# Patient Record
Sex: Female | Born: 1940
Health system: Southern US, Community
[De-identification: ages and names within clinical notes are randomized; demographics above are authoritative.]

## PROBLEM LIST (undated history)

## (undated) ENCOUNTER — Emergency Department (HOSPITAL_COMMUNITY): Admission: EM | Payer: Medicare Other | Source: Home / Self Care

## (undated) DIAGNOSIS — Z86010 Personal history of colonic polyps: Secondary | ICD-10-CM

## (undated) DIAGNOSIS — G4730 Sleep apnea, unspecified: Secondary | ICD-10-CM

## (undated) DIAGNOSIS — I499 Cardiac arrhythmia, unspecified: Secondary | ICD-10-CM

## (undated) DIAGNOSIS — R32 Unspecified urinary incontinence: Secondary | ICD-10-CM

## (undated) DIAGNOSIS — R51 Headache: Secondary | ICD-10-CM

## (undated) DIAGNOSIS — D649 Anemia, unspecified: Secondary | ICD-10-CM

## (undated) DIAGNOSIS — I509 Heart failure, unspecified: Secondary | ICD-10-CM

## (undated) DIAGNOSIS — E739 Lactose intolerance, unspecified: Secondary | ICD-10-CM

## (undated) DIAGNOSIS — G629 Polyneuropathy, unspecified: Secondary | ICD-10-CM

## (undated) DIAGNOSIS — E785 Hyperlipidemia, unspecified: Secondary | ICD-10-CM

## (undated) DIAGNOSIS — E119 Type 2 diabetes mellitus without complications: Secondary | ICD-10-CM

## (undated) DIAGNOSIS — E669 Obesity, unspecified: Secondary | ICD-10-CM

## (undated) DIAGNOSIS — M8580 Other specified disorders of bone density and structure, unspecified site: Secondary | ICD-10-CM

## (undated) DIAGNOSIS — M1990 Unspecified osteoarthritis, unspecified site: Secondary | ICD-10-CM

## (undated) DIAGNOSIS — M109 Gout, unspecified: Secondary | ICD-10-CM

## (undated) DIAGNOSIS — I7121 Aneurysm of the ascending aorta, without rupture: Secondary | ICD-10-CM

## (undated) DIAGNOSIS — Z9289 Personal history of other medical treatment: Secondary | ICD-10-CM

## (undated) DIAGNOSIS — G43909 Migraine, unspecified, not intractable, without status migrainosus: Secondary | ICD-10-CM

## (undated) DIAGNOSIS — H353190 Nonexudative age-related macular degeneration, unspecified eye, stage unspecified: Secondary | ICD-10-CM

## (undated) DIAGNOSIS — Z8701 Personal history of pneumonia (recurrent): Secondary | ICD-10-CM

## (undated) DIAGNOSIS — I2699 Other pulmonary embolism without acute cor pulmonale: Secondary | ICD-10-CM

## (undated) DIAGNOSIS — I10 Essential (primary) hypertension: Secondary | ICD-10-CM

## (undated) DIAGNOSIS — J189 Pneumonia, unspecified organism: Secondary | ICD-10-CM

## (undated) DIAGNOSIS — Z9889 Other specified postprocedural states: Secondary | ICD-10-CM

## (undated) DIAGNOSIS — K5100 Ulcerative (chronic) pancolitis without complications: Secondary | ICD-10-CM

## (undated) DIAGNOSIS — I480 Paroxysmal atrial fibrillation: Secondary | ICD-10-CM

## (undated) DIAGNOSIS — R0600 Dyspnea, unspecified: Secondary | ICD-10-CM

## (undated) DIAGNOSIS — R0602 Shortness of breath: Secondary | ICD-10-CM

## (undated) DIAGNOSIS — R112 Nausea with vomiting, unspecified: Secondary | ICD-10-CM

## (undated) DIAGNOSIS — I82409 Acute embolism and thrombosis of unspecified deep veins of unspecified lower extremity: Secondary | ICD-10-CM

## (undated) DIAGNOSIS — I4891 Unspecified atrial fibrillation: Secondary | ICD-10-CM

## (undated) DIAGNOSIS — R519 Headache, unspecified: Secondary | ICD-10-CM

## (undated) DIAGNOSIS — K219 Gastro-esophageal reflux disease without esophagitis: Secondary | ICD-10-CM

## (undated) HISTORY — DX: Ulcerative (chronic) pancolitis without complications: K51.00

## (undated) HISTORY — PX: COLONOSCOPY W/ BIOPSIES AND POLYPECTOMY: SHX1376

## (undated) HISTORY — DX: Personal history of colonic polyps: Z86.010

## (undated) HISTORY — DX: Other specified disorders of bone density and structure, unspecified site: M85.80

## (undated) HISTORY — PX: CARDIAC CATHETERIZATION: SHX172

## (undated) HISTORY — DX: Essential (primary) hypertension: I10

## (undated) HISTORY — PX: JOINT REPLACEMENT: SHX530

## (undated) HISTORY — DX: Acute embolism and thrombosis of unspecified deep veins of unspecified lower extremity: I82.409

## (undated) HISTORY — DX: Other pulmonary embolism without acute cor pulmonale: I26.99

## (undated) HISTORY — DX: Lactose intolerance, unspecified: E73.9

## (undated) HISTORY — DX: Polyneuropathy, unspecified: G62.9

## (undated) HISTORY — DX: Obesity, unspecified: E66.9

## (undated) HISTORY — DX: Gout, unspecified: M10.9

## (undated) HISTORY — PX: KNEE ARTHROSCOPY: SHX127

## (undated) HISTORY — DX: Hyperlipidemia, unspecified: E78.5

## (undated) HISTORY — PX: TOTAL KNEE ARTHROPLASTY: SHX125

---
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
## 1984-10-14 HISTORY — PX: ABDOMINAL HYSTERECTOMY: SHX81

## 2000-06-10 ENCOUNTER — Encounter (INDEPENDENT_AMBULATORY_CARE_PROVIDER_SITE_OTHER): Payer: Self-pay | Admitting: Specialist

## 2000-06-10 ENCOUNTER — Ambulatory Visit (HOSPITAL_COMMUNITY): Admission: RE | Admit: 2000-06-10 | Discharge: 2000-06-10 | Payer: Self-pay | Admitting: Gastroenterology

## 2000-07-11 ENCOUNTER — Encounter: Payer: Self-pay | Admitting: Internal Medicine

## 2000-07-11 ENCOUNTER — Encounter: Admission: RE | Admit: 2000-07-11 | Discharge: 2000-07-11 | Payer: Self-pay | Admitting: Internal Medicine

## 2001-07-10 ENCOUNTER — Encounter (INDEPENDENT_AMBULATORY_CARE_PROVIDER_SITE_OTHER): Payer: Self-pay | Admitting: *Deleted

## 2001-07-10 ENCOUNTER — Ambulatory Visit (HOSPITAL_COMMUNITY): Admission: RE | Admit: 2001-07-10 | Discharge: 2001-07-10 | Payer: Self-pay | Admitting: Gastroenterology

## 2001-07-30 ENCOUNTER — Encounter: Payer: Self-pay | Admitting: Internal Medicine

## 2001-07-30 ENCOUNTER — Encounter: Admission: RE | Admit: 2001-07-30 | Discharge: 2001-07-30 | Payer: Self-pay | Admitting: Internal Medicine

## 2002-11-22 ENCOUNTER — Encounter: Payer: Self-pay | Admitting: Internal Medicine

## 2002-11-22 ENCOUNTER — Encounter: Admission: RE | Admit: 2002-11-22 | Discharge: 2002-11-22 | Payer: Self-pay | Admitting: Internal Medicine

## 2003-11-23 ENCOUNTER — Encounter: Admission: RE | Admit: 2003-11-23 | Discharge: 2003-11-23 | Payer: Self-pay | Admitting: Internal Medicine

## 2003-11-25 ENCOUNTER — Encounter: Admission: RE | Admit: 2003-11-25 | Discharge: 2003-11-25 | Payer: Self-pay | Admitting: Internal Medicine

## 2003-12-20 ENCOUNTER — Inpatient Hospital Stay (HOSPITAL_COMMUNITY): Admission: RE | Admit: 2003-12-20 | Discharge: 2003-12-26 | Payer: Self-pay | Admitting: Orthopedic Surgery

## 2004-01-01 ENCOUNTER — Emergency Department (HOSPITAL_COMMUNITY): Admission: EM | Admit: 2004-01-01 | Discharge: 2004-01-01 | Payer: Self-pay | Admitting: Emergency Medicine

## 2004-01-16 ENCOUNTER — Encounter: Admission: RE | Admit: 2004-01-16 | Discharge: 2004-02-09 | Payer: Self-pay | Admitting: Orthopedic Surgery

## 2004-11-30 ENCOUNTER — Encounter: Admission: RE | Admit: 2004-11-30 | Discharge: 2004-11-30 | Payer: Self-pay | Admitting: Internal Medicine

## 2006-05-22 ENCOUNTER — Ambulatory Visit (HOSPITAL_COMMUNITY): Admission: RE | Admit: 2006-05-22 | Discharge: 2006-05-22 | Payer: Self-pay | Admitting: Internal Medicine

## 2007-02-03 ENCOUNTER — Encounter: Admission: RE | Admit: 2007-02-03 | Discharge: 2007-02-27 | Payer: Self-pay | Admitting: Orthopedic Surgery

## 2007-05-25 ENCOUNTER — Ambulatory Visit (HOSPITAL_COMMUNITY): Admission: RE | Admit: 2007-05-25 | Discharge: 2007-05-25 | Payer: Self-pay | Admitting: Internal Medicine

## 2008-05-26 ENCOUNTER — Ambulatory Visit (HOSPITAL_COMMUNITY): Admission: RE | Admit: 2008-05-26 | Discharge: 2008-05-26 | Payer: Self-pay | Admitting: Internal Medicine

## 2009-01-12 DIAGNOSIS — I82409 Acute embolism and thrombosis of unspecified deep veins of unspecified lower extremity: Secondary | ICD-10-CM

## 2009-01-12 DIAGNOSIS — I2699 Other pulmonary embolism without acute cor pulmonale: Secondary | ICD-10-CM

## 2009-01-12 HISTORY — DX: Acute embolism and thrombosis of unspecified deep veins of unspecified lower extremity: I82.409

## 2009-01-12 HISTORY — DX: Other pulmonary embolism without acute cor pulmonale: I26.99

## 2009-01-25 ENCOUNTER — Ambulatory Visit (HOSPITAL_COMMUNITY): Admission: RE | Admit: 2009-01-25 | Discharge: 2009-01-25 | Payer: Self-pay | Admitting: Interventional Cardiology

## 2009-02-06 ENCOUNTER — Encounter: Admission: RE | Admit: 2009-02-06 | Discharge: 2009-02-06 | Payer: Self-pay | Admitting: Internal Medicine

## 2009-02-06 ENCOUNTER — Inpatient Hospital Stay (HOSPITAL_COMMUNITY): Admission: EM | Admit: 2009-02-06 | Discharge: 2009-02-10 | Payer: Self-pay | Admitting: Emergency Medicine

## 2009-02-07 ENCOUNTER — Encounter (INDEPENDENT_AMBULATORY_CARE_PROVIDER_SITE_OTHER): Payer: Self-pay | Admitting: Internal Medicine

## 2009-05-29 ENCOUNTER — Ambulatory Visit (HOSPITAL_COMMUNITY): Admission: RE | Admit: 2009-05-29 | Discharge: 2009-05-29 | Payer: Self-pay | Admitting: Internal Medicine

## 2009-10-14 HISTORY — PX: SHOULDER OPEN ROTATOR CUFF REPAIR: SHX2407

## 2010-05-15 ENCOUNTER — Encounter: Admission: RE | Admit: 2010-05-15 | Discharge: 2010-07-12 | Payer: Self-pay | Admitting: Orthopedic Surgery

## 2010-09-03 ENCOUNTER — Ambulatory Visit (HOSPITAL_COMMUNITY): Admission: RE | Admit: 2010-09-03 | Discharge: 2010-09-03 | Payer: Self-pay | Admitting: Internal Medicine

## 2010-09-13 ENCOUNTER — Encounter: Admission: RE | Admit: 2010-09-13 | Discharge: 2010-09-13 | Payer: Self-pay | Admitting: Internal Medicine

## 2010-09-13 DIAGNOSIS — Z860100 Personal history of colon polyps, unspecified: Secondary | ICD-10-CM

## 2010-09-13 HISTORY — DX: Personal history of colon polyps, unspecified: Z86.0100

## 2010-09-13 HISTORY — DX: Personal history of colonic polyps: Z86.010

## 2010-09-27 ENCOUNTER — Ambulatory Visit (HOSPITAL_COMMUNITY)
Admission: RE | Admit: 2010-09-27 | Discharge: 2010-09-27 | Payer: Self-pay | Source: Home / Self Care | Attending: Gastroenterology | Admitting: Gastroenterology

## 2010-11-03 ENCOUNTER — Encounter: Payer: Self-pay | Admitting: Internal Medicine

## 2011-01-04 ENCOUNTER — Other Ambulatory Visit: Payer: Self-pay | Admitting: Orthopedic Surgery

## 2011-01-04 ENCOUNTER — Other Ambulatory Visit (HOSPITAL_COMMUNITY): Payer: Self-pay | Admitting: Orthopedic Surgery

## 2011-01-04 ENCOUNTER — Encounter (HOSPITAL_COMMUNITY): Payer: Medicare Other

## 2011-01-04 ENCOUNTER — Ambulatory Visit (HOSPITAL_COMMUNITY)
Admission: RE | Admit: 2011-01-04 | Discharge: 2011-01-04 | Disposition: A | Discharge status: Home / Self Care | Payer: Medicare Other | Source: Ambulatory Visit | Attending: Orthopedic Surgery | Admitting: Orthopedic Surgery

## 2011-01-04 DIAGNOSIS — I10 Essential (primary) hypertension: Secondary | ICD-10-CM

## 2011-01-04 DIAGNOSIS — I77819 Aortic ectasia, unspecified site: Secondary | ICD-10-CM | POA: Insufficient documentation

## 2011-01-04 DIAGNOSIS — Z86718 Personal history of other venous thrombosis and embolism: Secondary | ICD-10-CM | POA: Insufficient documentation

## 2011-01-04 DIAGNOSIS — Z01818 Encounter for other preprocedural examination: Secondary | ICD-10-CM | POA: Insufficient documentation

## 2011-01-04 LAB — APTT: aPTT: 32 seconds (ref 24–37)

## 2011-01-04 LAB — URINALYSIS, ROUTINE W REFLEX MICROSCOPIC
Ketones, ur: NEGATIVE mg/dL
Nitrite: NEGATIVE
Specific Gravity, Urine: 1.016 (ref 1.005–1.030)
Urobilinogen, UA: 0.2 mg/dL (ref 0.0–1.0)
pH: 5.5 (ref 5.0–8.0)

## 2011-01-04 LAB — PROTIME-INR: INR: 1.05 (ref 0.00–1.49)

## 2011-01-14 NOTE — H&P (Signed)
NAME:  Natasha Glass, Natasha Glass                ACCOUNT NO.:  [id number]  MEDICAL RECORD NO.:  [id number]          PATIENT TYPE:  INP  LOCATION:                               FACILITY:  [facility]  PHYSICIAN:  Pietro Cassis. Alvan Dame, M.D.  DATE OF BIRTH:  12-Dec-1940  DATE OF ADMISSION: DATE OF DISCHARGE:                             HISTORY & PHYSICAL   CHIEF COMPLAINT:  Osteoarthritis, right knee.  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  The patient now scheduled for total knee arthroplasty on the right knee.  Note that the patient has had a history of DVT with PE from the right leg in the past.  She is not a candidate for tranexamic acid.  PAST MEDICAL HISTORY:  Drug allergy to MORPHINE with extreme sedation, DIOVAN with cough, MACRODANTIN with rash and TETANUS with arm swelling.  CURRENT MEDICATIONS: 1. Metoprolol 50 mg daily. 2. Micardis 80 mg daily. 3. Allopurinol 300 mg daily. 4. Sulfasalazine 500 mg four tablets daily. 5. PreserVision two daily. 6. Lasix 40 mg one daily. 7. Vitamin D 2000 units daily. 8. Magnesium 250 mg daily. 9. Fish oil 1000 mg two daily. 10.Caltrate 600 plus D two daily.  PREVIOUS SURGERIES:  Left total knee arthroplasty in 2005, rotator cuff repair in 2011, hysterectomy, exploratory laparotomy for ulcerative colitis.  SERIOUS MEDICAL ILLNESSES:  Hypertension, colitis, gout, diet-controlled diabetes, and history of DVT with PE.  FAMILY HISTORY:  Positive for heart attack and cancer.  SOCIAL HISTORY:  The patient is widowed.  She lives a lone.  She does not smoke and does not drink.  REVIEW OF SYSTEMS:  CENTRAL NERVOUS SYSTEM:  Positive for migraines and occasional vertigo.  PULMONARY:  Positive for exertional shortness of breath, negative for PND and orthopnea,  negative for sleep apnea. CARDIOVASCULAR:  Positive for hypertension and history of DVT on the right leg with PE, negative for chest pain or palpitation.  GI: Positive for ulcerative colitis.  GU:  Negative for urinary tract difficulties.  MUSCULOSKELETAL:  Positive as in HPI with also degenerative disk disease of the lumbar spine and gout.  PHYSICAL EXAMINATION:  GENERAL:  This is a well-developed obese lady in no acute distress. VITAL SIGNS:  Blood pressure 128/84, respirations 18, pulse 68 and regular. HEENT:  Head normocephalic.  Nose patent.  Nares patent.  Pupils are equal, round, and reactive to light.  Throat without injection. NECK:  Supple without adenopathy.  Carotids are 2+ without bruit. CHEST:  Clear to auscultation.  No rales or rhonchi.  Respirations 18. HEART:  Regular rate and rhythm at 68 beats per minute without murmur. ABDOMEN:  Soft with active bowel sounds.  No masses or organomegaly. NEUROLOGIC:  The patient is alert and oriented to time, place, person. Cranial nerves II through XII grossly intact. EXTREMITIES:  Shows the left knee status post total knee arthroplasty with 0-118 degrees range of motion.  Right knee has a valgus deformity, crepitation throughout the  range of motion.  Neurovascular status intact.  There are no cords and a negative Homans sign today.  IMPRESSION:  Right knee osteoarthritis.  PLAN:  Right knee, total knee arthroplasty.  At this time, the patient states that she has arrange for friends to stay with her home postop, but she understands that she may need to go a nursing facility based on her recuperation.  Again, she is not a candidate for tranexamic acid.     Judith Part. Chabon, P.A.   ______________________________ Pietro Cassis Alvan Dame, M.D.    SJC/MEDQ  D:  12/25/2010  T:  12/26/2010  Job:  [id number]  Electronically Signed by Gerrit Halls P.A. on 01/14/2011 09:54:55 AM Electronically Signed by Paralee Cancel M.D. on 01/14/2011  12:15:04 PM

## 2011-01-15 ENCOUNTER — Inpatient Hospital Stay (HOSPITAL_COMMUNITY)
Admission: RE | Admit: 2011-01-15 | Discharge: 2011-01-19 | DRG: 470 | Disposition: A | Discharge status: Home Health | Payer: Medicare Other | Source: Ambulatory Visit | Attending: Orthopedic Surgery | Admitting: Orthopedic Surgery

## 2011-01-15 DIAGNOSIS — M109 Gout, unspecified: Secondary | ICD-10-CM | POA: Diagnosis present

## 2011-01-15 DIAGNOSIS — L03119 Cellulitis of unspecified part of limb: Secondary | ICD-10-CM | POA: Diagnosis not present

## 2011-01-15 DIAGNOSIS — L02419 Cutaneous abscess of limb, unspecified: Secondary | ICD-10-CM | POA: Diagnosis not present

## 2011-01-15 DIAGNOSIS — Z96659 Presence of unspecified artificial knee joint: Secondary | ICD-10-CM

## 2011-01-15 DIAGNOSIS — M1710 Unilateral primary osteoarthritis, unspecified knee: Principal | ICD-10-CM | POA: Diagnosis present

## 2011-01-15 DIAGNOSIS — Z86718 Personal history of other venous thrombosis and embolism: Secondary | ICD-10-CM

## 2011-01-15 DIAGNOSIS — I10 Essential (primary) hypertension: Secondary | ICD-10-CM | POA: Diagnosis present

## 2011-01-15 DIAGNOSIS — Z01812 Encounter for preprocedural laboratory examination: Secondary | ICD-10-CM

## 2011-01-15 DIAGNOSIS — E119 Type 2 diabetes mellitus without complications: Secondary | ICD-10-CM | POA: Diagnosis present

## 2011-01-15 DIAGNOSIS — M21069 Valgus deformity, not elsewhere classified, unspecified knee: Secondary | ICD-10-CM | POA: Diagnosis present

## 2011-01-15 DIAGNOSIS — Z79899 Other long term (current) drug therapy: Secondary | ICD-10-CM

## 2011-01-15 LAB — ABO/RH: ABO/RH(D): A POS

## 2011-01-15 LAB — TYPE AND SCREEN: ABO/RH(D): A POS

## 2011-01-16 LAB — GLUCOSE, CAPILLARY
Glucose-Capillary: 134 mg/dL — ABNORMAL HIGH (ref 70–99)
Glucose-Capillary: 125 mg/dL — ABNORMAL HIGH (ref 70–99)

## 2011-01-16 LAB — BASIC METABOLIC PANEL
BUN: 19 mg/dL (ref 6–23)
Calcium: 8 mg/dL — ABNORMAL LOW (ref 8.4–10.5)
Chloride: 105 mEq/L (ref 96–112)
Creatinine, Ser: 0.63 mg/dL (ref 0.4–1.2)
GFR calc Af Amer: >60 mL/min (ref >60)
GFR calc non Af Amer: >60 mL/min (ref >60)

## 2011-01-16 LAB — CBC
HCT: 32.6 % — ABNORMAL LOW (ref 36.0–46.0)
Platelets: 148 10*3/uL — ABNORMAL LOW (ref 150–400)
RDW: 15.7 % — ABNORMAL HIGH (ref 11.5–15.5)
WBC: 8.7 10*3/uL (ref 4.0–10.5)

## 2011-01-16 LAB — PROTIME-INR
INR: 1.1 (ref 0.00–1.49)
Prothrombin Time: 14.4 seconds (ref 11.6–15.2)

## 2011-01-17 LAB — CREATININE, SERUM
Creatinine, Ser: 0.6 mg/dL (ref 0.4–1.2)
GFR calc Af Amer: >60 mL/min (ref >60)
GFR calc non Af Amer: >60 mL/min (ref >60)

## 2011-01-17 LAB — GLUCOSE, CAPILLARY
Glucose-Capillary: 138 mg/dL — ABNORMAL HIGH (ref 70–99)
Glucose-Capillary: 60 mg/dL — ABNORMAL LOW (ref 70–99)
Glucose-Capillary: 151 mg/dL — ABNORMAL HIGH (ref 70–99)

## 2011-01-17 LAB — BASIC METABOLIC PANEL
CO2: 30 mEq/L (ref 19–32)
Chloride: 101 mEq/L (ref 96–112)
GFR calc Af Amer: >60 mL/min (ref >60)
Potassium: 4 mEq/L (ref 3.5–5.1)
Sodium: 136 mEq/L (ref 135–145)

## 2011-01-17 LAB — CBC
HCT: 35.2 % — ABNORMAL LOW (ref 36.0–46.0)
MCH: 26.8 pg (ref 26.0–34.0)
MCHC: 29.3 g/dL — ABNORMAL LOW (ref 30.0–36.0)
MCV: 91.4 fL (ref 78.0–100.0)
Platelets: 143 10*3/uL — ABNORMAL LOW (ref 150–400)
RDW: 15.8 % — ABNORMAL HIGH (ref 11.5–15.5)

## 2011-01-17 LAB — VANCOMYCIN, TROUGH
Vancomycin Tr: 32.5 ug/mL (ref 10.0–20.0)
Vancomycin Tr: 15.4 ug/mL (ref 10.0–20.0)

## 2011-01-17 LAB — PROTIME-INR
INR: 1.32 (ref 0.00–1.49)
Prothrombin Time: 16.6 seconds — ABNORMAL HIGH (ref 11.6–15.2)

## 2011-01-18 LAB — PROTIME-INR: Prothrombin Time: 19.7 seconds — ABNORMAL HIGH (ref 11.6–15.2)

## 2011-01-18 LAB — GLUCOSE, CAPILLARY: Glucose-Capillary: 122 mg/dL — ABNORMAL HIGH (ref 70–99)

## 2011-01-19 LAB — PROTIME-INR
INR: 1.97 — ABNORMAL HIGH (ref 0.00–1.49)
Prothrombin Time: 22.6 seconds — ABNORMAL HIGH (ref 11.6–15.2)

## 2011-01-19 LAB — GLUCOSE, CAPILLARY: Glucose-Capillary: 123 mg/dL — ABNORMAL HIGH (ref 70–99)

## 2011-01-19 NOTE — Op Note (Signed)
NAME:  Natasha Glass, Natasha Glass                ACCOUNT NO.:  [id number]  MEDICAL RECORD NO.:  [id number]           PATIENT TYPE:  I  LOCATION:  6767                         FACILITY:  [facility]  PHYSICIAN:  Pietro Cassis. Alvan Dame, M.D.  DATE OF BIRTH:  02-04-1941  DATE OF PROCEDURE:  01/15/2011 DATE OF DISCHARGE:                              OPERATIVE REPORT   PREOPERATIVE DIAGNOSIS:  Right knee osteoarthritis.  POSTOPERATIVE DIAGNOSES:  Right knee osteoarthritis, morbid obesity.  PROCEDURE:  Right total knee replacement.  COMPONENTS USED:  DePuy rotating platform posterior stabilized knee system with size 3 femur, 3 tibia, 15-mm insert, and 35 patellar button utilizing gentamicin-impregnated cement.  SURGEON:  Pietro Cassis. Alvan Dame, MD  ASSISTANT:  Gerrit Halls, PA-C  ANESTHESIA:  General.  BLOOD LOSS:  50 cc.  DRAINS:  One Hemovac.  SPECIMEN:  None.  COMPLICATIONS:  None.  TOURNIQUET TIME:  44 minutes at 250 mmHg.  INDICATION:  Natasha Glass is a 70 year old female patient of mine with history of left total knee replacement.  She had failed conservative measures on the right knee and wished to proceed with knee arthroplasty.  Risks and benefits reviewed, particularly related to her obesity and borderline diabetes, infection, DVT, component failure, need for revision surgery as well as a postop course expectations.  PROCEDURE IN DETAIL:  The patient was brought to operative theater. Once adequate anesthesia, preoperative antibiotics, vancomycin administered, she was positioned supine with a thigh tourniquet.  The right lower extremity was then prepped and draped in sterile fashion using DeMayo leg holder. Time-out was performed identifying the patient, planned procedure, and extremity.  Midline incision was made, sharp dissection was carried down to the extensor mechanism, and soft tissue plane was created.  A median arthrotomy was made following initial debridement and exposure.  Attention  was first directed to patella, precut measurement was only 21-22 mm.  I resected down to about 13 mm and used a 35 patellar button to restore height.  Lug holes were drilled and a metal shim was placed to protect the cut surface from retractors and saw blades.  Attention was now directed to the femur, the femoral canal was opened with a drill, irrigated to try to prevent fat emboli.  An intramedullary rod was passed at 3 degrees of valgus, I resected 10 mm of bone off the distal femur.  Following this resection, the tibia was subluxated anteriorly and further meniscus and cruciate stumps debrided. Using extramedullary guide, I measured resection of 2 mm off the lateral side, most affected side was carried out.  At this point, we confirmed the cut was perpendicular in the coronal plane using the alignment rod.  I sized the femur to be a size 3.  A size 3 rotation block was pinned into position anterior referenced off the anterior femur and pinned into position with the C clamp on the proximal tibia for rotation.  The 4:1 cutting block was then pinned into position, anterior and posterior chamfer cuts were then made without difficulty nor notching.  The final box cut was then made off the lateral aspect of distal femur. Tibia was then  subluxated anterior, the cut surface was best fit with a size 3 tibia, was pinned in position, drilled, and keel punched.  A trial reduction was carried out with a 3 femur, 3 tibia, and initially 12 insert.  I felt that it could probably could get a 15-mm insert as I did on her contralateral knee.  At this point, the trial components removed.  The synovial capsule junction of knee was injected with 0.25% Marcaine with epinephrine and 1 cc of Toradol.  The knee was irrigated with normal saline solution. Final components were opened and cement was mixed.  Final components were cemented onto clean and dried cut surfaces of bone.  The knee was brought to  extension with a 15-mm insert and held in extension until the cement cured.  Once the cement cured, excessive cement was removed throughout the knee and the final 15-mm insert was chosen and placed into the knee.  The knee was re-irrigated, the tourniquet was let down at 44 minutes without significant hemostasis.  Medium Hemovac drain was placed deep.  The extensor mechanism was then reapproximated using #1 Vicryl.  The remaining of the wound was closed with 2-0 Vicryl and staples on the skin.  Skin was cleaned, dried, and dressed sterilely with an Aquacel dressing and a bulky sterile wrap.  She was then brought to recovery room in stable condition.  Due to her significant obesity issue and preoperative lower extremity cellulitic change, I am going to keep her on IV vancomycin throughout her hospital stay and probably keep her on p.o. antibiotics for probably up to 4 weeks.     Pietro Cassis Alvan Dame, M.D.     MDO/MEDQ  D:  01/15/2011  T:  01/16/2011  Job:  [id number]  Electronically Signed by Paralee Cancel M.D. on 01/19/2011 10:01:50 AM

## 2011-01-21 NOTE — Discharge Summary (Signed)
NAME:  Natasha Glass, Natasha Glass                ACCOUNT NO.:  [id number]  MEDICAL RECORD NO.:  [id number]           PATIENT TYPE:  I  LOCATION:  0623                         FACILITY:  [facility]  PHYSICIAN:  Pietro Cassis. Alvan Dame, M.D.  DATE OF BIRTH:  1941-06-19  DATE OF ADMISSION:  01/15/2011 DATE OF DISCHARGE:  01/19/2011                              DISCHARGE SUMMARY   ADMITTING DIAGNOSIS:  Right knee osteoarthritis.  DISCHARGE DIAGNOSES: 1. Right knee osteoarthritis. 2. History of left total knee replacement for left knee arthritis. 3. Morbid obesity. 4. Hypertension. 5. Gout. 6. History of colitis. 7. History of cellulitis and venous lymphatic congestion. 8. History of deep vein thrombosis with pulmonary embolism.  BRIEF HISTORY:  Natasha Glass is a 70 year old patient of mine with history of advanced bilateral knee osteoarthritis.  She had a left total knee replacement done about 7 years ago that she has done very well with. She unfortunately has developed progressive problems with her right knee and been unable to exercise and continues to gain weight.  She is scheduled for right total knee replacement.  After reviewing risks and benefits particularly focussing on her size, increased risk of infection, difficulties with procedures and perioperative comorbidities, she is eager to proceed with this because of its success in her left knee and desire to improve her overall activity level and to lose weight.  HOSPITAL COURSE:  The patient was admitted for same-day surgery on January 15, 2011.  She underwent right total knee replacement.  Please see dictated operative note for full details of the procedure.  After routine stay in the recovery room, she was transferred to orthopedic floor where she remained for entire hospital stay.  Initially, she was placed on vancomycin as prophylactic antibiotics pre and perioperatively and kept on her IV vancomycin throughout the hospital stay for the diagnosis of  cellulitis no longer for prophylaxis from her knee surgery.  She was screened positive for MRSA.  On postoperative day #1, her Foley catheter was removed as was her Hemovac drain.  She was seen and evaluated by therapy and her overall initial therapy progress was pretty slow.  Preoperatively, her plan was to try to be discharged to home as she had done 7 years ago and wished to do this.  By postop day #2, her labs were stable with a hematocrit of 35.2 and stable electrolytes.  She still insisted to go home despite the fact that she was very limited in her mobility.  She was seen and evaluated on her postop day #3 on January 18, 2011 and was encouraged to be mobile in effort to try to get home.  By January 19, 2011, she was seen and evaluated.  She had increased her walking and ambulation enough to be able to go home with help from her family.  Her INR was 1.9.  She was placed on Coumadin postoperatively due to the increase risks of DVT and PE based on her medical history as well as the procedure performed.  She is also going to be kept on p.o. antibiotics for up to a month after her surgery due to  her cellulitis in the setting of acute knee replacement.  Her Lovenox discharged at time of discharge care.  DISCHARGE CONDITION:  She is stable at the time of discharge without complication.  DISCHARGE INSTRUCTIONS:  The patient will be discharged to home with home health physical therapy as well as nursing for management of her INR to keep an hr INR level between 2 and 2.5.  Her current Coumadin dose at discharge is 7.5 mg daily.  She will return to see Dr. Paralee Cancel at [facility] at [phone] in 2 weeks' time.  If there are any orthopedic questions, she should contact us.  She was discharged with Ace wraps to be wrapped around her feet and legs as slight compression.  She states she has compression stockings at home and she can transition to these as her leg swelling  goes down.  DISCHARGE MEDICATIONS:  Her discharge medications at this point will include: 1. Bactrim DS 1 tablet b.i.d. for 30 days. 2. Doxycycline 100 mg p.o. b.i.d. for 30 days. 3. Colace 100 mg p.o. b.i.d. while on pain medicine for constipation. 4. Iron 325 mg p.o. 2 to 3 times a day as needed for a week or two. 5. Norco 7.5/325 one to two tablets every 4 to 6 hours as needed for     pain. 6. Robaxin 500 mg p.o. q.6 h as needed for pain. 7. MiraLax 17 g p.o. once or twice a day as needed for constipation. 8. She is also instructed on the use of either a mag citrate or     suppositories at home for constipation. 9. Senokot-S 1 tablet q.h.s. as needed for constipation. 10.Coumadin 7.5 mg p.o. daily.  At this point, the dose may be     adjusted by home health pharmacy or our office to maintain an INR     between 2 and 2.5. 11.Allopurinol 300 mg p.o. q.h.s. 12.Aspirin 81 mg daily. 13.Calcium 600 mg p.o. daily. 14.Fish oil 1000 mg daily. 15.Lasix 40 mg q.a.m. 16.Hydrocodone as noted previously. 17.Magnesium oxide 400 mg daily. 18.Metoprolol 50 mg q.h.s. 19.Micardis 80 mg q.a.m. 20.PreserVision eye vitamin 2 capsules q.h.s. 21.Sulfasalazine 500 mg q.6 h as needed. 22.Vitamin D3 200 units daily. 23.Zyrtec 10 mg daily as needed.     Pietro Cassis Alvan Dame, M.D.     MDO/MEDQ  D:  01/19/2011  T:  01/19/2011  Job:  [id number]  Electronically Signed by Paralee Cancel M.D. on 01/21/2011 01:53:10 PM

## 2011-01-23 LAB — COMPREHENSIVE METABOLIC PANEL
AST: 17 U/L (ref 0–37)
BUN: 15 mg/dL (ref 6–23)
CO2: 25 mEq/L (ref 19–32)
Calcium: 8.8 mg/dL (ref 8.4–10.5)
Creatinine, Ser: 0.71 mg/dL (ref 0.4–1.2)
GFR calc Af Amer: >60 mL/min (ref >60)
GFR calc non Af Amer: >60 mL/min (ref >60)

## 2011-01-23 LAB — CBC
HCT: 34.3 % — ABNORMAL LOW (ref 36.0–46.0)
HCT: 35.3 % — ABNORMAL LOW (ref 36.0–46.0)
Hemoglobin: 11.3 g/dL — ABNORMAL LOW (ref 12.0–15.0)
MCHC: 32.8 g/dL (ref 30.0–36.0)
MCHC: 32.8 g/dL (ref 30.0–36.0)
MCV: 85.4 fL (ref 78.0–100.0)
MCV: 85.6 fL (ref 78.0–100.0)
MCV: 85.8 fL (ref 78.0–100.0)
MCV: 86.1 fL (ref 78.0–100.0)
Platelets: 200 10*3/uL (ref 150–400)
Platelets: 204 10*3/uL (ref 150–400)
Platelets: 216 10*3/uL (ref 150–400)
RBC: 4.01 MIL/uL (ref 3.87–5.11)
RBC: 4.04 MIL/uL (ref 3.87–5.11)
RBC: 4.1 MIL/uL (ref 3.87–5.11)
RBC: 4.16 MIL/uL (ref 3.87–5.11)
WBC: 10.1 10*3/uL (ref 4.0–10.5)
WBC: 10.9 10*3/uL — ABNORMAL HIGH (ref 4.0–10.5)
WBC: 8.8 10*3/uL (ref 4.0–10.5)

## 2011-01-23 LAB — GLUCOSE, CAPILLARY
Glucose-Capillary: 111 mg/dL — ABNORMAL HIGH (ref 70–99)
Glucose-Capillary: 112 mg/dL — ABNORMAL HIGH (ref 70–99)
Glucose-Capillary: 116 mg/dL — ABNORMAL HIGH (ref 70–99)
Glucose-Capillary: 116 mg/dL — ABNORMAL HIGH (ref 70–99)
Glucose-Capillary: 117 mg/dL — ABNORMAL HIGH (ref 70–99)
Glucose-Capillary: 117 mg/dL — ABNORMAL HIGH (ref 70–99)
Glucose-Capillary: 117 mg/dL — ABNORMAL HIGH (ref 70–99)
Glucose-Capillary: 118 mg/dL — ABNORMAL HIGH (ref 70–99)
Glucose-Capillary: 119 mg/dL — ABNORMAL HIGH (ref 70–99)
Glucose-Capillary: 94 mg/dL (ref 70–99)
Glucose-Capillary: 121 mg/dL — ABNORMAL HIGH (ref 70–99)

## 2011-01-23 LAB — PROTEIN C, TOTAL: Protein C, Total: 84 % (ref 70–140)

## 2011-01-23 LAB — PROTIME-INR
INR: 1 (ref 0.00–1.49)
INR: 1.1 (ref 0.00–1.49)
INR: 1.2 (ref 0.00–1.49)
Prothrombin Time: 13.4 seconds (ref 11.6–15.2)
Prothrombin Time: 14.5 seconds (ref 11.6–15.2)
Prothrombin Time: 15.3 seconds — ABNORMAL HIGH (ref 11.6–15.2)
Prothrombin Time: 19.4 seconds — ABNORMAL HIGH (ref 11.6–15.2)
Prothrombin Time: 22.4 seconds — ABNORMAL HIGH (ref 11.6–15.2)

## 2011-01-23 LAB — CARDIAC PANEL(CRET KIN+CKTOT+MB+TROPI)
CK, MB: 1.8 ng/mL (ref 0.3–4.0)
Relative Index: INVALID (ref 0.0–2.5)
Total CK: 34 U/L (ref 7–177)
Troponin I: <0.01 ng/mL (ref 0.00–0.06)

## 2011-01-23 LAB — LACTATE DEHYDROGENASE: LDH: 157 U/L (ref 94–250)

## 2011-01-23 LAB — LUPUS ANTICOAGULANT PANEL
DRVVT: 50.6 secs — ABNORMAL HIGH (ref 36.1–47.0)
Lupus Anticoagulant: NOT DETECTED

## 2011-01-23 LAB — PROTHROMBIN GENE MUTATION

## 2011-01-23 LAB — ANTITHROMBIN III: AntiThromb III Func: 98 % (ref 76–126)

## 2011-01-23 LAB — HEPARIN LEVEL (UNFRACTIONATED): Heparin Unfractionated: 0.83 IU/mL — ABNORMAL HIGH (ref 0.30–0.70)

## 2011-01-23 LAB — CARDIOLIPIN ANTIBODIES, IGG, IGM, IGA: Anticardiolipin IgA: 8 [APL'U] — ABNORMAL LOW (ref <13)

## 2011-01-23 LAB — BETA-2-GLYCOPROTEIN I ABS, IGG/M/A
Beta-2 Glyco I IgG: <4 U/mL (ref <20)
Beta-2-Glycoprotein I IgM: <4 U/mL (ref <10)

## 2011-01-23 LAB — HEMOGLOBIN A1C
Hgb A1c MFr Bld: 6.9 % — ABNORMAL HIGH (ref 4.6–6.1)
Mean Plasma Glucose: 151 mg/dL

## 2011-01-23 LAB — LIPID PANEL
LDL Cholesterol: 74 mg/dL (ref 0–99)
VLDL: 17 mg/dL (ref 0–40)

## 2011-01-23 LAB — BRAIN NATRIURETIC PEPTIDE: Pro B Natriuretic peptide (BNP): <30 pg/mL (ref 0.0–100.0)

## 2011-01-23 LAB — SEDIMENTATION RATE: Sed Rate: 14 mm/hr (ref 0–22)

## 2011-02-17 ENCOUNTER — Emergency Department (HOSPITAL_COMMUNITY)
Admission: EM | Admit: 2011-02-17 | Discharge: 2011-02-17 | Disposition: A | Discharge status: Home / Self Care | Payer: Medicare Other | Attending: Emergency Medicine | Admitting: Emergency Medicine

## 2011-02-17 ENCOUNTER — Emergency Department (HOSPITAL_COMMUNITY): Payer: Medicare Other

## 2011-02-17 DIAGNOSIS — R195 Other fecal abnormalities: Secondary | ICD-10-CM | POA: Insufficient documentation

## 2011-02-17 DIAGNOSIS — K5909 Other constipation: Secondary | ICD-10-CM | POA: Insufficient documentation

## 2011-02-17 DIAGNOSIS — E669 Obesity, unspecified: Secondary | ICD-10-CM | POA: Insufficient documentation

## 2011-02-17 DIAGNOSIS — K5190 Ulcerative colitis, unspecified, without complications: Secondary | ICD-10-CM | POA: Insufficient documentation

## 2011-02-17 DIAGNOSIS — E119 Type 2 diabetes mellitus without complications: Secondary | ICD-10-CM | POA: Insufficient documentation

## 2011-02-17 DIAGNOSIS — I10 Essential (primary) hypertension: Secondary | ICD-10-CM | POA: Insufficient documentation

## 2011-02-17 DIAGNOSIS — E785 Hyperlipidemia, unspecified: Secondary | ICD-10-CM | POA: Insufficient documentation

## 2011-02-26 NOTE — H&P (Signed)
NAME:  Natasha Glass, Natasha Glass                ACCOUNT NO.:  [id number]   MEDICAL RECORD NO.:  [id number]          PATIENT TYPE:  INP   LOCATION:  1188                         FACILITY:  [facility]   PHYSICIAN:  Farris Has, MDDATE OF BIRTH:  January 19, 1941   DATE OF ADMISSION:  02/06/2009  DATE OF DISCHARGE:                              HISTORY & PHYSICAL   PRIMARY CARE [place]:  Dwaine Deter, M.D.   CHIEF COMPLAINT:  Shortness of breath and right leg pain.   Patient is a 70 year old female with a history of diabetes, morbid  obesity, hypertension, who 3 weeks ago developed sudden onset of  shortness of breath but no chest pain.  Since then, she has undergone  coronary artery catheterization, which per patient, did not show any  lesion that was worth stenting.  I unfortunately do not have a report  from that.  It was done by Dr. Irish Lack.  She continued to have some  shortness of breath ever since Friday.  She started to have right foot  pain and perhaps some swelling.  This has persisted.  Over the weekend,  she thought at first that it was broken, and although she did not fall  or hit it in any way, but the pain was severe enough.  She presented to  Dr. Mertha Finders' office, and was seen by his 48 assistant,  who first obtained plain films which were unremarkable and obtained  Dopplers of her right leg, which showed the DVT.  Given her history of  shortness of breath, a CT scan was performed that showed bilateral PE's.  Unfortunately, I do not have a report here or in this system.   Patient denies any chest pain.  She did have a little episode of chills  but no fevers.  She does continue to feel some shortness of breath and  dyspnea on exertion but not orthopnea.   She denies any weight loss.  Her last colonoscopy has been 2 to 3 years  ago, and she has a history of ulcerative colitis and gets them  regularly.  Patient has been fairly mobile.  No recent travel.  No  significant risk factors for clotting disorder.  Otherwise, review of  systems unremarkable.   PAST MEDICAL HISTORY:  1. Obesity.  2. Hypertension.  3. Borderline diet-controlled diabetes, for which she is not taking      anything.  4. Hyperlipidemia.  5. Osteopenia.  6. Peripheral neuropathy.  7. Ulcerative colitis.   SOCIAL HISTORY:  Patient used to smoke remotely for about a year-long.  Does not abuse alcohol.  Does not drink or abuse drugs.  Lives at home  by herself.   FAMILY HISTORY:  Significant for coronary artery disease and diabetes in  her brother and father.  Mother died of colon cancer.   ALLERGIES:  Patient is allergic to MACRODANTIN, TETANUS, and MORPHINE.   MEDICATIONS:  1. Micardis 80/25 mg daily.  2. Azulfidine  100 mg p.o. every 6 hours.  1. Fish oil 500 mg twice daily.  2. Calcitrate.  3. Magnesium 250 daily.  4. Aspirin  81 mg 2 tabs daily.  5. Metoprolol.  It was actually stopped a while ago.  She is no longer      taking it.  6. Vitamin D.  7. Lasix 40 mg daily.  8. Zyrtec 10 mg daily.   PHYSICAL EXAMINATION:  VITALS:  Temperature 97.7, blood pressure 142/86,  pulse 99, respirations 22, satting 95% on room air.  Patient appears to be in no acute distress.  Somewhat obese.  Head is normocephalic.  Moist mucous membranes.  LUNGS:  Distant breath sounds but clear to auscultation bilaterally.  No  crackles or wheezes noted.  HEART:  Somewhat rapid but regular.  No murmurs appreciated.  ABDOMEN:  Obese but nontender.  Soft.  LOWER EXTREMITIES:  Bilateral edema, about 1+.  Maybe right slightly  more swollen than the left.  NEUROLOGIC:  Patient appears to be intact.  SKIN:  Clean, dry, and intact.   LABS:  White blood cell count 10.9, hemoglobin 12.6.  BNP not obtained.  Blood glucose 119.   CT scan, per report from the patient and from the note showing bilateral  pulmonary emboli.  Exact location or clot burden unclear.   Right leg Doppler  showing DVT of the left leg was not imaged.   EKG showing a normal sinus rhythm with S waves in lead 2 and 3.   ASSESSMENT/PLAN:  This is a 70 year old female with no known history of  clotting disorder, presents with new diagnosis of right leg deep venous  thrombosis and pulmonary embolus.  1. Deep venous thrombosis/pulmonary embolus, unclear etiology:  Given      age, would likely benefit from cancer workup.  She had been having      regular colonoscopy, but per her, may be due for one.  She has to      get them more frequently for her ulcerative colitis.  Will order      hypercoagulable panel but this probably not can be completely      accurate, as patient already was started on heparin.  I am not sure      if we can add it onto her labs.  Will check sed rate.  No dig.      Continue heparin drip.  Write for Coumadin, per pharmacy.  Once      imaging is reviewed, we can make a decision why she could be safely      transferred from Lovenox to heparin.  For prognostication purposes,      we will check troponin and 2D echo to evaluate for right-sided      strain, as this can worsen a prognosis.  2. History of hypertension:  Continue ARB with holding parameters.      Patient is not on Toprol anymore.  3. Diabetes, mostly diet controlled:  Will check hemoglobin A1C, put      her on sliding scale for now.  4. Obesity:  Stable.  5. History of ulcerative colitis:  Continue home meds.  6. Prophylaxis:  Protonix.  Patient is going to be on Coumadin and      currently on heparin.  For pain, we will give Percocet and a bowel      regimen as needed.   CODE STATUS:  Patient wished to be full code.      Farris Has, MD  Electronically Signed     AVD/MEDQ  D:  02/06/2009  T:  02/07/2009  Job:  [id number]   cc:   Nicholaus Bloom  Inda Merlin, M.D.

## 2011-02-26 NOTE — Cardiovascular Report (Signed)
Natasha Glass, Natasha Glass                ACCOUNT NO.:  [id number]   MEDICAL RECORD NO.:  [id number]          PATIENT TYPE:  OIB   LOCATION:  2899                         FACILITY:  [facility]   PHYSICIAN:  Jettie Booze, MDDATE OF BIRTH:  Jul 07, 1941   DATE OF PROCEDURE:  01/25/2009  DATE OF DISCHARGE:  01/25/2009                            CARDIAC CATHETERIZATION   REFERRING PHYSICIAN:  Dwaine Deter, MD   PROCEDURES PERFORMED:  Left heart catheterization, left ventriculogram,  coronary angiogram, abdominal aortogram.   OPERATOR:  Jettie Booze, MD   INDICATION:  Chest pain and shortness of breath along with multiple risk  factors for heart disease.   PROCEDURE NARRATIVE:  The risks and benefits of cardiac catheterization  were explained to the patient and informed consent was obtained.  She  was brought to the cath lab.  She was prepped and draped in the usual  sterile fashion.  Her right groin was infiltrated with 1% lidocaine.  A  6-French long sheath was placed into the right femoral artery using a  modified Seldinger technique.  Left coronary artery angiography was  performed using a JL-4 pigtail catheter.  The catheter was advanced to  the vessel, ostium under fluoroscopic guidance.  Digital angiography was  performed in multiple projections using hand injection of contrast.  Right coronary artery angiography was performed in a similar fashion  with a JR-4 catheter.  A pigtail catheter was advanced to the ascending  aorta and across the aortic valve under fluoroscopic guidance.  A power  injection of contrast was performed in the RAO projection to image the  left ventricle.  Catheter was pulled back under continuous hemodynamic  pressure monitoring.  Catheter was then withdrawn to the abdominal aorta  and a power injection contrast was performed in the AP projection to  image the abdominal aorta.  The sheath was removed using manual  compression.   FINDINGS:   Left main coronary artery was widely patent.   The left circumflex was a large vessel and appeared angiographically  normal.  The OM-1 and OM-2 were medium-sized vessels and widely patent.   The left anterior descending is a large vessel with mild-to-moderate  atherosclerosis proximally.  The vessel then decreases in size and wraps  around the apex.  The first diagonal is widely patent.   The right coronary artery is a large dominant vessel with mild  irregularities.  There is a small PDA that is widely patent.   Left ventriculogram showed normal left ventricular function with an  estimated ejection fraction of 55-60%.   HEMODYNAMICS:  Left ventricular pressure 100/6, LVEDP of 14 mmHg.  Aortic pressure 99/59 with a mean aortic pressure of 78 mmHg.  The  abdominal aortogram showed no abdominal aortic aneurysm.   IMPRESSION:  1. No significant coronary artery disease.  2. Normal left ventricular function.  3. No abdominal aortic aneurysm.  4. Essentially normal hemodynamics.   RECOMMENDATIONS:  Continue aggressive risk factor modification including  weight loss.  She should continue to try an exercise on a regular basis  and watch her diet.  Jettie Booze, MD  Electronically Signed     JSV/MEDQ  D:  01/25/2009  T:  01/26/2009  Job:  [id number]

## 2011-03-01 NOTE — Op Note (Signed)
NAME:  Natasha Glass, Natasha Glass                          ACCOUNT NO.:  [id number]   MEDICAL RECORD NO.:  [id number]                   PATIENT TYPE:  INP   LOCATION:  X003                                 FACILITY:  [facility]   PHYSICIAN:  Pietro Cassis. Alvan Dame, M.D.               DATE OF BIRTH:  1941-04-27   DATE OF PROCEDURE:  12/20/2003  DATE OF DISCHARGE:                                 OPERATIVE REPORT   PREOPERATIVE DIAGNOSIS:  End-stage left knee osteoarthritis.   POSTOPERATIVE DIAGNOSIS:  End-stage left knee osteoarthritis.   OPERATION/PROCEDURE:  Left total knee replacement.   COMPONENTS USED:  [place] [employer] rotating platform system with size  3 femur, size 3 tibia with a 15 mm polyethylene liner, 38 mm patella.   SURGEON:  Pietro Cassis. Alvan Dame, M.D.   ASSISTANT:  Pedro Earls, P.A.-C.   ANESTHESIA:  General.   ESTIMATED BLOOD LOSS:  200 mL.   FLUIDS REPLACED:  Two liters.   TOURNIQUET TIME:  70 minutes at 300 mmHg.   COMPLICATIONS:  None apparent.   DRAINS:  Two.   INDICATIONS FOR PROCEDURE:  Mrs. Crites is a pleasant 70 year old female who  has been followed for some time in clinic with painful bilateral lower  extremities, left side worse than right.  She has failed conservative  measures and despite reviewing risks and benefits of the procedure including  early clinical failure given her size, she wished to proceed with left total  knee replacement for intensive deep pain relief and maintain an active  lifestyle.  She consents to left total knee.   DESCRIPTION OF PROCEDURE:  The patient was brought to the operative theater.  Once adequate anesthesia and preoperative antibiotics were administered, 1 g  of Ancef, the patient was positioned supine.  A thigh tourniquet was placed.  The left lower extremity was then prepped and draped in the sterile fashion.   A midline incision was made and extensor mechanism was identified and medial  parapatellar arthrotomy was created.   Once the knee was exposed,  particularly the femur, the meniscus and cruciate ligaments were debrided.  Starting drill was placed and 10 mm of bone was resected at five degrees of  valgus to the left knee.  Sizing determined that the #3 tibia worked best.  From this, the anterior, posterior and chamfer cuts were made.  Box cut was  made as well.  Anterior and posterior cuts were revisited.   At this point attention was directed to the tibia.  Following subluxation of  the tibia anteriorly, intramedullary guide was placed.  Given the fact that  she had a neutral to valgus alignment, 8 mm of bone, which was taken off the  valgus side.  Radiographically, both the medial and lateral sides appeared  equal and 8 mm was resected off the high thigh on the lateral side.  The  bone was resected and sized and  a permanent size 3 tibia worked best.  Trial  reduction was carried out initially with pins followed by 12.5, then a 15 mm  poly.  With this the knee was stable on extension and with varus and valgus  stress ___________.  ____________ location was marked and the knee in full  extension.   At this point attention was directed to the patella.  Patella measurement  was 23 mm, approximately 9 mm bone which was taken from a 15 mm depth.  A  size 38 mm patella was clamped in place, drilled, and final reduction  carried out.  The patient was noted to be stable.  However, there was  significant tilt of the patella.  For this reason, external  lateral release  was carried out with maintenance of the synovial layer.  With this, the  patella tracked with only minimal pressure.   At this point trial components were removed and final components were  brought to the field.  The knee was copiously irrigated with pulse lavage  solution.  Once the cement was prepared, final components aired and the knee  dried, the tibial components cemented in position.  Excessive cement was  removed.  Note that the tibia  was prepared for a rotating platform tray at  the position previously noted with the knee in extension.  This allowed for  minimal rotation to the system.  Following cementing the tibia, the femoral  components cemented in position and a trial 15 mm cruciate retaining spacer  was placed.  The knee brought to extension with pressure across the joint.  The patella was cemented into position.  Once the cement had cured,  excessive cement was removed.  A trial component was removed and the knee  copiously irrigated with normal saline solution.  Excessive cement was  removed around the edges with an osteotome, suction and tonsil.  Final 15 mm  rotating platform tray was inserted into the patella for the tibia.  Knee  was stable with good tracking of the patella at this point.  The knee was  again irrigated, tourniquet let down after 70 minutes.  Hemostasis was  achieved other than bone ooze.  Following this, the extensor mechanism was  reapproximated using #1 PDS.  Deep subcutaneous fat layer was reapproximated  using #1 Vicryl, 2-0 Vicryl was used for the skin and 3-0 nylon was used at  the skin edges secondary to the nature and quality of the skin and secondary  to her size.  She received a second dose of Ancef when the tourniquet was  let down.  She otherwise tolerated the procedure without complications, was  transferred to the recovery room in stable condition.                                               Pietro Cassis Alvan Dame, M.D.    MDO/MEDQ  D:  12/20/2003  T:  12/20/2003  Job:  [id number]

## 2011-03-01 NOTE — Discharge Summary (Signed)
NAME:  Natasha Glass, Natasha Glass                          ACCOUNT NO.:  [id number]   MEDICAL RECORD NO.:  [id number]                   PATIENT TYPE:  INP   LOCATION:  8341                                 FACILITY:  [facility]   PHYSICIAN:  Pietro Cassis. Alvan Dame, M.D.               DATE OF BIRTH:  Feb 09, 1941   DATE OF ADMISSION:  12/20/2003  DATE OF DISCHARGE:  12/26/2003                                 DISCHARGE SUMMARY   ADMISSION DIAGNOSES:  1. Osteoarthritis of the left knee.  2. Ulcerative colitis.  3. Hypertension.   DISCHARGE DIAGNOSES:  1. Osteoarthritis of the left knee, status post left total knee replacement.  2. Ulcerative colitis.  3. Hypertension.  4. Postoperative hemorrhagic anemia, stable at the time of discharge.   PROCEDURE:  Patient was taken to the operating room on December 20, 2003 and  underwent a left total knee arthroplasty.  Surgeon was Dr. Paralee Cancel.  Assistant was [employer], [employer].  Surgery was done under general  anesthesia.  Hemovac drains x2 were placed at the time of surgery.   CONSULTS:  1. Physical therapy.  2. Occupational therapy.  3. Social Work/Case Management.  4. Rehabilitation with Dr. Theda Sers.   BRIEF HISTORY:  Patient is a 70 year old female followed closely by Dr.  Alvan Dame.  She is a previous patient of Dr. Laurence Slate with known diagnosis  of bilateral knee osteoarthritis.  Patient has previously undergone  conservative management with the use of Celebrex and injections to try to  prolong her knee surgery.  She has now gotten to the point where the  injections are not lasting her a long period of time when her knees are  bothering her, left worse than right.  She continues to have a significant  amount of discomfort, particularly with walking around.  She states that she  is ready to have surgery at this point due to her quality of life and  decrease in her daily activities.  X-rays in the office reveal end-stage  osteoarthritis of the left knee.   Dr. Alvan Dame felt it was best to proceed with  left total knee arthroplasty.  The patient agrees.  Risks and benefits of  the surgery were discussed with the patient, and the patient wished to  proceed.   LABORATORY DATA:  CBC on admission showed a hemoglobin of 12.9 and a  hematocrit of 39.2.  White blood cell count 9.4.  Red blood cell count 4.82.  H&H's were followed throughout the hospital stay.  Hemoglobin and hematocrit  did decline at 10.4 and 31.8 on December 23, 2003 but was stable at the time of  discharge.  Differential on admission was also within normal limits.  Coagulation studies on admission were also within normal limits.  PT/INR at  the time of discharge was 19.8 and 2.1, respectively on Coumadin therapy.  Repeat chemistry on admission showed a glucose high at 124 and  BUN slightly  high at 26.  Chemistries were followed throughout the hospital stay.  Glucose ranged from a low of 124 at the time of admission to a high of 173  on December 22, 2003.  Sodium did decline to 131 on December 22, 2003; however, is  back in the normal range at 136 on December 23, 2003.  The BUN did return to  normal on December 21, 2003 and remained normal throughout the hospital stay.  Urinalysis on admission showed specific gravity high at greater than 1.040  with a small amount of bilirubin.  Patient's blood type is A+ with antibody  screening negative.   EKG on admission showed normal sinus rhythm with nonspecific ST/T wave  changes.   Preop chest x-ray revealed cardiomegaly with upper-normal vascularity with  no active lung disease.  Postoperative x-rays of the left knee revealed  anatomic alignment, status post left total knee arthroplasty without acute  complicating features.   HOSPITAL COURSE:  The patient was admitted to [facility] and taken  to the operating room.  She underwent the above-stated procedure without  complications.  Patient tolerated the procedure well and left the recovery   room for the orthopedic floor to continue postoperative care.   On postoperative day #1, the patient was resting comfortably.  T max 102.  Hemoglobin and hematocrit were 11.0 and 33.6.  She is neurovascularly intact  to the left lower extremity.  Dressings are clean, dry, and intact.  Patient  worked with physical therapy and occupational therapy.  Hemovac was DC'd on  this day.   On postoperative day #2, December 22, 2003, patient is resting comfortably.  Hemoglobin and hematocrit of 10.5 and 32.0.  T max was 100.5.  She was  neurovascularly intact to the left lower extremity.  Incision was clean,  dry, and intact.  Dressing was changed today.  She is to continue working  with physical therapy and occupational therapy with the hope for rehab stay  in the next day or two.  PCA was DC'd on this day and Hep-Lock was also IV.   On December 23, 2003, postop day #3, the patient was resting comfortably with a  T max of 99.2.  Hemoglobin and hematocrit of 10.4 and 31.8.  Incision was  clean, dry, and intact.  Patient was to continue physical therapy and  occupational therapy.  IV was DC'd on this day.  She was hopeful for rehab  stay, starting over the next day or two, pending insurance clearance.   On December 24, 2003, postop day #4, patient had a temperature of 98.4.  Hemoglobin and hematocrit of 10.4 and 31.8.  She was neurovascularly intact  to the left lower extremity.   On December 25, 2003, postop day #5, the patient was resting comfortably.  She  was afebrile.  She was neurovascularly intact to the left lower extremity.  The incision was clean, dry, and intact.  Due to patient's progress and  insurance delay, the patient will be discharged on the following day after  PT session due to her marked progress with PT.   On December 26, 2003, the patient was doing well.  Was ready for discharge. The incision was clean, dry, and intact.  She was neurovascularly intact to  the left lower extremity.  The  patient was discharged home on this date.   DISPOSITION:  The patient was discharged home on December 26, 2003.   DISCHARGE MEDICATIONS:  1. Coumadin 5 mg 1  p.o. q.d.  2. Robaxin 500 mg 1 p.o. q.6-8h. p.r.n. spasms.  3. Darvocet-N 100 1-2 p.o. q.4-6h. p.r.n. pain.  4. Trinsicon caplets 1 p.o. t.i.d.   DIET:  As tolerated.   ACTIVITY:  Total knee precautions.  She will be weightbearing as tolerated.  [facility] for home care.   WOUND CARE:  Patient is to have daily dressing changes performed until no  drainage.  She may shower if no drainage.   FOLLOW UP:  Patient is to follow up with Dr. Alvan Dame in two weeks from the date  of surgery.  She is to call our office for an appointment at [phone].   CONDITION ON DISCHARGE:  Stable and improved.     Pedro Earls, P.A.-C.                   Pietro Cassis Alvan Dame, M.D.    SW/MEDQ  D:  01/13/2004  T:  01/14/2004  Job:  [id number]

## 2011-03-01 NOTE — Discharge Summary (Signed)
NAMERUDI, KNIPPENBERG                ACCOUNT NO.:  [id number]   MEDICAL RECORD NO.:  [id number]          PATIENT TYPE:  INP   LOCATION:  8003                         FACILITY:  [facility]   PHYSICIAN:  Dwaine Deter, M.D.DATE OF BIRTH:  Aug 04, 1941   DATE OF ADMISSION:  02/06/2009  DATE OF DISCHARGE:  02/10/2009                               DISCHARGE SUMMARY   DISCHARGE DIAGNOSES:  1. Right lower extremity deep venous thrombosis.  2. Bilateral pulmonary emboli secondary to right lower extremity deep      venous thrombosis.  3. Marked obesity.  4. Hypertension.  5. Borderline diet-controlled diabetes.  6. Hyperlipidemia.  7. History of osteopenia.  8. History of peripheral neuropathy.  9. History of ulcerative colitis.   DISCHARGE MEDICATIONS:  1. Coumadin 7.5 mg daily.  2. Micardis 80/25, one-half tablet daily.  3. Sulfasalazine 500 mg q.i.d.  4. Lasix 40 mg orally daily p.r.n. leg edema.  5. Darvocet-N 100 one p.o. q.6 h p.r.n. pain.  6. Aspirin 81 mg daily.  7. Caltrate 600 mg with vitamin D 1 p.o. daily.  8. Vitamin D 1000 international units daily.  9. Fish oil 1 g, 2 caplets daily.  10.Magnesium 400 mg daily.  11.Zyrtec 10 mg daily.  12.Lovenox 150 mg subcutaneously every 12 hours.  13.MiraLax 17 g in 8 ounces of water daily.  14.Percocet 5/325 mg 1 orally every 6 hours as needed for pain.   HOSPITAL COURSE:  Mrs. Natasha Glass is a very pleasant 70 year old  female with a history of morbid obesity, diabetes, and hypertension, who  3 weeks prior to admission developed sudden onset of shortness of breath  but no chest pain.  She underwent cardiac catheterization, which did not  show any significant coronary artery disease.  Performed by Dr. Casandra Doffing.  She continued to have shortness of breath and then started to  have right foot pain and some swelling, and the foot became so painful  that she thought it was broken though she had not fallen or traumatize  the foot  in anyway.  She presented to [facility].  She was  seen in my office, and she was found to have a right lower extremity DVT  on Doppler studies.  CT scan revealed bilateral pulmonary emboli, and  she was admitted for intravenous therapy and monitoring.   HOSPITAL COURSE:  She was given intravenous heparin and Coumadin was  started and hypertension and diabetes were monitored as well.  She was  started on Coumadin and monitored per pharmacy, and she was changed to  subcu Lovenox on February 08, 2009.  Blood pressure was monitored and  normal during the hospitalization.   Early ambulation was encouraged and by February 10, 2009, her INR was 1.9,  and she was discharged home on Lovenox 150 mg q.12 h and Coumadin 7.5 mg  daily and recheck INR was planned on Feb 13, 2009.  She was not short of  breath, having no chest pain, and ambulating well with normal vital  signs on February 10, 2009.   LABORATORIES AT DISCHARGE:  White  count 8800, hemoglobin 11.5, platelet  count 204,000.   LABORATORIES ON ADMISSION:  PT of 13.4 seconds, INR 1.0, PTT 27 seconds.  She did have a hypercoagulable profile drawn on admission revealing a  negative lupus anticoagulant.  Protein C and protein S assays were  normal as well.  Homocystine, which is borderline elevated at 15.6.   For diabetes, hemoglobin A1c was 6.9%, this was relatively normal.  Serial CK and serial cardiac enzymes were drawn from April 26 through  February 07, 2009, and were all normal.  Lipid profile on February 07, 2009,  was normal with a cholesterol of 136, triglycerides 84, HDL 45, and LDL  74 with a ratio of 3.0.  Anticardiolipin antibodies drawn on February 06, 2009, revealed a low IgG and IgM.  Beta 2-glycoprotein antibodies were  also negative.   The patient was discharged home in improved condition with stable vital  signs on February 10, 2009.  To continue Lovenox at home and have followup  PT/INR testing as above.      Dwaine Deter, M.D.  Electronically Signed     Dwaine Deter, M.D.  Electronically Signed    RNG/MEDQ  D:  03/08/2009  T:  03/09/2009  Job:  [id number]

## 2011-03-01 NOTE — H&P (Signed)
NAME:  Natasha Glass, MIS                          ACCOUNT NO.:  [id number]   MEDICAL RECORD NO.:  [id number]                   PATIENT TYPE:  INP   LOCATION:  NA                                   FACILITY:  [facility]   PHYSICIAN:  Pietro Cassis. Alvan Dame, M.D.               DATE OF BIRTH:  Jan 12, 1941   DATE OF ADMISSION:  DATE OF DISCHARGE:                                HISTORY & PHYSICAL   ANTICIPATED DATE OF ADMISSION:  December 20, 2003.   CHIEF COMPLAINT:  Left knee pain.   HISTORY OF PRESENT ILLNESS:  Ms. Natasha Glass is a 70 year old female who has been  followed closely by Dr. Alvan Dame.  She has previously seen Dr. Laurence Slate  with a diagnosis of bilateral knee osteoarthritis.  The patient has  previously undergone conservative management with the use of Celebrex and  injections to try to prolong her knee surgery.  She has now gotten to the  point where the injections are not lasting for a long period of time and her  knees are bothering her, left worse than right.  She continues to have a  significant amount of discomfort particularly with walking around.  She  states she is ready to have surgery at this point due to her quality of life  and her decrease in daily activities.  X-rays reveal an end-stage  osteoarthritis of the left knee.  Upon reviewing x-rays, Dr. Alvan Dame feels it  is best to proceed with surgery.  The patient agrees.  Risks and benefits of  the surgery have been discussed with the patient and the patient agrees.   PAST MEDICAL HISTORY:  Ulcerative colitis and hypertension.   PAST SURGICAL HISTORY:  Hysterectomy and exploratory laparotomy.   MEDICATIONS:  1. Folic acid 1 mg one p.o. daily.  2. Lasix 40 mg one p.o. daily p.r.n. lower extremity edema.  3. Diovan HCT 160/25 mg one p.o. daily.  4. Celebrex 200 mg one p.o. b.i.d. which she has been instructed to stop     taking.   ALLERGIES:  MACRODANTIN causes itching and edema, TETANUS.   SOCIAL HISTORY:  The patient denies any  tobacco or alcohol use.  She is a  widow.  She lives in a [place] house.   FAMILY HISTORY:  Father:  Coronary artery disease.  Mother:  Colon and lung  cancer.   REVIEW OF SYSTEMS:  GENERAL:  Denies fever, chills, night sweats, bleeding  tendencies.  CENTRAL NERVOUS SYSTEM:  Denies blurry or double vision,  seizures, headaches, paralysis.  RESPIRATORY:  Denies shortness of breath,  productive cough, hemoptysis.  CARDIOVASCULAR:  Denies chest pain, angina,  orthopnea.  GI:  Positive diarrhea.  Denies nausea, vomiting, constipation,  melena, bloody stools.  GU:  Denies dysuria, hematuria, or discharge.  MUSCULOSKELETAL:  Positive left lower extremity numbness and tingling.   PHYSICAL EXAMINATION:  GENERAL:  A well-developed, well-nourished 72-year-  old female.  HEENT:  Normocephalic, atraumatic.  Pupils equal, round, reactive to light.  NECK:  Supple.  No carotid bruit noted.  CHEST:  Clear to auscultation bilaterally.  No wheezes or crackles.  HEART:  Regular rate and rhythm.  No murmurs, rubs, or gallops.  ABDOMEN:  Soft, nontender, nondistended.  Positive bowel sounds x4.  EXTREMITIES:  Has painful range of motion of the left knee with a valgus  deformity.  She is neurovascularly intact distally to the left lower  extremity.  SKIN:  No rashes or lesion.   X-ray reveals end-stage osteoarthritis of the left knee.   IMPRESSION:  1. Osteoarthritis of the left knee.  2. Ulcerative colitis.  3. Hypertension.   PLAN:  The patient will be admitted to [facility] on December 20, 2003  to undergo a left total knee arthroplasty by Dr. Paralee Cancel.     Pedro Earls, P.A.-C.                   Pietro Cassis Alvan Dame, M.D.    SW/MEDQ  D:  12/15/2003  T:  12/15/2003  Job:  [id number]   cc:   Dwaine Deter, M.D.  [address]  [place] [address]  Fax: [phone]

## 2011-04-23 ENCOUNTER — Ambulatory Visit: Payer: Medicare Other | Attending: Physical Medicine and Rehabilitation | Admitting: Physical Therapy

## 2011-04-23 DIAGNOSIS — M542 Cervicalgia: Secondary | ICD-10-CM | POA: Insufficient documentation

## 2011-04-23 DIAGNOSIS — IMO0001 Reserved for inherently not codable concepts without codable children: Secondary | ICD-10-CM | POA: Insufficient documentation

## 2011-04-23 DIAGNOSIS — M6281 Muscle weakness (generalized): Secondary | ICD-10-CM | POA: Insufficient documentation

## 2011-04-23 DIAGNOSIS — R5381 Other malaise: Secondary | ICD-10-CM | POA: Insufficient documentation

## 2011-04-25 ENCOUNTER — Ambulatory Visit: Payer: Medicare Other | Admitting: *Deleted

## 2011-04-30 ENCOUNTER — Ambulatory Visit: Payer: Medicare Other | Admitting: Physical Therapy

## 2011-05-02 ENCOUNTER — Ambulatory Visit: Payer: Medicare Other | Admitting: Physical Therapy

## 2011-05-07 ENCOUNTER — Ambulatory Visit: Payer: Medicare Other | Admitting: Physical Therapy

## 2011-05-09 ENCOUNTER — Ambulatory Visit: Payer: Medicare Other | Admitting: Physical Therapy

## 2011-05-29 IMAGING — CR DG ABDOMEN 1V
3 series · 3 of 3 positions shown · non-contrast
Comparison: None.

CLINICAL DATA: Black stools and constipation.  Pain.

ABDOMEN - 1 VIEW

[t abdomen supine * (1 of 3)]
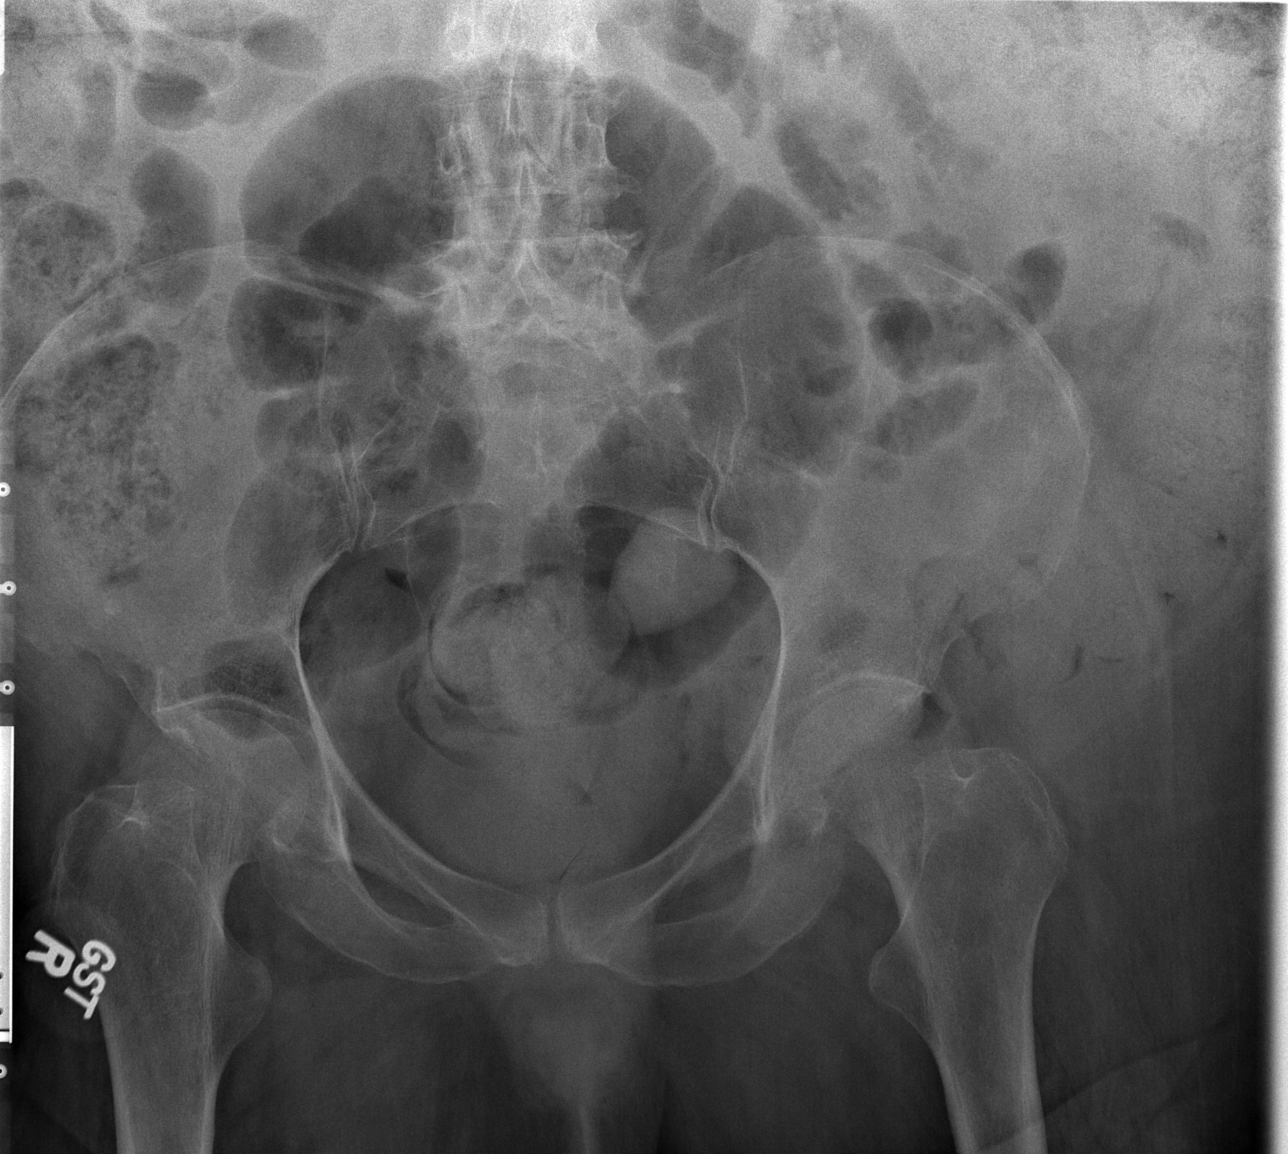

[t abdomen supine * (2 of 3)]
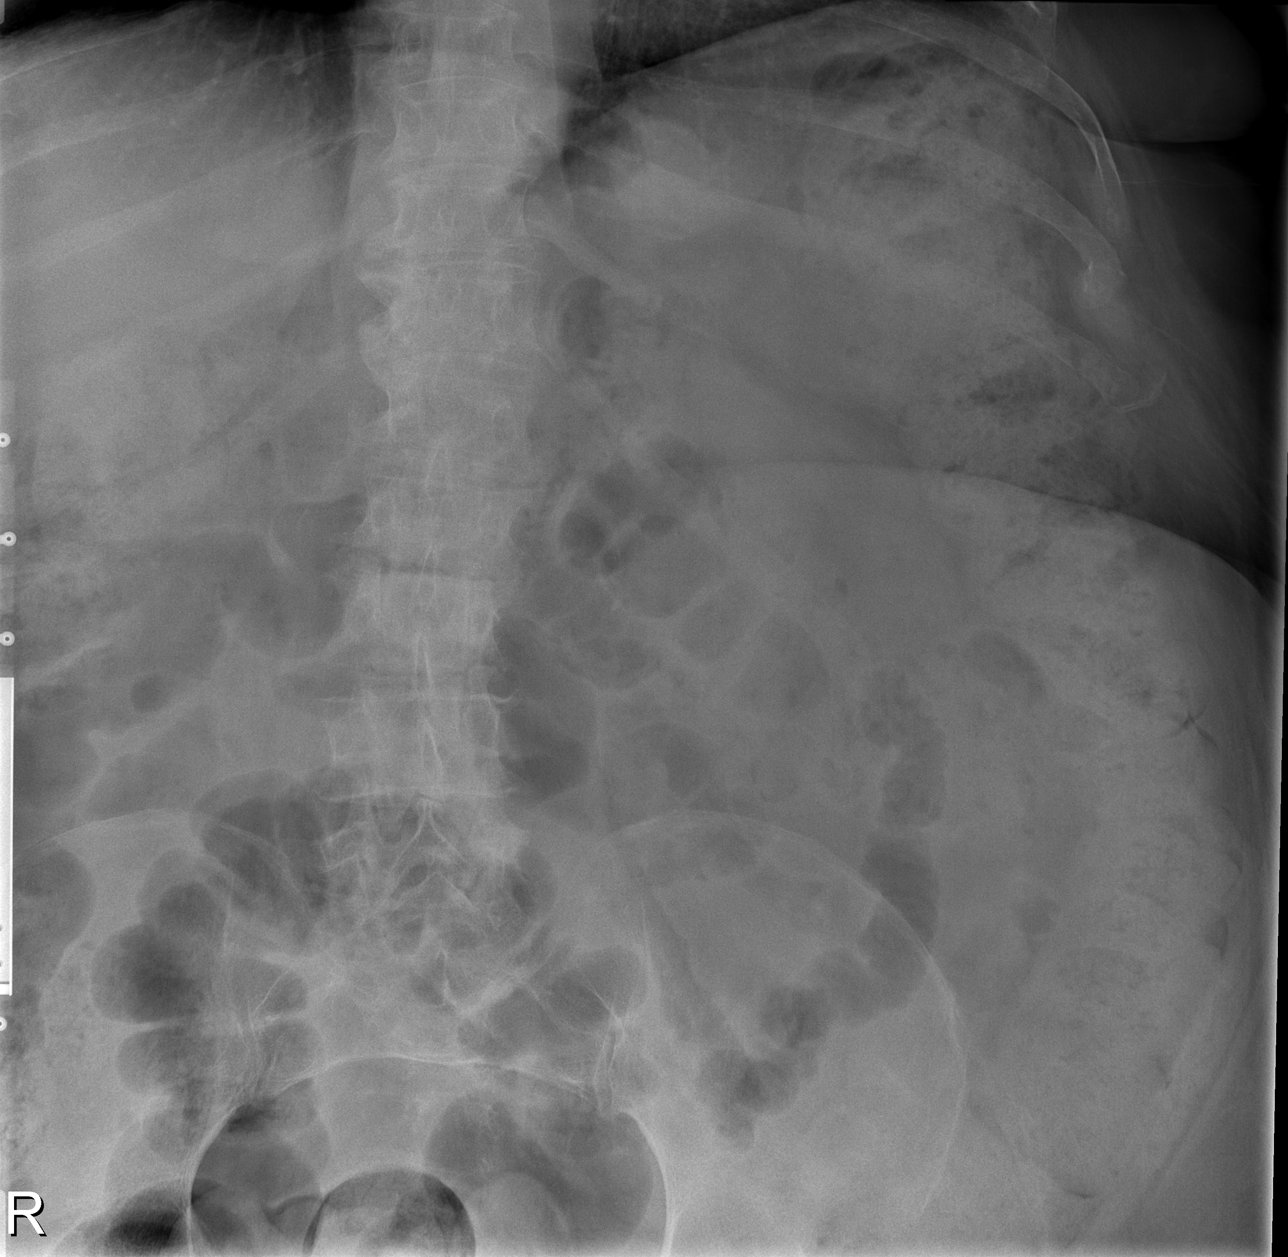

[t abdomen supine * (3 of 3)]
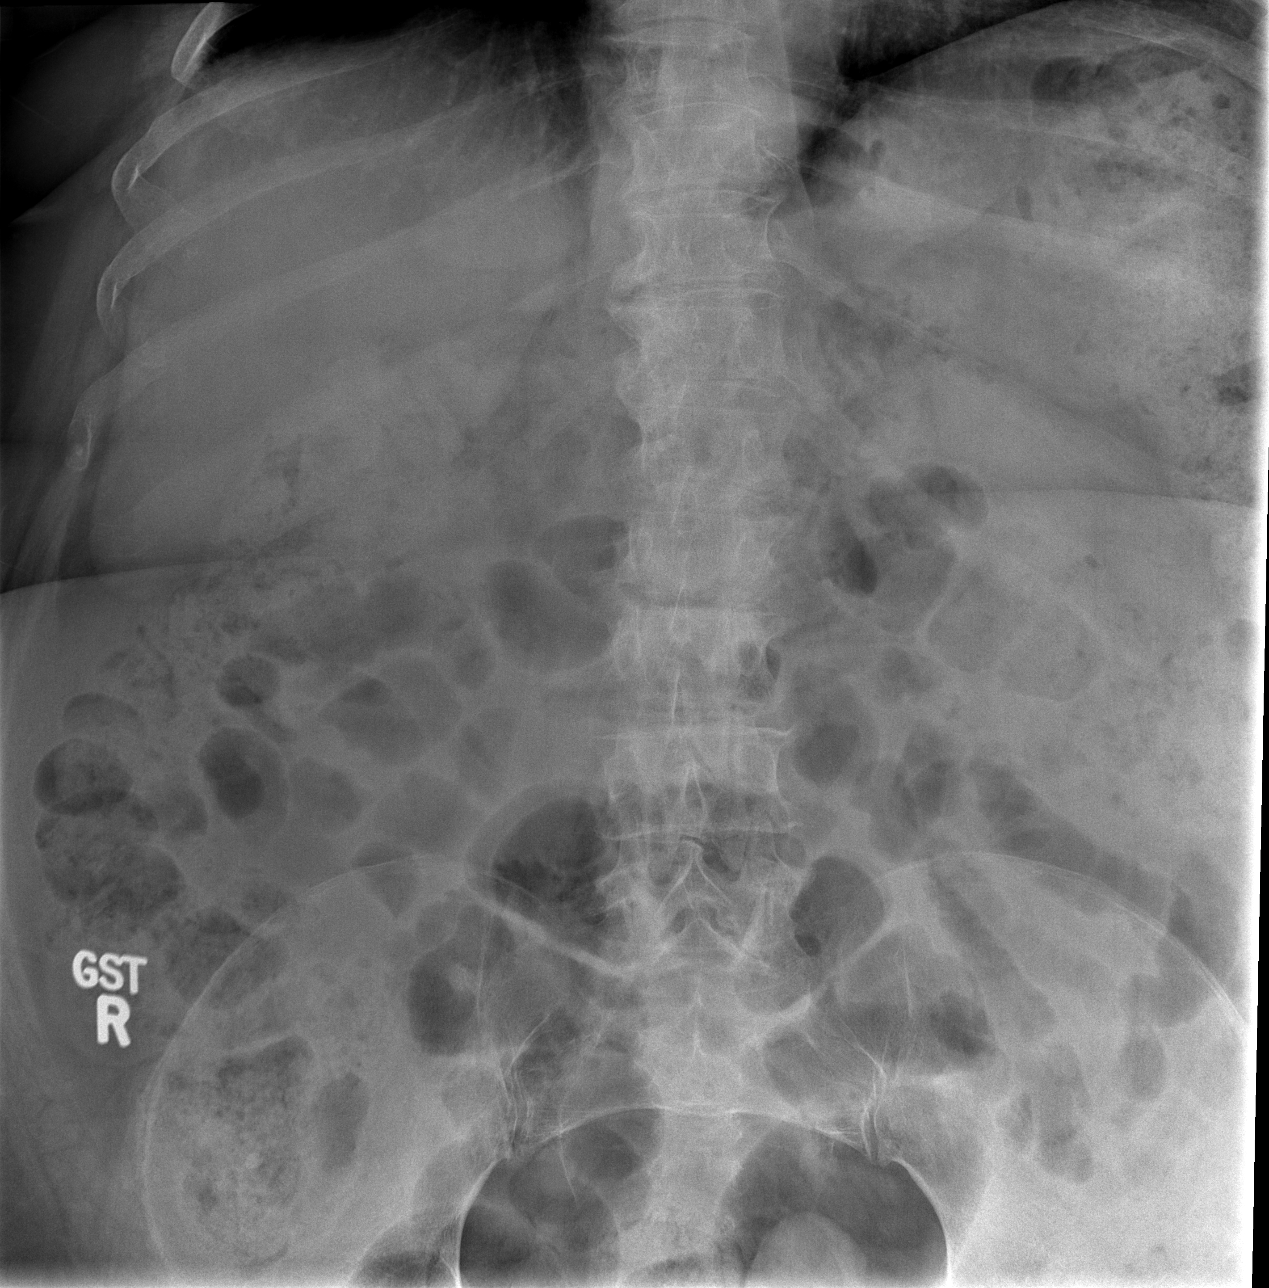

[3 of 3 positions shown; findings below may reference images not displayed]

FINDINGS: Stool throughout the colon.  Gas filled nondistended
small bowel.  Changes may be consistent with clinical constipation.
No radiopaque stones.  Degenerative changes in the spine.
IMPRESSION: Stool filled colon.  Nonobstructive bowel gas pattern.

## 2011-07-08 ENCOUNTER — Encounter (INDEPENDENT_AMBULATORY_CARE_PROVIDER_SITE_OTHER): Payer: Medicare Other | Admitting: Ophthalmology

## 2011-07-08 DIAGNOSIS — H43819 Vitreous degeneration, unspecified eye: Secondary | ICD-10-CM

## 2011-07-08 DIAGNOSIS — H35039 Hypertensive retinopathy, unspecified eye: Secondary | ICD-10-CM

## 2011-07-08 DIAGNOSIS — E11319 Type 2 diabetes mellitus with unspecified diabetic retinopathy without macular edema: Secondary | ICD-10-CM

## 2011-07-08 DIAGNOSIS — H3530 Unspecified macular degeneration: Secondary | ICD-10-CM

## 2011-08-06 ENCOUNTER — Other Ambulatory Visit (HOSPITAL_COMMUNITY): Payer: Self-pay | Admitting: Internal Medicine

## 2011-08-06 DIAGNOSIS — Z1231 Encounter for screening mammogram for malignant neoplasm of breast: Secondary | ICD-10-CM

## 2011-09-06 ENCOUNTER — Ambulatory Visit (HOSPITAL_COMMUNITY)
Admission: RE | Admit: 2011-09-06 | Discharge: 2011-09-06 | Disposition: A | Discharge status: Home / Self Care | Payer: Medicare Other | Source: Ambulatory Visit | Attending: Internal Medicine | Admitting: Internal Medicine

## 2011-09-06 DIAGNOSIS — Z1231 Encounter for screening mammogram for malignant neoplasm of breast: Secondary | ICD-10-CM | POA: Insufficient documentation

## 2011-10-01 ENCOUNTER — Other Ambulatory Visit: Payer: Self-pay | Admitting: Gastroenterology

## 2012-02-18 ENCOUNTER — Other Ambulatory Visit: Payer: Self-pay | Admitting: Gastroenterology

## 2012-05-06 ENCOUNTER — Encounter (INDEPENDENT_AMBULATORY_CARE_PROVIDER_SITE_OTHER): Payer: Self-pay | Admitting: General Surgery

## 2012-05-07 ENCOUNTER — Ambulatory Visit (INDEPENDENT_AMBULATORY_CARE_PROVIDER_SITE_OTHER): Payer: Medicare Other | Admitting: General Surgery

## 2012-05-07 ENCOUNTER — Encounter (INDEPENDENT_AMBULATORY_CARE_PROVIDER_SITE_OTHER): Payer: Self-pay | Admitting: General Surgery

## 2012-05-07 ENCOUNTER — Telehealth (INDEPENDENT_AMBULATORY_CARE_PROVIDER_SITE_OTHER): Payer: Self-pay | Admitting: General Surgery

## 2012-05-07 VITALS — BP 130/80 | HR 76 | Temp 97.5°F | Resp 16 | Ht 63.0 in | Wt 316.0 lb

## 2012-05-07 DIAGNOSIS — K5190 Ulcerative colitis, unspecified, without complications: Secondary | ICD-10-CM | POA: Insufficient documentation

## 2012-05-07 DIAGNOSIS — E669 Obesity, unspecified: Secondary | ICD-10-CM

## 2012-05-07 DIAGNOSIS — I10 Essential (primary) hypertension: Secondary | ICD-10-CM

## 2012-05-07 DIAGNOSIS — Z86711 Personal history of pulmonary embolism: Secondary | ICD-10-CM | POA: Insufficient documentation

## 2012-05-07 NOTE — Patient Instructions (Signed)
We have discussed your ulcerative colitis and the recent colonoscopy which shows an area of adenomatous change or possible low-grade dysplasia on the left side.. We have discussed surgical options. We have discussed the risk and benefits of surgery. We have discussed the risks and benefits of surveillance, or watchful waiting.  At this time we have decided that the risk of surgery is significantly greater than the risk of observation.  Dr. Dalbert Batman recommends that you have a followup colonoscopy in one year with Dr. Wynetta Emery.  Dr. Dalbert Batman will continue to follow you, return to see Dr. Dalbert Batman in one year after her colonoscopy.

## 2012-05-07 NOTE — Progress Notes (Signed)
Patient ID: Natasha Glass, female   DOB: August 24, 1941, 71 y.o.   MRN: [id number]  Chief Complaint  Patient presents with  . Ulcerative Colitis    HPI Natasha Glass is a 71 y.o. female.      She is referred by Dr. Earle Gell for a surgical opinion regarding her ulcerative colitis.  This very pleasant woman states that she was diagnosed with ulcer colitis in 1961. She was in her 25s. She was having bloody diarrhea to the degree that she had to have blood transfusions. She also states that she has a quarter laparotomy because of bloody diarrhea at age 41 and she thinks that they diagnosed ulcerative colitis that time. The only other abdominal operation she's had is an abdominal hysterectomy and bilateral salpingo-oophorectomy for benign disease.  Currently her symptoms are much better. She is stable on Azulfidine. She doesn't have any abdominal pain. She only rarely sees blood but occasionally she will see a little. She has one or 2 soft somewhat mushy bowel movements a day. Never constipated. Never diarrhea. She is morbidly obese  and her weight has been stable the last 2 or 3 years.  She apparently gets colonoscopies periodically for surveillance. Recent colonoscopy revealed apparently normal colorectal mucosa except for one 5 mm sessile polyp in the proximal sigmoid colon. 5 separate areas were biopsied for surveillance. They were all completely normal with benign-looking colorectal mucosa except for one area in the descending colon which showed some fragments of adenomatous change. This was felt to be consistent either with a tubular adenoma or low-grade dysplasia.  Dr. Wynetta Emery wanted her to to be evaluated surgically to see if she should have a prophylactic colectomy to lower her risk of colorectal cancer. It is notable that the patient's mother had colon cancer. Diagnosis at 92 with rectal bleeding. She did have a colon cancer and lung cancer and survived only 8 months after that. The patient  does not think the mother had colitis.  This patient has significant comorbidities including morbid obesity BMI of 55.9, hypertension, history of pulmonary embolism 3 years ago treated with Coumadin for 12 months, and significant osteoarthritis. Her performance status is not very good but she can ambulate with a cane and she can drive a car. She has difficulty getting up on the exam table. She has had bilateral total knee replacements. She doesn't have any pulmonary disease. She has never had a stroke or MI. She is followed by Dr. Mertha Finders for primary care.                                       HPI  Past Medical History  Diagnosis Date  . Hyperlipidemia   . Hypertension   . Diabetes mellitus     boarderline  . Obesity   . Osteopenia   . Peripheral neuropathy   . Ulcerative colitis   . Pulmonary embolism     righ lower extremity  . DVT (deep venous thrombosis) 01/2009  . Shortness of breath   . Gout   . History of colonic polyps 09/2010  . Lactose intolerance   . Universal ulcerative (chronic) colitis     Past Surgical History  Procedure Date  . Abdominal hysterectomy 1986  . Knee surgery 2005    left knee  . Colonoscopy     with polyp resection  . Knee surgery 2012    right  knee  . Rotator cuff repair 2011    left   . Abdominal exploration surgery 1960    Family History  Problem Relation Age of Onset  . Colon cancer Mother   . Lung cancer Mother   . Heart disease Father     Social History History  Substance Use Topics  . Smoking status: Never Smoker   . Smokeless tobacco: Never Used  . Alcohol Use: No    Allergies  Allergen Reactions  . Amlodipine Swelling    Of legs  . Tramadol Swelling    Of legs  . Diphtheria Toxoid   . Macrodantin (Nitrofurantoin Macrocrystal)   . Morphine And Related   . Tetanus Toxoids   . Diovan (Valsartan) Other (See Comments)    Cough    Current Outpatient Prescriptions  Medication Sig Dispense Refill  . aspirin 81 MG  tablet Take 81 mg by mouth daily.      . calcium carbonate (OS-CAL) 600 MG TABS Take 600 mg by mouth daily.      . Cholecalciferol (VITAMIN D) 2000 UNITS tablet Take 2,000 Units by mouth daily.      . furosemide (LASIX) 40 MG tablet Take 40 mg by mouth every other day.      . losartan (COZAAR) 100 MG tablet Take 100 mg by mouth daily.      . LUTEIN PO Take by mouth as needed. Preser Vision - brand name      . Magnesium 250 MG TABS Take by mouth daily.      . metoprolol tartrate (LOPRESSOR) 25 MG tablet Take 25 mg by mouth 2 (two) times daily.      . Omega-3 Fatty Acids (FISH OIL) 1000 MG CAPS Take by mouth 2 (two) times daily.      . potassium chloride SA (K-DUR,KLOR-CON) 20 MEQ tablet Take 20 mEq by mouth daily.      Marland Kitchen sulfaSALAzine (AZULFIDINE) 500 MG tablet Take 500 mg by mouth 4 (four) times daily.      . cetirizine (ZYRTEC) 10 MG tablet Take 10 mg by mouth as needed.      . nitroGLYCERIN (NITRODUR - DOSED IN MG/24 HR) 0.4 mg/hr Place 1 patch onto the skin as needed.        Review of Systems Review of Systems  Constitutional: Positive for fatigue. Negative for fever, chills and unexpected weight change.  HENT: Negative for hearing loss, congestion, sore throat, trouble swallowing and voice change.   Eyes: Negative for visual disturbance.  Respiratory: Positive for chest tightness. Negative for cough and wheezing.   Cardiovascular: Positive for leg swelling. Negative for chest pain and palpitations.  Gastrointestinal: Positive for blood in stool. Negative for nausea, vomiting, abdominal pain, diarrhea, constipation, abdominal distention and anal bleeding.  Genitourinary: Negative for hematuria, vaginal bleeding and difficulty urinating.  Musculoskeletal: Positive for myalgias, joint swelling, arthralgias and gait problem.  Skin: Negative for rash and wound.  Neurological: Positive for light-headedness. Negative for seizures, syncope and headaches.  Hematological: Negative for  adenopathy. Does not bruise/bleed easily.  Psychiatric/Behavioral: Negative for confusion.    Blood pressure 130/80, pulse 76, temperature 97.5 F (36.4 C), temperature source Temporal, resp. rate 16, height 5' 3"  (1.6 m), weight 316 lb (143.337 kg).  Physical Exam Physical Exam  Constitutional: She is oriented to person, place, and time. She appears well-developed and well-nourished. No distress.       BMI 55.98  HENT:  Head: Normocephalic and atraumatic.  Nose: Nose normal.  Mouth/Throat:  No oropharyngeal exudate.  Eyes: Conjunctivae and EOM are normal. Pupils are equal, round, and reactive to light. Left eye exhibits no discharge. No scleral icterus.  Neck: Neck supple. No JVD present. No tracheal deviation present. No thyromegaly present.  Cardiovascular: Normal rate, regular rhythm, normal heart sounds and intact distal pulses.   No murmur heard.      Was transiently dizzy upon assuming correct posture after supply and physical exam.  Pulmonary/Chest: Effort normal and breath sounds normal. No respiratory distress. She has no wheezes. She has no rales. She exhibits no tenderness.  Abdominal: Soft. Bowel sounds are normal. She exhibits no distension and no mass. There is no tenderness. There is no rebound and no guarding.       Morbidly obese. Soft and nontender. Not distended. Lower midline scar from laparotomy and and is still scar from hysterectomy. No mass. No hernia.  Genitourinary:       Rectal not performed.  Musculoskeletal: She exhibits edema. She exhibits no tenderness.       Significant lower extremity edema bilaterally. No ulcerations. Chronic venous stasis changes. Significant gait problem. Required a fair amount of assistance to step up on the exam table. Uses a cane.  Lymphadenopathy:    She has no cervical adenopathy.  Neurological: She is alert and oriented to person, place, and time. She exhibits normal muscle tone. Coordination normal.  Skin: Skin is warm. No rash  noted. She is not diaphoretic. No erythema. No pallor.  Psychiatric: She has a normal mood and affect. Her behavior is normal. Judgment and thought content normal.    Data Reviewed I have reviewed her recent colonoscopy report, Dr. Durenda Age office notes, and I have reviewed the pathology report in detail. I have called Dr. Durenda Age office requesting a return call to discuss the case.  Assessment    Chronic ulcerative colitis, minimally symptomatic and stable for over 4 decades on Azulfidine.  Recent colonoscopy revealing question of low-grade dysplasia versus adenomatous change in the rectosigmoid area.  Family history of colon cancer in the mother  Morbid obesity  History of pulmonary embolism 2010  Hypertension  Status post total abdominal hysterectomy and bilateral salpingo-oophorectomy    Plan    We had a very long talk regarding the philosophy of surgical management of ulcerative colitis. The focus of the conversation was on options for prophylactic surgery to lower the risk of colon cancer. I told her that her cumulative risk of developing colorectal cancer was at least 10%, possibly higher considering her family history. I discussed several surgical options including:     A) total proctocolectomy with Brooke ileostomy,      b)  total colectomy with ileorectal anastomosis,     c)total proctocolectomy with continent ileostomy, and    ileal pouch anal anastomosis. I told her that because of her significant comorbidities she was probably not a candidate for continent ileostomy or ileal pouch-anal anastomosis.  Told her that the options at this point are total proctocolectomy with ileostomy, total colectomy with ileorectal anastomosis, or surveillance. We talked about the benefits of all these approaches as well as the risks.  At the end of the conversation it was my opinion that her comorbidities and the risk of surgery outweighed the risk of surveillance at this point in time. Her  goals are the same, meaning that she simply wants to focus of quality of life, lose weight and avoid surgery unless this is absolutely necessary. She is willing to accept the low but  definite risk that she may develop colon cancer and need an operation later.  I am going to discuss her care with Dr. Earle Gell to be sure that we agree in this decision-making.  I recommend that she have a repeat colonoscopy in one year  I will see her back in one year after she gets her colonoscopy.       Edsel Petrin. Dalbert Batman, M.D., [facility] General and Minimally invasive Surgery Breast and Colorectal Surgery Office:   [phone] Pager:   [phone]  05/07/2012, 10:22 AM

## 2012-05-07 NOTE — Telephone Encounter (Signed)
Called [facility] spoke with Mardene Celeste in medical records. Requested a copy of colonoscopy results from test performed on 02/18/12.

## 2012-05-08 ENCOUNTER — Encounter (INDEPENDENT_AMBULATORY_CARE_PROVIDER_SITE_OTHER): Payer: Self-pay

## 2012-07-07 ENCOUNTER — Encounter (INDEPENDENT_AMBULATORY_CARE_PROVIDER_SITE_OTHER): Payer: Medicare Other | Admitting: Ophthalmology

## 2012-07-07 DIAGNOSIS — H35039 Hypertensive retinopathy, unspecified eye: Secondary | ICD-10-CM

## 2012-07-07 DIAGNOSIS — H43819 Vitreous degeneration, unspecified eye: Secondary | ICD-10-CM

## 2012-07-07 DIAGNOSIS — H3530 Unspecified macular degeneration: Secondary | ICD-10-CM

## 2012-07-07 DIAGNOSIS — E11319 Type 2 diabetes mellitus with unspecified diabetic retinopathy without macular edema: Secondary | ICD-10-CM

## 2012-07-07 DIAGNOSIS — I10 Essential (primary) hypertension: Secondary | ICD-10-CM

## 2012-07-07 DIAGNOSIS — H2510 Age-related nuclear cataract, unspecified eye: Secondary | ICD-10-CM

## 2012-07-07 DIAGNOSIS — E1139 Type 2 diabetes mellitus with other diabetic ophthalmic complication: Secondary | ICD-10-CM

## 2012-08-05 ENCOUNTER — Other Ambulatory Visit (HOSPITAL_COMMUNITY): Payer: Self-pay | Admitting: Internal Medicine

## 2012-08-05 DIAGNOSIS — Z1231 Encounter for screening mammogram for malignant neoplasm of breast: Secondary | ICD-10-CM

## 2012-09-07 ENCOUNTER — Ambulatory Visit (HOSPITAL_COMMUNITY)
Admission: RE | Admit: 2012-09-07 | Discharge: 2012-09-07 | Disposition: A | Discharge status: Home / Self Care | Payer: Medicare Other | Source: Ambulatory Visit | Attending: Internal Medicine | Admitting: Internal Medicine

## 2012-09-07 DIAGNOSIS — Z1231 Encounter for screening mammogram for malignant neoplasm of breast: Secondary | ICD-10-CM | POA: Insufficient documentation

## 2013-05-06 ENCOUNTER — Ambulatory Visit (INDEPENDENT_AMBULATORY_CARE_PROVIDER_SITE_OTHER): Payer: Medicare Other | Admitting: General Surgery

## 2013-05-27 ENCOUNTER — Other Ambulatory Visit: Payer: Self-pay | Admitting: Gastroenterology

## 2013-06-21 ENCOUNTER — Encounter (INDEPENDENT_AMBULATORY_CARE_PROVIDER_SITE_OTHER): Payer: Self-pay

## 2013-06-22 ENCOUNTER — Ambulatory Visit (INDEPENDENT_AMBULATORY_CARE_PROVIDER_SITE_OTHER): Payer: Medicare Other | Admitting: General Surgery

## 2013-07-07 ENCOUNTER — Ambulatory Visit (INDEPENDENT_AMBULATORY_CARE_PROVIDER_SITE_OTHER): Payer: Medicare Other | Admitting: Ophthalmology

## 2013-07-07 DIAGNOSIS — I10 Essential (primary) hypertension: Secondary | ICD-10-CM

## 2013-07-07 DIAGNOSIS — E1139 Type 2 diabetes mellitus with other diabetic ophthalmic complication: Secondary | ICD-10-CM

## 2013-07-07 DIAGNOSIS — H43819 Vitreous degeneration, unspecified eye: Secondary | ICD-10-CM

## 2013-07-07 DIAGNOSIS — E11319 Type 2 diabetes mellitus with unspecified diabetic retinopathy without macular edema: Secondary | ICD-10-CM

## 2013-07-07 DIAGNOSIS — H35039 Hypertensive retinopathy, unspecified eye: Secondary | ICD-10-CM

## 2013-07-07 DIAGNOSIS — H3530 Unspecified macular degeneration: Secondary | ICD-10-CM

## 2013-07-07 DIAGNOSIS — H2510 Age-related nuclear cataract, unspecified eye: Secondary | ICD-10-CM

## 2013-08-09 ENCOUNTER — Other Ambulatory Visit (HOSPITAL_COMMUNITY): Payer: Self-pay | Admitting: Internal Medicine

## 2013-08-09 DIAGNOSIS — Z1231 Encounter for screening mammogram for malignant neoplasm of breast: Secondary | ICD-10-CM

## 2013-08-12 ENCOUNTER — Encounter (INDEPENDENT_AMBULATORY_CARE_PROVIDER_SITE_OTHER): Payer: Medicare Other | Admitting: Ophthalmology

## 2013-08-12 DIAGNOSIS — H3530 Unspecified macular degeneration: Secondary | ICD-10-CM

## 2013-08-12 DIAGNOSIS — H35039 Hypertensive retinopathy, unspecified eye: Secondary | ICD-10-CM

## 2013-08-12 DIAGNOSIS — I10 Essential (primary) hypertension: Secondary | ICD-10-CM

## 2013-08-12 DIAGNOSIS — E11319 Type 2 diabetes mellitus with unspecified diabetic retinopathy without macular edema: Secondary | ICD-10-CM

## 2013-08-12 DIAGNOSIS — H43819 Vitreous degeneration, unspecified eye: Secondary | ICD-10-CM

## 2013-08-12 DIAGNOSIS — H2510 Age-related nuclear cataract, unspecified eye: Secondary | ICD-10-CM

## 2013-08-12 DIAGNOSIS — E1139 Type 2 diabetes mellitus with other diabetic ophthalmic complication: Secondary | ICD-10-CM

## 2013-09-13 ENCOUNTER — Ambulatory Visit (HOSPITAL_COMMUNITY)
Admission: RE | Admit: 2013-09-13 | Discharge: 2013-09-13 | Disposition: A | Discharge status: Home / Self Care | Payer: Medicare Other | Source: Ambulatory Visit | Attending: Internal Medicine | Admitting: Internal Medicine

## 2013-09-13 DIAGNOSIS — Z1231 Encounter for screening mammogram for malignant neoplasm of breast: Secondary | ICD-10-CM

## 2014-04-21 ENCOUNTER — Other Ambulatory Visit: Payer: Self-pay | Admitting: Internal Medicine

## 2014-04-21 DIAGNOSIS — R55 Syncope and collapse: Secondary | ICD-10-CM

## 2014-04-25 ENCOUNTER — Encounter: Payer: Self-pay | Admitting: Interventional Cardiology

## 2014-04-25 ENCOUNTER — Ambulatory Visit (INDEPENDENT_AMBULATORY_CARE_PROVIDER_SITE_OTHER): Payer: Medicare Other | Admitting: Interventional Cardiology

## 2014-04-25 VITALS — BP 130/81 | HR 85 | Ht 63.0 in | Wt 307.8 lb

## 2014-04-25 DIAGNOSIS — Z86711 Personal history of pulmonary embolism: Secondary | ICD-10-CM

## 2014-04-25 DIAGNOSIS — R55 Syncope and collapse: Secondary | ICD-10-CM

## 2014-04-25 DIAGNOSIS — R002 Palpitations: Secondary | ICD-10-CM

## 2014-04-25 DIAGNOSIS — E669 Obesity, unspecified: Secondary | ICD-10-CM

## 2014-04-25 NOTE — Progress Notes (Signed)
Patient ID: CARLON DAVIDSON, female   DOB: [birth year], 73 y.o.   MRN: [id number]     Patient ID: KAJA JACKOWSKI MRN: [id number] DOB/AGE: 07-Jun-1941 73 y.o.   Referring Physician Dr. Inda Merlin   Reason for Consultation : presyncope  HPI: 39 y/o who had a clean cath in 2010.  At th end of June, she had an epoisode when she was putting away groceries.  She was standing and the next thing she knew, she was on the floor.  She remembers going to the floor.    She did not hit her head.  She fell on her knee, and it was sore.  Occasional chest pain.  Not related to exertion.  It occurs behind her right breast and is worse with a deep breath.  She feels the room spinning sometimes.  She has had vertigo in the past.  She does not exercise much.  Most strenuous activity is taking out the trash.  She gardens a little as well.  She uses a walker due to weakness in the knee.  She has lost balance before.  Family history of CAD. Occasional fluttering in the chest.   Episodes can last a few minutes.  Occurs after activity and resolves within a few minutes of rest.   Current Outpatient Prescriptions  Medication Sig Dispense Refill  . allopurinol (ZYLOPRIM) 300 MG tablet Take 300 mg by mouth as directed.       Marland Kitchen aspirin 81 MG tablet Take 81 mg by mouth daily.      . calcium carbonate (OS-CAL) 600 MG TABS Take 600 mg by mouth daily.      . cetirizine (ZYRTEC) 10 MG tablet Take 10 mg by mouth as needed.      . Cholecalciferol (VITAMIN D) 2000 UNITS tablet Take 2,000 Units by mouth daily.      . furosemide (LASIX) 40 MG tablet Take 40 mg by mouth every other day.      Marland Kitchen HYDROcodone-acetaminophen (NORCO/VICODIN) 5-325 MG per tablet Take by mouth as directed.       Marland Kitchen losartan (COZAAR) 100 MG tablet Take 100 mg by mouth daily.      . LUTEIN PO Take by mouth as needed. Preser Vision - brand name      . Magnesium 250 MG TABS Take by mouth daily.      . metFORMIN (GLUCOPHAGE) 500 MG tablet Take 500 mg by mouth 2  (two) times daily with a meal.       . metoprolol tartrate (LOPRESSOR) 25 MG tablet Take 25 mg by mouth 2 (two) times daily.      . nitroGLYCERIN (NITROSTAT) 0.4 MG SL tablet Place 0.4 mg under the tongue every 5 (five) minutes as needed for chest pain.      . Omega-3 Fatty Acids (FISH OIL) 1000 MG CAPS Take by mouth 2 (two) times daily.      . potassium chloride SA (K-DUR,KLOR-CON) 20 MEQ tablet Take 20 mEq by mouth daily.      Marland Kitchen sulfaSALAzine (AZULFIDINE) 500 MG tablet Take 500 mg by mouth 4 (four) times daily.       No current facility-administered medications for this visit.   Past Medical History  Diagnosis Date  . Hyperlipidemia   . Hypertension   . Diabetes mellitus     boarderline  . Obesity   . Osteopenia   . Peripheral neuropathy   . Ulcerative colitis   . Pulmonary embolism     righ lower extremity  .  DVT (deep venous thrombosis) 01/2009  . Shortness of breath   . Gout   . History of colonic polyps 09/2010  . Lactose intolerance   . 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)     Family History  Problem Relation Age of Onset  . Colon cancer Mother   . Lung cancer Mother   . Heart disease Father     History   Social History  . Marital Status: Widowed    Spouse Name: N/A    Number of Children: N/A  . Years of Education: N/A   Occupational History  . Not on file.   Social History Main Topics  . Smoking status: Never Smoker   . Smokeless tobacco: Never Used  . Alcohol Use: No  . Drug Use: No  . Sexual Activity:    Other Topics Concern  . Not on file   Social History Narrative  . No narrative on file    Past Surgical History  Procedure Laterality Date  . Abdominal hysterectomy  1986  . Knee surgery  2005    left knee  . Colonoscopy      with polyp resection  . Knee surgery  2012    right knee  . Rotator cuff repair  2011    left   . Abdominal exploration surgery  1960      (Not in a hospital admission)  Review of systems complete and  found to be negative unless listed above .  No nausea, vomiting.  No fever chills, No focal weakness,  No palpitations.  Physical Exam: Filed Vitals:   04/25/14 1005  BP: 130/81  Pulse: 85    Weight: 307 lb 12.8 oz (139.617 kg)  Physical exam:  [place]/AT EOMI No JVD, No carotid bruit RRR S1S2  No wheezing Soft. NT, nondistended, obese No edema. No focal motor or sensory deficits Normal affect  Labs:   Lab Results  Component Value Date   WBC 9.3 01/17/2011   HGB 10.3* 01/17/2011   HCT 35.2* 01/17/2011   MCV 91.4 01/17/2011   PLT 143* 01/17/2011   No results found for this basename: NA, K, CL, CO2, BUN, CREATININE, CALCIUM, LABALBU, PROT, BILITOT, ALKPHOS, ALT, AST, GLUCOSE,  in the last 168 hours Lab Results  Component Value Date   CKTOTAL 57 02/07/2009   CKMB 1.6 02/07/2009   TROPONINI  Value: <0.01        NO INDICATION OF MYOCARDIAL INJURY. 02/07/2009    Lab Results  Component Value Date   CHOL  Value: 136        ATP III CLASSIFICATION:  <200     mg/dL   Desirable  200-239  mg/dL   Borderline High  >=240    mg/dL   High        02/07/2009   Lab Results  Component Value Date   HDL 45 02/07/2009   Lab Results  Component Value Date   LDLCALC  Value: 74        Total Cholesterol/HDL:CHD Risk Coronary Heart Disease Risk Table                     Men   Women  1/2 Average Risk   3.4   3.3  Average Risk       5.0   4.4  2 X Average Risk   9.6   7.1  3 X Average Risk  23.4   11.0        Use the calculated Patient  Ratio above and the CHD Risk Table to determine the patient's CHD Risk.        ATP III CLASSIFICATION (LDL):  <100     mg/dL   Optimal  100-129  mg/dL   Near or Above                    Optimal  130-159  mg/dL   Borderline  160-189  mg/dL   High  >190     mg/dL   Very High 02/07/2009   Lab Results  Component Value Date   TRIG 84 02/07/2009   Lab Results  Component Value Date   CHOLHDL 3.0 02/07/2009   No results found for this basename: LDLDIRECT       EKG:NSR, IRBBB, inferior  TWI  ASSESSMENT AND PLAN:   1) Presyncope: Cardiac cath in 2010 was negative for ischemia.  I asked her to stay well hydrated. We will check echocardiogram to evaluate for any structural heart disease.  She has had some sx of vertigo as well.  Unclear why she fell to the ground in her kitchen.  SHe is confident that she remembers everything and did not lose consciousness.  2) palpitations: She does feel some fluttering in her chest after exerting herself. It resolves quickly with rest. I think this may just be sinus tachycardia after activity due to deconditioning. She is not very active.  If her echocardiogram is unrevealing, we'll plan for 30 day event monitor.  3) Obesity: We spoke today about the benefits to her heart and knees if she lost weight.   Signed:  4) h/o PE: Took Coumadin for 1 year.   Mina Marble, MD, [facility] 04/25/2014, 10:44 AM

## 2014-04-25 NOTE — Patient Instructions (Signed)
Your physician has requested that you have an echocardiogram. Echocardiography is a painless test that uses sound waves to create images of your heart. It provides your doctor with information about the size and shape of your heart and how well your heart's chambers and valves are working. This procedure takes approximately one hour. There are no restrictions for this procedure.

## 2014-04-27 ENCOUNTER — Ambulatory Visit
Admission: RE | Admit: 2014-04-27 | Discharge: 2014-04-27 | Disposition: A | Discharge status: Home / Self Care | Payer: Medicare Other | Source: Ambulatory Visit | Attending: Internal Medicine | Admitting: Internal Medicine

## 2014-04-27 DIAGNOSIS — R55 Syncope and collapse: Secondary | ICD-10-CM

## 2014-05-05 ENCOUNTER — Ambulatory Visit (HOSPITAL_COMMUNITY): Payer: Medicare Other | Attending: Interventional Cardiology | Admitting: Radiology

## 2014-05-05 DIAGNOSIS — E785 Hyperlipidemia, unspecified: Secondary | ICD-10-CM | POA: Diagnosis not present

## 2014-05-05 DIAGNOSIS — E119 Type 2 diabetes mellitus without complications: Secondary | ICD-10-CM | POA: Insufficient documentation

## 2014-05-05 DIAGNOSIS — R55 Syncope and collapse: Secondary | ICD-10-CM | POA: Insufficient documentation

## 2014-05-05 DIAGNOSIS — I10 Essential (primary) hypertension: Secondary | ICD-10-CM | POA: Insufficient documentation

## 2014-05-05 DIAGNOSIS — Z86718 Personal history of other venous thrombosis and embolism: Secondary | ICD-10-CM | POA: Diagnosis not present

## 2014-05-05 DIAGNOSIS — Z86711 Personal history of pulmonary embolism: Secondary | ICD-10-CM | POA: Diagnosis not present

## 2014-05-05 NOTE — Progress Notes (Signed)
Echocardiogram performed.  

## 2014-05-06 ENCOUNTER — Telehealth: Payer: Self-pay | Admitting: Cardiology

## 2014-05-06 DIAGNOSIS — I7121 Aneurysm of the ascending aorta, without rupture: Secondary | ICD-10-CM

## 2014-05-06 DIAGNOSIS — I712 Thoracic aortic aneurysm, without rupture: Secondary | ICD-10-CM

## 2014-05-06 NOTE — Telephone Encounter (Signed)
Message copied by Alcario Drought on Fri May 06, 2014  1:24 PM ------      Message from: Jettie Booze      Created: Thu May 05, 2014  4:18 PM       [place] or [facility]. ------

## 2014-05-06 NOTE — Telephone Encounter (Signed)
CT Angio chest aorta order placed.

## 2014-05-10 ENCOUNTER — Ambulatory Visit (INDEPENDENT_AMBULATORY_CARE_PROVIDER_SITE_OTHER)
Admission: RE | Admit: 2014-05-10 | Discharge: 2014-05-10 | Disposition: A | Discharge status: Home / Self Care | Payer: Medicare Other | Source: Ambulatory Visit | Attending: Interventional Cardiology | Admitting: Interventional Cardiology

## 2014-05-10 DIAGNOSIS — I712 Thoracic aortic aneurysm, without rupture, unspecified: Secondary | ICD-10-CM

## 2014-05-10 DIAGNOSIS — I7121 Aneurysm of the ascending aorta, without rupture: Secondary | ICD-10-CM

## 2014-05-10 MED ORDER — IOHEXOL 350 MG/ML SOLN
100.0000 mL | Freq: Once | INTRAVENOUS | Status: AC | PRN
  Administered 2014-05-10: 100 mL via INTRAVENOUS

## 2014-05-11 ENCOUNTER — Telehealth: Payer: Self-pay | Admitting: Cardiology

## 2014-05-11 ENCOUNTER — Telehealth: Payer: Self-pay | Admitting: Interventional Cardiology

## 2014-05-11 DIAGNOSIS — I712 Thoracic aortic aneurysm, without rupture, unspecified: Secondary | ICD-10-CM

## 2014-05-11 NOTE — Telephone Encounter (Signed)
Referral placed. Pt notified.

## 2014-05-11 NOTE — Telephone Encounter (Signed)
Message copied by Alcario Drought on Wed May 11, 2014  4:54 PM ------      Message from: Jettie Booze      Created: Wed May 11, 2014  2:05 PM       Moderately sized thoracic aortic aneurysm. Please refer to [facility] for consult to get her in their system. ------

## 2014-05-11 NOTE — Telephone Encounter (Signed)
Returned pt's call.

## 2014-05-11 NOTE — Telephone Encounter (Signed)
New message     Want CT results

## 2014-05-18 ENCOUNTER — Encounter: Payer: Self-pay | Admitting: Surgery

## 2014-05-18 ENCOUNTER — Institutional Professional Consult (permissible substitution) (INDEPENDENT_AMBULATORY_CARE_PROVIDER_SITE_OTHER): Payer: Medicare Other | Admitting: Surgery

## 2014-05-18 VITALS — BP 138/87 | HR 69 | Ht 63.0 in | Wt 307.0 lb

## 2014-05-18 DIAGNOSIS — I7121 Aneurysm of the ascending aorta, without rupture: Secondary | ICD-10-CM

## 2014-05-18 DIAGNOSIS — I712 Thoracic aortic aneurysm, without rupture, unspecified: Secondary | ICD-10-CM

## 2014-05-18 NOTE — Progress Notes (Signed)
Cardiothoracic Surgery Consultation   PCP is Henrine Screws, MD Referring Provider is Jettie Booze, MD  Chief Complaint  Patient presents with  . 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 Irish Lack with symptoms of presyncope. She had a clean cath in 2010. She has had problems with balance and dizziness in the past. She reported some occasional episodes of chest discomfort and had an echo done which showed the ascending aorta to measure 4.9 cm. A prior echo in 01/2009 had shown an aortic root diameter of 3.7 cm. CTA of the chest then showed that the ascending aorta was 4.4 cm. 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  Past Medical History  Diagnosis Date  . Hyperlipidemia   . Hypertension   . Diabetes mellitus     boarderline  . Obesity   . Osteopenia   . Peripheral neuropathy   . Ulcerative colitis   . Pulmonary embolism     righ lower extremity  . DVT (deep venous thrombosis) 01/2009  . Shortness of breath   . Gout   . History of colonic polyps 09/2010  . Lactose intolerance   . 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 Abdominal hysterectomy  1986  . Knee surgery  2005    left knee  . Colonoscopy      with polyp resection  . Knee surgery  2012    right knee  . Rotator cuff repair  2011    left   . Abdominal exploration surgery  1960    Family History  Problem Relation Age of Onset  . Colon cancer Mother   . Lung cancer Mother   . Heart disease Father     Social History History  Substance Use Topics  . Smoking status: Never Smoker   . Smokeless tobacco: Never Used  . Alcohol  Use: No    Current Outpatient Prescriptions  Medication Sig Dispense Refill  . allopurinol (ZYLOPRIM) 300 MG tablet Take 300 mg by mouth as directed.       Marland Kitchen aspirin 81 MG tablet Take 81 mg by mouth daily.      . calcium carbonate (OS-CAL) 600 MG TABS Take 600 mg by mouth daily.      . Cholecalciferol (VITAMIN D) 2000 UNITS tablet Take 2,000 Units by mouth daily.      . furosemide (LASIX) 40 MG tablet Take 40 mg by mouth every other day.      . losartan (COZAAR) 100 MG tablet Take 100 mg by mouth daily.      . LUTEIN PO Take by mouth as needed. Preser Vision - brand name      . Magnesium 250 MG TABS Take by mouth daily.      . metFORMIN (GLUCOPHAGE) 500 MG tablet Take 500 mg by mouth 2 (two) times daily with a meal.       . metoprolol tartrate (LOPRESSOR) 25 MG tablet Take 25 mg by mouth 2 (two) times daily.      . Omega-3 Fatty Acids (FISH OIL) 1000 MG CAPS Take by mouth 2 (two) times daily.      Marland Kitchen  potassium chloride SA (K-DUR,KLOR-CON) 20 MEQ tablet Take 20 mEq by mouth daily.      Marland Kitchen sulfaSALAzine (AZULFIDINE) 500 MG tablet Take 500 mg by mouth 4 (four) times daily.      . cetirizine (ZYRTEC) 10 MG tablet Take 10 mg by mouth as needed.       No current facility-administered medications for this visit.    Allergies  Allergen Reactions  . Amlodipine Swelling    Of legs  . Tramadol Swelling    Of legs  . Diphtheria Toxoid   . Macrodantin [Nitrofurantoin Macrocrystal]   . Morphine And Related   . Tetanus Toxoids   . Diovan [Valsartan] Other (See Comments)    Cough    Review of Systems  Constitutional: Positive for activity change and fatigue.  HENT: Positive for hearing loss.   Eyes:       Floaters  Respiratory: Positive for cough.   Cardiovascular: Positive for chest pain, palpitations and leg swelling.  Gastrointestinal: Positive for abdominal pain and diarrhea.  Endocrine: Negative.   Genitourinary: Negative.   Musculoskeletal: Positive for arthralgias, gait problem,  joint swelling and myalgias.       Knees  Skin: Negative.   Allergic/Immunologic: Negative.   Neurological: Positive for dizziness.       Presyncope  Hematological: Negative.   Psychiatric/Behavioral: Negative.     BP 138/87  Pulse 69  Ht 5' 3"  (1.6 m)  Wt 307 lb (139.254 kg)  BMI 54.40 kg/m2 Physical Exam  Constitutional: She is oriented to person, place, and time.  Obese white female in no distress, difficult ambulation with a cane.  HENT:  Head: Normocephalic and atraumatic.  Mouth/Throat: Oropharynx is clear and moist.  Eyes: EOM are normal. Pupils are equal, round, and reactive to light.  Neck: Normal range of motion. Neck supple. No JVD present. No thyromegaly present.  Cardiovascular: Normal rate, regular rhythm, normal heart sounds and intact distal pulses.   No murmur heard. Pulmonary/Chest: Effort normal and breath sounds normal. No respiratory distress. She has no wheezes. She has no rales. She exhibits no tenderness.  Abdominal: Soft. Bowel sounds are normal. There is no tenderness.  Musculoskeletal: She exhibits edema.  Lymphadenopathy:    She has no cervical adenopathy.  Neurological: She is alert and oriented to person, place, and time. She has normal strength. No cranial nerve deficit or sensory deficit.  Skin: Skin is warm and dry.  Psychiatric: She has a normal mood and affect.     Diagnostic Tests:   [facility] [address], [place] [address] [phone]  ------------------------------------------------------------------- Echocardiography  Patient: Natasha Glass, Natasha Glass MR #: [id number] Study Date: 05/05/2014 Gender: F Age: 74 Height: 160 cm Weight: 139.3 kg BSA: 2.58 m^2 Pt. Status: Room:  SONOGRAPHER Victorio Palm, [place], MD ORDERING [place], [place], Outpatient  cc:  ------------------------------------------------------------------- LV EF: 55%  - 60%  ------------------------------------------------------------------- Indications: Syncope 780.2.  ------------------------------------------------------------------- History: PMH: Acquired from the patient and from the patient&'s chart. Syncope. Known deep vein thrombosis. Pulmonary embolic disease. Risk factors: Hypertension. Diabetes mellitus. Morbidly obese. Dyslipidemia.  ------------------------------------------------------------------- Study Conclusions  - Left ventricle: The cavity size was normal. Systolic function was normal. The estimated ejection fraction was in the range of 55% to 60%. Wall motion was normal; there were no regional wall motion abnormalities. There was an increased relative contribution of atrial contraction to ventricular filling. Doppler parameters are consistent with abnormal left ventricular relaxation (  grade 1 diastolic dysfunction). - Aorta: Ascending aortic diameter: 49 mm (S). - Ascending aorta: The ascending aorta was moderately dilated. - Mitral valve: Calcified annulus.  ------------------------------------------------------------------- Labs, prior tests, procedures, and surgery: Echocardiography (April 2010). EF was 60%.  Echocardiography. M-mode, complete 2D, spectral Doppler, and color Doppler. Birthdate: Patient birthdate: 07/26/1941. Age: Patient is 73 yr old. Sex: Gender: female. Height: Height: 160 cm. Height: 63 in. Weight: Weight: 139.3 kg. Weight: 306.4 lb. Body mass index: BMI: 54.4 kg/m^2. Body surface area: BSA: 2.58 m^2. Blood pressure: 130/75 Patient status: Outpatient. Study date: Study date: 05/05/2014. Study time: 10:06 AM. Location: [facility]  -------------------------------------------------------------------  ------------------------------------------------------------------- Left ventricle: The cavity size was normal. Systolic function was normal. The estimated ejection fraction was in the range of  55% to 60%. Wall motion was normal; there were no regional wall motion abnormalities. There was an increased relative contribution of atrial contraction to ventricular filling. Doppler parameters are consistent with abnormal left ventricular relaxation (grade 1 diastolic dysfunction).  ------------------------------------------------------------------- Aortic valve: Trileaflet; normal thickness leaflets. Mobility was not restricted. Doppler: Transvalvular velocity was within the normal range. There was no stenosis. There was no regurgitation.  ------------------------------------------------------------------- Aorta: Aortic root: The aortic root was normal in size. Ascending aorta: The ascending aorta was moderately dilated.  ------------------------------------------------------------------- Mitral valve: Calcified annulus. Mobility was not restricted. Doppler: Transvalvular velocity was within the normal range. There was no evidence for stenosis. There was no regurgitation. Peak gradient (D): 3 mm Hg.  ------------------------------------------------------------------- Left atrium: The atrium was normal in size.  ------------------------------------------------------------------- Right ventricle: The cavity size was normal. Wall thickness was normal. Systolic function was normal.  ------------------------------------------------------------------- Pulmonic valve: Structurally normal valve. Cusp separation was normal. Doppler: Transvalvular velocity was within the normal range. There was no evidence for stenosis. There was no regurgitation.  ------------------------------------------------------------------- Tricuspid valve: Structurally normal valve. Doppler: Transvalvular velocity was within the normal range. There was no regurgitation.  ------------------------------------------------------------------- Pulmonary artery: The main pulmonary artery was  normal-sized. Systolic pressure was within the normal range.  ------------------------------------------------------------------- Right atrium: The atrium was normal in size.  ------------------------------------------------------------------- Pericardium: There was no pericardial effusion.  ------------------------------------------------------------------- Systemic veins: Inferior vena cava: The vessel was normal in size.  ------------------------------------------------------------------- Prepared and Electronically Authenticated by  Fransico Him, MD 2015-07-23T11:14:19  ------------------------------------------------------------------- Measurements  Left ventricle Value 02/07/2009 Reference LV ID, ED, PLAX chordal (N) 44.6 mm 42 43 - 52 LV ID, ES, PLAX chordal (N) 28.7 mm 25 23 - 38 LV fx shortening, PLAX (N) 36 % 40 >=29 chordal LV PW thickness, ED 11 mm 13 --------- IVS/LV PW ratio, ED (N) 0.87 0.85 <=1.3 Stroke volume, 2D 65 ml ---------- --------- Stroke volume/bsa, 2D 25 ml/m^2 ---------- --------- LV e&', lateral 7.24 cm/s 7.41 --------- LV E/e&', lateral 12.75 12.52 --------- LV e&', medial 6.58 cm/s 7.9 --------- LV E/e&', medial 14.03 11.75 --------- LV e&', average 6.91 cm/s ---------- --------- LV E/e&', average 13.36 ---------- ---------  Ventricular septum Value 02/07/2009 Reference IVS thickness, ED 9.58 mm 11 ---------  LVOT Value 02/07/2009 Reference LVOT ID, S 18 mm ---------- --------- LVOT area 2.54 cm^2 ---------- --------- LVOT ID 18 mm ---------- --------- LVOT VTI, S 25.6 cm ---------- --------- Stroke volume (SV), LVOT 65.1 ml ---------- --------- DP Stroke index (SV/bsa), 25.2 ml/m^2 ---------- --------- LVOT DP  Aorta Value 02/07/2009 Reference Aortic root ID, ED 32 mm 37 --------- Ascending aorta ID, A-P, 49 mm ---------- --------- S  Left atrium Value 02/07/2009 Reference LA ID, A-P, ES 35 mm 39 ---------  LA ID/bsa, A-P (N)  1.36 cm/m^2 1.66 <=2.2  Mitral valve Value 02/07/2009 Reference Mitral E-wave peak 92.3 cm/s 92.8 --------- velocity Mitral A-wave peak 97.7 cm/s 119 --------- velocity Mitral deceleration time (H) 303 ms 236 150 - 230 Mitral peak gradient, D 3 mm Hg 3 --------- Mitral E/A ratio, peak 0.9 0.8 ---------  Right ventricle Value 02/07/2009 Reference RV s&', lateral, S 13.7 cm/s ---------- ---------  Legend: (L) and (H) mark values outside specified reference range.  (N) marks values inside specified reference range.     CLINICAL DATA: Ascending aortic aneurysm by echo  EXAM:  CT ANGIOGRAPHY CHEST WITH CONTRAST  TECHNIQUE:  Multidetector CT imaging of the chest was performed using the  standard protocol during bolus administration of intravenous  contrast. Multiplanar CT image reconstructions and MIPs were  obtained to evaluate the vascular anatomy.  CONTRAST: 126m OMNIPAQUE IOHEXOL 350 MG/ML SOLN  COMPARISON: None available  FINDINGS:  Ascending thoracic aorta demonstrates fusiform aneurysmal  dilatation, maximal AP diameter 5 cm and transverse diameter 4.4 cm,  image 30 series 80. Major branch vessels remain patent. No  dissection. No mediastinal hemorrhage or hematoma. Visualized  central pulmonary arteries appear patent without significant  pulmonary embolus. No adenopathy. Normal heart size. No pericardial  or pleural effusion. No significant hiatal hernia.  Lung windows demonstrate an incidental calcified granuloma in the  posterior left upper lobe, image 18 series 9. Minor dependent  bibasilar atelectasis. Lingula scarring noted. No acute airspace  process, collapse or consolidation. Negative for pneumonia or  interstitial disease. No pneumothorax.  Review of the MIP images confirms the above findings.  IMPRESSION:  5 cm ascending thoracic aortic aneurysm.  No other acute intra thoracic process  Calcified left upper lobe granuloma  Bibasilar atelectasis.   Electronically Signed  By: TDaryll BrodM.D.  On: 05/10/2014 14:10    Impression:  She has a 5 cm fusiform ascending aortic aneurysm that has changed minimally since 2010 when it was 4.4 cm by CTA. The usual surgical recommendation is to follow the aneurysm until it reaches 5.5 cm or grows 5 mm over a one year period. Given her multiple coexisting medical problems and poor mobility I would certainly be conservative. I discussed the importance of good blood pressure control, watching her diet and weight loss, diabetes control in minimizing her risk of further problems with this aneurysm and her general health and reducing her risk of surgery if eventually needed. She is on a beta blocker and ARB for BP control which is ideal.   Plan:  I will plan to see her back in one year with a CTA of the chest to follow up on this aneurysm.

## 2014-06-13 ENCOUNTER — Emergency Department (HOSPITAL_COMMUNITY)
Admission: EM | Admit: 2014-06-13 | Discharge: 2014-06-13 | Disposition: A | Discharge status: Home / Self Care | Payer: Medicare Other | Attending: Emergency Medicine | Admitting: Emergency Medicine

## 2014-06-13 ENCOUNTER — Encounter (HOSPITAL_COMMUNITY): Payer: Self-pay | Admitting: Emergency Medicine

## 2014-06-13 ENCOUNTER — Emergency Department (HOSPITAL_COMMUNITY): Payer: Medicare Other

## 2014-06-13 DIAGNOSIS — E119 Type 2 diabetes mellitus without complications: Secondary | ICD-10-CM | POA: Diagnosis not present

## 2014-06-13 DIAGNOSIS — Z86718 Personal history of other venous thrombosis and embolism: Secondary | ICD-10-CM | POA: Insufficient documentation

## 2014-06-13 DIAGNOSIS — Z7982 Long term (current) use of aspirin: Secondary | ICD-10-CM | POA: Diagnosis not present

## 2014-06-13 DIAGNOSIS — M109 Gout, unspecified: Secondary | ICD-10-CM | POA: Diagnosis not present

## 2014-06-13 DIAGNOSIS — R079 Chest pain, unspecified: Secondary | ICD-10-CM | POA: Insufficient documentation

## 2014-06-13 DIAGNOSIS — R0789 Other chest pain: Secondary | ICD-10-CM

## 2014-06-13 DIAGNOSIS — Z86711 Personal history of pulmonary embolism: Secondary | ICD-10-CM | POA: Insufficient documentation

## 2014-06-13 DIAGNOSIS — I10 Essential (primary) hypertension: Secondary | ICD-10-CM | POA: Diagnosis not present

## 2014-06-13 DIAGNOSIS — Z8669 Personal history of other diseases of the nervous system and sense organs: Secondary | ICD-10-CM | POA: Diagnosis not present

## 2014-06-13 DIAGNOSIS — M899 Disorder of bone, unspecified: Secondary | ICD-10-CM | POA: Insufficient documentation

## 2014-06-13 DIAGNOSIS — R011 Cardiac murmur, unspecified: Secondary | ICD-10-CM | POA: Insufficient documentation

## 2014-06-13 DIAGNOSIS — E669 Obesity, unspecified: Secondary | ICD-10-CM | POA: Insufficient documentation

## 2014-06-13 DIAGNOSIS — K5190 Ulcerative colitis, unspecified, without complications: Secondary | ICD-10-CM | POA: Insufficient documentation

## 2014-06-13 DIAGNOSIS — Z860100 Personal history of colon polyps, unspecified: Secondary | ICD-10-CM | POA: Insufficient documentation

## 2014-06-13 DIAGNOSIS — IMO0002 Reserved for concepts with insufficient information to code with codable children: Secondary | ICD-10-CM | POA: Insufficient documentation

## 2014-06-13 DIAGNOSIS — M949 Disorder of cartilage, unspecified: Secondary | ICD-10-CM

## 2014-06-13 DIAGNOSIS — Z79899 Other long term (current) drug therapy: Secondary | ICD-10-CM | POA: Diagnosis not present

## 2014-06-13 DIAGNOSIS — E785 Hyperlipidemia, unspecified: Secondary | ICD-10-CM | POA: Diagnosis not present

## 2014-06-13 LAB — BASIC METABOLIC PANEL
Anion gap: 14 (ref 5–15)
BUN: 14 mg/dL (ref 6–23)
CHLORIDE: 99 meq/L (ref 96–112)
CO2: 28 mEq/L (ref 19–32)
CREATININE: 0.54 mg/dL (ref 0.50–1.10)
Calcium: 10.4 mg/dL (ref 8.4–10.5)
Glucose, Bld: 126 mg/dL — ABNORMAL HIGH (ref 70–99)
Potassium: 3.9 mEq/L (ref 3.7–5.3)
SODIUM: 141 meq/L (ref 137–147)

## 2014-06-13 LAB — CBC
HCT: 41.6 % (ref 36.0–46.0)
Hemoglobin: 13.4 g/dL (ref 12.0–15.0)
MCH: 28.3 pg (ref 26.0–34.0)
MCHC: 32.2 g/dL (ref 30.0–36.0)
MCV: 87.9 fL (ref 78.0–100.0)
PLATELETS: 146 10*3/uL — AB (ref 150–400)
RBC: 4.73 MIL/uL (ref 3.87–5.11)
RDW: 16 % — ABNORMAL HIGH (ref 11.5–15.5)
WBC: 7 10*3/uL (ref 4.0–10.5)

## 2014-06-13 LAB — PRO B NATRIURETIC PEPTIDE: Pro B Natriuretic peptide (BNP): 175.1 pg/mL — ABNORMAL HIGH (ref 0–125)

## 2014-06-13 LAB — TROPONIN I: Troponin I: <0.3 ng/mL (ref <0.30)

## 2014-06-13 LAB — I-STAT TROPONIN, ED: TROPONIN I, POC: 0 ng/mL (ref 0.00–0.08)

## 2014-06-13 LAB — D-DIMER, QUANTITATIVE: D-Dimer, Quant: 0.43 ug/mL-FEU (ref 0.00–0.48)

## 2014-06-13 MED ORDER — OMEPRAZOLE 20 MG PO CPDR: 20.0000 mg | DELAYED_RELEASE_CAPSULE | Freq: Every day | ORAL | Status: DC

## 2014-06-13 MED ORDER — SUCRALFATE 1 G PO TABS: 1.0000 g | ORAL_TABLET | Freq: Three times a day (TID) | ORAL | Status: DC

## 2014-06-13 NOTE — Discharge Instructions (Signed)
As discussed, your evaluation today has been largely reassuring.  But, it is important that you monitor your condition carefully, and do not hesitate to return to the ED if you develop new, or concerning changes in your condition.  Otherwise, please follow-up with your physician for appropriate ongoing care.  Your pain is likely due to irritation of your stomach and/or esophagus.  Your will be started on a new medication for this.

## 2014-06-13 NOTE — ED Notes (Signed)
Follow up reviewed with pt. Pt verbalized understanding.

## 2014-06-13 NOTE — ED Notes (Addendum)
Pt reports having a fall in June, went to several doctors and cardiology, was diagnosed with ascending aortic aneurysm that has not been repaired. Pt had onset today of mid chest pains that radiates up into her chest. Having sob but states that is normal for her. Having nonproductive cough today. Reports that pain fluctuates and when severe will affect her eyes. ekg done at triage and no acute distress noted at this time.

## 2014-06-13 NOTE — ED Provider Notes (Signed)
CSN: [id number]     Arrival date & time 06/13/14  1815 History   First MD Initiated Contact with Patient 06/13/14 1846     Chief Complaint  Patient presents with  . Chest Pain     (Consider location/radiation/quality/duration/timing/severity/associated sxs/prior Treatment) HPI  Patient presents with chest pain.  She notes over the past 2 days she has been having increasingly frequent episodes of substernal chest discomfort, with radiation to the back. The pain is sharp, occurring at random, not clearly improved with anything, though not exertional. There is no associated dyspnea, there is fatigue with exertion. No new medication changes. Patient has a notable history of thoracic aortic aneurysm, with evaluation within the past month, and the aneurysm is thought to be approximately 5 cm in diameter currently, she is scheduled for followup in one year.   Past Medical History  Diagnosis Date  . Hyperlipidemia   . Hypertension   . Diabetes mellitus     boarderline  . Obesity   . Osteopenia   . Peripheral neuropathy   . Ulcerative colitis   . Pulmonary embolism     righ lower extremity  . DVT (deep venous thrombosis) 01/2009  . Shortness of breath   . Gout   . History of colonic polyps 09/2010  . Lactose intolerance   . 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 Abdominal hysterectomy  1986  . Knee surgery  2005    left knee  . Colonoscopy      with polyp resection  . Knee surgery  2012    right knee  . Rotator cuff repair  2011    left   . Abdominal exploration surgery  1960   Family History  Problem Relation Age of Onset  . Colon cancer Mother   . Lung cancer Mother   . Heart disease Father    History  Substance Use Topics  . Smoking status: Never Smoker   . Smokeless tobacco: Never Used  . Alcohol Use: No   OB History   Grav Para Term Preterm Abortions TAB SAB Ect Mult Living                  Review of Systems  Constitutional:       Per HPI, otherwise negative  HENT:       Per HPI, otherwise negative  Respiratory:       Per HPI, otherwise negative  Cardiovascular:       Per HPI, otherwise negative  Gastrointestinal: Negative for vomiting.  Endocrine:       Negative aside from HPI  Genitourinary:       Neg aside from HPI   Musculoskeletal:       Per HPI, otherwise negative  Skin: Negative.   Neurological: Negative for syncope.      Allergies  Morphine and related; Oysters; Amlodipine; Percocet; Tdap; Tetanus toxoids; Tramadol; Diovan; Diphtheria toxoid; and Macrodantin  Home Medications   Prior to Admission medications   Medication Sig Start Date End Date Taking? Authorizing Provider  furosemide (LASIX) 40 MG tablet Take 40 mg by mouth every other day.   Yes Historical Provider, MD  allopurinol (ZYLOPRIM) 300 MG tablet Take 300 mg by mouth as directed.  04/19/14   Historical Provider, MD  aspirin 81 MG tablet Take 81 mg by mouth daily.    Historical Provider, MD  calcium carbonate (OS-CAL) 600 MG TABS Take 600 mg by mouth daily.  Historical Provider, MD  cetirizine (ZYRTEC) 10 MG tablet Take 10 mg by mouth as needed.    Historical Provider, MD  Cholecalciferol (VITAMIN D) 2000 UNITS tablet Take 2,000 Units by mouth daily.    Historical Provider, MD  losartan (COZAAR) 100 MG tablet Take 100 mg by mouth daily.    Historical Provider, MD  LUTEIN PO Take by mouth as needed. Preser Vision - brand name    Historical Provider, MD  Magnesium 250 MG TABS Take by mouth daily.    Historical Provider, MD  metFORMIN (GLUCOPHAGE) 500 MG tablet Take 500 mg by mouth 2 (two) times daily with a meal.  04/19/14   Historical Provider, MD  metoprolol tartrate (LOPRESSOR) 25 MG tablet Take 25 mg by mouth 2 (two) times daily.    Historical Provider, MD  Omega-3 Fatty Acids (FISH OIL) 1000 MG CAPS Take by mouth 2 (two) times daily.    Historical Provider, MD  potassium chloride SA  (K-DUR,KLOR-CON) 20 MEQ tablet Take 20 mEq by mouth daily.    Historical Provider, MD  sulfaSALAzine (AZULFIDINE) 500 MG tablet Take 500 mg by mouth 4 (four) times daily.    Historical Provider, MD   BP 124/69  Pulse 80  Temp(Src) 98.4 F (36.9 C) (Oral)  Resp 20  Ht 5' 3"  (1.6 m)  Wt 307 lb (139.254 kg)  BMI 54.40 kg/m2  SpO2 100% Physical Exam  Nursing note and vitals reviewed. Constitutional: She is oriented to person, place, and time. She appears well-developed and well-nourished. No distress.  Obese elderly appearing female  HENT:  Head: Normocephalic and atraumatic.  Eyes: Conjunctivae and EOM are normal.  Cardiovascular: Normal rate and regular rhythm.   Murmur heard. Pulmonary/Chest: Effort normal and breath sounds normal. No stridor. No respiratory distress.  Abdominal: She exhibits no distension. There is no tenderness. There is no rebound.  Musculoskeletal: She exhibits no edema.  Neurological: She is alert and oriented to person, place, and time. No cranial nerve deficit.  Skin: Skin is warm and dry.  Psychiatric: She has a normal mood and affect.    ED Course  Procedures (including critical care time) Labs Review Labs Reviewed  BASIC METABOLIC PANEL - Abnormal; Notable for the following:    Glucose, Bld 126 (*)    All other components within normal limits  PRO B NATRIURETIC PEPTIDE - Abnormal; Notable for the following:    Pro B Natriuretic peptide (BNP) 175.1 (*)    All other components within normal limits  CBC  D-DIMER, QUANTITATIVE  I-STAT TROPOININ, ED    Imaging Review No results found.   EKG Interpretation   Date/Time:  Monday June 13 2014 18:22:57 EDT Ventricular Rate:  77 PR Interval:  218 QRS Duration: 94 QT Interval:  408 QTC Calculation: 461 R Axis:   5 Text Interpretation:  Sinus rhythm with 1st degree A-V block Low voltage  QRS Incomplete right bundle branch block Cannot rule out Anterior infarct  , age undetermined Abnormal ECG  Sinus rhythm with 1st degree A-V block  Artifact T wave abnormality Abnormal ekg Confirmed by Carmin Muskrat  MD  ([id number]) on 06/13/2014 6:47:50 PM     O2- 99%ra, nml   On repeat exam the patient is in no distress the MDM   Patient presents with episodic chest pain.  Patient's description of pain is burning, going from the epigastrium superiorly suggests gastroesophageal etiology. Patient is here negative troponins, and is currently taking her aspirin, as directed. Evaluation is reassuring in  terms of low suspicion for ongoing coronary ischemia. Patient does have a recently imaged aortic aneurysm.  Given the absence of a symmetric pulses, and the negative d-dimer, there is low suspicion for progression of his disease.  The risk of renal injury outweighs the benefit of additional CT scan tonight. Patient was discharged in stable condition to follow up with primary care and cardiology.   Carmin Muskrat, MD 06/13/14 2209

## 2014-07-08 ENCOUNTER — Ambulatory Visit (INDEPENDENT_AMBULATORY_CARE_PROVIDER_SITE_OTHER): Payer: Medicare Other | Admitting: Ophthalmology

## 2014-07-08 DIAGNOSIS — E1165 Type 2 diabetes mellitus with hyperglycemia: Secondary | ICD-10-CM

## 2014-07-08 DIAGNOSIS — H35039 Hypertensive retinopathy, unspecified eye: Secondary | ICD-10-CM

## 2014-07-08 DIAGNOSIS — I10 Essential (primary) hypertension: Secondary | ICD-10-CM

## 2014-07-08 DIAGNOSIS — H43819 Vitreous degeneration, unspecified eye: Secondary | ICD-10-CM

## 2014-07-08 DIAGNOSIS — H3530 Unspecified macular degeneration: Secondary | ICD-10-CM

## 2014-07-08 DIAGNOSIS — H2510 Age-related nuclear cataract, unspecified eye: Secondary | ICD-10-CM

## 2014-07-08 DIAGNOSIS — E1139 Type 2 diabetes mellitus with other diabetic ophthalmic complication: Secondary | ICD-10-CM

## 2014-07-08 DIAGNOSIS — E11319 Type 2 diabetes mellitus with unspecified diabetic retinopathy without macular edema: Secondary | ICD-10-CM

## 2014-08-17 ENCOUNTER — Other Ambulatory Visit: Payer: Self-pay | Admitting: Internal Medicine

## 2014-08-17 DIAGNOSIS — N6310 Unspecified lump in the right breast, unspecified quadrant: Secondary | ICD-10-CM

## 2014-09-14 ENCOUNTER — Ambulatory Visit
Admission: RE | Admit: 2014-09-14 | Discharge: 2014-09-14 | Disposition: A | Discharge status: Home / Self Care | Payer: Medicare Other | Source: Ambulatory Visit | Attending: Internal Medicine | Admitting: Internal Medicine

## 2014-09-14 DIAGNOSIS — N6310 Unspecified lump in the right breast, unspecified quadrant: Secondary | ICD-10-CM

## 2015-04-27 ENCOUNTER — Other Ambulatory Visit: Payer: Self-pay | Admitting: *Deleted

## 2015-04-27 DIAGNOSIS — I7121 Aneurysm of the ascending aorta, without rupture: Secondary | ICD-10-CM

## 2015-04-27 DIAGNOSIS — I712 Thoracic aortic aneurysm, without rupture: Secondary | ICD-10-CM

## 2015-06-06 LAB — CREATININE, SERUM: Creat: 0.53 mg/dL — ABNORMAL LOW (ref 0.60–0.93)

## 2015-06-06 LAB — BUN: BUN: 19 mg/dL (ref 7–25)

## 2015-06-07 ENCOUNTER — Encounter: Payer: Self-pay | Admitting: Surgery

## 2015-06-07 ENCOUNTER — Ambulatory Visit (INDEPENDENT_AMBULATORY_CARE_PROVIDER_SITE_OTHER): Payer: Medicare Other | Admitting: Surgery

## 2015-06-07 ENCOUNTER — Ambulatory Visit
Admission: RE | Admit: 2015-06-07 | Discharge: 2015-06-07 | Disposition: A | Discharge status: Home / Self Care | Payer: Medicare Other | Source: Ambulatory Visit | Attending: Surgery | Admitting: Surgery

## 2015-06-07 VITALS — BP 156/90 | HR 86 | Resp 20 | Ht 63.0 in | Wt 307.0 lb

## 2015-06-07 DIAGNOSIS — I7121 Aneurysm of the ascending aorta, without rupture: Secondary | ICD-10-CM

## 2015-06-07 DIAGNOSIS — I712 Thoracic aortic aneurysm, without rupture: Secondary | ICD-10-CM

## 2015-06-07 MED ORDER — IOPAMIDOL (ISOVUE-370) INJECTION 76%
100.0000 mL | Freq: Once | INTRAVENOUS | Status: AC | PRN
  Administered 2015-06-07: 100 mL via INTRAVENOUS

## 2015-06-07 NOTE — Progress Notes (Signed)
HPI:  She returns for follow up of her ascending aortic aneurysm. An echo in 01/2009 had shown an aortic root diameter of 3.7 cm. CTA of the chest then showed that the ascending aorta was 4.4 cm. CTA of the chest last year showed that it measured 5 cm but there was some artifact due to motion. She has been been stable of the past year. 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  Current Outpatient Prescriptions  Medication Sig Dispense Refill  . allopurinol (ZYLOPRIM) 300 MG tablet Take 300 mg by mouth daily.     Marland Kitchen aspirin 81 MG tablet Take 81 mg by mouth at bedtime.     . calcium carbonate (OS-CAL) 600 MG TABS Take 600 mg by mouth daily.    . cetaphil (CETAPHIL) cream Apply 1 application topically as needed (for dry skin (elbows and feet)).    Marland Kitchen Cholecalciferol (VITAMIN D) 2000 UNITS tablet Take 2,000 Units by mouth every morning.     . furosemide (LASIX) 40 MG tablet Take 40 mg by mouth daily.     Marland Kitchen ibuprofen (ADVIL,MOTRIN) 200 MG tablet Take 200 mg by mouth every 6 (six) hours as needed for moderate pain.    Marland Kitchen losartan (COZAAR) 100 MG tablet Take 100 mg by mouth daily.    . Magnesium 250 MG TABS Take 250 mg by mouth daily.     . metFORMIN (GLUCOPHAGE) 500 MG tablet Take 500 mg by mouth 2 (two) times daily with a meal.     . metoprolol tartrate (LOPRESSOR) 25 MG tablet Take 25 mg by mouth 2 (two) times daily.    . Multiple Vitamins-Minerals (PRESERVISION AREDS 2) CAPS Take 2 capsules by mouth daily.    . Omega-3 Fatty Acids (FISH OIL) 1000 MG CAPS Take 2,000 mg by mouth daily.     Marland Kitchen omeprazole (PRILOSEC) 20 MG capsule Take 1 capsule (20 mg total) by mouth daily. 20 capsule 0  . OVER THE COUNTER MEDICATION Take 1 application by mouth daily as needed (for ear).    . potassium chloride SA (K-DUR,KLOR-CON) 20 MEQ tablet Take 20 mEq by mouth daily.    . sucralfate (CARAFATE) 1 G tablet Take 1 tablet (1 g total) by mouth 4  (four) times daily -  with meals and at bedtime. 21 tablet 0  . sulfaSALAzine (AZULFIDINE) 500 MG tablet Take 500 mg by mouth 4 (four) times daily.    Marland Kitchen tetrahydrozoline 0.05 % ophthalmic solution Place 1 drop into both eyes daily as needed (for dry eyes).     No current facility-administered medications for this visit.     Physical Exam: BP 156/90 mmHg  Pulse 86  Resp 20  Ht 5' 3"  (1.6 m)  Wt 307 lb (139.254 kg)  BMI 54.40 kg/m2  SpO2 91% She is a morbidly obese woman in no distress Cardiac exam shows a regular rate and rhythm with normal heart sounds. There is no murmur. Lung exam is clear  Diagnostic Tests:  CLINICAL DATA: Ascending aortic aneurysm  EXAM: CT ANGIOGRAPHY CHEST WITH CONTRAST  TECHNIQUE: Multidetector CT imaging of the chest was performed using the standard protocol during bolus administration of intravenous contrast. Multiplanar CT image reconstructions and MIPs were obtained to evaluate the vascular anatomy.  CONTRAST: 100 cc Isovue 370  COMPARISON: 05/10/2014  FINDINGS: Maximal diameters of the ascending aorta at the sinus of Valsalva, sino-tubular junction, and ascending aorta are 3.8  cm, 3.2 cm, and 4.5 cm respectively. Previously, largest diameter was 5.0 cm. The prior measurement was probably performed in a diagonal fashion, artificially increasing the size. There was also a motion artifact on the prior study as the prior examination was not gated.  Innominate artery, right subclavian artery, right common carotid artery, right vertebral artery, left common carotid artery, left subclavian artery are patent. Left vertebral artery is poorly visualized but is grossly patent.  There is no evidence of aortic dissection.  There is no obvious evidence of acute pulmonary thromboembolism.  Minimal LAD territory coronary artery calcifications.  No abnormal mediastinal adenopathy.  No pneumothorax or pleural effusion.  Minimal  dependent atelectasis.  No acute bony deformity.  Hyperdense material in the dependent portion of the gallbladder represents sludge or possibly small gallstones.  Review of the MIP images confirms the above findings.  IMPRESSION: Maximal diameter of the ascending aorta is 4.5 cm. Allowing for differences in measuring technique, there has been no significant change.   Electronically Signed  By: Marybelle Killings M.D.  On: 06/07/2015 14:09  Impression:  She has a stable 4.5 cm ascending aortic aneurysm. The 5 cm measurement from last year was probably erroneous due to motion and a slightly tangential measurement.   Plan:  I will see her back in one year with a CTA of the chest to follow up on her aneurysm.   Gaye Pollack, MD [facility] [phone]

## 2015-07-20 ENCOUNTER — Ambulatory Visit (INDEPENDENT_AMBULATORY_CARE_PROVIDER_SITE_OTHER): Payer: Medicare Other | Admitting: Ophthalmology

## 2015-07-20 DIAGNOSIS — H353132 Nonexudative age-related macular degeneration, bilateral, intermediate dry stage: Secondary | ICD-10-CM | POA: Diagnosis not present

## 2015-07-20 DIAGNOSIS — I10 Essential (primary) hypertension: Secondary | ICD-10-CM

## 2015-07-20 DIAGNOSIS — H43813 Vitreous degeneration, bilateral: Secondary | ICD-10-CM

## 2015-07-20 DIAGNOSIS — H35033 Hypertensive retinopathy, bilateral: Secondary | ICD-10-CM | POA: Diagnosis not present

## 2016-02-23 ENCOUNTER — Other Ambulatory Visit: Payer: Self-pay | Admitting: Gastroenterology

## 2016-04-04 ENCOUNTER — Encounter (HOSPITAL_COMMUNITY): Payer: Self-pay | Admitting: *Deleted

## 2016-04-10 ENCOUNTER — Emergency Department (HOSPITAL_COMMUNITY): Payer: Medicare Other

## 2016-04-10 ENCOUNTER — Encounter (HOSPITAL_COMMUNITY): Payer: Self-pay | Admitting: Emergency Medicine

## 2016-04-10 ENCOUNTER — Observation Stay (HOSPITAL_COMMUNITY)
Admission: EM | Admit: 2016-04-10 | Discharge: 2016-04-12 | Disposition: A | Discharge status: Home / Self Care | Payer: Medicare Other | Attending: Internal Medicine | Admitting: Internal Medicine

## 2016-04-10 DIAGNOSIS — Z86711 Personal history of pulmonary embolism: Secondary | ICD-10-CM | POA: Diagnosis present

## 2016-04-10 DIAGNOSIS — E118 Type 2 diabetes mellitus with unspecified complications: Secondary | ICD-10-CM | POA: Diagnosis not present

## 2016-04-10 DIAGNOSIS — Z7982 Long term (current) use of aspirin: Secondary | ICD-10-CM | POA: Insufficient documentation

## 2016-04-10 DIAGNOSIS — R5383 Other fatigue: Secondary | ICD-10-CM | POA: Diagnosis not present

## 2016-04-10 DIAGNOSIS — R110 Nausea: Secondary | ICD-10-CM | POA: Diagnosis present

## 2016-04-10 DIAGNOSIS — Z7984 Long term (current) use of oral hypoglycemic drugs: Secondary | ICD-10-CM | POA: Insufficient documentation

## 2016-04-10 DIAGNOSIS — E785 Hyperlipidemia, unspecified: Secondary | ICD-10-CM | POA: Diagnosis not present

## 2016-04-10 DIAGNOSIS — J9601 Acute respiratory failure with hypoxia: Secondary | ICD-10-CM | POA: Diagnosis present

## 2016-04-10 DIAGNOSIS — I7121 Aneurysm of the ascending aorta, without rupture: Secondary | ICD-10-CM | POA: Diagnosis present

## 2016-04-10 DIAGNOSIS — R0781 Pleurodynia: Secondary | ICD-10-CM

## 2016-04-10 DIAGNOSIS — D696 Thrombocytopenia, unspecified: Secondary | ICD-10-CM | POA: Diagnosis not present

## 2016-04-10 DIAGNOSIS — I714 Abdominal aortic aneurysm, without rupture, unspecified: Secondary | ICD-10-CM

## 2016-04-10 DIAGNOSIS — R531 Weakness: Secondary | ICD-10-CM

## 2016-04-10 DIAGNOSIS — R1032 Left lower quadrant pain: Secondary | ICD-10-CM | POA: Diagnosis present

## 2016-04-10 DIAGNOSIS — I712 Thoracic aortic aneurysm, without rupture: Secondary | ICD-10-CM | POA: Diagnosis present

## 2016-04-10 DIAGNOSIS — I10 Essential (primary) hypertension: Secondary | ICD-10-CM | POA: Insufficient documentation

## 2016-04-10 DIAGNOSIS — R197 Diarrhea, unspecified: Secondary | ICD-10-CM

## 2016-04-10 DIAGNOSIS — E669 Obesity, unspecified: Secondary | ICD-10-CM | POA: Diagnosis present

## 2016-04-10 DIAGNOSIS — N390 Urinary tract infection, site not specified: Secondary | ICD-10-CM | POA: Diagnosis not present

## 2016-04-10 DIAGNOSIS — Z96653 Presence of artificial knee joint, bilateral: Secondary | ICD-10-CM | POA: Insufficient documentation

## 2016-04-10 DIAGNOSIS — E119 Type 2 diabetes mellitus without complications: Secondary | ICD-10-CM

## 2016-04-10 HISTORY — DX: Unspecified osteoarthritis, unspecified site: M19.90

## 2016-04-10 HISTORY — DX: Shortness of breath: R06.02

## 2016-04-10 HISTORY — DX: Type 2 diabetes mellitus without complications: E11.9

## 2016-04-10 HISTORY — DX: Personal history of other medical treatment: Z92.89

## 2016-04-10 HISTORY — DX: Pneumonia, unspecified organism: J18.9

## 2016-04-10 HISTORY — DX: Migraine, unspecified, not intractable, without status migrainosus: G43.909

## 2016-04-10 HISTORY — DX: Nonexudative age-related macular degeneration, unspecified eye, stage unspecified: H35.3190

## 2016-04-10 HISTORY — DX: Headache: R51

## 2016-04-10 HISTORY — DX: Headache, unspecified: R51.9

## 2016-04-10 LAB — URINALYSIS, ROUTINE W REFLEX MICROSCOPIC
Bilirubin Urine: NEGATIVE
Glucose, UA: NEGATIVE mg/dL
KETONES UR: 15 mg/dL — AB
NITRITE: POSITIVE — AB
PROTEIN: NEGATIVE mg/dL
Specific Gravity, Urine: 1.018 (ref 1.005–1.030)
pH: 6 (ref 5.0–8.0)

## 2016-04-10 LAB — COMPREHENSIVE METABOLIC PANEL
ALK PHOS: 64 U/L (ref 38–126)
ALT: 46 U/L (ref 14–54)
ANION GAP: 9 (ref 5–15)
AST: 40 U/L (ref 15–41)
Albumin: 3.4 g/dL — ABNORMAL LOW (ref 3.5–5.0)
BILIRUBIN TOTAL: 0.9 mg/dL (ref 0.3–1.2)
BUN: 13 mg/dL (ref 6–20)
CALCIUM: 9.1 mg/dL (ref 8.9–10.3)
CO2: 26 mmol/L (ref 22–32)
Chloride: 103 mmol/L (ref 101–111)
Creatinine, Ser: 0.62 mg/dL (ref 0.44–1.00)
GFR calc non Af Amer: >60 mL/min (ref >60)
Glucose, Bld: 156 mg/dL — ABNORMAL HIGH (ref 65–99)
Potassium: 3.8 mmol/L (ref 3.5–5.1)
Sodium: 138 mmol/L (ref 135–145)
TOTAL PROTEIN: 6.9 g/dL (ref 6.5–8.1)

## 2016-04-10 LAB — CBC WITH DIFFERENTIAL/PLATELET
Basophils Absolute: 0 10*3/uL (ref 0.0–0.1)
Basophils Relative: 0 %
Eosinophils Absolute: 0 10*3/uL (ref 0.0–0.7)
Eosinophils Relative: 0 %
HCT: 40.1 % (ref 36.0–46.0)
Hemoglobin: 12.6 g/dL (ref 12.0–15.0)
Lymphocytes Relative: 12 %
Lymphs Abs: 1.1 10*3/uL (ref 0.7–4.0)
MCH: 28.6 pg (ref 26.0–34.0)
MCHC: 31.4 g/dL (ref 30.0–36.0)
MCV: 90.9 fL (ref 78.0–100.0)
Monocytes Absolute: 0.5 10*3/uL (ref 0.1–1.0)
Monocytes Relative: 6 %
Neutro Abs: 7.1 10*3/uL (ref 1.7–7.7)
Neutrophils Relative %: 82 %
Platelets: 135 10*3/uL — ABNORMAL LOW (ref 150–400)
RBC: 4.41 MIL/uL (ref 3.87–5.11)
RDW: 16.1 % — ABNORMAL HIGH (ref 11.5–15.5)
WBC: 8.7 10*3/uL (ref 4.0–10.5)

## 2016-04-10 LAB — GLUCOSE, CAPILLARY: GLUCOSE-CAPILLARY: 117 mg/dL — AB (ref 65–99)

## 2016-04-10 LAB — LIPASE, BLOOD: Lipase: 18 U/L (ref 11–51)

## 2016-04-10 LAB — URINE MICROSCOPIC-ADD ON: RBC / HPF: NONE SEEN RBC/hpf (ref 0–5)

## 2016-04-10 LAB — I-STAT CG4 LACTIC ACID, ED
LACTIC ACID, VENOUS: 1.8 mmol/L (ref 0.5–1.9)
Lactic Acid, Venous: 1.61 mmol/L (ref 0.5–1.9)

## 2016-04-10 MED ORDER — ONDANSETRON HCL 4 MG/2ML IJ SOLN: 4.0000 mg | Freq: Four times a day (QID) | INTRAMUSCULAR | Status: DC | PRN

## 2016-04-10 MED ORDER — SULFASALAZINE 500 MG PO TABS
500.0000 mg | ORAL_TABLET | Freq: Four times a day (QID) | ORAL | Status: DC
  Administered 2016-04-10 – 2016-04-12 (×6): 500 mg via ORAL
  Filled 2016-04-10 (×8): qty 1

## 2016-04-10 MED ORDER — ENOXAPARIN SODIUM 40 MG/0.4ML ~~LOC~~ SOLN
40.0000 mg | SUBCUTANEOUS | Status: DC
  Administered 2016-04-10 – 2016-04-11 (×2): 40 mg via SUBCUTANEOUS
  Filled 2016-04-10 (×2): qty 0.4

## 2016-04-10 MED ORDER — ALLOPURINOL 300 MG PO TABS
300.0000 mg | ORAL_TABLET | Freq: Every day | ORAL | Status: DC
  Administered 2016-04-10 – 2016-04-12 (×3): 300 mg via ORAL
  Filled 2016-04-10: qty 3
  Filled 2016-04-10 (×2): qty 1

## 2016-04-10 MED ORDER — NAPHAZOLINE-GLYCERIN 0.012-0.2 % OP SOLN: 1.0000 [drp] | Freq: Four times a day (QID) | OPHTHALMIC | Status: DC | PRN

## 2016-04-10 MED ORDER — ONDANSETRON HCL 4 MG/2ML IJ SOLN
4.0000 mg | Freq: Three times a day (TID) | INTRAMUSCULAR | Status: DC | PRN
Start: 2016-04-10 — End: 2016-04-10

## 2016-04-10 MED ORDER — DEXTROSE 5 % IV SOLN
1.0000 g | INTRAVENOUS | Status: DC
  Administered 2016-04-10 – 2016-04-11 (×2): 1 g via INTRAVENOUS
  Filled 2016-04-10 (×3): qty 10

## 2016-04-10 MED ORDER — ONDANSETRON HCL 4 MG PO TABS: 4.0000 mg | ORAL_TABLET | Freq: Four times a day (QID) | ORAL | Status: DC | PRN

## 2016-04-10 MED ORDER — SODIUM CHLORIDE 0.9 % IV BOLUS (SEPSIS)
1000.0000 mL | Freq: Once | INTRAVENOUS | Status: AC
  Administered 2016-04-10: 1000 mL via INTRAVENOUS

## 2016-04-10 MED ORDER — METOPROLOL TARTRATE 25 MG PO TABS
25.0000 mg | ORAL_TABLET | Freq: Two times a day (BID) | ORAL | Status: DC
  Administered 2016-04-10 – 2016-04-12 (×5): 25 mg via ORAL
  Filled 2016-04-10: qty 1
  Filled 2016-04-10: qty 2
  Filled 2016-04-10: qty 1
  Filled 2016-04-10: qty 2
  Filled 2016-04-10: qty 1

## 2016-04-10 MED ORDER — INSULIN ASPART 100 UNIT/ML ~~LOC~~ SOLN: 0.0000 [IU] | Freq: Every day | SUBCUTANEOUS | Status: DC

## 2016-04-10 MED ORDER — INSULIN ASPART 100 UNIT/ML ~~LOC~~ SOLN
0.0000 [IU] | Freq: Three times a day (TID) | SUBCUTANEOUS | Status: DC
  Administered 2016-04-11 (×2): 2 [IU] via SUBCUTANEOUS
  Administered 2016-04-12: 5 [IU] via SUBCUTANEOUS

## 2016-04-10 MED ORDER — SODIUM CHLORIDE 0.9 % IV SOLN
INTRAVENOUS | Status: DC
  Administered 2016-04-10: 75 mL/h via INTRAVENOUS

## 2016-04-10 MED ORDER — ONDANSETRON HCL 4 MG/2ML IJ SOLN
4.0000 mg | Freq: Once | INTRAMUSCULAR | Status: AC
Start: 2016-04-10 — End: 2016-04-10
  Administered 2016-04-10: 4 mg via INTRAVENOUS
  Filled 2016-04-10: qty 2

## 2016-04-10 MED ORDER — LOSARTAN POTASSIUM 50 MG PO TABS
100.0000 mg | ORAL_TABLET | Freq: Every day | ORAL | Status: DC
  Administered 2016-04-11 – 2016-04-12 (×2): 100 mg via ORAL
  Filled 2016-04-10 (×2): qty 2

## 2016-04-10 MED ORDER — ASPIRIN EC 81 MG PO TBEC
81.0000 mg | DELAYED_RELEASE_TABLET | Freq: Every day | ORAL | Status: DC
  Administered 2016-04-10 – 2016-04-11 (×2): 81 mg via ORAL
  Filled 2016-04-10 (×2): qty 1

## 2016-04-10 MED ORDER — ACETAMINOPHEN 325 MG PO TABS
650.0000 mg | ORAL_TABLET | ORAL | Status: DC | PRN
  Administered 2016-04-10 – 2016-04-11 (×3): 650 mg via ORAL
  Filled 2016-04-10 (×2): qty 2

## 2016-04-10 NOTE — ED Provider Notes (Signed)
Patient presented to emergency room for evaluation of trouble with chills, abdominal pain and diarrhea. She went to her doctor's office today and was sent into the emergency room to have further evaluation. Patient denies any recent cough. No dysuria. She has felt short of breath but this is more of a chronic issue for her. She has had some abdominal cramping associated with her symptoms Physical Exam  BP 140/80 mmHg  Pulse 98  Temp(Src) 98.9 F (37.2 C) (Oral)  Resp 16  Ht 5' 6"  (1.676 m)  Wt 141.069 kg  BMI 50.22 kg/m2  SpO2 96%  Physical Exam  Constitutional: No distress.  Morbidly obese  HENT:  Head: Normocephalic and atraumatic.  Right Ear: External ear normal.  Left Ear: External ear normal.  Eyes: Conjunctivae are normal. Right eye exhibits no discharge. Left eye exhibits no discharge. No scleral icterus.  Neck: Neck supple. No tracheal deviation present.  Cardiovascular: Normal rate.   Pulmonary/Chest: Effort normal. No stridor. No respiratory distress.  Abdominal: Soft. There is tenderness. There is no rebound and no guarding.  Musculoskeletal: She exhibits no edema.  Neurological: She is alert. Cranial nerve deficit: no gross deficits.  Skin: Skin is warm and dry. No rash noted.  Venous stasis change in the lower extremities  Psychiatric: She has a normal mood and affect.  Nursing note and vitals reviewed.   ED Course  Procedures  MDM We'll plan on labs and x-rays including a chest x-ray. Will consider doing an abdominal pelvic CT if we don't identify any other source of possible infection      Dorie Rank, MD 04/10/16 1343

## 2016-04-10 NOTE — Progress Notes (Signed)
Patient admitted to room [id number] from ED with diagnosis of fever, nausea, and UTI.  Patient arrived on stretcher and was assisted to BR with 2 assists and cane.  She was quite SOB and wheezing throughout the process.  She stated this was outside her norm at home where she is able to be independent aside from the use of a cane for ambulation.  Son is present and states that as of late he had been noticing that his mother has required more assist, which she has been resistive to.  He verbalizes concern re:  her ability to resume normal ADL activities.  Patient required 2 assists and eventual use of a walker when returning to bed.  Her oxygen sat was 85% on R/A and she continued to be symptomatic.  It was necessary to stop several times on way to the bed.  Oxygen was required at 2 liters until sat increased to 94%.  She required total assist for perineal care, which she could not perform independently.  Urine was very foul smelling.  No skin breakdown was noted.

## 2016-04-10 NOTE — H&P (Signed)
History and Physical    TALAYLA DOYEL TAV:[id number] DOB: [birth year] DOA: 04/10/2016  PCP: Henrine Screws, MD Patient coming from: PCP / Home  Chief Complaint: chills/subjective fever/abdominal pain/nausea  HPI: Natasha Glass is a very pleasant 75 y.o. female with medical history significant for obesity, diabetes, hypertension, aortic aneurysm, ulcerative colitis presents to the emergency department with chief complaint of persistent chills, subjective fever, nausea without vomiting. Initial evaluation in the emergency department reveals a urinary tract infection.  Information is obtained from the patient and her son who is at the bedside. Patient reports 2 days ago she developed chills. Shortly thereafter she became nauseated and had 2 episodes of diarrhea. She denies melena or bright red blood per rectum. Associated symptoms include intermittent left lower quadrant pain. She describes the pain as cramp-like worse with bowel movements. She denies any emesis. She does report a decreased oral intake for the last 2 days. She denies chest pain palpitation headache dizziness syncope or near-syncope. She denies dysuria hematuria frequency or urgency. She denies any worsening lower extremity edema or worsening shortness of breath. She has taken ibuprofen for her fever. Today she was no better she felt weak so she went to her primary care provider. [facility] provider recommended she come to the emergency department for further evaluation    ED Course: She received IV fluids and Rocephin as well as Zofran  Review of Systems: As per HPI otherwise 10 point review of systems negative.   Ambulatory Status: She ambulates with a cane and is not so steady on her feet. She denies any recent falls  Past Medical History  Diagnosis Date  . Hyperlipidemia   . Hypertension   . Diabetes mellitus     boarderline  . Obesity   . Osteopenia   . Peripheral neuropathy ([place])   . Ulcerative colitis ([place])     . Pulmonary embolism ([place])     righ lower extremity  . DVT (deep venous thrombosis) ([place]) 01/2009  . Gout   . History of colonic polyps 09/2010  . Lactose intolerance   . Universal ulcerative (chronic) colitis   . Shortness of breath     with exertion  . Urinary incontinence     wears peripad  . Transfusion history     greater than 20 yrs ago(colitis).  . GERD (gastroesophageal reflux disease)     occ.  . Macular degeneration     bilateral    Past Surgical History  Procedure Laterality Date  . Knee surgery  2005    left knee  . Colonoscopy      with polyp resection  . Knee surgery  2012    right knee  . Rotator cuff repair  2011    left   . Abdominal exploration surgery  1960  . Joint replacement Bilateral     BTKA  . Abdominal hysterectomy  1986    Social History   Social History  . Marital Status: Widowed    Spouse Name: N/A  . Number of Children: N/A  . Years of Education: N/A   Occupational History  . Not on file.   Social History Main Topics  . Smoking status: Never Smoker   . Smokeless tobacco: Never Used  . Alcohol Use: No  . Drug Use: No  . Sexual Activity: Not on file   Other Topics Concern  . Not on file   Social History Narrative    Allergies  Allergen Reactions  . Morphine And Related Other (  See Comments)    Unresponsive-Very bad reaction.  Can take hydrocodone.   [name] Allergy] Shortness Of Breath, Itching, Swelling and Rash  . Amlodipine Swelling    Of legs  . Percocet [Oxycodone-Acetaminophen] Other (See Comments)    hallucinations  . Tdap [Diphth-Acell Pertussis-Tetanus] Itching, Swelling and Rash  . Tetanus Toxoids Itching, Swelling and Rash  . Tramadol Swelling    Of legs  . Diovan [Valsartan] Other (See Comments)    Cough  . Diphtheria Toxoid Itching, Swelling and Rash  . Macrodantin [Nitrofurantoin Macrocrystal] Other (See Comments)    unspecified    Family History  Problem Relation Age of Onset  .  Colon cancer Mother   . Lung cancer Mother   . Heart disease Father     Prior to Admission medications   Medication Sig Start Date End Date Taking? Authorizing Provider  allopurinol (ZYLOPRIM) 300 MG tablet Take 300 mg by mouth daily.  04/19/14  Yes Historical Provider, MD  aspirin 81 MG tablet Take 81 mg by mouth at bedtime.    Yes Historical Provider, MD  Cholecalciferol (VITAMIN D) 2000 UNITS tablet Take 2,000 Units by mouth every morning.    Yes Historical Provider, MD  furosemide (LASIX) 40 MG tablet Take 40 mg by mouth daily as needed for fluid.    Yes Historical Provider, MD  ibuprofen (ADVIL,MOTRIN) 200 MG tablet Take 200 mg by mouth every 6 (six) hours as needed for moderate pain (sleep).    Yes Historical Provider, MD  losartan (COZAAR) 100 MG tablet Take 100 mg by mouth daily.   Yes Historical Provider, MD  Magnesium 250 MG TABS Take 250 mg by mouth daily.    Yes Historical Provider, MD  metFORMIN (GLUCOPHAGE) 500 MG tablet Take 500 mg by mouth 2 (two) times daily with a meal.  04/19/14  Yes Historical Provider, MD  metoprolol tartrate (LOPRESSOR) 25 MG tablet Take 25 mg by mouth 2 (two) times daily.   Yes Historical Provider, MD  Multiple Vitamins-Minerals (PRESERVISION AREDS 2) CAPS Take 2 capsules by mouth daily.   Yes Historical Provider, MD  Omega-3 Fatty Acids (FISH OIL) 1000 MG CAPS Take 2,000 mg by mouth daily.    Yes Historical Provider, MD  omeprazole (PRILOSEC) 20 MG capsule Take 1 capsule (20 mg total) by mouth daily. Patient taking differently: Take 20 mg by mouth daily as needed.  06/13/14  Yes Carmin Muskrat, MD  sulfaSALAzine (AZULFIDINE) 500 MG tablet Take 500 mg by mouth 4 (four) times daily.   Yes Historical Provider, MD  tetrahydrozoline 0.05 % ophthalmic solution Place 1 drop into both eyes daily as needed (for dry eyes).   Yes Historical Provider, MD    Physical Exam: Filed Vitals:   04/10/16 1245 04/10/16 1400 04/10/16 1500 04/10/16 1545  BP: 114/71 122/58  115/84 144/85  Pulse: 93 85 90 91  Temp:      TempSrc:      Resp:    16  Height:      Weight:      SpO2: 98% 99% 100% 100%     General:  Appears calm and comfortable, obese Eyes:  PERRL, EOMI, normal lids, iris ENT:  grossly normal hearing, lips & tongue, mucous membranes of her mouth are pink but dry Neck:  no LAD, masses or thyromegaly Cardiovascular:  RRR, no m/r/g. Trace LE edema bilaterally with bilateral chronic venous stasis changes. Respiratory:  CTA bilaterally but somewhat distant, no w/r/r. Normal respiratory effort. Abdomen:  Obese  nondistended soft positive bowel sounds throughout mild diffuse tenderness to palpation no guarding or rebounding Skin:  no rash or induration seen on limited exam Musculoskeletal:  Poor tone BUE/BLE, good ROM, no bony abnormality Psychiatric:  grossly normal mood and affect, speech fluent and appropriate, AOx3 Neurologic:  CN 2-12 grossly intact, moves all extremities in coordinated fashion, sensation intact  Labs on Admission: I have personally reviewed following labs and imaging studies  CBC:  Recent Labs Lab 04/10/16 1210  WBC 8.7  NEUTROABS 7.1  HGB 12.6  HCT 40.1  MCV 90.9  PLT 256*   Basic Metabolic Panel:  Recent Labs Lab 04/10/16 1210  NA 138  K 3.8  CL 103  CO2 26  GLUCOSE 156*  BUN 13  CREATININE 0.62  CALCIUM 9.1   GFR: Estimated Creatinine Clearance: 89.6 mL/min (by C-G formula based on Cr of 0.62). Liver Function Tests:  Recent Labs Lab 04/10/16 1210  AST 40  ALT 46  ALKPHOS 64  BILITOT 0.9  PROT 6.9  ALBUMIN 3.4*    Recent Labs Lab 04/10/16 1210  LIPASE 18   No results for input(s): AMMONIA in the last 168 hours. Coagulation Profile: No results for input(s): INR, PROTIME in the last 168 hours. Cardiac Enzymes: No results for input(s): CKTOTAL, CKMB, CKMBINDEX, TROPONINI in the last 168 hours. BNP (last 3 results) No results for input(s): PROBNP in the last 8760 hours. HbA1C: No  results for input(s): HGBA1C in the last 72 hours. CBG: No results for input(s): GLUCAP in the last 168 hours. Lipid Profile: No results for input(s): CHOL, HDL, LDLCALC, TRIG, CHOLHDL, LDLDIRECT in the last 72 hours. Thyroid Function Tests: No results for input(s): TSH, T4TOTAL, FREET4, T3FREE, THYROIDAB in the last 72 hours. Anemia Panel: No results for input(s): VITAMINB12, FOLATE, FERRITIN, TIBC, IRON, RETICCTPCT in the last 72 hours. Urine analysis:    Component Value Date/Time   COLORURINE YELLOW 04/10/2016 1408   APPEARANCEUR TURBID* 04/10/2016 1408   LABSPEC 1.018 04/10/2016 1408   PHURINE 6.0 04/10/2016 1408   GLUCOSEU NEGATIVE 04/10/2016 1408   HGBUR TRACE* 04/10/2016 1408   BILIRUBINUR NEGATIVE 04/10/2016 1408   KETONESUR 15* 04/10/2016 1408   PROTEINUR NEGATIVE 04/10/2016 1408   UROBILINOGEN 0.2 01/04/2011 0915   NITRITE POSITIVE* 04/10/2016 1408   LEUKOCYTESUR LARGE* 04/10/2016 1408    Creatinine Clearance: Estimated Creatinine Clearance: 89.6 mL/min (by C-G formula based on Cr of 0.62).  Sepsis Labs: @LABRCNTIP (procalcitonin:4,lacticidven:4) )No results found for this or any previous visit (from the past 240 hour(s)).   Radiological Exams on Admission: Dg Chest 2 View  04/10/2016  CLINICAL DATA:  Shortness of breath for 2 days.  Fever. EXAM: CHEST  2 VIEW COMPARISON:  06/13/2014 FINDINGS: Heart is borderline in size. No confluent airspace opacities or effusions. No edema. No acute bony abnormality. IMPRESSION: Borderline cardiomegaly.  No active disease. Electronically Signed   By: Rolm Baptise M.D.   On: 04/10/2016 13:30    EKG: none  Assessment/Plan Principal Problem:   UTI (lower urinary tract infection) Active Problems:   Obesity   Hypertension   History of pulmonary embolism   Ascending aortic aneurysm (HCC)   Generalized weakness   Nausea   Diabetes ([place])   #1. Urinary tract infection. Urinalysis with many bacteria and too numerous to count  WBCs, large leukocytes positive nitrites. Culture pending. She is afebrile hemodynamically stable and nontoxic appearing. -Admit to medical floor -Gentle IV fluids -Monitor urine output -Continue Rocephin initiated in the emergency department -Follow  urine culture  #2. Nausea. No emesis. Likely related to #1. See #1. chest x-ray with no active disease, no metabolic derangement, lactic acid within limits of normal, lipase within limits of normal. -Zofran as needed -Diet as tolerated -Monitor  #3. Generalized weakness/deconditioning. Likely related to above in the setting of morbid obesity -Physical therapy consult -May benefit from home health PT  #4. Hypertension. Fair control in the emergency department. Home medications include Lasix, losartan, metoprolol.  -We will hold Lasix for now -Continue metoprolol and losartan -Monitor  #5. Diabetes. On oral agents at home. -Hold metformin for now -Obtain hemoglobin A1c -Use sliding scale insulin for optimal control  #6. Morbid obesity. BMI 50.3 -Nutritional consult  #7. History of aortic aneurysm. stable Chart review indicates last visit with cardiovascular thoracic surgery was August 2016. Office noted that time states stable 4.5 cm ascending aortic aneurysm. Plan for yearly visits -Remind patient of 1 year mark August for follow-up    DVT prophylaxis: lovenox Code Status: full  Family Communication: son at bedside  Disposition Plan: home when ready hopefully tomorrow  Consults called: none  Admission status: obs    Radene Gunning MD [facility]  If 7PM-7AM, please contact night-coverage [email or web address] Password [facility]  04/10/2016, 4:08 PM

## 2016-04-10 NOTE — ED Notes (Signed)
Attempted report 

## 2016-04-10 NOTE — ED Provider Notes (Signed)
CSN: [id number]     Arrival date & time 04/10/16  1145 History   First MD Initiated Contact with Patient 04/10/16 1235     Chief Complaint  Patient presents with  . Chills  . Abdominal Pain     (Consider location/radiation/quality/duration/timing/severity/associated sxs/prior Treatment) HPI Comments: Patient is a 75 year old female with streaky of ulcerative colitis, hypertension, diabetes, history of DVT and PE who presents with fever, chills, abdominal pain, diarrhea. Patient began yesterday with 2 episodes of diarrhea and left lower quadrant abdominal cramping that is intermittent. Patient had a fever of 102 during the night last night. Patient has had some nausea, but no vomiting. Patient has had associated fatigue and chills. These seem to be the patient's most troubling symptoms, as she is having difficulty walking due to generalized weakness and fatigue. On review of systems, patient also reports left-sided chest pain intermittently once a day for the past week. Patient describes her pain as stabbing. Patient has baseline shortness of breath which she thinks is due to her weight, however her shortness of breath has been worse since onset of her illness. Patient has taken ibuprofen for her fever, but no other new medicines. Patient was sent by her primary care provider for further evaluation and admission.  Patient is a 75 y.o. female presenting with abdominal pain. The history is provided by the patient.  Abdominal Pain Associated symptoms: diarrhea and nausea   Associated symptoms: no chest pain, no chills, no dysuria, no fever, no shortness of breath, no sore throat and no vomiting     Past Medical History  Diagnosis Date  . Hyperlipidemia   . Hypertension   . Diabetes mellitus     boarderline  . Obesity   . Osteopenia   . Peripheral neuropathy ([place])   . Ulcerative colitis ([place])   . Pulmonary embolism ([place])     righ lower extremity  . DVT (deep venous thrombosis) ([place]) 01/2009   . Gout   . History of colonic polyps 09/2010  . Lactose intolerance   . Universal ulcerative (chronic) colitis   . Shortness of breath     with exertion  . Urinary incontinence     wears peripad  . Transfusion history     greater than 20 yrs ago(colitis).  . GERD (gastroesophageal reflux disease)     occ.  . Macular degeneration     bilateral   Past Surgical History  Procedure Laterality Date  . Knee surgery  2005    left knee  . Colonoscopy      with polyp resection  . Knee surgery  2012    right knee  . Rotator cuff repair  2011    left   . Abdominal exploration surgery  1960  . Joint replacement Bilateral     BTKA  . Abdominal hysterectomy  1986   Family History  Problem Relation Age of Onset  . Colon cancer Mother   . Lung cancer Mother   . Heart disease Father    Social History  Substance Use Topics  . Smoking status: Never Smoker   . Smokeless tobacco: Never Used  . Alcohol Use: No   OB History    No data available     Review of Systems  Constitutional: Negative for fever and chills.  HENT: Negative for facial swelling and sore throat.   Respiratory: Negative for shortness of breath.   Cardiovascular: Negative for chest pain.  Gastrointestinal: Positive for nausea, abdominal pain and diarrhea. Negative for  vomiting and blood in stool.  Genitourinary: Negative for dysuria.  Musculoskeletal: Negative for back pain.  Skin: Negative for rash and wound.  Neurological: Negative for headaches.  Psychiatric/Behavioral: The patient is not nervous/anxious.       Allergies  Morphine and related; Oysters; Amlodipine; Percocet; Tdap; Tetanus toxoids; Tramadol; Diovan; Diphtheria toxoid; and Macrodantin  Home Medications   Prior to Admission medications   Medication Sig Start Date End Date Taking? Authorizing Provider  allopurinol (ZYLOPRIM) 300 MG tablet Take 300 mg by mouth daily.  04/19/14  Yes Historical Provider, MD  aspirin 81 MG tablet Take 81 mg by  mouth at bedtime.    Yes Historical Provider, MD  Cholecalciferol (VITAMIN D) 2000 UNITS tablet Take 2,000 Units by mouth every morning.    Yes Historical Provider, MD  furosemide (LASIX) 40 MG tablet Take 40 mg by mouth daily as needed for fluid.    Yes Historical Provider, MD  ibuprofen (ADVIL,MOTRIN) 200 MG tablet Take 200 mg by mouth every 6 (six) hours as needed for moderate pain (sleep).    Yes Historical Provider, MD  losartan (COZAAR) 100 MG tablet Take 100 mg by mouth daily.   Yes Historical Provider, MD  Magnesium 250 MG TABS Take 250 mg by mouth daily.    Yes Historical Provider, MD  metFORMIN (GLUCOPHAGE) 500 MG tablet Take 500 mg by mouth 2 (two) times daily with a meal.  04/19/14  Yes Historical Provider, MD  metoprolol tartrate (LOPRESSOR) 25 MG tablet Take 25 mg by mouth 2 (two) times daily.   Yes Historical Provider, MD  Multiple Vitamins-Minerals (PRESERVISION AREDS 2) CAPS Take 2 capsules by mouth daily.   Yes Historical Provider, MD  Omega-3 Fatty Acids (FISH OIL) 1000 MG CAPS Take 2,000 mg by mouth daily.    Yes Historical Provider, MD  omeprazole (PRILOSEC) 20 MG capsule Take 1 capsule (20 mg total) by mouth daily. Patient taking differently: Take 20 mg by mouth daily as needed.  06/13/14  Yes Carmin Muskrat, MD  sulfaSALAzine (AZULFIDINE) 500 MG tablet Take 500 mg by mouth 4 (four) times daily.   Yes Historical Provider, MD  tetrahydrozoline 0.05 % ophthalmic solution Place 1 drop into both eyes daily as needed (for dry eyes).   Yes Historical Provider, MD   BP 114/71 mmHg  Pulse 93  Temp(Src) 98.9 F (37.2 C) (Oral)  Resp 16  Ht 5' 6"  (1.676 m)  Wt 141.069 kg  BMI 50.22 kg/m2  SpO2 98% Physical Exam  Constitutional: She appears well-developed and well-nourished. No distress.  HENT:  Head: Normocephalic and atraumatic.  Mouth/Throat: Mucous membranes are dry. No oropharyngeal exudate.  Eyes: Conjunctivae are normal. Pupils are equal, round, and reactive to light.  Right eye exhibits no discharge. Left eye exhibits no discharge. No scleral icterus.  Neck: Normal range of motion. Neck supple. No thyromegaly present.  Cardiovascular: Normal rate, regular rhythm, normal heart sounds and intact distal pulses.  Exam reveals no gallop and no friction rub.   No murmur heard. Pulmonary/Chest: Effort normal and breath sounds normal. No stridor. No respiratory distress. She has no wheezes. She has no rales.  Abdominal: Soft. Bowel sounds are normal. She exhibits no distension. There is tenderness in the left lower quadrant. There is no rebound and no guarding.  Mild "tinge" on palpation to left lower quadrant  Musculoskeletal: She exhibits no edema.  Lymphadenopathy:    She has no cervical adenopathy.  Neurological: She is alert. Coordination normal.  Skin: Skin is  warm and dry. No rash noted. She is not diaphoretic. No pallor.  Psychiatric: She has a normal mood and affect.  Nursing note and vitals reviewed.   ED Course  Procedures (including critical care time) Labs Review Labs Reviewed  COMPREHENSIVE METABOLIC PANEL - Abnormal; Notable for the following:    Glucose, Bld 156 (*)    Albumin 3.4 (*)    All other components within normal limits  URINALYSIS, ROUTINE W REFLEX MICROSCOPIC (NOT AT [facility]) - Abnormal; Notable for the following:    APPearance TURBID (*)    Hgb urine dipstick TRACE (*)    Ketones, ur 15 (*)    Nitrite POSITIVE (*)    Leukocytes, UA LARGE (*)    All other components within normal limits  CBC WITH DIFFERENTIAL/PLATELET - Abnormal; Notable for the following:    RDW 16.1 (*)    Platelets 135 (*)    All other components within normal limits  URINE MICROSCOPIC-ADD ON - Abnormal; Notable for the following:    Squamous Epithelial / LPF 0-5 (*)    Bacteria, UA MANY (*)    All other components within normal limits  URINE CULTURE  URINE CULTURE  LIPASE, BLOOD  HEMOGLOBIN A1C  I-STAT CG4 LACTIC ACID, ED  I-STAT CG4 LACTIC ACID, ED     Imaging Review Dg Chest 2 View  04/10/2016  CLINICAL DATA:  Shortness of breath for 2 days.  Fever. EXAM: CHEST  2 VIEW COMPARISON:  06/13/2014 FINDINGS: Heart is borderline in size. No confluent airspace opacities or effusions. No edema. No acute bony abnormality. IMPRESSION: Borderline cardiomegaly.  No active disease. Electronically Signed   By: Rolm Baptise M.D.   On: 04/10/2016 13:30   I have personally reviewed and evaluated these images and lab results as part of my medical decision-making.   EKG Interpretation None      MDM   CBC unremarkable, except chronic thrombocytopenia. CMP shows glucose 156, albumin 3.4. Lipase 18. Lactate 1.8. UA shows positive nitrites, large leukocytes, many bacteria, trace hematuria. Urine culture sent. CXR shows borderline cardiomegaly, otherwise no active disease. Ceftriaxone initiated in ED with 1 L fluid bolus. I consulted the hospitalist who will admit the patient for observation and further evaluation and treatment. Nausea controlled with Zofran in ED.   Final diagnoses:  UTI (lower urinary tract infection)  Other fatigue  Nausea  Diarrhea, unspecified type        Frederica Kuster, PA-C 04/10/16 1549  Dorie Rank, MD 04/10/16 [phone]

## 2016-04-10 NOTE — ED Notes (Addendum)
Pts states she started having left lower abd pain yesterday with diarrhea x2 yesterday. Pt states last night she had a fever of 101 and took motrin. Pt sent here by [facility] physicians for further work up. Pt also reports being weak all over since this am, difficulty walking around with cane.

## 2016-04-11 ENCOUNTER — Encounter (HOSPITAL_COMMUNITY): Payer: Self-pay | Admitting: Radiology

## 2016-04-11 ENCOUNTER — Observation Stay (HOSPITAL_COMMUNITY): Payer: Medicare Other

## 2016-04-11 DIAGNOSIS — I712 Thoracic aortic aneurysm, without rupture: Secondary | ICD-10-CM | POA: Diagnosis not present

## 2016-04-11 DIAGNOSIS — J9601 Acute respiratory failure with hypoxia: Secondary | ICD-10-CM | POA: Diagnosis not present

## 2016-04-11 DIAGNOSIS — I10 Essential (primary) hypertension: Secondary | ICD-10-CM

## 2016-04-11 DIAGNOSIS — R531 Weakness: Secondary | ICD-10-CM | POA: Diagnosis not present

## 2016-04-11 DIAGNOSIS — N390 Urinary tract infection, site not specified: Secondary | ICD-10-CM

## 2016-04-11 DIAGNOSIS — Z86711 Personal history of pulmonary embolism: Secondary | ICD-10-CM

## 2016-04-11 LAB — HEMOGLOBIN A1C
HEMOGLOBIN A1C: 6.4 % — AB (ref 4.8–5.6)
MEAN PLASMA GLUCOSE: 137 mg/dL

## 2016-04-11 LAB — CBC
HCT: 38.6 % (ref 36.0–46.0)
HEMOGLOBIN: 11.4 g/dL — AB (ref 12.0–15.0)
MCH: 27.5 pg (ref 26.0–34.0)
MCHC: 29.5 g/dL — AB (ref 30.0–36.0)
MCV: 93 fL (ref 78.0–100.0)
PLATELETS: 103 10*3/uL — AB (ref 150–400)
RBC: 4.15 MIL/uL (ref 3.87–5.11)
RDW: 16.2 % — AB (ref 11.5–15.5)
WBC: 8.4 10*3/uL (ref 4.0–10.5)

## 2016-04-11 LAB — BASIC METABOLIC PANEL
ANION GAP: 7 (ref 5–15)
BUN: 12 mg/dL (ref 6–20)
CALCIUM: 8.3 mg/dL — AB (ref 8.9–10.3)
CO2: 26 mmol/L (ref 22–32)
CREATININE: 0.67 mg/dL (ref 0.44–1.00)
Chloride: 103 mmol/L (ref 101–111)
GFR calc Af Amer: >60 mL/min (ref >60)
GLUCOSE: 126 mg/dL — AB (ref 65–99)
Potassium: 4 mmol/L (ref 3.5–5.1)
Sodium: 136 mmol/L (ref 135–145)

## 2016-04-11 LAB — GLUCOSE, CAPILLARY
GLUCOSE-CAPILLARY: 118 mg/dL — AB (ref 65–99)
GLUCOSE-CAPILLARY: 121 mg/dL — AB (ref 65–99)
Glucose-Capillary: 116 mg/dL — ABNORMAL HIGH (ref 65–99)
Glucose-Capillary: 128 mg/dL — ABNORMAL HIGH (ref 65–99)
Glucose-Capillary: 149 mg/dL — ABNORMAL HIGH (ref 65–99)

## 2016-04-11 LAB — MRSA PCR SCREENING: MRSA BY PCR: NEGATIVE

## 2016-04-11 MED ORDER — GLUCERNA SHAKE PO LIQD
237.0000 mL | Freq: Every day | ORAL | Status: DC
  Administered 2016-04-11: 237 mL via ORAL

## 2016-04-11 MED ORDER — IOPAMIDOL (ISOVUE-370) INJECTION 76%
INTRAVENOUS | Status: AC
  Administered 2016-04-11: 80 mL
  Filled 2016-04-11: qty 100

## 2016-04-11 NOTE — Evaluation (Signed)
Physical Therapy Evaluation Patient Details Name: Natasha Glass MRN: [id number] DOB: [birth year] Today's Date: 04/11/2016   History of Present Illness  Admitted with UTI and weakness. PMH significant for: DM, B TKA, obesity, HTN  Clinical Impression  Pt admitted with above diagnosis. Pt currently with functional limitations due to the deficits listed below (see PT Problem List).  Pt will benefit from skilled PT to increase their independence and safety with mobility to allow discharge to home again. Pt feels a little weaker than normal but close to baseline for mobility. Oxygen sats ranged from 91%-95% on RA when ambulating and then sitting EOB. Pt agreeable to HHPT and feels she can manage at home with her daughter helping occasionally. Pt very pleasant and motivated.         Follow Up Recommendations Home health PT;Supervision - Intermittent    Equipment Recommendations  None recommended by PT    Recommendations for Other Services       Precautions / Restrictions Precautions Precautions: Fall Restrictions Weight Bearing Restrictions: No      Mobility  Bed Mobility Overal bed mobility: Needs Assistance Bed Mobility: Supine to Sit;Sit to Supine     Supine to sit: Supervision Sit to supine: Min assist (Needed assist with LE's)   General bed mobility comments: used rail. Pt reports she has no difficulty with bed mobility at home   Transfers Overall transfer level: Needs assistance Equipment used: None Transfers: Sit to/from Stand Sit to Stand: Supervision         General transfer comment: Pt did not require physical assist to stand  Ambulation/Gait Ambulation/Gait assistance: Min guard Ambulation Distance (Feet): 120 Feet Assistive device: Straight cane Gait Pattern/deviations: Step-to pattern;Decreased stride length;Wide base of support;Shuffle   Gait velocity interpretation: Below normal speed for age/gender General Gait Details: Pt required 2 standing rest  breaks while walking  Stairs            Wheelchair Mobility    Modified Rankin (Stroke Patients Only)       Balance Overall balance assessment: History of Falls (Pt reports two falls due to tripping over the last 2 years)                                           Pertinent Vitals/Pain Pain Assessment: No/denies pain    Home Living Family/patient expects to be discharged to:: Private residence Living Arrangements: Alone Available Help at Discharge: Family;Available PRN/intermittently Type of Home: House Home Access: Stairs to enter   [employer] of Steps: 1 (primarily uses garage entry with 1 small step) Home Layout: One level Home Equipment: Walker - 2 wheels;Cane - single point Additional Comments: uses cane for ambulation typically    Prior Function Level of Independence: Independent with assistive device(s)         Comments: Family cares for the yard. Pt does own [employer] care including cooking and basic housework     Hand Dominance        Extremity/Trunk Assessment   Upper Extremity Assessment: Overall WFL for tasks assessed           Lower Extremity Assessment: Generalized weakness      Cervical / Trunk Assessment: Normal  Communication   Communication: No difficulties  Cognition Arousal/Alertness: Awake/alert Behavior During Therapy: WFL for tasks assessed/performed Overall Cognitive Status: Within Functional Limits for tasks assessed  General Comments General comments (skin integrity, edema, etc.): No family present for eval.  Pt feels weaker than normal and feels like she could manage at home with her daughter helping out some. Pt reports she was gardening some PTA    Exercises        Assessment/Plan    PT Assessment Patient needs continued PT services  PT Diagnosis Difficulty walking   PT Problem List Decreased mobility;Decreased balance;Cardiopulmonary status limiting  activity;Obesity;Decreased strength  PT Treatment Interventions Gait training;Therapeutic activities;Therapeutic exercise;Balance training;Patient/family education   PT Goals (Current goals can be found in the Care Plan section) Acute Rehab PT Goals Patient Stated Goal: to return home soon PT Goal Formulation: With patient Time For Goal Achievement: 04/18/16 Potential to Achieve Goals: Good    Frequency Min 3X/week   Barriers to discharge        Co-evaluation               End of Session Equipment Utilized During Treatment: Gait belt Activity Tolerance: Patient tolerated treatment well Patient left: in bed;with call bell/phone within reach;with family/visitor present Nurse Communication: Mobility status    Functional Assessment Tool Used: Clinical judgement Functional Limitation: Mobility: Walking and moving around Mobility: Walking and Moving Around Current Status [phone]): At least 1 percent but less than 20 percent impaired, limited or restricted Mobility: Walking and Moving Around Goal Status [phone]): 0 percent impaired, limited or restricted    Time: 1455-1520 PT Time Calculation (min) (ACUTE ONLY): 25 min   Charges:   PT Evaluation $PT Eval Moderate Complexity: 1 Procedure     PT G Codes:   PT G-Codes **NOT FOR INPATIENT CLASS** Functional Assessment Tool Used: Clinical judgement Functional Limitation: Mobility: Walking and moving around Mobility: Walking and Moving Around Current Status ([id number]): At least 1 percent but less than 20 percent impaired, limited or restricted Mobility: Walking and Moving Around Goal Status [phone]): 0 percent impaired, limited or restricted    Melvern Banker 04/11/2016, 3:48 PM Lavonia Dana, [place] 04/11/2016

## 2016-04-11 NOTE — Plan of Care (Signed)
Problem: Food- and Nutrition-Related Knowledge Deficit (NB-1.1) Goal: Nutrition education Formal process to instruct or train a patient/client in a skill or to impart knowledge to help patients/clients voluntarily manage or modify food choices and eating behavior to maintain or improve health. Outcome: Completed/Met Date Met:  04/11/16  RD consulted for nutrition education regarding weight loss.  RD provided "Weight Loss Tips" handout from the Academy of [employer]etics. Emphasized the importance of serving sizes and provided examples of correct portions of common foods. Discussed importance of controlled and consistent intake throughout the day. Provided examples of ways to balance meals/snacks and encouraged intake of high-fiber, whole grain complex carbohydrates. Emphasized the importance of hydration with calorie-free beverages and limiting sugar-sweetened beverages.Teach back method used.  Expect good compliance.  Corrin Parker, MS, RD, LDN Pager # [phone] After hours/ weekend pager # [phone]

## 2016-04-11 NOTE — Care Management Obs Status (Signed)
[place] NOTIFICATION   Patient Details  Name: Natasha Glass MRN: [id number] Date of Birth: Oct 16, 1940   Medicare Observation Status Notification Given:  Yes    Renette Hsu, Rory Percy, RN 04/11/2016, 2:39 PM

## 2016-04-11 NOTE — Progress Notes (Signed)
[facility] Progress Note  Patient: Natasha Glass HDQ:[id number]   PCP: Henrine Screws, MD DOB: [birth year]   DOA: 04/10/2016   DOS: 04/11/2016   Date of Service: the patient was seen and examined on 04/11/2016  Subjective: Patient continuous to complain of shortness of breath talking in short sentences. Denies being on any oxygen as being on prior pneumonia or CHF. Complaints of left-sided pleuritic chest pain. Also completed a fever with chills but no cough. Also because of bilateral leg swelling Nutrition: Tolerating oral diet  Brief hospital course: Pt. with PMH of PE, AAA, DM, HTN, obesity; admitted on 04/10/2016, with complaint of shortness of breath and fever chills, was found to have UTI as well as pneumonia. Currently further plan is continue IV antibiotics..  Assessment and Plan: 1. Acute respiratory failure with hypoxia (HCC) Patient required 2 L of oxygen. Improvement with initiation of the incentive spirometry. CT PE protocol is negative for pulmonary embolism. Shows bilateral basal atelectasis. No evidence of pneumonia as well. We'll continue to closely monitor. With patient's profound dyspnea I will prefer to get echocardiogram as an inpatient.  2. Suspected UTI. Patient is on ceftriaxone. Next and monitor culture. No evidence of sepsis.  3. Essential hypertension. Next on continuing home medication other than Lasix.  4. Ulcerative colitis. Continue 5-ASA  5. AAA. No change in the size of the AAA 4.5 cm. No evidence of rupture on CT scan. We will continue to monitor  Pain management: When necessary Tylenol Activity: Home health as per physical therapy Bowel regimen: last BM 04/11/2016 Diet: Cardiac diet DVT Prophylaxis: subcutaneous Heparin  Advance goals of care discussion: Full code  Family Communication: no family was present at bedside, at the time of interview.   Disposition:  Discharge to home with home health. Expected discharge date:  04/12/2016, pending echocardiogram  Consultants: None Procedures: None  Antibiotics: Anti-infectives    Start     Dose/Rate Route Frequency Ordered Stop   04/10/16 1500  cefTRIAXone (ROCEPHIN) 1 g in dextrose 5 % 50 mL IVPB     1 g 100 mL/hr over 30 Minutes Intravenous Every 24 hours 04/10/16 1500          Intake/Output Summary (Last 24 hours) at 04/11/16 1833 Last data filed at 04/11/16 1810  Gross per 24 hour  Intake  797.5 ml  Output      0 ml  Net  797.5 ml   Filed Weights   04/10/16 1202 04/10/16 1742 04/10/16 2100  Weight: 141.069 kg (311 lb) 141.4 kg (311 lb 11.7 oz) 142.5 kg (314 lb 2.5 oz)    Objective: Physical Exam: Filed Vitals:   04/11/16 0157 04/11/16 0546 04/11/16 0744 04/11/16 1704  BP:  134/61 131/61 118/87  Pulse:  77 76 73  Temp: 98.9 F (37.2 C) 100 F (37.8 C) 99.9 F (37.7 C) 98.4 F (36.9 C)  TempSrc: Oral Oral Oral Oral  Resp:  21 22 18   Height:      Weight:      SpO2:  97% 98% 94%    General: Alert, Awake and Oriented to Time, Place and Person. Appear in mild distress Eyes: PERRL, Conjunctiva normal ENT: Oral Mucosa clear moist. Neck: difficult to assess  JVD, no Abnormal Mass Or lumps Cardiovascular: S1 and S2 Present, no Murmur, Respiratory: Bilateral Air entry equal and Decreased, bilateral basal crackles, nowheezes Abdomen: Bowel Sound present, Soft and no tenderness Skin: no redness, no Rash  Extremities: bilateral Pedal edema, no calf tenderness Neurologic:  Grossly no focal neuro deficit. Bilaterally Equal motor strength  Data Reviewed: CBC:  Recent Labs Lab 04/10/16 1210 04/11/16 0437  WBC 8.7 8.4  NEUTROABS 7.1  --   HGB 12.6 11.4*  HCT 40.1 38.6  MCV 90.9 93.0  PLT 135* 496*   Basic Metabolic Panel:  Recent Labs Lab 04/10/16 1210 04/11/16 0437  NA 138 136  K 3.8 4.0  CL 103 103  CO2 26 26  GLUCOSE 156* 126*  BUN 13 12  CREATININE 0.62 0.67  CALCIUM 9.1 8.3*    Liver Function Tests:  Recent  Labs Lab 04/10/16 1210  AST 40  ALT 46  ALKPHOS 64  BILITOT 0.9  PROT 6.9  ALBUMIN 3.4*    Recent Labs Lab 04/10/16 1210  LIPASE 18   No results for input(s): AMMONIA in the last 168 hours. Coagulation Profile: No results for input(s): INR, PROTIME in the last 168 hours. Cardiac Enzymes: No results for input(s): CKTOTAL, CKMB, CKMBINDEX, TROPONINI in the last 168 hours. BNP (last 3 results) No results for input(s): PROBNP in the last 8760 hours.  CBG:  Recent Labs Lab 04/10/16 1749 04/11/16 0158 04/11/16 0740 04/11/16 1231 04/11/16 1703  GLUCAP 117* 118* 116* 121* 128*    Studies: Ct Angio Chest Pe W Or Wo Contrast  04/11/2016  CLINICAL DATA:  Shortness of breath, wheezing, chest pain EXAM: CT ANGIOGRAPHY CHEST WITH CONTRAST TECHNIQUE: Multidetector CT imaging of the chest was performed using the standard protocol during bolus administration of intravenous contrast. Multiplanar CT image reconstructions and MIPs were obtained to evaluate the vascular anatomy. CONTRAST:  80 cc Isovue COMPARISON:  CT chest 06/07/2015 FINDINGS: Cardiovascular: Again noted aneurysm of ascending aorta measures 4.5 cm in diameter. With there is no change from prior exam. No pulmonary embolus is noted. Heart size is stable. No pericardial effusion. Mild calcifications are noted mitral valve. Mediastinum/Nodes: No mediastinal hematoma or adenopathy. No hilar adenopathy. Lungs/Pleura: No pleural effusion. Mild dependent atelectasis noted bilateral lower lobe posteriorly. There is no infiltrate or pulmonary edema. No focal consolidation. No pneumothorax. 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 Visualized pancreas is atrophic. Visualized liver and spleen is unremarkable. Small hiatal hernia. Musculoskeletal: No destructive bony lesions are noted. Sagittal images of the spine shows degenerative changes mid and lower thoracic spine.  Sagittal view of the sternum is unremarkable. Review of the MIP images confirms the above findings. IMPRESSION: 1. No pulmonary embolus is noted. 2. 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 3. No mediastinal hematoma or adenopathy. 4. Degenerative changes mid and lower thoracic spine. 5. No acute infiltrate or pulmonary edema. Mild dependent atelectasis noted bilateral lower lobe posteriorly. Electronically Signed   By: Lahoma Crocker M.D.   On: 04/11/2016 11:22     Scheduled Meds: . allopurinol  300 mg Oral Daily  . aspirin EC  81 mg Oral QHS  . cefTRIAXone (ROCEPHIN)  IV  1 g Intravenous Q24H  . enoxaparin (LOVENOX) injection  40 mg Subcutaneous Q24H  . feeding supplement (GLUCERNA SHAKE)  237 mL Oral Q1500  . insulin aspart  0-15 Units Subcutaneous TID WC  . insulin aspart  0-5 Units Subcutaneous QHS  . losartan  100 mg Oral Daily  . metoprolol tartrate  25 mg Oral BID  . sulfaSALAzine  500 mg Oral QID   Continuous Infusions:  PRN Meds: acetaminophen, naphazoline-glycerin, ondansetron **OR** ondansetron (ZOFRAN) IV  Time spent: 30 minutes  Author: Berle Mull, MD [facility] Pager: [phone] 04/11/2016 6:33 PM  If 7PM-7AM, please contact night-coverage at [email or web address], password [facility]

## 2016-04-11 NOTE — Progress Notes (Signed)
Initial Nutrition Assessment  DOCUMENTATION CODES:   Morbid obesity  INTERVENTION:  Provide Glucerna Shake po once daily, each supplement provides 220 kcal and 10 grams of protein   Encourage adequate PO intake.   RD to continue to monitor for needs.   Diet education given.   NUTRITION DIAGNOSIS:   Inadequate oral intake related to poor appetite as evidenced by per patient/family report.  GOAL:   Patient will meet greater than or equal to 90% of their needs  MONITOR:   PO intake, Weight trends, Labs, I & O's  REASON FOR ASSESSMENT:   Consult Assessment of nutrition requirement/status  ASSESSMENT:   75 y.o. female with medical history significant for obesity, diabetes, hypertension, aortic aneurysm, ulcerative colitis presents to the emergency department with chief complaint of persistent chills, subjective fever, nausea without vomiting. Initial evaluation in the emergency department reveals a urinary tract infection.  Meal completion has been 75%. Pt reports having a decreased appetite which has been ongoing since Tuesday 6/27. Pt reports she has only been eating bites at meals. Prior to her decreased appetite, pt reports eating fine with at least 3 meals a day with snacks in between. Weight has been stable per report. RD to order nutritional supplementation to aid in protein needs. RD additionally gave diet education.  Pt with no observed significant fat or muscle mass loss.   Labs and medications reviewed.   Diet Order:  Diet Heart Room service appropriate?: Yes; Fluid consistency:: Thin  Skin:  Reviewed, no issues  Last BM:  6/27  Height:   Ht Readings from Last 1 Encounters:  04/10/16 5' 6"  (1.676 m)    Weight:   Wt Readings from Last 1 Encounters:  04/10/16 314 lb 2.5 oz (142.5 kg)    Ideal Body Weight:  59 kg  BMI:  Body mass index is 50.73 kg/(m^2).  Estimated Nutritional Needs:   Kcal:  1800-2000  Protein:  95-115 grams  Fluid:  1.8 - 2  L/day  EDUCATION NEEDS:   Education needs addressed  Corrin Parker, MS, RD, LDN Pager # [phone] After hours/ weekend pager # [phone]

## 2016-04-12 ENCOUNTER — Observation Stay (HOSPITAL_BASED_OUTPATIENT_CLINIC_OR_DEPARTMENT_OTHER): Payer: Medicare Other

## 2016-04-12 DIAGNOSIS — J9601 Acute respiratory failure with hypoxia: Secondary | ICD-10-CM | POA: Diagnosis not present

## 2016-04-12 DIAGNOSIS — R0600 Dyspnea, unspecified: Secondary | ICD-10-CM | POA: Diagnosis not present

## 2016-04-12 DIAGNOSIS — R531 Weakness: Secondary | ICD-10-CM | POA: Diagnosis not present

## 2016-04-12 DIAGNOSIS — N390 Urinary tract infection, site not specified: Secondary | ICD-10-CM | POA: Diagnosis not present

## 2016-04-12 DIAGNOSIS — I712 Thoracic aortic aneurysm, without rupture: Secondary | ICD-10-CM | POA: Diagnosis not present

## 2016-04-12 DIAGNOSIS — E669 Obesity, unspecified: Secondary | ICD-10-CM

## 2016-04-12 LAB — BASIC METABOLIC PANEL
Anion gap: 7 (ref 5–15)
BUN: 12 mg/dL (ref 6–20)
CHLORIDE: 103 mmol/L (ref 101–111)
CO2: 26 mmol/L (ref 22–32)
CREATININE: 0.62 mg/dL (ref 0.44–1.00)
Calcium: 8.5 mg/dL — ABNORMAL LOW (ref 8.9–10.3)
GFR calc non Af Amer: >60 mL/min (ref >60)
GLUCOSE: 124 mg/dL — AB (ref 65–99)
Potassium: 3.9 mmol/L (ref 3.5–5.1)
Sodium: 136 mmol/L (ref 135–145)

## 2016-04-12 LAB — ECHOCARDIOGRAM COMPLETE
E decel time: 264 msec
EERAT: 21.94
FS: 27 % — AB (ref 28–44)
HEIGHTINCHES: 66 in
IV/PV OW: 0.99
LA diam end sys: 51 mm
LA diam index: 2.11 cm/m2
LA vol A4C: 68.6 ml
LASIZE: 51 mm
LAVOL: 72 mL
LAVOLIN: 29.8 mL/m2
LV e' LATERAL: 6.2 cm/s
LVEEAVG: 21.94
LVEEMED: 21.94
LVOT area: 3.46 cm2
LVOT diameter: 21 mm
MV Dec: 264
MVPG: 7 mmHg
MVPKAVEL: 90.6 m/s
MVPKEVEL: 136 m/s
PW: 11.4 mm — AB (ref 0.6–1.1)
TAPSE: 22.2 mm
TDI e' lateral: 6.2
TDI e' medial: 7.18
WEIGHTICAEL: 5026.49 [oz_av]

## 2016-04-12 LAB — CBC
HCT: 39.5 % (ref 36.0–46.0)
Hemoglobin: 11.8 g/dL — ABNORMAL LOW (ref 12.0–15.0)
MCH: 27.9 pg (ref 26.0–34.0)
MCHC: 29.9 g/dL — AB (ref 30.0–36.0)
MCV: 93.4 fL (ref 78.0–100.0)
PLATELETS: 110 10*3/uL — AB (ref 150–400)
RBC: 4.23 MIL/uL (ref 3.87–5.11)
RDW: 16.1 % — ABNORMAL HIGH (ref 11.5–15.5)
WBC: 4.9 10*3/uL (ref 4.0–10.5)

## 2016-04-12 LAB — GLUCOSE, CAPILLARY
Glucose-Capillary: 116 mg/dL — ABNORMAL HIGH (ref 65–99)
Glucose-Capillary: 216 mg/dL — ABNORMAL HIGH (ref 65–99)

## 2016-04-12 MED ORDER — CEPHALEXIN 500 MG PO CAPS: 500.0000 mg | ORAL_CAPSULE | Freq: Two times a day (BID) | ORAL | Status: AC

## 2016-04-12 MED ORDER — GLUCERNA SHAKE PO LIQD: 237.0000 mL | Freq: Every day | ORAL | Status: DC

## 2016-04-12 MED ORDER — HYDROCODONE-ACETAMINOPHEN 5-325 MG PO TABS
1.0000 | ORAL_TABLET | Freq: Once | ORAL | Status: AC
  Administered 2016-04-12: 1 via ORAL
  Filled 2016-04-12: qty 1

## 2016-04-12 NOTE — Progress Notes (Signed)
  Echocardiogram 2D Echocardiogram has been performed.  Natasha Glass M 04/12/2016, 10:20 AM

## 2016-04-12 NOTE — Progress Notes (Signed)
Patient Discharged home with belongings in tow. Education and instructions reviewed, patient understood. Family here to pick up patient. Prescriptions in hand. Patient left in wheelchair in stable condition.

## 2016-04-12 NOTE — Care Management Note (Signed)
Case Management Note  Patient Details  Name: Natasha Glass MRN: [id number] Date of Birth: [birth year]  Subjective/Objective:         CM following for progression and d/c planning.           Action/Plan: 04/12/2016 Met with pt ,she has used [facility] previously, [place] notified of need for [facility] services. Pt states that she has needed DME. No further needs identified.  Expected Discharge Date:      04/12/2016            Expected Discharge Plan:  [place]  In-House Referral:  NA  Discharge planning Services  CM Consult  Post Acute Care Choice:  Home Health Choice offered to:  Patient  DME Arranged:  N/A DME Agency:  NA  HH Arranged:  PT [place] Agency:  [place]  Status of Service:  Completed, signed off  If discussed at [place] of Stay Meetings, dates discussed:    Additional Comments:  Adron Bene, RN 04/12/2016, 1:14 PM

## 2016-04-13 LAB — URINE CULTURE: Culture: >=100000 — AB

## 2016-04-13 NOTE — Discharge Summary (Signed)
[facility] Discharge Summary   Patient: Natasha Glass YWV:[id number]   PCP: Henrine Screws, MD DOB: 1941/01/02   Date of admission: 04/10/2016   Date of discharge: 04/12/2016    Discharge Diagnoses:  Principal Problem:   UTI (lower urinary tract infection) Active Problems:   Obesity   Hypertension   History of pulmonary embolism   Ascending aortic aneurysm (HCC)   Generalized weakness   Nausea   Diabetes ([place])   Acute respiratory failure with hypoxia ([place])  Admitted From: Home Disposition:  Home with home health  Recommendations for Outpatient Follow-up:  1. Follow-up with PCP in one week, may need a sleep apnea evaluation   Follow-up Information    Follow up with Natasha NEVILL, MD. Schedule an appointment as soon as possible for a visit in 1 week.   Specialty:  Internal Medicine   Contact information:   [address] [place]  [address] [phone]      Diet recommendation: Cardiac diet  Activity: The patient is advised to gradually reintroduce usual activities.  Discharge Condition: good  Code Status: Full code  History of present illness: As per the H and P dictated on admission, "Natasha Glass is a very pleasant 75 y.o. female with medical history significant for obesity, diabetes, hypertension, aortic aneurysm, ulcerative colitis presents to the emergency department with chief complaint of persistent chills, subjective fever, nausea without vomiting. Initial evaluation in the emergency department reveals a urinary tract infection.  Information is obtained from the patient and her son who is at the bedside. Patient reports 2 days ago she developed chills. Shortly thereafter she became nauseated and had 2 episodes of diarrhea. She denies melena or bright red blood per rectum. Associated symptoms include intermittent left lower quadrant pain. She describes the pain as cramp-like worse with bowel movements. She denies any emesis. She  does report a decreased oral intake for the last 2 days. She denies chest pain palpitation headache dizziness syncope or near-syncope. She denies dysuria hematuria frequency or urgency. She denies any worsening lower extremity edema or worsening shortness of breath. She has taken ibuprofen for her fever. Today she was no better she felt weak so she went to her primary care provider. [facility] provider recommended she come to the emergency department for further evaluation"  Hospital Course:  Summary of her active problems in the hospital is as following. 1. Acute respiratory failure with hypoxia (HCC) Patient required 2 L of oxygen on admission, Improvement with initiation of the incentive spirometry. Now on room air both at rest as well as on exertion. CT PE protocol is negative for pulmonary embolism. Shows bilateral basal atelectasis. No evidence of pneumonia as well. We'll continue to closely monitor. Echocardiogram showed normal EF of 55-60% with grade 2 diastolic dysfunction without any significant valvular abnormality.  2. UTI. Patient is on ceftriaxone. Urine culture growing gram-negative rods. Will discharge with oral Keflex. No evidence of sepsis.  3. Essential hypertension.  continuing home medication  4. Ulcerative colitis. No evidence of active flare. Continue 5-ASA  5. AAA. No change in the size of the AAA 4.5 cm. No evidence of rupture on CT scan.  6. Morbid obesity. Patient may have obesity hypoventilation syndrome. And will benefit from sleep apnea assessment as an outpatient.  All other chronic medical condition were stable during the hospitalization.  Patient was seen by physical therapy, who recommended home health, which was arranged by Education officer, museum and case Freight forwarder. On the day of the  discharge the patient's vitals are stable, and no other acute medical condition were reported by patient. the patient was felt safe to be discharge at home with home  health.  Procedures and Results:  Echocardiogram  Study Conclusions  - Left ventricle: The cavity size was normal. Wall thickness was  normal. Systolic function was normal. The estimated ejection  fraction was in the range of 55% to 60%. Wall motion was normal;  there were no regional wall motion abnormalities. Features are  consistent with a pseudonormal left ventricular filling pattern,  with concomitant abnormal relaxation and increased filling  pressure (grade 2 diastolic dysfunction).  Consultations:  None  DISCHARGE MEDICATION: Discharge Medication List as of 04/12/2016  2:53 PM    START taking these medications   Details  cephALEXin (KEFLEX) 500 MG capsule Take 1 capsule (500 mg total) by mouth 2 (two) times daily., Starting 04/12/2016, Until Sun 04/14/16, Normal    feeding supplement, GLUCERNA SHAKE, (GLUCERNA SHAKE) LIQD Take 237 mLs by mouth daily at 3 pm., Starting 04/12/2016, Until Discontinued, Normal      CONTINUE these medications which have NOT CHANGED   Details  allopurinol (ZYLOPRIM) 300 MG tablet Take 300 mg by mouth daily. , Starting 04/19/2014, Until Discontinued, Historical Med    aspirin 81 MG tablet Take 81 mg by mouth at bedtime. , Until Discontinued, Historical Med    Cholecalciferol (VITAMIN D) 2000 UNITS tablet Take 2,000 Units by mouth every morning. , Until Discontinued, Historical Med    furosemide (LASIX) 40 MG tablet Take 40 mg by mouth daily as needed for fluid. , Until Discontinued, Historical Med    losartan (COZAAR) 100 MG tablet Take 100 mg by mouth daily., Until Discontinued, Historical Med    Magnesium 250 MG TABS Take 250 mg by mouth daily. , Until Discontinued, Historical Med    metFORMIN (GLUCOPHAGE) 500 MG tablet Take 500 mg by mouth 2 (two) times daily with a meal. , Starting 04/19/2014, Until Discontinued, Historical Med    metoprolol tartrate (LOPRESSOR) 25 MG tablet Take 25 mg by mouth 2 (two) times daily., Until Discontinued,  Historical Med    Multiple Vitamins-Minerals (PRESERVISION AREDS 2) CAPS Take 2 capsules by mouth daily., Until Discontinued, Historical Med    Omega-3 Fatty Acids (FISH OIL) 1000 MG CAPS Take 2,000 mg by mouth daily. , Until Discontinued, Historical Med    omeprazole (PRILOSEC) 20 MG capsule Take 1 capsule (20 mg total) by mouth daily., Starting 06/13/2014, Until Discontinued, Print    sulfaSALAzine (AZULFIDINE) 500 MG tablet Take 500 mg by mouth 4 (four) times daily., Until Discontinued, Historical Med    tetrahydrozoline 0.05 % ophthalmic solution Place 1 drop into both eyes daily as needed (for dry eyes)., Until Discontinued, Historical Med      STOP taking these medications     ibuprofen (ADVIL,MOTRIN) 200 MG tablet        Allergies  Allergen Reactions  . Morphine And Related Other (See Comments)    Unresponsive-Very bad reaction.  Can take hydrocodone.   [name] Allergy] Shortness Of Breath, Itching, Swelling and Rash  . Amlodipine Swelling    Of legs  . Percocet [Oxycodone-Acetaminophen] Other (See Comments)    hallucinations  . Tdap [Diphth-Acell Pertussis-Tetanus] Itching, Swelling and Rash  . Tetanus Toxoids Itching, Swelling and Rash  . Tramadol Swelling    Of legs  . Diovan [Valsartan] Other (See Comments)    Cough  . Diphtheria Toxoid Itching, Swelling and Rash  . Macrodantin [  Nitrofurantoin Macrocrystal] Other (See Comments)    unspecified   Discharge Instructions    Diet - low sodium heart healthy    Complete by:  As directed      Discharge instructions    Complete by:  As directed   It is important that you read following instructions as well as go over your medication list with RN to help you understand your care after this hospitalization.  Discharge Instructions: Please follow-up with PCP in one week  Please request your primary care physician to go over all Hospital Tests and Procedure/Radiological results at the follow up,  Please get  all Hospital records sent to your PCP by signing hospital release before you go home.   Do not take more than prescribed Pain, Sleep and Anxiety Medications. You were cared for by a hospitalist during your hospital stay. If you have any questions about your discharge medications or the care you received while you were in the hospital after you are discharged, you can call the unit and ask to speak with the hospitalist on call if the hospitalist that took care of you is not available.  Once you are discharged, your primary care physician will handle any further medical issues. Please note that NO REFILLS for any discharge medications will be authorized once you are discharged, as it is imperative that you return to your primary care physician (or establish a relationship with a primary care physician if you do not have one) for your aftercare needs so that they can reassess your need for medications and monitor your lab values. You Must read complete instructions/literature along with all the possible adverse reactions/side effects for all the Medicines you take and that have been prescribed to you. Take any new Medicines after you have completely understood and accept all the possible adverse reactions/side effects. Wear Seat belts while driving.     Incentive spirometry RT    Complete by:  As directed      Increase activity slowly    Complete by:  As directed           Discharge Exam: Filed Weights   04/10/16 1202 04/10/16 1742 04/10/16 2100  Weight: 141.069 kg (311 lb) 141.4 kg (311 lb 11.7 oz) 142.5 kg (314 lb 2.5 oz)   Filed Vitals:   04/12/16 0553 04/12/16 0744  BP: 120/66 129/68  Pulse: 69 67  Temp: 97.7 F (36.5 C) 97.7 F (36.5 C)  Resp: 20 18   General: Appear in no distress, no Rash; Oral Mucosa moist. Cardiovascular: S1 and S2 Present, no Murmur, difficult to assess JVD Respiratory: Bilateral Air entry present and Clear to Auscultation, no Crackles, no wheezes Abdomen:  Bowel Sound present, Soft and no tenderness Extremities: no Pedal edema, no calf tenderness Neurology: Grossly no focal neuro deficit.  The results of significant diagnostics from this hospitalization (including imaging, microbiology, ancillary and laboratory) are listed below for reference.    Significant Diagnostic Studies: Dg Chest 2 View  04/10/2016  CLINICAL DATA:  Shortness of breath for 2 days.  Fever. EXAM: CHEST  2 VIEW COMPARISON:  06/13/2014 FINDINGS: Heart is borderline in size. No confluent airspace opacities or effusions. No edema. No acute bony abnormality. IMPRESSION: Borderline cardiomegaly.  No active disease. Electronically Signed   By: Rolm Baptise M.D.   On: 04/10/2016 13:30   Ct Angio Chest Pe W Or Wo Contrast  04/11/2016  CLINICAL DATA:  Shortness of breath, wheezing, chest pain EXAM: CT ANGIOGRAPHY CHEST WITH CONTRAST  TECHNIQUE: Multidetector CT imaging of the chest was performed using the standard protocol during bolus administration of intravenous contrast. Multiplanar CT image reconstructions and MIPs were obtained to evaluate the vascular anatomy. CONTRAST:  80 cc Isovue COMPARISON:  CT chest 06/07/2015 FINDINGS: Cardiovascular: Again noted aneurysm of ascending aorta measures 4.5 cm in diameter. With there is no change from prior exam. No pulmonary embolus is noted. Heart size is stable. No pericardial effusion. Mild calcifications are noted mitral valve. Mediastinum/Nodes: No mediastinal hematoma or adenopathy. No hilar adenopathy. Lungs/Pleura: No pleural effusion. Mild dependent atelectasis noted bilateral lower lobe posteriorly. There is no infiltrate or pulmonary edema. No focal consolidation. No pneumothorax. 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 Visualized pancreas is atrophic. Visualized liver and spleen is unremarkable. Small hiatal hernia. Musculoskeletal: No destructive bony lesions are noted.  Sagittal images of the spine shows degenerative changes mid and lower thoracic spine. Sagittal view of the sternum is unremarkable. Review of the MIP images confirms the above findings. IMPRESSION: 1. No pulmonary embolus is noted. 2. 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 3. No mediastinal hematoma or adenopathy. 4. Degenerative changes mid and lower thoracic spine. 5. No acute infiltrate or pulmonary edema. Mild dependent atelectasis noted bilateral lower lobe posteriorly. Electronically Signed   By: Lahoma Crocker M.D.   On: 04/11/2016 11:22    Microbiology: Recent Results (from the past 240 hour(s))  Urine culture     Status: Abnormal (Preliminary result)   Collection Time: 04/10/16  2:08 PM  Result Value Ref Range Status   Specimen Description URINE, CLEAN CATCH  Final   Special Requests NONE  Final   Culture >=100,000 COLONIES/mL GRAM NEGATIVE RODS (A)  Final   Report Status PENDING  Incomplete  MRSA PCR Screening     Status: None   Collection Time: 04/11/16  2:18 AM  Result Value Ref Range Status   MRSA by PCR NEGATIVE NEGATIVE Final    Comment:        The GeneXpert MRSA Assay (FDA approved for NASAL specimens only), is one component of a comprehensive MRSA colonization surveillance program. It is not intended to diagnose MRSA infection nor to guide or monitor treatment for MRSA infections.      Labs: CBC:  Recent Labs Lab 04/10/16 1210 04/11/16 0437 04/12/16 0753  WBC 8.7 8.4 4.9  NEUTROABS 7.1  --   --   HGB 12.6 11.4* 11.8*  HCT 40.1 38.6 39.5  MCV 90.9 93.0 93.4  PLT 135* 103* 809*   Basic Metabolic Panel:  Recent Labs Lab 04/10/16 1210 04/11/16 0437 04/12/16 0753  NA 138 136 136  K 3.8 4.0 3.9  CL 103 103 103  CO2 26 26 26   GLUCOSE 156* 126* 124*  BUN 13 12 12   CREATININE 0.62 0.67 0.62  CALCIUM 9.1 8.3* 8.5*   Liver Function Tests:  Recent Labs Lab 04/10/16 1210  AST 40  ALT 46  ALKPHOS 64  BILITOT 0.9  PROT 6.9    ALBUMIN 3.4*    Recent Labs Lab 04/10/16 1210  LIPASE 18    Recent Labs Lab 04/11/16 1231 04/11/16 1703 04/11/16 2008 04/12/16 0743 04/12/16 1134  GLUCAP 121* 128* 149* 116* 216*   Time spent: 30 minutes  Signed:  Ruari Mudgett  [facility] 04/12/2016 , 1:18 AM

## 2016-04-15 ENCOUNTER — Ambulatory Visit (HOSPITAL_COMMUNITY): Admission: RE | Admit: 2016-04-15 | Payer: Medicare Other | Source: Ambulatory Visit | Admitting: Gastroenterology

## 2016-04-15 HISTORY — DX: Unspecified urinary incontinence: R32

## 2016-04-15 HISTORY — DX: Gastro-esophageal reflux disease without esophagitis: K21.9

## 2016-04-15 SURGERY — COLONOSCOPY WITH PROPOFOL
Anesthesia: Monitor Anesthesia Care

## 2016-04-25 ENCOUNTER — Other Ambulatory Visit: Payer: Self-pay | Admitting: *Deleted

## 2016-04-25 DIAGNOSIS — I7121 Aneurysm of the ascending aorta, without rupture: Secondary | ICD-10-CM

## 2016-04-25 DIAGNOSIS — I712 Thoracic aortic aneurysm, without rupture: Secondary | ICD-10-CM

## 2016-06-12 ENCOUNTER — Ambulatory Visit (INDEPENDENT_AMBULATORY_CARE_PROVIDER_SITE_OTHER): Payer: Medicare Other | Admitting: Surgery

## 2016-06-12 ENCOUNTER — Other Ambulatory Visit: Payer: Medicare Other

## 2016-06-12 ENCOUNTER — Encounter: Payer: Self-pay | Admitting: Surgery

## 2016-06-12 VITALS — BP 150/79 | HR 80 | Resp 20 | Ht 66.0 in | Wt 314.0 lb

## 2016-06-12 DIAGNOSIS — I712 Thoracic aortic aneurysm, without rupture: Secondary | ICD-10-CM

## 2016-06-12 DIAGNOSIS — I7121 Aneurysm of the ascending aorta, without rupture: Secondary | ICD-10-CM

## 2016-06-14 ENCOUNTER — Encounter: Payer: Self-pay | Admitting: Surgery

## 2016-06-14 NOTE — Progress Notes (Signed)
HPI:  She returns for follow up of her ascending aortic aneurysm. An echo in 01/2009 had shown an aortic root diameter of 3.7 cm. CTA of the chest then showed that the ascending aorta was 4.4 cm. CTA of the chest in 2015 showed that it measured 5 cm but there was some artifact due to motion. The CT last year showed it to measure 4.5 cm. She has been been stable of the past year. 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  Current Outpatient Prescriptions  Medication Sig Dispense Refill  . allopurinol (ZYLOPRIM) 300 MG tablet Take 300 mg by mouth daily.     Marland Kitchen aspirin 81 MG tablet Take 81 mg by mouth at bedtime.     . Cholecalciferol (VITAMIN D) 2000 UNITS tablet Take 2,000 Units by mouth every morning.     . feeding supplement, GLUCERNA SHAKE, (GLUCERNA SHAKE) LIQD Take 237 mLs by mouth daily at 3 pm. 14 Can 0  . furosemide (LASIX) 40 MG tablet Take 40 mg by mouth daily as needed for fluid.     Marland Kitchen losartan (COZAAR) 100 MG tablet Take 100 mg by mouth daily.    . Magnesium 250 MG TABS Take 250 mg by mouth daily.     . metFORMIN (GLUCOPHAGE) 500 MG tablet Take 500 mg by mouth 2 (two) times daily with a meal.     . metoprolol tartrate (LOPRESSOR) 25 MG tablet Take 25 mg by mouth 2 (two) times daily.    . Multiple Vitamins-Minerals (PRESERVISION AREDS 2) CAPS Take 2 capsules by mouth daily.    . Omega-3 Fatty Acids (FISH OIL) 1000 MG CAPS Take 2,000 mg by mouth daily.     Marland Kitchen omeprazole (PRILOSEC) 20 MG capsule Take 1 capsule (20 mg total) by mouth daily. (Patient taking differently: Take 20 mg by mouth daily as needed. ) 20 capsule 0  . sulfaSALAzine (AZULFIDINE) 500 MG tablet Take 500 mg by mouth 4 (four) times daily.    Marland Kitchen tetrahydrozoline 0.05 % ophthalmic solution Place 1 drop into both eyes daily as needed (for dry eyes).     No current facility-administered medications for this visit.      Physical Exam: BP (!) 150/79    Pulse 80   Resp 20   Ht 5' 6"  (1.676 m)   Wt (!) 314 lb (142.4 kg)   SpO2 92%   BMI 50.68 kg/m  She is a morbidly obese woman in no distress Cardiac exam shows a regular rate and rhythm with normal heart sounds. There is no murmur. Lung exam is clear  Diagnostic Tests:  CLINICAL DATA:  Shortness of breath, wheezing, chest pain  EXAM: CT ANGIOGRAPHY CHEST WITH CONTRAST  TECHNIQUE: Multidetector CT imaging of the chest was performed using the standard protocol during bolus administration of intravenous contrast. Multiplanar CT image reconstructions and MIPs were obtained to evaluate the vascular anatomy.  CONTRAST:  80 cc Isovue  COMPARISON:  CT chest 06/07/2015  FINDINGS: Cardiovascular: Again noted aneurysm of ascending aorta measures 4.5 cm in diameter. With there is no change from prior exam. No pulmonary embolus is noted. Heart size is stable. No pericardial effusion. Mild calcifications are noted mitral valve.  Mediastinum/Nodes: No mediastinal hematoma or adenopathy. No hilar adenopathy.  Lungs/Pleura: No pleural effusion. Mild dependent atelectasis noted bilateral lower lobe posteriorly. There is no infiltrate or pulmonary edema. No focal consolidation. No pneumothorax.  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 Visualized pancreas is atrophic. Visualized liver and spleen is unremarkable. Small hiatal hernia.  Musculoskeletal: No destructive bony lesions are noted. Sagittal images of the spine shows degenerative changes mid and lower thoracic spine. Sagittal view of the sternum is unremarkable.  Review of the MIP images confirms the above findings.  IMPRESSION: 1. No pulmonary embolus is noted. 2. 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 3. No mediastinal hematoma or adenopathy. 4. Degenerative changes mid and lower thoracic spine. 5. No acute  infiltrate or pulmonary edema. Mild dependent atelectasis noted bilateral lower lobe posteriorly.   Electronically Signed   By: Lahoma Crocker M.D.   On: 04/11/2016 11:22   Impression:  I have personally reviewed and interpreted her CTA and reviewed the films with her. The ascending aortic aneurysm is stable at 4.5 cm. Her BP is elevated today but she says it is usually less than that. I stressed the importance of good blood pressure control.  Plan:  She will return in one year for a repeat CTA of the chest.   I spent 15 minutes evaluating this patient today and > 50% of this time was spent face to face counseling and coordinating the care of this patient's ascending aortic aneurysm.  Gaye Pollack, MD [facility] [phone]

## 2016-07-23 ENCOUNTER — Other Ambulatory Visit: Payer: Self-pay | Admitting: Gastroenterology

## 2016-07-24 ENCOUNTER — Ambulatory Visit (INDEPENDENT_AMBULATORY_CARE_PROVIDER_SITE_OTHER): Payer: Medicare Other | Admitting: Ophthalmology

## 2016-07-24 DIAGNOSIS — H35033 Hypertensive retinopathy, bilateral: Secondary | ICD-10-CM

## 2016-07-24 DIAGNOSIS — H43813 Vitreous degeneration, bilateral: Secondary | ICD-10-CM

## 2016-07-24 DIAGNOSIS — H353132 Nonexudative age-related macular degeneration, bilateral, intermediate dry stage: Secondary | ICD-10-CM

## 2016-07-24 DIAGNOSIS — I10 Essential (primary) hypertension: Secondary | ICD-10-CM

## 2016-07-24 DIAGNOSIS — H2513 Age-related nuclear cataract, bilateral: Secondary | ICD-10-CM

## 2016-08-28 ENCOUNTER — Encounter (HOSPITAL_COMMUNITY): Payer: Self-pay | Admitting: *Deleted

## 2016-09-10 ENCOUNTER — Ambulatory Visit (HOSPITAL_COMMUNITY): Payer: Medicare Other | Admitting: Anesthesiology

## 2016-09-10 ENCOUNTER — Encounter (HOSPITAL_COMMUNITY)
Admission: RE | Disposition: A | Discharge status: Home / Self Care | Payer: Self-pay | Source: Ambulatory Visit | Attending: Gastroenterology

## 2016-09-10 ENCOUNTER — Encounter (HOSPITAL_COMMUNITY): Payer: Self-pay

## 2016-09-10 ENCOUNTER — Ambulatory Visit (HOSPITAL_COMMUNITY)
Admission: RE | Admit: 2016-09-10 | Discharge: 2016-09-10 | Disposition: A | Discharge status: Home / Self Care | Payer: Medicare Other | Source: Ambulatory Visit | Attending: Gastroenterology | Admitting: Gastroenterology

## 2016-09-10 DIAGNOSIS — I712 Thoracic aortic aneurysm, without rupture: Secondary | ICD-10-CM | POA: Insufficient documentation

## 2016-09-10 DIAGNOSIS — Z7984 Long term (current) use of oral hypoglycemic drugs: Secondary | ICD-10-CM | POA: Diagnosis not present

## 2016-09-10 DIAGNOSIS — Z888 Allergy status to other drugs, medicaments and biological substances status: Secondary | ICD-10-CM | POA: Diagnosis not present

## 2016-09-10 DIAGNOSIS — K6389 Other specified diseases of intestine: Secondary | ICD-10-CM | POA: Insufficient documentation

## 2016-09-10 DIAGNOSIS — K219 Gastro-esophageal reflux disease without esophagitis: Secondary | ICD-10-CM | POA: Diagnosis not present

## 2016-09-10 DIAGNOSIS — E11319 Type 2 diabetes mellitus with unspecified diabetic retinopathy without macular edema: Secondary | ICD-10-CM | POA: Diagnosis not present

## 2016-09-10 DIAGNOSIS — Z887 Allergy status to serum and vaccine status: Secondary | ICD-10-CM | POA: Insufficient documentation

## 2016-09-10 DIAGNOSIS — E7800 Pure hypercholesterolemia, unspecified: Secondary | ICD-10-CM | POA: Insufficient documentation

## 2016-09-10 DIAGNOSIS — Z800 Family history of malignant neoplasm of digestive organs: Secondary | ICD-10-CM | POA: Insufficient documentation

## 2016-09-10 DIAGNOSIS — Z86718 Personal history of other venous thrombosis and embolism: Secondary | ICD-10-CM | POA: Insufficient documentation

## 2016-09-10 DIAGNOSIS — K5100 Ulcerative (chronic) pancolitis without complications: Secondary | ICD-10-CM | POA: Diagnosis present

## 2016-09-10 DIAGNOSIS — Z96653 Presence of artificial knee joint, bilateral: Secondary | ICD-10-CM | POA: Diagnosis not present

## 2016-09-10 DIAGNOSIS — G4733 Obstructive sleep apnea (adult) (pediatric): Secondary | ICD-10-CM | POA: Diagnosis not present

## 2016-09-10 DIAGNOSIS — Z7982 Long term (current) use of aspirin: Secondary | ICD-10-CM | POA: Insufficient documentation

## 2016-09-10 DIAGNOSIS — Z885 Allergy status to narcotic agent status: Secondary | ICD-10-CM | POA: Diagnosis not present

## 2016-09-10 DIAGNOSIS — Z91013 Allergy to seafood: Secondary | ICD-10-CM | POA: Diagnosis not present

## 2016-09-10 DIAGNOSIS — Z6841 Body Mass Index (BMI) 40.0 and over, adult: Secondary | ICD-10-CM | POA: Insufficient documentation

## 2016-09-10 DIAGNOSIS — M1990 Unspecified osteoarthritis, unspecified site: Secondary | ICD-10-CM | POA: Insufficient documentation

## 2016-09-10 DIAGNOSIS — I10 Essential (primary) hypertension: Secondary | ICD-10-CM | POA: Insufficient documentation

## 2016-09-10 DIAGNOSIS — H3530 Unspecified macular degeneration: Secondary | ICD-10-CM | POA: Insufficient documentation

## 2016-09-10 HISTORY — DX: Sleep apnea, unspecified: G47.30

## 2016-09-10 HISTORY — DX: Anemia, unspecified: D64.9

## 2016-09-10 HISTORY — PX: COLONOSCOPY WITH PROPOFOL: SHX5780

## 2016-09-10 HISTORY — DX: Other specified postprocedural states: Z98.890

## 2016-09-10 HISTORY — DX: Nausea with vomiting, unspecified: R11.2

## 2016-09-10 HISTORY — DX: Dyspnea, unspecified: R06.00

## 2016-09-10 LAB — GLUCOSE, CAPILLARY: GLUCOSE-CAPILLARY: 116 mg/dL — AB (ref 65–99)

## 2016-09-10 SURGERY — COLONOSCOPY WITH PROPOFOL
Anesthesia: Monitor Anesthesia Care

## 2016-09-10 MED ORDER — LIDOCAINE 2% (20 MG/ML) 5 ML SYRINGE
INTRAMUSCULAR | Status: AC
  Filled 2016-09-10: qty 5

## 2016-09-10 MED ORDER — PROPOFOL 10 MG/ML IV BOLUS
INTRAVENOUS | Status: DC | PRN
  Administered 2016-09-10 (×2): 20 mg via INTRAVENOUS

## 2016-09-10 MED ORDER — PROPOFOL 10 MG/ML IV BOLUS
INTRAVENOUS | Status: AC
  Filled 2016-09-10: qty 20

## 2016-09-10 MED ORDER — PROPOFOL 500 MG/50ML IV EMUL
INTRAVENOUS | Status: DC | PRN
  Administered 2016-09-10: 140 ug/kg/min via INTRAVENOUS

## 2016-09-10 MED ORDER — LACTATED RINGERS IV SOLN
INTRAVENOUS | Status: DC
Start: 2016-09-10 — End: 2016-09-10
  Administered 2016-09-10: 1000 mL via INTRAVENOUS

## 2016-09-10 MED ORDER — LIDOCAINE 2% (20 MG/ML) 5 ML SYRINGE
INTRAMUSCULAR | Status: DC | PRN
  Administered 2016-09-10: 100 mg via INTRAVENOUS

## 2016-09-10 MED ORDER — PROPOFOL 10 MG/ML IV BOLUS
INTRAVENOUS | Status: AC
  Filled 2016-09-10: qty 40

## 2016-09-10 MED ORDER — SODIUM CHLORIDE 0.9 % IV SOLN: INTRAVENOUS | Status: DC

## 2016-09-10 SURGICAL SUPPLY — 21 items

## 2016-09-10 NOTE — Discharge Instructions (Signed)
YOU HAD AN ENDOSCOPIC PROCEDURE TODAY: Refer to the procedure report and other information in the discharge instructions given to you for any specific questions about what was found during the examination. If this information does not answer your questions, please call Dr. Wynetta Emery office at [phone] to clarify.   YOU SHOULD EXPECT: Some feelings of bloating in the abdomen. Passage of more gas than usual. Walking can help get rid of the air that was put into your GI tract during the procedure and reduce the bloating. If you had a lower endoscopy (such as a colonoscopy or flexible sigmoidoscopy) you may notice spotting of blood in your stool or on the toilet paper. Some abdominal soreness may be present for a day or two, also.  DIET: Your first meal following the procedure should be a light meal and then it is ok to progress to your normal diet. A half-sandwich or bowl of soup is an example of a good first meal. Heavy or fried foods are harder to digest and may make you feel nauseous or bloated. Drink plenty of fluids but you should avoid alcoholic beverages for 24 hours. If you had a esophageal dilation, please see attached instructions for diet.   ACTIVITY: Your care partner should take you home directly after the procedure. You should plan to take it easy, moving slowly for the rest of the day. You can resume normal activity the day after the procedure however YOU SHOULD NOT DRIVE, use power tools, machinery or perform tasks that involve climbing or major physical exertion for 24 hours (because of the sedation medicines used during the test).   SYMPTOMS TO REPORT IMMEDIATELY: A gastroenterologist can be reached at any hour. Please call [phone] for any of the following symptoms:  Following lower endoscopy (colonoscopy, flexible sigmoidoscopy) Excessive amounts of blood in the stool  Significant tenderness, worsening of abdominal pains  Swelling of the abdomen that is new, acute  Fever of 100  or higher  Following upper endoscopy (EGD, EUS, ERCP, esophageal dilation) Vomiting of blood or coffee ground material  New, significant abdominal pain  New, significant chest pain or pain under the shoulder blades  Painful or persistently difficult swallowing  New shortness of breath  Black, tarry-looking or red, bloody stools  FOLLOW UP:  If any biopsies were taken you will be contacted by phone or by letter within the next 1-3 weeks. Call [phone]  if you have not heard about the biopsies in 3 weeks.  Please also call with any specific questions about appointments or follow up tests.

## 2016-09-10 NOTE — Anesthesia Preprocedure Evaluation (Addendum)
Anesthesia Evaluation  Patient identified by MRN, date of birth, ID band Patient awake    Reviewed: Allergy & Precautions, NPO status , Patient's Chart, lab work & pertinent test results  History of Anesthesia Complications Negative for: history of anesthetic complications  Airway Mallampati: II  TM Distance: >3 FB Neck ROM: Full    Dental  (+) Upper Dentures, Lower Dentures, Edentulous Upper, Edentulous Lower   Pulmonary shortness of breath, sleep apnea and Continuous Positive Airway Pressure Ventilation ,    breath sounds clear to auscultation       Cardiovascular hypertension, Pt. on medications and Pt. on home beta blockers (-) angina+ DVT   Rhythm:Regular Rate:Normal  6/17 ECHO: EF 55-60%, valves OK   Neuro/Psych  Headaches,    GI/Hepatic Neg liver ROS, GERD  Medicated and Controlled,Ulcerative colitis   Endo/Other  diabetes (glu 116), Oral Hypoglycemic AgentsMorbid obesity  Renal/GU negative Renal ROS     Musculoskeletal  (+) Arthritis , Osteoarthritis,    Abdominal (+) + obese,   Peds  Hematology   Anesthesia Other Findings   Reproductive/Obstetrics                            Anesthesia Physical Anesthesia Plan  ASA: III  Anesthesia Plan: MAC   Post-op Pain Management:    Induction:   Airway Management Planned: Natural Airway and Nasal Cannula  Additional Equipment:   Intra-op Plan:   Post-operative Plan:   Informed Consent: I have reviewed the patients History and Physical, chart, labs and discussed the procedure including the risks, benefits and alternatives for the proposed anesthesia with the patient or authorized representative who has indicated his/her understanding and acceptance.   Dental advisory given  Plan Discussed with: CRNA and Surgeon  Anesthesia Plan Comments: (Plan routine monitors, MAC)        Anesthesia Quick Evaluation

## 2016-09-10 NOTE — Op Note (Addendum)
[facility] Patient Name: Natasha Glass Procedure Date: 09/10/2016 MRN: [id number] Attending MD: Garlan Fair , MD Date of Birth: 1941/10/10 CSN: [id number] Age: 75 Admit Type: Outpatient Procedure:                Colonoscopy Indications:              High risk colon cancer surveillance: Ulcerative                            pancolitis of 8 (or more) years duration Providers:                Garlan Fair, MD, Hilma Favors, RN, [facility], Technician, Danley Danker, CRNA Referring MD:              Medicines:                Propofol per Anesthesia Complications:            No immediate complications. Estimated Blood Loss:     Estimated blood loss: none. Procedure:                Pre-Anesthesia Assessment:                           - Prior to the procedure, a History and Physical                            was performed, and patient medications and                            allergies were reviewed. The patient's tolerance of                            previous anesthesia was also reviewed. The risks                            and benefits of the procedure and the sedation                            options and risks were discussed with the patient.                            All questions were answered, and informed consent                            was obtained. Prior Anticoagulants: The patient has                            taken aspirin, last dose was 1 day prior to                            procedure. ASA Grade Assessment: III - A patient  with severe systemic disease. After reviewing the                            risks and benefits, the patient was deemed in                            satisfactory condition to undergo the procedure.                           After obtaining informed consent, the colonoscope                            was passed under direct vision. Throughout the                 procedure, the patient's blood pressure, pulse, and                            oxygen saturations were monitored continuously. The                            EC-[id number] ([id number]) scope was introduced through                            the anus and advanced to the the cecum, identified                            by appendiceal orifice and ileocecal valve. The                            colonoscopy was performed without difficulty. The                            patient tolerated the procedure well. The quality                            of the bowel preparation was good. The appendiceal                            orifice and the rectum were photographed. Scope In: 10:32:35 AM Scope Out: 11:01:38 AM Scope Withdrawal Time: 0 hours 21 minutes 44 seconds  Total Procedure Duration: 0 hours 29 minutes 3 seconds  Findings:      The perianal and digital rectal examinations were normal.      The entire examined colon appeared normal.      Four biopsies were taken every 10 cm with a cold forceps from the cecum,       ascending colon, right transverse colon, left transverse colon,       descending colon, sigmoid colon and rectum for ulcerative colitis       surveillance. Impression:               - The entire examined colon is normal.                           - Biopsies for surveillance were taken from the  cecum, ascending colon, right transverse colon,                            left transverse colon, descending colon, sigmoid                            colon and rectum. Moderate Sedation:      N/A- Per Anesthesia Care Recommendation:           - Patient has a contact number available for                            emergencies. The signs and symptoms of potential                            delayed complications were discussed with the                            patient. Return to normal activities tomorrow.                            Written discharge  instructions were provided to the                            patient.                           - Repeat colonoscopy date to be determined after                            pending pathology results are reviewed for                            surveillance.                           - Resume previous diet.                           - Continue present medications. Procedure Code(s):        --- Professional ---                           [phone], Colonoscopy, flexible; with biopsy, single                            or multiple Diagnosis Code(s):        --- Professional ---                           K51.00, Ulcerative (chronic) pancolitis without                            complications CPT copyright 2016 [employer]. All rights reserved. The codes documented in this report are preliminary and upon coder review may  be revised to meet current compliance requirements. Earle Gell, MD Garlan Fair, MD 09/10/2016 11:09:33  AM This report has been signed electronically. Number of Addenda: 0

## 2016-09-10 NOTE — H&P (Signed)
Procedure: Screening colonoscopy. Universal ulcerative colitis diagnosed at age 75. 05/27/2013 normal screening colonoscopy was performed. Surveillance mucosal biopsies showed no inflammation or dysplasia.  History: The patient is a 75 year old female born [birth year]. She was diagnosed with Universal ulcerative colitis at age 66. She is scheduled to undergo a repeat screening colonoscopy today. She has obstructive sleep apnea syndrome.  Past medical history: Universal ulcerative colitis diagnosed at age 60. Obstructive sleep apnea. Type 2 diabetes mellitus complicated by diabetic retinopathy. Hypercholesterolemia. Hypertension.  Neuropathy. Macular degeneration. Ocular migraine syndrome. Osteoarthritis. Ascending aortic aneurysm 4.9 cm in diameter. Hysterectomy. Left knee replacement surgery performed in 2005. Right knee replacement surgery performed in 2012. Left rotator cuff surgery.  Family history: Mother was diagnosed with colon cancer  Allergies: Morphine. Diovan. Macrodantin. Tetanus toxoid. Tramadol.  Exam: The patient is alert and lying comfortably on the endoscopy stretcher. Abdomen is soft and nontender to palpation. Lungs are clear to auscultation. Cardiac exam reveals a regular rhythm  Plan: Proceed with screening colonoscopy

## 2016-09-10 NOTE — Anesthesia Postprocedure Evaluation (Signed)
Anesthesia Post Note  Patient: Natasha Glass  Procedure(s) Performed: Procedure(s) (LRB): COLONOSCOPY WITH PROPOFOL (N/A)  Patient location during evaluation: Endoscopy Anesthesia Type: MAC Level of consciousness: awake and alert, oriented and patient cooperative Pain management: pain level controlled Vital Signs Assessment: post-procedure vital signs reviewed and stable Respiratory status: spontaneous breathing, nonlabored ventilation and respiratory function stable Cardiovascular status: blood pressure returned to baseline and stable Postop Assessment: no signs of nausea or vomiting Anesthetic complications: no    Last Vitals:  Vitals:   09/10/16 1110 09/10/16 1130  BP:    Pulse: 70 68  Resp:  17  Temp:      Last Pain:  Vitals:   09/10/16 1109  TempSrc: Oral                 Benoit Meech,E. [name]

## 2016-09-10 NOTE — Transfer of Care (Signed)
Immediate Anesthesia Transfer of Care Note  Patient: Natasha Glass  Procedure(s) Performed: Procedure(s): COLONOSCOPY WITH PROPOFOL (N/A)  Patient Location: Endoscopy Unit  Anesthesia Type:MAC  Level of Consciousness: awake, alert  and oriented  Airway & Oxygen Therapy: Patient Spontanous Breathing and Patient connected to face mask oxygen  Post-op Assessment: Report given to RN and Post -op Vital signs reviewed and stable  Post vital signs: Reviewed and stable  Last Vitals:  Vitals:   09/10/16 0921 09/10/16 0931  BP:  (!) 156/72  Pulse:  66  Resp:  19  Temp: (P) 36.5 C 36.5 C    Last Pain:  Vitals:   09/10/16 0931  TempSrc: Oral         Complications: No apparent anesthesia complications

## 2016-09-11 ENCOUNTER — Encounter (HOSPITAL_COMMUNITY): Payer: Self-pay | Admitting: Gastroenterology

## 2016-10-24 ENCOUNTER — Encounter (INDEPENDENT_AMBULATORY_CARE_PROVIDER_SITE_OTHER): Payer: Medicare Other | Admitting: Ophthalmology

## 2016-10-24 DIAGNOSIS — I10 Essential (primary) hypertension: Secondary | ICD-10-CM

## 2016-10-24 DIAGNOSIS — H353132 Nonexudative age-related macular degeneration, bilateral, intermediate dry stage: Secondary | ICD-10-CM | POA: Diagnosis not present

## 2016-10-24 DIAGNOSIS — H35033 Hypertensive retinopathy, bilateral: Secondary | ICD-10-CM

## 2016-10-24 DIAGNOSIS — H3561 Retinal hemorrhage, right eye: Secondary | ICD-10-CM | POA: Diagnosis not present

## 2016-10-24 DIAGNOSIS — H43813 Vitreous degeneration, bilateral: Secondary | ICD-10-CM

## 2017-01-09 ENCOUNTER — Encounter (HOSPITAL_BASED_OUTPATIENT_CLINIC_OR_DEPARTMENT_OTHER): Payer: Medicare Other | Attending: Internal Medicine

## 2017-01-09 DIAGNOSIS — Z86718 Personal history of other venous thrombosis and embolism: Secondary | ICD-10-CM | POA: Diagnosis not present

## 2017-01-09 DIAGNOSIS — L97821 Non-pressure chronic ulcer of other part of left lower leg limited to breakdown of skin: Secondary | ICD-10-CM | POA: Insufficient documentation

## 2017-01-09 DIAGNOSIS — I10 Essential (primary) hypertension: Secondary | ICD-10-CM | POA: Insufficient documentation

## 2017-01-09 DIAGNOSIS — E11622 Type 2 diabetes mellitus with other skin ulcer: Secondary | ICD-10-CM | POA: Insufficient documentation

## 2017-01-09 DIAGNOSIS — G4730 Sleep apnea, unspecified: Secondary | ICD-10-CM | POA: Insufficient documentation

## 2017-01-09 DIAGNOSIS — E1140 Type 2 diabetes mellitus with diabetic neuropathy, unspecified: Secondary | ICD-10-CM | POA: Diagnosis not present

## 2017-03-04 ENCOUNTER — Other Ambulatory Visit: Payer: Self-pay | Admitting: Internal Medicine

## 2017-03-04 DIAGNOSIS — R7989 Other specified abnormal findings of blood chemistry: Secondary | ICD-10-CM

## 2017-03-04 DIAGNOSIS — Z86718 Personal history of other venous thrombosis and embolism: Secondary | ICD-10-CM

## 2017-03-06 ENCOUNTER — Ambulatory Visit
Admission: RE | Admit: 2017-03-06 | Discharge: 2017-03-06 | Disposition: A | Discharge status: Home / Self Care | Payer: Medicare Other | Source: Ambulatory Visit | Attending: Internal Medicine | Admitting: Internal Medicine

## 2017-03-06 DIAGNOSIS — R7989 Other specified abnormal findings of blood chemistry: Secondary | ICD-10-CM

## 2017-03-06 DIAGNOSIS — Z86718 Personal history of other venous thrombosis and embolism: Secondary | ICD-10-CM

## 2017-04-10 ENCOUNTER — Other Ambulatory Visit: Payer: Self-pay | Admitting: Internal Medicine

## 2017-04-10 ENCOUNTER — Ambulatory Visit
Admission: RE | Admit: 2017-04-10 | Discharge: 2017-04-10 | Disposition: A | Discharge status: Home / Self Care | Payer: Medicare Other | Source: Ambulatory Visit | Attending: Internal Medicine | Admitting: Internal Medicine

## 2017-04-10 DIAGNOSIS — R091 Pleurisy: Secondary | ICD-10-CM

## 2017-04-30 ENCOUNTER — Other Ambulatory Visit: Payer: Self-pay | Admitting: Surgery

## 2017-04-30 DIAGNOSIS — I712 Thoracic aortic aneurysm, without rupture: Secondary | ICD-10-CM

## 2017-04-30 DIAGNOSIS — I7121 Aneurysm of the ascending aorta, without rupture: Secondary | ICD-10-CM

## 2017-05-28 ENCOUNTER — Other Ambulatory Visit (HOSPITAL_COMMUNITY): Payer: Self-pay | Admitting: Internal Medicine

## 2017-05-28 DIAGNOSIS — R609 Edema, unspecified: Secondary | ICD-10-CM

## 2017-06-02 ENCOUNTER — Ambulatory Visit (HOSPITAL_COMMUNITY)
Admission: RE | Admit: 2017-06-02 | Discharge: 2017-06-02 | Disposition: A | Discharge status: Home / Self Care | Payer: Medicare Other | Source: Ambulatory Visit | Attending: Internal Medicine | Admitting: Internal Medicine

## 2017-06-02 DIAGNOSIS — R600 Localized edema: Secondary | ICD-10-CM | POA: Insufficient documentation

## 2017-06-02 DIAGNOSIS — E119 Type 2 diabetes mellitus without complications: Secondary | ICD-10-CM | POA: Insufficient documentation

## 2017-06-02 DIAGNOSIS — R0600 Dyspnea, unspecified: Secondary | ICD-10-CM | POA: Diagnosis not present

## 2017-06-02 DIAGNOSIS — R0601 Orthopnea: Secondary | ICD-10-CM | POA: Insufficient documentation

## 2017-06-02 DIAGNOSIS — R609 Edema, unspecified: Secondary | ICD-10-CM | POA: Diagnosis not present

## 2017-06-02 NOTE — Progress Notes (Signed)
  Echocardiogram 2D Echocardiogram has been performed.  Pressley Tadesse T Mellony Danziger 06/02/2017, 11:36 AM

## 2017-06-04 ENCOUNTER — Ambulatory Visit
Admission: RE | Admit: 2017-06-04 | Discharge: 2017-06-04 | Disposition: A | Discharge status: Home / Self Care | Payer: Medicare Other | Source: Ambulatory Visit | Attending: Surgery | Admitting: Surgery

## 2017-06-04 ENCOUNTER — Ambulatory Visit: Payer: Medicare Other | Admitting: Surgery

## 2017-06-04 DIAGNOSIS — I712 Thoracic aortic aneurysm, without rupture: Secondary | ICD-10-CM

## 2017-06-04 DIAGNOSIS — I7121 Aneurysm of the ascending aorta, without rupture: Secondary | ICD-10-CM

## 2017-06-04 MED ORDER — IOPAMIDOL (ISOVUE-370) INJECTION 76%
75.0000 mL | Freq: Once | INTRAVENOUS | Status: AC | PRN
  Administered 2017-06-04: 75 mL via INTRAVENOUS

## 2017-06-18 ENCOUNTER — Encounter: Payer: Self-pay | Admitting: Surgery

## 2017-06-18 ENCOUNTER — Ambulatory Visit (INDEPENDENT_AMBULATORY_CARE_PROVIDER_SITE_OTHER): Payer: Medicare Other | Admitting: Surgery

## 2017-06-18 VITALS — BP 155/78 | HR 63 | Resp 18 | Ht 66.0 in | Wt 314.0 lb

## 2017-06-18 DIAGNOSIS — I712 Thoracic aortic aneurysm, without rupture: Secondary | ICD-10-CM

## 2017-06-18 DIAGNOSIS — I7121 Aneurysm of the ascending aorta, without rupture: Secondary | ICD-10-CM

## 2017-06-18 NOTE — Progress Notes (Signed)
HPI:  She returns for follow up of her ascending aortic aneurysm. An echo in 01/2009 had shown an aortic root diameter of 3.7 cm. CTA of the chest then showed that the ascending aorta was 4.4 cm. CTA of the chest in 2015 showed that it measured 5 cm but there was some artifact due to motion. The CT two years ago showed it to measure 4.5 cm. CTA one year ago showed no change at 4.5 cm. She has been been stable of the past year. 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 She says that she recently underwent a cardiac workup because of lower extremity edema which has been a chronic problem for her.  Her echo showed an EF of 55-60% with no valvular abnormality.  Current Outpatient Prescriptions  Medication Sig Dispense Refill  . allopurinol (ZYLOPRIM) 300 MG tablet Take 300 mg by mouth every evening.     Marland Kitchen aspirin 81 MG tablet Take 81 mg by mouth at bedtime.     . Cholecalciferol (VITAMIN D) 2000 UNITS tablet Take 2,000 Units by mouth every morning.     . furosemide (LASIX) 40 MG tablet Take 40 mg by mouth daily as needed for fluid.     Marland Kitchen ibuprofen (ADVIL,MOTRIN) 200 MG tablet Take 200 mg by mouth every 6 (six) hours as needed.    Marland Kitchen losartan (COZAAR) 100 MG tablet Take 100 mg by mouth daily.    . Magnesium 250 MG TABS Take 250 mg by mouth daily.     . metFORMIN (GLUCOPHAGE) 500 MG tablet Take 500 mg by mouth 2 (two) times daily with a meal.     . metoprolol tartrate (LOPRESSOR) 25 MG tablet Take 25 mg by mouth 2 (two) times daily.    . Multiple Vitamins-Minerals (PRESERVISION AREDS 2) CAPS Take 2 capsules by mouth daily.    . Omega-3 Fatty Acids (FISH OIL) 1000 MG CAPS Take 2,000 mg by mouth daily.     Marland Kitchen omeprazole (PRILOSEC) 20 MG capsule Take 1 capsule (20 mg total) by mouth daily. (Patient taking differently: Take 20 mg by mouth daily as needed. ) 20 capsule 0  . Phenylephrine-APAP-Guaifenesin (TYLENOL SINUS  SEVERE) 5-325-200 MG TABS Take 1 tablet by mouth 2 (two) times daily as needed.    . sulfaSALAzine (AZULFIDINE) 500 MG tablet Take 500 mg by mouth 4 (four) times daily.    Marland Kitchen tetrahydrozoline 0.05 % ophthalmic solution Place 1 drop into both eyes daily as needed (for dry eyes).     No current facility-administered medications for this visit.      Physical Exam: BP (!) 155/78 (BP Location: Left Arm, Patient Position: Sitting, Cuff Size: Large)   Pulse 63   Resp 18   Ht 5' 6"  (1.676 m)   Wt (!) 314 lb (142.4 kg)   SpO2 94% Comment: ON RA  BMI 50.68 kg/m  She is a morbidly obese woman in no distress Cardiac exam shows a regular rate and rhythm with normal heart sounds. There is no murmur. Lung exam is clear  Diagnostic Tests:  CLINICAL DATA:  Previously noted ascending thoracic aortic dilatation.  EXAM: CT ANGIOGRAPHY CHEST WITH CONTRAST  TECHNIQUE: Multidetector CT imaging of the chest was performed using the standard protocol during bolus administration of intravenous contrast. Multiplanar CT image reconstructions and MIPs were obtained to evaluate the vascular anatomy.  CONTRAST:  75 mL Isovue 370 nonionic  COMPARISON:  Chest CT April 11, 2016; chest radiograph March 31, 2017  FINDINGS: Cardiovascular: Ascending thoracic aorta measures 4.4 x 4.3 cm. There is no appreciable thoracic aortic dissection. Visualized great vessels appear unremarkable. There are scattered foci of coronary artery calcification. There is no appreciable thoracic aortic calcification. Pericardium is not appreciably thickened. There is no evident pulmonary embolus.  Mediastinum/Nodes: Thyroid appears unremarkable. There is no appreciable thoracic adenopathy. Esophagus appears unremarkable.  Lungs/Pleura: There is a small calcified granuloma in the posterior segment of the left upper lobe. There is slight bibasilar atelectasis. There is no appreciable edema or consolidation. No pleural  effusion or pleural thickening evident.  Upper Abdomen: Visualized upper abdominal structures appear unremarkable.  Musculoskeletal: There are foci of degenerative change in the thoracic spine. There are no blastic or lytic bone lesions.  Review of the MIP images confirms the above findings.  IMPRESSION: 1. Ascending thoracic aorta measures 4.4 x 4.3 cm. There has been no enlargement of the ascending thoracic aorta compared to prior study. Recommend annual imaging followup by CTA or MRA. This recommendation follows 2010 ACCF/AHA/AATS/ACR/ASA/SCA/SCAI/SIR/STS/SVM Guidelines for the Diagnosis and Management of Patients with Thoracic Aortic Disease. Circulation. 2010; 121: L244-W102. No appreciable thoracic aortic dissection.  2.  There are scattered foci of coronary artery calcification.  3. Small calcified granuloma left upper lobe. No edema or consolidation.  4.  No appreciable thoracic adenopathy.  Aortic aneurysm NOS (ICD10-I71.9).   Electronically Signed   By: Lowella Grip III M.D.   On: 06/04/2017 11:33  Impression:  She has a stable 4.4 cm fusiform ascending aortic aneurysm. This has increased in size since 2010 when it was 3.7 cm but has been stable for several years and is below the threshold for surgical treatment which is 5.5 cm. I reviewed the CT images with her and answered her questions. Her BP is elevated today but she says that it is usually 130 or less. I stressed the importance of good BP control and getting some regular exercise.  Plan:  I will see her back in one year with a CTA of the chest.  I spent 15 minutes performing this established patient evaluation and > 50% of this time was spent face to face counseling and coordinating the care of this patient's aortic aneurysm.   Gaye Pollack, MD [facility] [phone]

## 2017-10-16 ENCOUNTER — Other Ambulatory Visit: Payer: Self-pay | Admitting: Internal Medicine

## 2017-10-16 ENCOUNTER — Ambulatory Visit
Admission: RE | Admit: 2017-10-16 | Discharge: 2017-10-16 | Disposition: A | Discharge status: Home / Self Care | Payer: Medicare Other | Source: Ambulatory Visit | Attending: Internal Medicine | Admitting: Internal Medicine

## 2017-10-16 DIAGNOSIS — M5489 Other dorsalgia: Secondary | ICD-10-CM

## 2017-10-27 ENCOUNTER — Ambulatory Visit (INDEPENDENT_AMBULATORY_CARE_PROVIDER_SITE_OTHER): Payer: Medicare Other | Admitting: Ophthalmology

## 2017-11-21 ENCOUNTER — Encounter (INDEPENDENT_AMBULATORY_CARE_PROVIDER_SITE_OTHER): Payer: Medicare Other | Admitting: Ophthalmology

## 2017-11-21 DIAGNOSIS — H35033 Hypertensive retinopathy, bilateral: Secondary | ICD-10-CM

## 2017-11-21 DIAGNOSIS — I10 Essential (primary) hypertension: Secondary | ICD-10-CM

## 2017-11-21 DIAGNOSIS — H2513 Age-related nuclear cataract, bilateral: Secondary | ICD-10-CM

## 2017-11-21 DIAGNOSIS — H43813 Vitreous degeneration, bilateral: Secondary | ICD-10-CM | POA: Diagnosis not present

## 2017-11-21 DIAGNOSIS — H353132 Nonexudative age-related macular degeneration, bilateral, intermediate dry stage: Secondary | ICD-10-CM

## 2018-01-19 ENCOUNTER — Ambulatory Visit (INDEPENDENT_AMBULATORY_CARE_PROVIDER_SITE_OTHER): Payer: Medicare Other | Admitting: Orthopedic Surgery

## 2018-01-19 ENCOUNTER — Ambulatory Visit (INDEPENDENT_AMBULATORY_CARE_PROVIDER_SITE_OTHER): Payer: Medicare Other

## 2018-01-19 ENCOUNTER — Encounter (INDEPENDENT_AMBULATORY_CARE_PROVIDER_SITE_OTHER): Payer: Self-pay | Admitting: Orthopaedic Surgery

## 2018-01-19 DIAGNOSIS — M79672 Pain in left foot: Secondary | ICD-10-CM

## 2018-01-19 DIAGNOSIS — M79671 Pain in right foot: Secondary | ICD-10-CM | POA: Diagnosis not present

## 2018-01-19 DIAGNOSIS — I87333 Chronic venous hypertension (idiopathic) with ulcer and inflammation of bilateral lower extremity: Secondary | ICD-10-CM | POA: Diagnosis not present

## 2018-01-19 DIAGNOSIS — L97929 Non-pressure chronic ulcer of unspecified part of left lower leg with unspecified severity: Secondary | ICD-10-CM

## 2018-01-19 DIAGNOSIS — L97919 Non-pressure chronic ulcer of unspecified part of right lower leg with unspecified severity: Secondary | ICD-10-CM

## 2018-01-19 NOTE — Addendum Note (Signed)
Addended by: Pamella Pert on: 01/19/2018 04:15 PM   Modules accepted: Orders

## 2018-01-19 NOTE — Progress Notes (Signed)
Office Visit Note   Patient: Natasha Glass           Date of Birth: 1941-07-26           MRN: [id number] Visit Date: 01/19/2018              Requested by: Josetta Huddle, MD [address] [place], [place] [address] PCP: Josetta Huddle, MD  Chief Complaint  Patient presents with  . Right Foot - Pain  . Left Foot - Pain      HPI: Patient is a 77 year old woman who was seen for initial evaluation for bilateral foot pain and chronic venous insufficiency both legs.  Patient states she is been treated with topical steroids for venous ulcers.  She takes Lasix twice a day she has diabetes currently poorly controlled.  Patient states she has had 1 brother die from diabetes and the other brother also has diabetes.  Assessment & Plan: Visit Diagnoses:  1. Pain in left foot   2. Pain of right heel   3. 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  Patient has had trouble with medical stockings in the past and I feel that we should be able  to get her into the proper treatment plan.  We will also check her uric acid.  Follow-Up Instructions: Return in about 1 week (around 01/26/2018), or if symptoms worsen or fail to improve.   Ortho Exam  Patient is alert, oriented, no adenopathy, well-dressed, normal affect, normal respiratory effort. Examination patient has massive venous stasis swelling of both legs with dermatitis.  Patient has a palpable dorsalis pedis pulse bilaterally with no arterial insufficiency.  Her feet are plantigrade she has callus with very thin atrophic fat pad over the heel pad worse on the left than the right.  She has onychomycotic nails with no signs of infection.  She has pitting edema up to the tibial tubercle with brawny skin color changes.  Her  left calf is 58 cm in circumference right calf is 56 cm in circumference.  Discussed that we would need to wrap both legs to get the swelling down so we could get her into a  Medical compression stockings.  Imaging: Xr Foot Complete Left  Result Date: 01/19/2018 3 view radiographs of the left foot shows severe hallux valgus deformity there is less space planus deformity but there is arthritic changes through the midfoot.  There is lytic changes at the great toe MTP joint.  Lytic changes appear most consistent with gout.  Xr Foot Complete Right  Result Date: 01/19/2018 3 view radiographs of the right foot shows severe hallux valgus deformity no evidence of fractures there is osteoporosis.  Radiographs shows paced planus with arthritic changes through the midfoot.  No images are attached to the encounter.  Labs: Lab Results  Component Value Date   HGBA1C 6.4 (H) 04/10/2016   HGBA1C (H) 02/07/2009    6.9 (NOTE) The ADA recommends the following therapeutic goal for glycemic control related to Hgb A1c measurement: Goal of therapy: <6.5 Hgb A1c  Reference: American Diabetes Association: Clinical Practice Recommendations 2010, Diabetes Care, 2010, 33: (Suppl  1).   ESRSEDRATE 14 02/07/2009   REPTSTATUS 04/13/2016 FINAL 04/10/2016   CULT >=100,000 COLONIES/mL KLEBSIELLA PNEUMONIAE (A) 04/10/2016  LABORGA KLEBSIELLA PNEUMONIAE (A) 04/10/2016    @LABSALLVALUES (HGBA1)@  There is no height or weight on file to calculate BMI.  Orders:  Orders Placed This Encounter  Procedures  . XR Foot Complete Left  . XR Foot Complete Right   No orders of the defined types were placed in this encounter.    Procedures: No procedures performed  Clinical Data: No additional findings.  ROS:  All other systems negative, except as noted in the HPI. Review of Systems  Objective: Vital Signs: There were no vitals taken for this visit.  Specialty Comments:  No specialty comments available.  PMFS  History: Patient Active Problem List   Diagnosis Date Noted  . 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 Acute respiratory failure with hypoxia ([place]) 04/11/2016  . UTI (lower urinary tract infection) 04/10/2016  . Generalized weakness 04/10/2016  . Nausea 04/10/2016  . Diabetes ([place]) 04/10/2016  . Diabetes mellitus with complication ([place])   . Ascending aortic aneurysm ([place]) 05/18/2014  . Ulcerative colitis ([place]) 05/07/2012  . Obesity 05/07/2012  . Hypertension 05/07/2012  . History of pulmonary embolism 05/07/2012   Past Medical History:  Diagnosis Date  . Anemia   . Arthritis    "legs, hands, knees, back" (04/10/2016)  . DVT (deep venous thrombosis) ([place]) 01/2009  . Dyspnea    due to weight   . GERD (gastroesophageal reflux disease)    occ.  . Gout   . Headache    "at least weekly" (04/10/2016)  . History of blood transfusion 1990s   "related to ulcerative colitis"  . History of colonic polyps 09/2010  . Hyperlipidemia   . Hypertension   . Lactose intolerance   . Macular degeneration, dry    bilateral  . Migraine    "q 2-3 months" (04/10/2016)  . Obesity   . Osteopenia   . Peripheral neuropathy   . Pneumonia 1970s  . PONV (postoperative nausea and vomiting)    hx of years ago none recent   . Pulmonary embolism ([place]) 01/2009  . Shortness of breath on exertion   . Sleep apnea    cpap pressure 4.0-16.0   . Type II diabetes mellitus ([place])    type II   . 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 Urinary incontinence    wears peripad    Family History  Problem Relation Age of Onset  . Colon cancer Mother   . Lung cancer Mother   . Heart disease Father     Past Surgical History:  Procedure Laterality Date  . ABDOMINAL EXPLORATION SURGERY  1960  . ABDOMINAL HYSTERECTOMY  1986  . CARDIAC CATHETERIZATION    . COLONOSCOPY W/ BIOPSIES AND POLYPECTOMY  2005; 2011; 2012  . COLONOSCOPY WITH  PROPOFOL N/A 09/10/2016   Procedure: COLONOSCOPY WITH PROPOFOL;  Surgeon: Garlan Fair, MD;  Location: [facility];  Service: Endoscopy;  Laterality: N/A;  . JOINT REPLACEMENT    . KNEE ARTHROSCOPY Left ~ 1999  . SHOULDER OPEN ROTATOR CUFF REPAIR Left 2011  . TOTAL KNEE ARTHROPLASTY Bilateral 2005-2012   left-right   Social History   Occupational History  . Not on file  Tobacco Use  . Smoking status: Never Smoker  . Smokeless tobacco: Never Used  Substance and Sexual Activity  . Alcohol use: No  . Drug use: No  . Sexual activity: Never

## 2018-01-20 LAB — URIC ACID: URIC ACID, SERUM: 6.2 mg/dL (ref 2.5–7.0)

## 2018-01-22 ENCOUNTER — Ambulatory Visit (INDEPENDENT_AMBULATORY_CARE_PROVIDER_SITE_OTHER): Payer: Medicare Other | Admitting: Orthopedic Surgery

## 2018-01-22 ENCOUNTER — Encounter (INDEPENDENT_AMBULATORY_CARE_PROVIDER_SITE_OTHER): Payer: Self-pay | Admitting: Orthopedic Surgery

## 2018-01-22 VITALS — Ht 66.0 in | Wt 314.0 lb

## 2018-01-22 DIAGNOSIS — L97919 Non-pressure chronic ulcer of unspecified part of right lower leg with unspecified severity: Secondary | ICD-10-CM

## 2018-01-22 DIAGNOSIS — I87333 Chronic venous hypertension (idiopathic) with ulcer and inflammation of bilateral lower extremity: Secondary | ICD-10-CM

## 2018-01-22 DIAGNOSIS — L97929 Non-pressure chronic ulcer of unspecified part of left lower leg with unspecified severity: Secondary | ICD-10-CM

## 2018-01-22 NOTE — Progress Notes (Signed)
Office Visit Note   Patient: Natasha Glass           Date of Birth: 07-09-1941           MRN: [id number] Visit Date: 01/22/2018              Requested by: Josetta Huddle, MD [address] [place], [place] [address] PCP: Josetta Huddle, MD  Chief Complaint  Patient presents with  . Right Leg - Follow-up    BLE dynalfex dressings applied 4 days ago  . Left Leg - Follow-up      HPI: Patient is a 77 year old woman with venous insufficiency who had massive swelling in both lower extremities she was wrapped with a Dynaflex wrap patient states she almost fell walking out of the office stating that she caught her foot on the ground.  Patient states that she was very tearful and upset about wearing the compression and presents at this time for follow-up.  Assessment & Plan: Visit Diagnoses:  1. 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  Patient is given a prescription to go to [facility] to obtain a pair of double extra-large knee-high compression stockings.  She will need to fold these over the top 4 fingerbreadths to prevent these from bunching up and sliding down.  Discussed the importance of not allowing any wrinkles of the stocking she is to wear these daily as long as she can tolerate.  If she has any problems she will follow-up immediately otherwise follow-up in 2 weeks.  Follow-Up Instructions: Return in about 2 weeks (around 02/05/2018).   Ortho Exam  Patient is alert, oriented, no adenopathy, well-dressed, normal affect, normal respiratory effort. Examination patient does have an unsteady gait the dermatitis shows no cellulitis in either leg there is no drainage no open wounds.  Patient does have excellent wrinkling of the skin at this time with a significant decreased  swelling around the foot and ankle.  The top of her calf  measures 54 cm in circumference.  Imaging: No results found. No images are attached to the encounter.  Labs: Lab Results  Component Value Date   HGBA1C 6.4 (H) 04/10/2016   HGBA1C (H) 02/07/2009    6.9 (NOTE) The ADA recommends the following therapeutic goal for glycemic control related to Hgb A1c measurement: Goal of therapy: <6.5 Hgb A1c  Reference: American Diabetes Association: Clinical Practice Recommendations 2010, Diabetes Care, 2010, 33: (Suppl  1).   ESRSEDRATE 14 02/07/2009   LABURIC 6.2 01/19/2018   REPTSTATUS 04/13/2016 FINAL 04/10/2016   CULT >=100,000 COLONIES/mL KLEBSIELLA PNEUMONIAE (A) 04/10/2016   LABORGA KLEBSIELLA PNEUMONIAE (A) 04/10/2016    @LABSALLVALUES (HGBA1)@  Body mass index is 50.68 kg/m.  Orders:  No orders of the defined types were placed in this encounter.  No orders of the defined types were placed in this encounter.    Procedures: No procedures performed  Clinical Data: No additional findings.  ROS:  All other systems negative, except as noted in the HPI. Review of Systems  Objective: Vital Signs: Ht 5' 6"  (1.676 m)   Wt (!) 314 lb (142.4 kg)   BMI 50.68 kg/m   Specialty Comments:  No specialty comments available.  PMFS History: Patient Active Problem List   Diagnosis Date Noted  . Idiopathic chronic venous hypertension of both  lower extremities with ulcer and inflammation ([place]) 01/19/2018  . Acute respiratory failure with hypoxia ([place]) 04/11/2016  . UTI (lower urinary tract infection) 04/10/2016  . Generalized weakness 04/10/2016  . Nausea 04/10/2016  . Diabetes ([place]) 04/10/2016  . Diabetes mellitus with complication ([place])   . Ascending aortic aneurysm ([place]) 05/18/2014  . Ulcerative colitis ([place]) 05/07/2012  . Obesity 05/07/2012  . Hypertension 05/07/2012  . History of pulmonary embolism 05/07/2012   Past Medical History:  Diagnosis Date  . Anemia   .  Arthritis    "legs, hands, knees, back" (04/10/2016)  . DVT (deep venous thrombosis) ([place]) 01/2009  . Dyspnea    due to weight   . GERD (gastroesophageal reflux disease)    occ.  . Gout   . Headache    "at least weekly" (04/10/2016)  . History of blood transfusion 1990s   "related to ulcerative colitis"  . History of colonic polyps 09/2010  . Hyperlipidemia   . Hypertension   . Lactose intolerance   . Macular degeneration, dry    bilateral  . Migraine    "q 2-3 months" (04/10/2016)  . Obesity   . Osteopenia   . Peripheral neuropathy   . Pneumonia 1970s  . PONV (postoperative nausea and vomiting)    hx of years ago none recent   . Pulmonary embolism ([place]) 01/2009  . Shortness of breath on exertion   . Sleep apnea    cpap pressure 4.0-16.0   . Type II diabetes mellitus ([place])    type II   . 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 Urinary incontinence    wears peripad    Family History  Problem Relation Age of Onset  . Colon cancer Mother   . Lung cancer Mother   . Heart disease Father     Past Surgical History:  Procedure Laterality Date  . ABDOMINAL EXPLORATION SURGERY  1960  . ABDOMINAL HYSTERECTOMY  1986  . CARDIAC CATHETERIZATION    . COLONOSCOPY W/ BIOPSIES AND POLYPECTOMY  2005; 2011; 2012  . COLONOSCOPY WITH PROPOFOL N/A 09/10/2016   Procedure: COLONOSCOPY WITH PROPOFOL;  Surgeon: Garlan Fair, MD;  Location: [facility];  Service: Endoscopy;  Laterality: N/A;  . JOINT REPLACEMENT    . KNEE ARTHROSCOPY Left ~ 1999  . SHOULDER OPEN ROTATOR CUFF REPAIR Left 2011  . TOTAL KNEE ARTHROPLASTY Bilateral 2005-2012   left-right   Social History   Occupational History  . Not on file  Tobacco Use  . Smoking status: Never Smoker  . Smokeless tobacco: Never Used  Substance and Sexual Activity  . Alcohol use: No  . Drug use: No  . Sexual activity: Never

## 2018-01-29 ENCOUNTER — Ambulatory Visit (INDEPENDENT_AMBULATORY_CARE_PROVIDER_SITE_OTHER): Payer: Medicare Other | Admitting: Orthopedic Surgery

## 2018-03-27 ENCOUNTER — Other Ambulatory Visit: Payer: Self-pay | Admitting: Internal Medicine

## 2018-03-27 DIAGNOSIS — Z1231 Encounter for screening mammogram for malignant neoplasm of breast: Secondary | ICD-10-CM

## 2018-04-07 ENCOUNTER — Encounter (HOSPITAL_COMMUNITY): Payer: Self-pay | Admitting: Nurse Practitioner

## 2018-04-07 ENCOUNTER — Ambulatory Visit (HOSPITAL_COMMUNITY)
Admission: RE | Admit: 2018-04-07 | Discharge: 2018-04-07 | Disposition: A | Discharge status: Home / Self Care | Payer: Medicare Other | Source: Ambulatory Visit | Attending: Nurse Practitioner | Admitting: Nurse Practitioner

## 2018-04-07 VITALS — BP 100/58 | HR 103 | Ht 66.0 in | Wt 278.0 lb

## 2018-04-07 DIAGNOSIS — Z90710 Acquired absence of both cervix and uterus: Secondary | ICD-10-CM | POA: Insufficient documentation

## 2018-04-07 DIAGNOSIS — Z9889 Other specified postprocedural states: Secondary | ICD-10-CM | POA: Insufficient documentation

## 2018-04-07 DIAGNOSIS — K219 Gastro-esophageal reflux disease without esophagitis: Secondary | ICD-10-CM | POA: Diagnosis not present

## 2018-04-07 DIAGNOSIS — G4733 Obstructive sleep apnea (adult) (pediatric): Secondary | ICD-10-CM | POA: Insufficient documentation

## 2018-04-07 DIAGNOSIS — I4891 Unspecified atrial fibrillation: Secondary | ICD-10-CM | POA: Insufficient documentation

## 2018-04-07 DIAGNOSIS — Z885 Allergy status to narcotic agent status: Secondary | ICD-10-CM | POA: Insufficient documentation

## 2018-04-07 DIAGNOSIS — E1140 Type 2 diabetes mellitus with diabetic neuropathy, unspecified: Secondary | ICD-10-CM | POA: Insufficient documentation

## 2018-04-07 DIAGNOSIS — I82409 Acute embolism and thrombosis of unspecified deep veins of unspecified lower extremity: Secondary | ICD-10-CM | POA: Diagnosis not present

## 2018-04-07 DIAGNOSIS — Z7984 Long term (current) use of oral hypoglycemic drugs: Secondary | ICD-10-CM | POA: Diagnosis not present

## 2018-04-07 DIAGNOSIS — E669 Obesity, unspecified: Secondary | ICD-10-CM | POA: Diagnosis not present

## 2018-04-07 DIAGNOSIS — Z888 Allergy status to other drugs, medicaments and biological substances status: Secondary | ICD-10-CM | POA: Diagnosis not present

## 2018-04-07 DIAGNOSIS — Z801 Family history of malignant neoplasm of trachea, bronchus and lung: Secondary | ICD-10-CM | POA: Diagnosis not present

## 2018-04-07 DIAGNOSIS — Z79899 Other long term (current) drug therapy: Secondary | ICD-10-CM | POA: Insufficient documentation

## 2018-04-07 DIAGNOSIS — I10 Essential (primary) hypertension: Secondary | ICD-10-CM | POA: Insufficient documentation

## 2018-04-07 DIAGNOSIS — Z7901 Long term (current) use of anticoagulants: Secondary | ICD-10-CM | POA: Insufficient documentation

## 2018-04-07 DIAGNOSIS — E785 Hyperlipidemia, unspecified: Secondary | ICD-10-CM | POA: Diagnosis not present

## 2018-04-07 DIAGNOSIS — Z6841 Body Mass Index (BMI) 40.0 and over, adult: Secondary | ICD-10-CM | POA: Diagnosis not present

## 2018-04-07 MED ORDER — RIVAROXABAN 20 MG PO TABS: 20.0000 mg | ORAL_TABLET | Freq: Every day | ORAL | 0 refills | Status: DC

## 2018-04-07 NOTE — Progress Notes (Signed)
Primary Care Physician: Josetta Huddle, MD Referring Physician: Same   Natasha Glass is a 77 y.o. female with a h/o DM II, OSA with CPAP, Arthritis,DVT,Gerd, obesity, anemia, HTN, that presented to PCP yesterday with not feeling well  for a couple;e of weeks. She was found to be in afib with RVR. Her BB dose was increased and ARB stopped, she was started on xarelto with a chadsvasc score of at least 5. Today Ekg shows HR reasonably controlled at 103 bpm. BP soft at 100/58. She still has complaints of fatigue/lightheadedness. She has been able to lose 36 lbs by following a low carb diet over the last several months.Last echo in 2018 with normal EF.  Today, she denies symptoms of palpitations, chest pain, shortness of breath, orthopnea, PND, lower extremity edema, dizziness, presyncope, syncope, or neurologic sequela. + for fatigue/lightheadedness.The patient is tolerating medications without difficulties and is otherwise without complaint today.   Past Medical History:  Diagnosis Date  . Anemia   . Arthritis    "legs, hands, knees, back" (04/10/2016)  . DVT (deep venous thrombosis) ([place]) 01/2009  . Dyspnea    due to weight   . GERD (gastroesophageal reflux disease)    occ.  . Gout   . Headache    "at least weekly" (04/10/2016)  . History of blood transfusion 1990s   "related to ulcerative colitis"  . History of colonic polyps 09/2010  . Hyperlipidemia   . Hypertension   . Lactose intolerance   . Macular degeneration, dry    bilateral  . Migraine    "q 2-3 months" (04/10/2016)  . Obesity   . Osteopenia   . Peripheral neuropathy   . Pneumonia 1970s  . PONV (postoperative nausea and vomiting)    hx of years ago none recent   . Pulmonary embolism ([place]) 01/2009  . Shortness of breath on exertion   . Sleep apnea    cpap pressure 4.0-16.0   . Type II diabetes mellitus ([place])    type II   . 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 Urinary incontinence    wears peripad    Past Surgical History:  Procedure Laterality Date  . ABDOMINAL EXPLORATION SURGERY  1960  . ABDOMINAL HYSTERECTOMY  1986  . CARDIAC CATHETERIZATION    . COLONOSCOPY W/ BIOPSIES AND POLYPECTOMY  2005; 2011; 2012  . COLONOSCOPY WITH PROPOFOL N/A 09/10/2016   Procedure: COLONOSCOPY WITH PROPOFOL;  Surgeon: Garlan Fair, MD;  Location: [facility];  Service: Endoscopy;  Laterality: N/A;  . JOINT REPLACEMENT    . KNEE ARTHROSCOPY Left ~ 1999  . SHOULDER OPEN ROTATOR CUFF REPAIR Left 2011  . TOTAL KNEE ARTHROPLASTY Bilateral 2005-2012   left-right    Current Outpatient Medications  Medication Sig Dispense Refill  . allopurinol (ZYLOPRIM) 300 MG tablet Take 150 mg by mouth every evening.     . Cholecalciferol (VITAMIN D) 2000 UNITS tablet Take 2,000 Units by mouth every morning.     . furosemide (LASIX) 40 MG tablet Take 40 mg by mouth daily. Take extra tablet every other day if swelling    . Magnesium 250 MG TABS Take 250 mg by mouth daily.     . metFORMIN (GLUCOPHAGE) 500 MG tablet Take 500 mg by mouth 2 (two) times daily with a meal.     . metoprolol tartrate (LOPRESSOR) 25 MG tablet Take 50 mg by mouth 2 (two) times daily.     . Multiple Vitamins-Minerals (PRESERVISION AREDS 2) CAPS Take  2 capsules by mouth daily.    . Omega-3 Fatty Acids (FISH OIL) 1000 MG CAPS Take 2,000 mg by mouth daily.     Marland Kitchen omeprazole (PRILOSEC) 20 MG capsule Take 1 capsule (20 mg total) by mouth daily. (Patient taking differently: Take 20 mg by mouth daily as needed. ) 20 capsule 0  . rivaroxaban (XARELTO) 20 MG TABS tablet Take 1 tablet (20 mg total) by mouth daily with supper. 30 tablet 0  . tetrahydrozoline 0.05 % ophthalmic solution Place 1 drop into both eyes daily as needed (for dry eyes).    Marland Kitchen sulfaSALAzine (AZULFIDINE) 500 MG tablet Take 500 mg by mouth 4 (four) times daily.     No current facility-administered medications for this encounter.     Allergies  Allergen Reactions  . Morphine And  Related Other (See Comments)    Unresponsive-Very bad reaction.  Can take hydrocodone.   [name] Allergy] Shortness Of Breath, Itching, Swelling and Rash  . Amlodipine Swelling    Of legs  . Percocet [Oxycodone-Acetaminophen] Other (See Comments)    hallucinations  . Tdap [Tetanus-Diphth-Acell Pertussis] Itching, Swelling and Rash  . Tetanus Toxoids Itching, Swelling and Rash  . Tramadol Swelling    Of legs  . Diovan [Valsartan] Other (See Comments)    Cough  . Diphtheria Toxoid Itching, Swelling and Rash  . Macrodantin [Nitrofurantoin Macrocrystal] Other (See Comments)    unspecified    Social History   Socioeconomic History  . Marital status: Widowed    Spouse name: Not on file  . Number of children: Not on file  . Years of education: Not on file  . Highest education level: Not on file  Occupational History  . Not on file  Social Needs  . Financial resource strain: Not on file  . Food insecurity:    Worry: Not on file    Inability: Not on file  . Transportation needs:    Medical: Not on file    Non-medical: Not on file  Tobacco Use  . Smoking status: Never Smoker  . Smokeless tobacco: Never Used  Substance and Sexual Activity  . Alcohol use: No  . Drug use: No  . Sexual activity: Never  Lifestyle  . Physical activity:    Days per week: Not on file    Minutes per session: Not on file  . Stress: Not on file  Relationships  . Social connections:    Talks on phone: Not on file    Gets together: Not on file    Attends religious service: Not on file    Active member of club or organization: Not on file    Attends meetings of clubs or organizations: Not on file    Relationship status: Not on file  . Intimate partner violence:    Fear of current or ex partner: Not on file    Emotionally abused: Not on file    Physically abused: Not on file    Forced sexual activity: Not on file  Other Topics Concern  . Not on file  Social History Narrative  . Not  on file    Family History  Problem Relation Age of Onset  . Colon cancer Mother   . Lung cancer Mother   . Heart disease Father     ROS- All systems are reviewed and negative except as per the HPI above  Physical Exam: Vitals:   04/07/18 1346  BP: (!) 100/58  Pulse: (!) 103  Weight: 278 lb (  126.1 kg)  Height: 5' 6"  (1.676 m)   Wt Readings from Last 3 Encounters:  04/07/18 278 lb (126.1 kg)  01/22/18 (!) 314 lb (142.4 kg)  06/18/17 (!) 314 lb (142.4 kg)    Labs: Lab Results  Component Value Date   NA 136 04/12/2016   K 3.9 04/12/2016   CL 103 04/12/2016   CO2 26 04/12/2016   GLUCOSE 124 (H) 04/12/2016   BUN 12 04/12/2016   CREATININE 0.62 04/12/2016   CALCIUM 8.5 (L) 04/12/2016   Lab Results  Component Value Date   INR 1.97 (H) 01/19/2011   Lab Results  Component Value Date   CHOL  02/07/2009    136        ATP III CLASSIFICATION:  <200     mg/dL   Desirable  200-239  mg/dL   Borderline High  >=240    mg/dL   High          HDL 45 02/07/2009   LDLCALC  02/07/2009    74        Total Cholesterol/HDL:CHD Risk Coronary Heart Disease Risk Table                     Men   Women  1/2 Average Risk   3.4   3.3  Average Risk       5.0   4.4  2 X Average Risk   9.6   7.1  3 X Average Risk  23.4   11.0        Use the calculated Patient Ratio above and the CHD Risk Table to determine the patient's CHD Risk.        ATP III CLASSIFICATION (LDL):  <100     mg/dL   Optimal  100-129  mg/dL   Near or Above                    Optimal  130-159  mg/dL   Borderline  160-189  mg/dL   High  >190     mg/dL   Very High   TRIG 84 02/07/2009     GEN- The patient is well appearing, alert and oriented x 3 today.   Head- normocephalic, atraumatic Eyes-  Sclera clear, conjunctiva pink Ears- hearing intact Oropharynx- clear Neck- supple, no JVP Lymph- no cervical lymphadenopathy Lungs- Clear to ausculation bilaterally, normal work of breathing Heart-irregular rate and  rhythm, no murmurs, rubs or gallops, PMI not laterally displaced GI- soft, NT, ND, + BS Extremities- no clubbing, cyanosis, or edema MS- no significant deformity or atrophy Skin- no rash or lesion Psych- euthymic mood, full affect Neuro- strength and sensation are intact  EKG-afib at 103 bpm, qrs int 102 ms, qtc 448 ms Echo-Study Conclusions  - Left ventricle: The cavity size was normal. Systolic function was   normal. The estimated ejection fraction was in the range of 55%   to 60%. Wall motion was normal; there were no regional wall   motion abnormalities. Left ventricular diastolic function   parameters were normal. - Mitral valve: Calcified annulus. - Left atrium: The atrium was mildly dilated. - Right atrium: The atrium was mildly dilated.    Assessment and Plan: 1. New onset afib General education re afib discussed with possible triggers Will continue current dose of metoprolol at 25 mg bid for now  Reasonable rate control Hesitate   to increase rate control at this time for soft BP and she has just had 2  additional doses of BB so far Will  not update echo as she just had one last August   2. Chadsvasc score of at least 5 Bleeding precautions discussed Will plan on cardioversion after 3 weeks of uninterrupted anticoagulation  F/u here next week  Butch Penny C. Tristyn Pharris, [facility] [address] [place],  [address] [phone]

## 2018-04-17 ENCOUNTER — Encounter (HOSPITAL_COMMUNITY): Payer: Self-pay | Admitting: Nurse Practitioner

## 2018-04-17 ENCOUNTER — Ambulatory Visit
Admission: RE | Admit: 2018-04-17 | Discharge: 2018-04-17 | Disposition: A | Discharge status: Home / Self Care | Payer: Medicare Other | Source: Ambulatory Visit | Attending: Internal Medicine | Admitting: Internal Medicine

## 2018-04-17 ENCOUNTER — Ambulatory Visit (HOSPITAL_COMMUNITY)
Admission: RE | Admit: 2018-04-17 | Discharge: 2018-04-17 | Disposition: A | Discharge status: Home / Self Care | Payer: Medicare Other | Source: Ambulatory Visit | Attending: Nurse Practitioner | Admitting: Nurse Practitioner

## 2018-04-17 VITALS — BP 104/64 | HR 99 | Ht 66.0 in | Wt 277.8 lb

## 2018-04-17 DIAGNOSIS — G4733 Obstructive sleep apnea (adult) (pediatric): Secondary | ICD-10-CM | POA: Diagnosis not present

## 2018-04-17 DIAGNOSIS — Z7901 Long term (current) use of anticoagulants: Secondary | ICD-10-CM | POA: Insufficient documentation

## 2018-04-17 DIAGNOSIS — D649 Anemia, unspecified: Secondary | ICD-10-CM | POA: Insufficient documentation

## 2018-04-17 DIAGNOSIS — Z86718 Personal history of other venous thrombosis and embolism: Secondary | ICD-10-CM | POA: Diagnosis not present

## 2018-04-17 DIAGNOSIS — Z86711 Personal history of pulmonary embolism: Secondary | ICD-10-CM | POA: Diagnosis not present

## 2018-04-17 DIAGNOSIS — Z1231 Encounter for screening mammogram for malignant neoplasm of breast: Secondary | ICD-10-CM

## 2018-04-17 DIAGNOSIS — E1140 Type 2 diabetes mellitus with diabetic neuropathy, unspecified: Secondary | ICD-10-CM | POA: Diagnosis not present

## 2018-04-17 DIAGNOSIS — I4891 Unspecified atrial fibrillation: Secondary | ICD-10-CM | POA: Diagnosis present

## 2018-04-17 DIAGNOSIS — H3530 Unspecified macular degeneration: Secondary | ICD-10-CM | POA: Diagnosis not present

## 2018-04-17 DIAGNOSIS — Z79899 Other long term (current) drug therapy: Secondary | ICD-10-CM | POA: Insufficient documentation

## 2018-04-17 DIAGNOSIS — Z7984 Long term (current) use of oral hypoglycemic drugs: Secondary | ICD-10-CM | POA: Insufficient documentation

## 2018-04-17 DIAGNOSIS — E669 Obesity, unspecified: Secondary | ICD-10-CM | POA: Diagnosis not present

## 2018-04-17 DIAGNOSIS — E739 Lactose intolerance, unspecified: Secondary | ICD-10-CM | POA: Insufficient documentation

## 2018-04-17 DIAGNOSIS — M109 Gout, unspecified: Secondary | ICD-10-CM | POA: Insufficient documentation

## 2018-04-17 DIAGNOSIS — K219 Gastro-esophageal reflux disease without esophagitis: Secondary | ICD-10-CM | POA: Diagnosis not present

## 2018-04-17 DIAGNOSIS — I10 Essential (primary) hypertension: Secondary | ICD-10-CM | POA: Insufficient documentation

## 2018-04-17 DIAGNOSIS — M8580 Other specified disorders of bone density and structure, unspecified site: Secondary | ICD-10-CM | POA: Diagnosis not present

## 2018-04-17 DIAGNOSIS — E785 Hyperlipidemia, unspecified: Secondary | ICD-10-CM | POA: Insufficient documentation

## 2018-04-17 NOTE — Progress Notes (Signed)
Primary Care Physician: Josetta Huddle, MD Referring Physician: Same   Natasha Glass is a 77 y.o. female with a h/o DM II, OSA with CPAP, Arthritis,DVT,Gerd, obesity, anemia, HTN, that presented to PCP yesterday with not feeling well  for a couple of weeks. She was found to be in afib with RVR. Her BB dose was increased and ARB stopped, she was started on xarelto with a chadsvasc score of at least 5.  On initial visit, 6/25,  Ekg showed HR reasonably controlled at 103 bpm. BP soft at 100/58. She still has complaints of fatigue/lightheadedness. She has been able to lose 36 lbs by following a low carb diet over the last several months.Last echo in 2018 with normal EF.  F/u in afib clinic, 7/5. She is tolerating afib reasonably well. We are waiting for 3 weeks of anticoagulation for attempt at cardioversion. Weight is stable. HR controlled at 99 bpm, BP still soft to attempt higher doses of rate control.Walks with a walker but is still able to get out to grocery store and get her groceries in afib.she has chronic LLE but it is stable with longstanding use of lasix.  Today, she denies symptoms of palpitations, chest pain, shortness of breath, orthopnea, PND, lower extremity edema, dizziness, presyncope, syncope, or neurologic sequela. + for fatigue/lightheadedness.The patient is tolerating medications without difficulties and is otherwise without complaint today.   Past Medical History:  Diagnosis Date  . Anemia   . Arthritis    "legs, hands, knees, back" (04/10/2016)  . DVT (deep venous thrombosis) ([place]) 01/2009  . Dyspnea    due to weight   . GERD (gastroesophageal reflux disease)    occ.  . Gout   . Headache    "at least weekly" (04/10/2016)  . History of blood transfusion 1990s   "related to ulcerative colitis"  . History of colonic polyps 09/2010  . Hyperlipidemia   . Hypertension   . Lactose intolerance   . Macular degeneration, dry    bilateral  . Migraine    "q 2-3 months"  (04/10/2016)  . Obesity   . Osteopenia   . Peripheral neuropathy   . Pneumonia 1970s  . PONV (postoperative nausea and vomiting)    hx of years ago none recent   . Pulmonary embolism ([place]) 01/2009  . Shortness of breath on exertion   . Sleep apnea    cpap pressure 4.0-16.0   . Type II diabetes mellitus ([place])    type II   . 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 Urinary incontinence    wears peripad   Past Surgical History:  Procedure Laterality Date  . ABDOMINAL EXPLORATION SURGERY  1960  . ABDOMINAL HYSTERECTOMY  1986  . CARDIAC CATHETERIZATION    . COLONOSCOPY W/ BIOPSIES AND POLYPECTOMY  2005; 2011; 2012  . COLONOSCOPY WITH PROPOFOL N/A 09/10/2016   Procedure: COLONOSCOPY WITH PROPOFOL;  Surgeon: Garlan Fair, MD;  Location: [facility];  Service: Endoscopy;  Laterality: N/A;  . JOINT REPLACEMENT    . KNEE ARTHROSCOPY Left ~ 1999  . SHOULDER OPEN ROTATOR CUFF REPAIR Left 2011  . TOTAL KNEE ARTHROPLASTY Bilateral 2005-2012   left-right    Current Outpatient Medications  Medication Sig Dispense Refill  . allopurinol (ZYLOPRIM) 300 MG tablet Take 150 mg by mouth every evening.     . Cholecalciferol (VITAMIN D) 2000 UNITS tablet Take 2,000 Units by mouth every morning.     . furosemide (LASIX) 40 MG tablet Take 40 mg by  mouth daily. Take extra tablet every other day if swelling    . Magnesium 250 MG TABS Take 250 mg by mouth daily.     . metFORMIN (GLUCOPHAGE) 500 MG tablet Take 500 mg by mouth 2 (two) times daily with a meal.     . metoprolol tartrate (LOPRESSOR) 25 MG tablet Take 50 mg by mouth 2 (two) times daily.     . Multiple Vitamins-Minerals (PRESERVISION AREDS 2) CAPS Take 2 capsules by mouth daily.    . Omega-3 Fatty Acids (FISH OIL) 1000 MG CAPS Take 2,000 mg by mouth daily.     Marland Kitchen omeprazole (PRILOSEC) 20 MG capsule Take 1 capsule (20 mg total) by mouth daily. (Patient taking differently: Take 20 mg by mouth daily as needed. ) 20 capsule 0  .  rivaroxaban (XARELTO) 20 MG TABS tablet Take 1 tablet (20 mg total) by mouth daily with supper. 30 tablet 0  . sulfaSALAzine (AZULFIDINE) 500 MG tablet Take 500 mg by mouth 4 (four) times daily.    Marland Kitchen tetrahydrozoline 0.05 % ophthalmic solution Place 1 drop into both eyes daily as needed (for dry eyes).     No current facility-administered medications for this encounter.     Allergies  Allergen Reactions  . Morphine And Related Other (See Comments)    Unresponsive-Very bad reaction.  Can take hydrocodone.   [name] Allergy] Shortness Of Breath, Itching, Swelling and Rash  . Amlodipine Swelling    Of legs  . Percocet [Oxycodone-Acetaminophen] Other (See Comments)    hallucinations  . Tdap [Tetanus-Diphth-Acell Pertussis] Itching, Swelling and Rash  . Tetanus Toxoids Itching, Swelling and Rash  . Tramadol Swelling    Of legs  . Diovan [Valsartan] Other (See Comments)    Cough  . Diphtheria Toxoid Itching, Swelling and Rash  . Macrodantin [Nitrofurantoin Macrocrystal] Other (See Comments)    unspecified    Social History   Socioeconomic History  . Marital status: Widowed    Spouse name: Not on file  . Number of children: Not on file  . Years of education: Not on file  . Highest education level: Not on file  Occupational History  . Not on file  Social Needs  . Financial resource strain: Not on file  . Food insecurity:    Worry: Not on file    Inability: Not on file  . Transportation needs:    Medical: Not on file    Non-medical: Not on file  Tobacco Use  . Smoking status: Never Smoker  . Smokeless tobacco: Never Used  Substance and Sexual Activity  . Alcohol use: No  . Drug use: No  . Sexual activity: Never  Lifestyle  . Physical activity:    Days per week: Not on file    Minutes per session: Not on file  . Stress: Not on file  Relationships  . Social connections:    Talks on phone: Not on file    Gets together: Not on file    Attends religious  service: Not on file    Active member of club or organization: Not on file    Attends meetings of clubs or organizations: Not on file    Relationship status: Not on file  . Intimate partner violence:    Fear of current or ex partner: Not on file    Emotionally abused: Not on file    Physically abused: Not on file    Forced sexual activity: Not on file  Other Topics Concern  .  Not on file  Social History Narrative  . Not on file    Family History  Problem Relation Age of Onset  . Colon cancer Mother   . Lung cancer Mother   . Heart disease Father     ROS- All systems are reviewed and negative except as per the HPI above  Physical Exam: Vitals:   04/17/18 1010  BP: 104/64  Pulse: 99  Weight: 277 lb 12.8 oz (126 kg)  Height: 5' 6"  (1.676 m)   Wt Readings from Last 3 Encounters:  04/17/18 277 lb 12.8 oz (126 kg)  04/07/18 278 lb (126.1 kg)  01/22/18 (!) 314 lb (142.4 kg)    Labs: Lab Results  Component Value Date   NA 136 04/12/2016   K 3.9 04/12/2016   CL 103 04/12/2016   CO2 26 04/12/2016   GLUCOSE 124 (H) 04/12/2016   BUN 12 04/12/2016   CREATININE 0.62 04/12/2016   CALCIUM 8.5 (L) 04/12/2016   Lab Results  Component Value Date   INR 1.97 (H) 01/19/2011   Lab Results  Component Value Date   CHOL  02/07/2009    136        ATP III CLASSIFICATION:  <200     mg/dL   Desirable  200-239  mg/dL   Borderline High  >=240    mg/dL   High          HDL 45 02/07/2009   LDLCALC  02/07/2009    74        Total Cholesterol/HDL:CHD Risk Coronary Heart Disease Risk Table                     Men   Women  1/2 Average Risk   3.4   3.3  Average Risk       5.0   4.4  2 X Average Risk   9.6   7.1  3 X Average Risk  23.4   11.0        Use the calculated Patient Ratio above and the CHD Risk Table to determine the patient's CHD Risk.        ATP III CLASSIFICATION (LDL):  <100     mg/dL   Optimal  100-129  mg/dL   Near or Above                    Optimal  130-159   mg/dL   Borderline  160-189  mg/dL   High  >190     mg/dL   Very High   TRIG 84 02/07/2009     GEN- The patient is well appearing, alert and oriented x 3 today.   Head- normocephalic, atraumatic Eyes-  Sclera clear, conjunctiva pink Ears- hearing intact Oropharynx- clear Neck- supple, no JVP Lymph- no cervical lymphadenopathy Lungs- Clear to ausculation bilaterally, normal work of breathing Heart-irregular rate and rhythm, no murmurs, rubs or gallops, PMI not laterally displaced GI- soft, NT, ND, + BS Extremities- no clubbing, cyanosis, or edema MS- no significant deformity or atrophy Skin- no rash or lesion Psych- euthymic mood, full affect Neuro- strength and sensation are intact  EKG-afib at 99 bpm, qrs int 102 ms, qtc 448 ms Echo-Study Conclusions  - Left ventricle: The cavity size was normal. Systolic function was   normal. The estimated ejection fraction was in the range of 55%   to 60%. Wall motion was normal; there were no regional wall   motion abnormalities. Left ventricular diastolic function  parameters were normal. - Mitral valve: Calcified annulus. - Left atrium: The atrium was mildly dilated. - Right atrium: The atrium was mildly dilated.    Assessment and Plan: 1. New onset afib General education re afib discussed with possible triggers Will continue current dose of metoprolol at 25 mg bid for now  Reasonable rate control Hesitate   to increase rate control at this time for soft BP Will  not update echo as she just had one last August   2. Chadsvasc score of at least 5 Bleeding precautions discussed Will plan on cardioversion after 3 weeks of uninterrupted anticoagulation Asked to stop fish oil as she has noted some bruising on arms  She will be seen 7/17 and be set up for cardioversion, given no missed doses of xarelto    Butch Penny C. Dietrich Samuelson, [facility] [address] [place], [place] [address] [phone]

## 2018-04-29 ENCOUNTER — Other Ambulatory Visit (HOSPITAL_COMMUNITY): Payer: Self-pay | Admitting: *Deleted

## 2018-04-29 ENCOUNTER — Encounter (HOSPITAL_COMMUNITY): Payer: Self-pay | Admitting: Nurse Practitioner

## 2018-04-29 ENCOUNTER — Ambulatory Visit (HOSPITAL_COMMUNITY)
Admission: RE | Admit: 2018-04-29 | Discharge: 2018-04-29 | Disposition: A | Discharge status: Home / Self Care | Payer: Medicare Other | Source: Ambulatory Visit | Attending: Nurse Practitioner | Admitting: Nurse Practitioner

## 2018-04-29 VITALS — BP 122/80 | HR 123 | Ht 66.0 in | Wt 274.2 lb

## 2018-04-29 DIAGNOSIS — M1990 Unspecified osteoarthritis, unspecified site: Secondary | ICD-10-CM | POA: Diagnosis not present

## 2018-04-29 DIAGNOSIS — Z86718 Personal history of other venous thrombosis and embolism: Secondary | ICD-10-CM | POA: Diagnosis not present

## 2018-04-29 DIAGNOSIS — Z7901 Long term (current) use of anticoagulants: Secondary | ICD-10-CM | POA: Insufficient documentation

## 2018-04-29 DIAGNOSIS — K5190 Ulcerative colitis, unspecified, without complications: Secondary | ICD-10-CM | POA: Insufficient documentation

## 2018-04-29 DIAGNOSIS — E785 Hyperlipidemia, unspecified: Secondary | ICD-10-CM | POA: Insufficient documentation

## 2018-04-29 DIAGNOSIS — E669 Obesity, unspecified: Secondary | ICD-10-CM | POA: Insufficient documentation

## 2018-04-29 DIAGNOSIS — I4891 Unspecified atrial fibrillation: Secondary | ICD-10-CM | POA: Diagnosis not present

## 2018-04-29 DIAGNOSIS — I10 Essential (primary) hypertension: Secondary | ICD-10-CM | POA: Insufficient documentation

## 2018-04-29 DIAGNOSIS — Z79899 Other long term (current) drug therapy: Secondary | ICD-10-CM | POA: Diagnosis not present

## 2018-04-29 DIAGNOSIS — I712 Thoracic aortic aneurysm, without rupture: Secondary | ICD-10-CM | POA: Insufficient documentation

## 2018-04-29 DIAGNOSIS — Z801 Family history of malignant neoplasm of trachea, bronchus and lung: Secondary | ICD-10-CM | POA: Insufficient documentation

## 2018-04-29 DIAGNOSIS — Z887 Allergy status to serum and vaccine status: Secondary | ICD-10-CM | POA: Insufficient documentation

## 2018-04-29 DIAGNOSIS — Z8249 Family history of ischemic heart disease and other diseases of the circulatory system: Secondary | ICD-10-CM | POA: Diagnosis not present

## 2018-04-29 DIAGNOSIS — Z888 Allergy status to other drugs, medicaments and biological substances status: Secondary | ICD-10-CM | POA: Insufficient documentation

## 2018-04-29 DIAGNOSIS — M8580 Other specified disorders of bone density and structure, unspecified site: Secondary | ICD-10-CM | POA: Insufficient documentation

## 2018-04-29 DIAGNOSIS — K219 Gastro-esophageal reflux disease without esophagitis: Secondary | ICD-10-CM | POA: Diagnosis not present

## 2018-04-29 DIAGNOSIS — E739 Lactose intolerance, unspecified: Secondary | ICD-10-CM | POA: Insufficient documentation

## 2018-04-29 DIAGNOSIS — E1142 Type 2 diabetes mellitus with diabetic polyneuropathy: Secondary | ICD-10-CM | POA: Insufficient documentation

## 2018-04-29 DIAGNOSIS — Z7984 Long term (current) use of oral hypoglycemic drugs: Secondary | ICD-10-CM | POA: Insufficient documentation

## 2018-04-29 DIAGNOSIS — Z800 Family history of malignant neoplasm of digestive organs: Secondary | ICD-10-CM | POA: Diagnosis not present

## 2018-04-29 DIAGNOSIS — Z86711 Personal history of pulmonary embolism: Secondary | ICD-10-CM | POA: Diagnosis not present

## 2018-04-29 DIAGNOSIS — Z96653 Presence of artificial knee joint, bilateral: Secondary | ICD-10-CM | POA: Insufficient documentation

## 2018-04-29 DIAGNOSIS — Z91013 Allergy to seafood: Secondary | ICD-10-CM | POA: Insufficient documentation

## 2018-04-29 DIAGNOSIS — Z6841 Body Mass Index (BMI) 40.0 and over, adult: Secondary | ICD-10-CM | POA: Insufficient documentation

## 2018-04-29 DIAGNOSIS — Z885 Allergy status to narcotic agent status: Secondary | ICD-10-CM | POA: Insufficient documentation

## 2018-04-29 DIAGNOSIS — M109 Gout, unspecified: Secondary | ICD-10-CM | POA: Insufficient documentation

## 2018-04-29 DIAGNOSIS — G4733 Obstructive sleep apnea (adult) (pediatric): Secondary | ICD-10-CM | POA: Diagnosis not present

## 2018-04-29 DIAGNOSIS — Z90710 Acquired absence of both cervix and uterus: Secondary | ICD-10-CM | POA: Insufficient documentation

## 2018-04-29 DIAGNOSIS — H3530 Unspecified macular degeneration: Secondary | ICD-10-CM | POA: Insufficient documentation

## 2018-04-29 LAB — BASIC METABOLIC PANEL
Anion gap: 10 (ref 5–15)
BUN: 17 mg/dL (ref 8–23)
CALCIUM: 9.6 mg/dL (ref 8.9–10.3)
CO2: 28 mmol/L (ref 22–32)
Chloride: 105 mmol/L (ref 98–111)
Creatinine, Ser: 0.67 mg/dL (ref 0.44–1.00)
GFR calc Af Amer: >60 mL/min (ref >60)
GFR calc non Af Amer: >60 mL/min (ref >60)
Glucose, Bld: 164 mg/dL — ABNORMAL HIGH (ref 70–99)
Potassium: 3.5 mmol/L (ref 3.5–5.1)
Sodium: 143 mmol/L (ref 135–145)

## 2018-04-29 LAB — CBC
HEMATOCRIT: 42.5 % (ref 36.0–46.0)
Hemoglobin: 13.5 g/dL (ref 12.0–15.0)
MCH: 27.8 pg (ref 26.0–34.0)
MCHC: 31.8 g/dL (ref 30.0–36.0)
MCV: 87.4 fL (ref 78.0–100.0)
Platelets: 139 10*3/uL — ABNORMAL LOW (ref 150–400)
RBC: 4.86 MIL/uL (ref 3.87–5.11)
RDW: 17 % — AB (ref 11.5–15.5)
WBC: 5.7 10*3/uL (ref 4.0–10.5)

## 2018-04-29 NOTE — Patient Instructions (Signed)
Cardioversion scheduled for Thursday, July 25th  - Arrive at the [facility] and go to admitting at [facility] not eat or drink anything after midnight the night prior to your procedure.  - Take all your medication with a sip of water prior to arrival.  - You will not be able to drive home after your procedure.

## 2018-04-29 NOTE — H&P (View-Only) (Signed)
Primary Care Physician: Josetta Huddle, MD Referring Physician: Same   Natasha Glass is a 77 y.o. 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 Cyndia Bent,  DM II, OSA with CPAP, Arthritis,DVT,Gerd, obesity, anemia, HTN,chronic LLE, that presented to PCP recently  with not feeling well  for a couple of weeks. She was found to be in afib with RVR. Her BB dose was increased and ARB stopped, she was started on xarelto with a chadsvasc score of at least 5.  On initial visit, 6/25,  Ekg showed HR reasonably controlled at 103 bpm. BP soft at 100/58. She  has complaints of fatigue/lightheadedness. She has been able to lose 36 lbs by following a low carb diet over the last several months.Last echo in 2018 with normal EF.  F/u in afib clinic, 7/5. She is tolerating afib reasonably well. We are waiting for 3 weeks of anticoagulation for attempt at cardioversion. Weight is stable. HR controlled at 99 bpm, BP still soft to attempt higher doses of rate control.Walks with a walker but is still able to get out to grocery store and get her groceries in afib. She has chronic LLE but it is stable with longstanding use of lasix.  F/u in afib clinic. She now has had 3 weeks of uninterrupted anticoagulation and will be scheduled for cardioversion. Her weight is stable, down x 3 lbs.  Today, she denies symptoms of palpitations, chest pain, shortness of breath, orthopnea, PND, lower extremity edema, dizziness, presyncope, syncope, or neurologic sequela. + for fatigue/lightheadedness.The patient is tolerating medications without difficulties and is otherwise without complaint today.   Past Medical History:  Diagnosis Date  . Anemia   . Arthritis    "legs, hands, knees, back" (04/10/2016)  . DVT (deep venous thrombosis) ([place]) 01/2009  . Dyspnea    due to weight   . GERD (gastroesophageal reflux disease)    occ.  . Gout   . Headache    "at least weekly" (04/10/2016)  . History of blood  transfusion 1990s   "related to ulcerative colitis"  . History of colonic polyps 09/2010  . Hyperlipidemia   . Hypertension   . Lactose intolerance   . Macular degeneration, dry    bilateral  . Migraine    "q 2-3 months" (04/10/2016)  . Obesity   . Osteopenia   . Peripheral neuropathy   . Pneumonia 1970s  . PONV (postoperative nausea and vomiting)    hx of years ago none recent   . Pulmonary embolism ([place]) 01/2009  . Shortness of breath on exertion   . Sleep apnea    cpap pressure 4.0-16.0   . Type II diabetes mellitus ([place])    type II   . 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 Urinary incontinence    wears peripad   Past Surgical History:  Procedure Laterality Date  . ABDOMINAL EXPLORATION SURGERY  1960  . ABDOMINAL HYSTERECTOMY  1986  . CARDIAC CATHETERIZATION    . COLONOSCOPY W/ BIOPSIES AND POLYPECTOMY  2005; 2011; 2012  . COLONOSCOPY WITH PROPOFOL N/A 09/10/2016   Procedure: COLONOSCOPY WITH PROPOFOL;  Surgeon: Garlan Fair, MD;  Location: [facility];  Service: Endoscopy;  Laterality: N/A;  . JOINT REPLACEMENT    . KNEE ARTHROSCOPY Left ~ 1999  . SHOULDER OPEN ROTATOR CUFF REPAIR Left 2011  . TOTAL KNEE ARTHROPLASTY Bilateral 2005-2012   left-right    Current Outpatient Medications  Medication Sig Dispense Refill  . allopurinol (  ZYLOPRIM) 300 MG tablet Take 150 mg by mouth every evening.     . Cholecalciferol (VITAMIN D) 2000 UNITS tablet Take 2,000 Units by mouth every morning.     . furosemide (LASIX) 40 MG tablet Take 40 mg by mouth daily. Take extra tablet every other day if swelling    . Magnesium 250 MG TABS Take 250 mg by mouth daily.     . metFORMIN (GLUCOPHAGE) 500 MG tablet Take 500 mg by mouth 2 (two) times daily with a meal.     . metoprolol tartrate (LOPRESSOR) 25 MG tablet Take 50 mg by mouth 2 (two) times daily.     . Multiple Vitamins-Minerals (PRESERVISION AREDS 2) CAPS Take 2 capsules by mouth daily.    Marland Kitchen omeprazole (PRILOSEC) 20  MG capsule Take 1 capsule (20 mg total) by mouth daily. (Patient taking differently: Take 20 mg by mouth daily as needed. ) 20 capsule 0  . rivaroxaban (XARELTO) 20 MG TABS tablet Take 1 tablet (20 mg total) by mouth daily with supper. 30 tablet 0  . tetrahydrozoline 0.05 % ophthalmic solution Place 1 drop into both eyes daily as needed (for dry eyes).    Marland Kitchen sulfaSALAzine (AZULFIDINE) 500 MG tablet Take 500 mg by mouth 4 (four) times daily.     No current facility-administered medications for this encounter.     Allergies  Allergen Reactions  . Morphine And Related Other (See Comments)    Unresponsive-Very bad reaction.  Can take hydrocodone.   [name] Allergy] Shortness Of Breath, Itching, Swelling and Rash  . Amlodipine Swelling    Of legs  . Percocet [Oxycodone-Acetaminophen] Other (See Comments)    hallucinations  . Tdap [Tetanus-Diphth-Acell Pertussis] Itching, Swelling and Rash  . Tetanus Toxoids Itching, Swelling and Rash  . Tramadol Swelling    Of legs  . Diovan [Valsartan] Other (See Comments)    Cough  . Diphtheria Toxoid Itching, Swelling and Rash  . Macrodantin [Nitrofurantoin Macrocrystal] Other (See Comments)    unspecified    Social History   Socioeconomic History  . Marital status: Widowed    Spouse name: Not on file  . Number of children: Not on file  . Years of education: Not on file  . Highest education level: Not on file  Occupational History  . Not on file  Social Needs  . Financial resource strain: Not on file  . Food insecurity:    Worry: Not on file    Inability: Not on file  . Transportation needs:    Medical: Not on file    Non-medical: Not on file  Tobacco Use  . Smoking status: Never Smoker  . Smokeless tobacco: Never Used  Substance and Sexual Activity  . Alcohol use: No  . Drug use: No  . Sexual activity: Never  Lifestyle  . Physical activity:    Days per week: Not on file    Minutes per session: Not on file  . Stress:  Not on file  Relationships  . Social connections:    Talks on phone: Not on file    Gets together: Not on file    Attends religious service: Not on file    Active member of club or organization: Not on file    Attends meetings of clubs or organizations: Not on file    Relationship status: Not on file  . Intimate partner violence:    Fear of current or ex partner: Not on file    Emotionally abused: Not  on file    Physically abused: Not on file    Forced sexual activity: Not on file  Other Topics Concern  . Not on file  Social History Narrative  . Not on file    Family History  Problem Relation Age of Onset  . Colon cancer Mother   . Lung cancer Mother   . Heart disease Father     ROS- All systems are reviewed and negative except as per the HPI above  Physical Exam: Vitals:   04/29/18 0911  BP: 122/80  Pulse: (!) 123  Weight: 274 lb 3.2 oz (124.4 kg)  Height: 5' 6"  (1.676 m)   Wt Readings from Last 3 Encounters:  04/29/18 274 lb 3.2 oz (124.4 kg)  04/17/18 277 lb 12.8 oz (126 kg)  04/07/18 278 lb (126.1 kg)    Labs: Lab Results  Component Value Date   NA 136 04/12/2016   K 3.9 04/12/2016   CL 103 04/12/2016   CO2 26 04/12/2016   GLUCOSE 124 (H) 04/12/2016   BUN 12 04/12/2016   CREATININE 0.62 04/12/2016   CALCIUM 8.5 (L) 04/12/2016   Lab Results  Component Value Date   INR 1.97 (H) 01/19/2011   Lab Results  Component Value Date   CHOL  02/07/2009    136        ATP III CLASSIFICATION:  <200     mg/dL   Desirable  200-239  mg/dL   Borderline High  >=240    mg/dL   High          HDL 45 02/07/2009   LDLCALC  02/07/2009    74        Total Cholesterol/HDL:CHD Risk Coronary Heart Disease Risk Table                     Men   Women  1/2 Average Risk   3.4   3.3  Average Risk       5.0   4.4  2 X Average Risk   9.6   7.1  3 X Average Risk  23.4   11.0        Use the calculated Patient Ratio above and the CHD Risk Table to determine the patient's  CHD Risk.        ATP III CLASSIFICATION (LDL):  <100     mg/dL   Optimal  100-129  mg/dL   Near or Above                    Optimal  130-159  mg/dL   Borderline  160-189  mg/dL   High  >190     mg/dL   Very High   TRIG 84 02/07/2009     GEN- The patient is well appearing, alert and oriented x 3 today.   Head- normocephalic, atraumatic Eyes-  Sclera clear, conjunctiva pink Ears- hearing intact Oropharynx- clear Neck- supple, no JVP Lymph- no cervical lymphadenopathy Lungs- Clear to ausculation bilaterally, normal work of breathing Heart-irregular rate and rhythm, no murmurs, rubs or gallops, PMI not laterally displaced GI- soft, NT, ND, + BS Extremities- no clubbing, cyanosis, or edema MS- no significant deformity or atrophy Skin- no rash or lesion Psych- euthymic mood, full affect Neuro- strength and sensation are intact  EKG-afib at 123 bpm, qrs int 102 ms, qtc 448 ms Echo-Study Conclusions  - Left ventricle: The cavity size was normal. Systolic function was   normal. The estimated ejection fraction was  in the range of 55%   to 60%. Wall motion was normal; there were no regional wall   motion abnormalities. Left ventricular diastolic function   parameters were normal. - Mitral valve: Calcified annulus. - Left atrium: The atrium was mildly dilated. - Right atrium: The atrium was mildly dilated.    Assessment and Plan: 1. New onset afib General education re afib discussed with possible triggers Will continue current dose of metoprolol at 25 mg bid   Reasonable rate control, slower at home per BP monitor less than 100 Will  not update echo as she just had one last August   2. Chadsvasc score of at least 5 No missed doses Bleeding precautions discussed Will now schedule cardioversion  Risk vrs benefit of procedure discussed  She will be seen one week following cardioversion    Butch Penny C. Glendell Fouse, [facility] [address] [place], [place] [address] [phone]

## 2018-04-29 NOTE — Progress Notes (Signed)
Primary Care Physician: Josetta Huddle, MD Referring Physician: Same   Natasha Glass is a 77 y.o. 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 Cyndia Bent,  DM II, OSA with CPAP, Arthritis,DVT,Gerd, obesity, anemia, HTN,chronic LLE, that presented to PCP recently  with not feeling well  for a couple of weeks. She was found to be in afib with RVR. Her BB dose was increased and ARB stopped, she was started on xarelto with a chadsvasc score of at least 5.  On initial visit, 6/25,  Ekg showed HR reasonably controlled at 103 bpm. BP soft at 100/58. She  has complaints of fatigue/lightheadedness. She has been able to lose 36 lbs by following a low carb diet over the last several months.Last echo in 2018 with normal EF.  F/u in afib clinic, 7/5. She is tolerating afib reasonably well. We are waiting for 3 weeks of anticoagulation for attempt at cardioversion. Weight is stable. HR controlled at 99 bpm, BP still soft to attempt higher doses of rate control.Walks with a walker but is still able to get out to grocery store and get her groceries in afib. She has chronic LLE but it is stable with longstanding use of lasix.  F/u in afib clinic. She now has had 3 weeks of uninterrupted anticoagulation and will be scheduled for cardioversion. Her weight is stable, down x 3 lbs.  Today, she denies symptoms of palpitations, chest pain, shortness of breath, orthopnea, PND, lower extremity edema, dizziness, presyncope, syncope, or neurologic sequela. + for fatigue/lightheadedness.The patient is tolerating medications without difficulties and is otherwise without complaint today.   Past Medical History:  Diagnosis Date  . Anemia   . Arthritis    "legs, hands, knees, back" (04/10/2016)  . DVT (deep venous thrombosis) ([place]) 01/2009  . Dyspnea    due to weight   . GERD (gastroesophageal reflux disease)    occ.  . Gout   . Headache    "at least weekly" (04/10/2016)  . History of blood  transfusion 1990s   "related to ulcerative colitis"  . History of colonic polyps 09/2010  . Hyperlipidemia   . Hypertension   . Lactose intolerance   . Macular degeneration, dry    bilateral  . Migraine    "q 2-3 months" (04/10/2016)  . Obesity   . Osteopenia   . Peripheral neuropathy   . Pneumonia 1970s  . PONV (postoperative nausea and vomiting)    hx of years ago none recent   . Pulmonary embolism ([place]) 01/2009  . Shortness of breath on exertion   . Sleep apnea    cpap pressure 4.0-16.0   . Type II diabetes mellitus ([place])    type II   . 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 Urinary incontinence    wears peripad   Past Surgical History:  Procedure Laterality Date  . ABDOMINAL EXPLORATION SURGERY  1960  . ABDOMINAL HYSTERECTOMY  1986  . CARDIAC CATHETERIZATION    . COLONOSCOPY W/ BIOPSIES AND POLYPECTOMY  2005; 2011; 2012  . COLONOSCOPY WITH PROPOFOL N/A 09/10/2016   Procedure: COLONOSCOPY WITH PROPOFOL;  Surgeon: Garlan Fair, MD;  Location: [facility];  Service: Endoscopy;  Laterality: N/A;  . JOINT REPLACEMENT    . KNEE ARTHROSCOPY Left ~ 1999  . SHOULDER OPEN ROTATOR CUFF REPAIR Left 2011  . TOTAL KNEE ARTHROPLASTY Bilateral 2005-2012   left-right    Current Outpatient Medications  Medication Sig Dispense Refill  . allopurinol (  ZYLOPRIM) 300 MG tablet Take 150 mg by mouth every evening.     . Cholecalciferol (VITAMIN D) 2000 UNITS tablet Take 2,000 Units by mouth every morning.     . furosemide (LASIX) 40 MG tablet Take 40 mg by mouth daily. Take extra tablet every other day if swelling    . Magnesium 250 MG TABS Take 250 mg by mouth daily.     . metFORMIN (GLUCOPHAGE) 500 MG tablet Take 500 mg by mouth 2 (two) times daily with a meal.     . metoprolol tartrate (LOPRESSOR) 25 MG tablet Take 50 mg by mouth 2 (two) times daily.     . Multiple Vitamins-Minerals (PRESERVISION AREDS 2) CAPS Take 2 capsules by mouth daily.    Marland Kitchen omeprazole (PRILOSEC) 20  MG capsule Take 1 capsule (20 mg total) by mouth daily. (Patient taking differently: Take 20 mg by mouth daily as needed. ) 20 capsule 0  . rivaroxaban (XARELTO) 20 MG TABS tablet Take 1 tablet (20 mg total) by mouth daily with supper. 30 tablet 0  . tetrahydrozoline 0.05 % ophthalmic solution Place 1 drop into both eyes daily as needed (for dry eyes).    Marland Kitchen sulfaSALAzine (AZULFIDINE) 500 MG tablet Take 500 mg by mouth 4 (four) times daily.     No current facility-administered medications for this encounter.     Allergies  Allergen Reactions  . Morphine And Related Other (See Comments)    Unresponsive-Very bad reaction.  Can take hydrocodone.   [name] Allergy] Shortness Of Breath, Itching, Swelling and Rash  . Amlodipine Swelling    Of legs  . Percocet [Oxycodone-Acetaminophen] Other (See Comments)    hallucinations  . Tdap [Tetanus-Diphth-Acell Pertussis] Itching, Swelling and Rash  . Tetanus Toxoids Itching, Swelling and Rash  . Tramadol Swelling    Of legs  . Diovan [Valsartan] Other (See Comments)    Cough  . Diphtheria Toxoid Itching, Swelling and Rash  . Macrodantin [Nitrofurantoin Macrocrystal] Other (See Comments)    unspecified    Social History   Socioeconomic History  . Marital status: Widowed    Spouse name: Not on file  . Number of children: Not on file  . Years of education: Not on file  . Highest education level: Not on file  Occupational History  . Not on file  Social Needs  . Financial resource strain: Not on file  . Food insecurity:    Worry: Not on file    Inability: Not on file  . Transportation needs:    Medical: Not on file    Non-medical: Not on file  Tobacco Use  . Smoking status: Never Smoker  . Smokeless tobacco: Never Used  Substance and Sexual Activity  . Alcohol use: No  . Drug use: No  . Sexual activity: Never  Lifestyle  . Physical activity:    Days per week: Not on file    Minutes per session: Not on file  . Stress:  Not on file  Relationships  . Social connections:    Talks on phone: Not on file    Gets together: Not on file    Attends religious service: Not on file    Active member of club or organization: Not on file    Attends meetings of clubs or organizations: Not on file    Relationship status: Not on file  . Intimate partner violence:    Fear of current or ex partner: Not on file    Emotionally abused: Not  on file    Physically abused: Not on file    Forced sexual activity: Not on file  Other Topics Concern  . Not on file  Social History Narrative  . Not on file    Family History  Problem Relation Age of Onset  . Colon cancer Mother   . Lung cancer Mother   . Heart disease Father     ROS- All systems are reviewed and negative except as per the HPI above  Physical Exam: Vitals:   04/29/18 0911  BP: 122/80  Pulse: (!) 123  Weight: 274 lb 3.2 oz (124.4 kg)  Height: 5' 6"  (1.676 m)   Wt Readings from Last 3 Encounters:  04/29/18 274 lb 3.2 oz (124.4 kg)  04/17/18 277 lb 12.8 oz (126 kg)  04/07/18 278 lb (126.1 kg)    Labs: Lab Results  Component Value Date   NA 136 04/12/2016   K 3.9 04/12/2016   CL 103 04/12/2016   CO2 26 04/12/2016   GLUCOSE 124 (H) 04/12/2016   BUN 12 04/12/2016   CREATININE 0.62 04/12/2016   CALCIUM 8.5 (L) 04/12/2016   Lab Results  Component Value Date   INR 1.97 (H) 01/19/2011   Lab Results  Component Value Date   CHOL  02/07/2009    136        ATP III CLASSIFICATION:  <200     mg/dL   Desirable  200-239  mg/dL   Borderline High  >=240    mg/dL   High          HDL 45 02/07/2009   LDLCALC  02/07/2009    74        Total Cholesterol/HDL:CHD Risk Coronary Heart Disease Risk Table                     Men   Women  1/2 Average Risk   3.4   3.3  Average Risk       5.0   4.4  2 X Average Risk   9.6   7.1  3 X Average Risk  23.4   11.0        Use the calculated Patient Ratio above and the CHD Risk Table to determine the patient's  CHD Risk.        ATP III CLASSIFICATION (LDL):  <100     mg/dL   Optimal  100-129  mg/dL   Near or Above                    Optimal  130-159  mg/dL   Borderline  160-189  mg/dL   High  >190     mg/dL   Very High   TRIG 84 02/07/2009     GEN- The patient is well appearing, alert and oriented x 3 today.   Head- normocephalic, atraumatic Eyes-  Sclera clear, conjunctiva pink Ears- hearing intact Oropharynx- clear Neck- supple, no JVP Lymph- no cervical lymphadenopathy Lungs- Clear to ausculation bilaterally, normal work of breathing Heart-irregular rate and rhythm, no murmurs, rubs or gallops, PMI not laterally displaced GI- soft, NT, ND, + BS Extremities- no clubbing, cyanosis, or edema MS- no significant deformity or atrophy Skin- no rash or lesion Psych- euthymic mood, full affect Neuro- strength and sensation are intact  EKG-afib at 123 bpm, qrs int 102 ms, qtc 448 ms Echo-Study Conclusions  - Left ventricle: The cavity size was normal. Systolic function was   normal. The estimated ejection fraction was  in the range of 55%   to 60%. Wall motion was normal; there were no regional wall   motion abnormalities. Left ventricular diastolic function   parameters were normal. - Mitral valve: Calcified annulus. - Left atrium: The atrium was mildly dilated. - Right atrium: The atrium was mildly dilated.    Assessment and Plan: 1. New onset afib General education re afib discussed with possible triggers Will continue current dose of metoprolol at 25 mg bid   Reasonable rate control, slower at home per BP monitor less than 100 Will  not update echo as she just had one last August   2. Chadsvasc score of at least 5 No missed doses Bleeding precautions discussed Will now schedule cardioversion  Risk vrs benefit of procedure discussed  She will be seen one week following cardioversion    Butch Penny C. Carroll, [facility] [address] [place], [place] [address] [phone]

## 2018-05-07 ENCOUNTER — Ambulatory Visit (HOSPITAL_COMMUNITY): Payer: Medicare Other | Admitting: Certified Registered Nurse Anesthetist

## 2018-05-07 ENCOUNTER — Encounter (HOSPITAL_COMMUNITY): Payer: Self-pay

## 2018-05-07 ENCOUNTER — Other Ambulatory Visit: Payer: Self-pay

## 2018-05-07 ENCOUNTER — Encounter (HOSPITAL_COMMUNITY)
Admission: RE | Disposition: A | Discharge status: Home / Self Care | Payer: Self-pay | Source: Ambulatory Visit | Attending: Cardiovascular Disease

## 2018-05-07 ENCOUNTER — Ambulatory Visit (HOSPITAL_COMMUNITY)
Admission: RE | Admit: 2018-05-07 | Discharge: 2018-05-07 | Disposition: A | Discharge status: Home / Self Care | Payer: Medicare Other | Source: Ambulatory Visit | Attending: Cardiovascular Disease | Admitting: Cardiovascular Disease

## 2018-05-07 DIAGNOSIS — G4730 Sleep apnea, unspecified: Secondary | ICD-10-CM | POA: Insufficient documentation

## 2018-05-07 DIAGNOSIS — M8580 Other specified disorders of bone density and structure, unspecified site: Secondary | ICD-10-CM | POA: Diagnosis not present

## 2018-05-07 DIAGNOSIS — Z888 Allergy status to other drugs, medicaments and biological substances status: Secondary | ICD-10-CM | POA: Diagnosis not present

## 2018-05-07 DIAGNOSIS — Z79899 Other long term (current) drug therapy: Secondary | ICD-10-CM | POA: Diagnosis not present

## 2018-05-07 DIAGNOSIS — Z887 Allergy status to serum and vaccine status: Secondary | ICD-10-CM | POA: Insufficient documentation

## 2018-05-07 DIAGNOSIS — Z96653 Presence of artificial knee joint, bilateral: Secondary | ICD-10-CM | POA: Diagnosis not present

## 2018-05-07 DIAGNOSIS — E785 Hyperlipidemia, unspecified: Secondary | ICD-10-CM | POA: Diagnosis not present

## 2018-05-07 DIAGNOSIS — M109 Gout, unspecified: Secondary | ICD-10-CM | POA: Insufficient documentation

## 2018-05-07 DIAGNOSIS — E1140 Type 2 diabetes mellitus with diabetic neuropathy, unspecified: Secondary | ICD-10-CM | POA: Diagnosis not present

## 2018-05-07 DIAGNOSIS — Z8249 Family history of ischemic heart disease and other diseases of the circulatory system: Secondary | ICD-10-CM | POA: Diagnosis not present

## 2018-05-07 DIAGNOSIS — M1990 Unspecified osteoarthritis, unspecified site: Secondary | ICD-10-CM | POA: Diagnosis not present

## 2018-05-07 DIAGNOSIS — Z86711 Personal history of pulmonary embolism: Secondary | ICD-10-CM | POA: Insufficient documentation

## 2018-05-07 DIAGNOSIS — K219 Gastro-esophageal reflux disease without esophagitis: Secondary | ICD-10-CM | POA: Diagnosis not present

## 2018-05-07 DIAGNOSIS — Z86718 Personal history of other venous thrombosis and embolism: Secondary | ICD-10-CM | POA: Insufficient documentation

## 2018-05-07 DIAGNOSIS — E739 Lactose intolerance, unspecified: Secondary | ICD-10-CM | POA: Diagnosis not present

## 2018-05-07 DIAGNOSIS — H3530 Unspecified macular degeneration: Secondary | ICD-10-CM | POA: Insufficient documentation

## 2018-05-07 DIAGNOSIS — Z6841 Body Mass Index (BMI) 40.0 and over, adult: Secondary | ICD-10-CM | POA: Insufficient documentation

## 2018-05-07 DIAGNOSIS — I10 Essential (primary) hypertension: Secondary | ICD-10-CM | POA: Insufficient documentation

## 2018-05-07 DIAGNOSIS — I4891 Unspecified atrial fibrillation: Secondary | ICD-10-CM | POA: Insufficient documentation

## 2018-05-07 DIAGNOSIS — G43909 Migraine, unspecified, not intractable, without status migrainosus: Secondary | ICD-10-CM | POA: Diagnosis not present

## 2018-05-07 DIAGNOSIS — I480 Paroxysmal atrial fibrillation: Secondary | ICD-10-CM | POA: Diagnosis not present

## 2018-05-07 DIAGNOSIS — E669 Obesity, unspecified: Secondary | ICD-10-CM | POA: Insufficient documentation

## 2018-05-07 DIAGNOSIS — Z7901 Long term (current) use of anticoagulants: Secondary | ICD-10-CM | POA: Insufficient documentation

## 2018-05-07 DIAGNOSIS — Z885 Allergy status to narcotic agent status: Secondary | ICD-10-CM | POA: Insufficient documentation

## 2018-05-07 DIAGNOSIS — Z86010 Personal history of colonic polyps: Secondary | ICD-10-CM | POA: Insufficient documentation

## 2018-05-07 DIAGNOSIS — Z90710 Acquired absence of both cervix and uterus: Secondary | ICD-10-CM | POA: Insufficient documentation

## 2018-05-07 DIAGNOSIS — Z7984 Long term (current) use of oral hypoglycemic drugs: Secondary | ICD-10-CM | POA: Insufficient documentation

## 2018-05-07 DIAGNOSIS — Z9889 Other specified postprocedural states: Secondary | ICD-10-CM | POA: Insufficient documentation

## 2018-05-07 HISTORY — PX: CARDIOVERSION: SHX1299

## 2018-05-07 LAB — GLUCOSE, CAPILLARY: Glucose-Capillary: 104 mg/dL — ABNORMAL HIGH (ref 70–99)

## 2018-05-07 SURGERY — CARDIOVERSION
Anesthesia: General

## 2018-05-07 MED ORDER — LIDOCAINE HCL (CARDIAC) PF 100 MG/5ML IV SOSY
PREFILLED_SYRINGE | INTRAVENOUS | Status: DC | PRN
  Administered 2018-05-07: 60 mg via INTRAVENOUS

## 2018-05-07 MED ORDER — PROPOFOL 10 MG/ML IV BOLUS
INTRAVENOUS | Status: DC | PRN
  Administered 2018-05-07: 70 mg via INTRAVENOUS

## 2018-05-07 MED ORDER — SODIUM CHLORIDE 0.9 % IV SOLN
INTRAVENOUS | Status: DC
  Administered 2018-05-07: 09:00:00 via INTRAVENOUS

## 2018-05-07 NOTE — Anesthesia Preprocedure Evaluation (Addendum)
Anesthesia Evaluation  Patient identified by MRN, date of birth, ID band  Reviewed: NPO status , Patient's Chart, lab work & pertinent test results  Airway Mallampati: II  TM Distance: >3 FB     Dental   Pulmonary    breath sounds clear to auscultation       Cardiovascular hypertension,  Rhythm:Irregular Rate:Normal     Neuro/Psych    GI/Hepatic   Endo/Other  diabetes  Renal/GU      Musculoskeletal   Abdominal   Peds  Hematology   Anesthesia Other Findings   Reproductive/Obstetrics                            Anesthesia Physical Anesthesia Plan  ASA: III  Anesthesia Plan: General   Post-op Pain Management:    Induction: Intravenous  PONV Risk Score and Plan: Ondansetron  Airway Management Planned: Mask  Additional Equipment:   Intra-op Plan:   Post-operative Plan:   Informed Consent: I have reviewed the patients History and Physical, chart, labs and discussed the procedure including the risks, benefits and alternatives for the proposed anesthesia with the patient or authorized representative who has indicated his/her understanding and acceptance.     Plan Discussed with: CRNA and Anesthesiologist  Anesthesia Plan Comments:         Anesthesia Quick Evaluation

## 2018-05-07 NOTE — Discharge Instructions (Signed)
Electrical Cardioversion, Care After °This sheet gives you information about how to care for yourself after your procedure. Your health care provider may also give you more specific instructions. If you have problems or questions, contact your health care provider. °What can I expect after the procedure? °After the procedure, it is common to have: °· Some redness on the skin where the shocks were given. ° °Follow these instructions at home: °· Do not drive for 24 hours if you were given a medicine to help you relax (sedative). °· Take over-the-counter and prescription medicines only as told by your health care provider. °· Ask your health care provider how to check your pulse. Check it often. °· Rest for 48 hours after the procedure or as told by your health care provider. °· Avoid or limit your caffeine use as told by your health care provider. °Contact a health care provider if: °· You feel like your heart is beating too quickly or your pulse is not regular. °· You have a serious muscle cramp that does not go away. °Get help right away if: °· You have discomfort in your chest. °· You are dizzy or you feel faint. °· You have trouble breathing or you are short of breath. °· Your speech is slurred. °· You have trouble moving an arm or leg on one side of your body. °· Your fingers or toes turn cold or blue. °This information is not intended to replace advice given to you by your health care provider. Make sure you discuss any questions you have with your health care provider. °Document Released: 07/21/2013 Document Revised: 05/03/2016 Document Reviewed: 04/05/2016 °Elsevier Interactive Patient Education © 2018 [employer] ° °

## 2018-05-07 NOTE — CV Procedure (Signed)
[place]: On Rx Xarelto with no missed doses Anesthesia : Propofol Dr Linna Caprice  [facility] x 2 150 then 200 Joules converted from afib rate 88 to NSR rate 70  No immediate neurologic sequelae  Jenkins Rouge

## 2018-05-07 NOTE — Interval H&P Note (Signed)
History and Physical Interval Note:  05/07/2018 9:39 AM  Natasha Glass  has presented today for surgery, with the diagnosis of AFIB  The various methods of treatment have been discussed with the patient and family. After consideration of risks, benefits and other options for treatment, the patient has consented to  Procedure(s): CARDIOVERSION (N/A) as a surgical intervention .  The patient's history has been reviewed, patient examined, no change in status, stable for surgery.  I have reviewed the patient's chart and labs.  Questions were answered to the patient's satisfaction.     Jenkins Rouge

## 2018-05-07 NOTE — Transfer of Care (Signed)
Immediate Anesthesia Transfer of Care Note  Patient: Natasha Glass  Procedure(s) Performed: CARDIOVERSION (N/A )  Patient Location: Endoscopy Unit  Anesthesia Type:General  Level of Consciousness: awake, alert , oriented and patient cooperative  Airway & Oxygen Therapy: Patient Spontanous Breathing and Patient connected to nasal cannula oxygen  Post-op Assessment: Report given to RN and Post -op Vital signs reviewed and stable  Post vital signs: Reviewed and stable  Last Vitals:  Vitals Value Taken Time  BP 103/63 05/07/2018 10:33 AM  Temp 36.6 C 05/07/2018 10:33 AM  Pulse 71 05/07/2018 10:33 AM  Resp 10 05/07/2018 10:33 AM  SpO2 100 % 05/07/2018 10:33 AM    Last Pain:  Vitals:   05/07/18 1033  TempSrc: Oral  PainSc: 0-No pain         Complications: No apparent anesthesia complications

## 2018-05-08 LAB — POCT I-STAT, CHEM 8
BUN: 21 mg/dL (ref 8–23)
CALCIUM ION: 1.08 mmol/L — AB (ref 1.15–1.40)
CHLORIDE: 101 mmol/L (ref 98–111)
Creatinine, Ser: 0.5 mg/dL (ref 0.44–1.00)
GLUCOSE: 109 mg/dL — AB (ref 70–99)
HCT: 44 % (ref 36.0–46.0)
Hemoglobin: 15 g/dL (ref 12.0–15.0)
Potassium: 3.5 mmol/L (ref 3.5–5.1)
SODIUM: 139 mmol/L (ref 135–145)
TCO2: 28 mmol/L (ref 22–32)

## 2018-05-08 NOTE — Anesthesia Postprocedure Evaluation (Signed)
Anesthesia Post Note  Patient: Natasha Glass  Procedure(s) Performed: CARDIOVERSION (N/A )     Patient location during evaluation: Endoscopy Anesthesia Type: General Level of consciousness: awake and alert Pain management: pain level controlled Vital Signs Assessment: post-procedure vital signs reviewed and stable Respiratory status: spontaneous breathing, nonlabored ventilation, respiratory function stable and patient connected to nasal cannula oxygen Cardiovascular status: blood pressure returned to baseline and stable Postop Assessment: no apparent nausea or vomiting Anesthetic complications: no    Last Vitals:  Vitals:   05/07/18 1043 05/07/18 1053  BP: (!) 87/68 108/61  Pulse: 70 70  Resp: (!) 27 17  Temp:    SpO2: 94% 98%    Last Pain:  Vitals:   05/08/18 1339  TempSrc:   PainSc: 0-No pain                 Lyberti Thrush COKER

## 2018-05-14 ENCOUNTER — Ambulatory Visit (HOSPITAL_COMMUNITY)
Admission: RE | Admit: 2018-05-14 | Discharge: 2018-05-14 | Disposition: A | Discharge status: Home / Self Care | Payer: Medicare Other | Source: Ambulatory Visit | Attending: Nurse Practitioner | Admitting: Nurse Practitioner

## 2018-05-14 ENCOUNTER — Encounter (HOSPITAL_COMMUNITY): Payer: Self-pay | Admitting: Nurse Practitioner

## 2018-05-14 VITALS — BP 118/68 | HR 59 | Ht 66.0 in | Wt 273.0 lb

## 2018-05-14 DIAGNOSIS — Z79899 Other long term (current) drug therapy: Secondary | ICD-10-CM | POA: Insufficient documentation

## 2018-05-14 DIAGNOSIS — E1140 Type 2 diabetes mellitus with diabetic neuropathy, unspecified: Secondary | ICD-10-CM | POA: Diagnosis not present

## 2018-05-14 DIAGNOSIS — E785 Hyperlipidemia, unspecified: Secondary | ICD-10-CM | POA: Diagnosis not present

## 2018-05-14 DIAGNOSIS — I4891 Unspecified atrial fibrillation: Secondary | ICD-10-CM | POA: Diagnosis present

## 2018-05-14 DIAGNOSIS — E739 Lactose intolerance, unspecified: Secondary | ICD-10-CM | POA: Insufficient documentation

## 2018-05-14 DIAGNOSIS — M1990 Unspecified osteoarthritis, unspecified site: Secondary | ICD-10-CM | POA: Insufficient documentation

## 2018-05-14 DIAGNOSIS — Z86718 Personal history of other venous thrombosis and embolism: Secondary | ICD-10-CM | POA: Diagnosis not present

## 2018-05-14 DIAGNOSIS — Z7901 Long term (current) use of anticoagulants: Secondary | ICD-10-CM | POA: Diagnosis not present

## 2018-05-14 DIAGNOSIS — I10 Essential (primary) hypertension: Secondary | ICD-10-CM | POA: Insufficient documentation

## 2018-05-14 DIAGNOSIS — I481 Persistent atrial fibrillation: Secondary | ICD-10-CM

## 2018-05-14 DIAGNOSIS — I714 Abdominal aortic aneurysm, without rupture: Secondary | ICD-10-CM | POA: Insufficient documentation

## 2018-05-14 DIAGNOSIS — H3530 Unspecified macular degeneration: Secondary | ICD-10-CM | POA: Insufficient documentation

## 2018-05-14 DIAGNOSIS — I4819 Other persistent atrial fibrillation: Secondary | ICD-10-CM

## 2018-05-14 DIAGNOSIS — M109 Gout, unspecified: Secondary | ICD-10-CM | POA: Diagnosis not present

## 2018-05-14 DIAGNOSIS — K219 Gastro-esophageal reflux disease without esophagitis: Secondary | ICD-10-CM | POA: Diagnosis not present

## 2018-05-14 DIAGNOSIS — G4730 Sleep apnea, unspecified: Secondary | ICD-10-CM | POA: Diagnosis not present

## 2018-05-14 DIAGNOSIS — E669 Obesity, unspecified: Secondary | ICD-10-CM | POA: Insufficient documentation

## 2018-05-14 DIAGNOSIS — M8580 Other specified disorders of bone density and structure, unspecified site: Secondary | ICD-10-CM | POA: Diagnosis not present

## 2018-05-14 DIAGNOSIS — Z7984 Long term (current) use of oral hypoglycemic drugs: Secondary | ICD-10-CM | POA: Insufficient documentation

## 2018-05-14 DIAGNOSIS — Z86711 Personal history of pulmonary embolism: Secondary | ICD-10-CM | POA: Diagnosis not present

## 2018-05-14 NOTE — Progress Notes (Addendum)
Primary Care Physician: Josetta Huddle, MD Referring Physician: Same   Natasha Glass is a 77 y.o. 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 Cyndia Bent,  DM II, OSA with CPAP, Arthritis,DVT,Gerd, obesity, anemia, HTN,chronic LLE, that presented to PCP recently  with not feeling well  for a couple of weeks. She was found to be in afib with RVR. Her BB dose was increased and ARB stopped, she was started on xarelto with a chadsvasc score of at least 5.  On initial visit, 6/25,  Ekg showed HR reasonably controlled at 103 bpm. BP soft at 100/58. She  has complaints of fatigue/lightheadedness. She has been able to lose 36 lbs by following a low carb diet over the last several months.Last echo in 2018 with normal EF.  F/u in afib clinic, 7/5. She is tolerating afib reasonably well. We are waiting for 3 weeks of anticoagulation for attempt at cardioversion. Weight is stable. HR controlled at 99 bpm, BP still soft to attempt higher doses of rate control.Walks with a walker but is still able to get out to grocery store and get her groceries in afib. She has chronic LLE but it is stable with longstanding use of lasix.  F/u in afib clinic. She now has had 3 weeks of uninterrupted anticoagulation and will be scheduled for cardioversion. Her weight is stable, down x 3 lbs.  F/u afib clinic, 8/1, s/p cardioversion which was successful.  Remains in SR. She is feeling so much better.  Today, she denies symptoms of palpitations, chest pain, shortness of breath, orthopnea, PND, lower extremity edema, dizziness, presyncope, syncope, or neurologic sequela. + for fatigue/lightheadedness.The patient is tolerating medications without difficulties and is otherwise without complaint today.   Past Medical History:  Diagnosis Date  . Anemia   . Arthritis    "legs, hands, knees, back" (04/10/2016)  . DVT (deep venous thrombosis) ([place]) 01/2009  . Dyspnea    due to weight   . GERD  (gastroesophageal reflux disease)    occ.  . Gout   . Headache    "at least weekly" (04/10/2016)  . History of blood transfusion 1990s   "related to ulcerative colitis"  . History of colonic polyps 09/2010  . Hyperlipidemia   . Hypertension   . Lactose intolerance   . Macular degeneration, dry    bilateral  . Migraine    "q 2-3 months" (04/10/2016)  . Obesity   . Osteopenia   . Peripheral neuropathy   . Pneumonia 1970s  . PONV (postoperative nausea and vomiting)    hx of years ago none recent   . Pulmonary embolism ([place]) 01/2009  . Shortness of breath on exertion   . Sleep apnea    cpap pressure 4.0-16.0   . Type II diabetes mellitus ([place])    type II   . 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 Urinary incontinence    wears peripad   Past Surgical History:  Procedure Laterality Date  . ABDOMINAL EXPLORATION SURGERY  1960  . ABDOMINAL HYSTERECTOMY  1986  . CARDIAC CATHETERIZATION    . CARDIOVERSION N/A 05/07/2018   Procedure: CARDIOVERSION;  Surgeon: Josue Hector, MD;  Location: [facility] ENDOSCOPY;  Service: Cardiovascular;  Laterality: N/A;  . COLONOSCOPY W/ BIOPSIES AND POLYPECTOMY  2005; 2011; 2012  . COLONOSCOPY WITH PROPOFOL N/A 09/10/2016   Procedure: COLONOSCOPY WITH PROPOFOL;  Surgeon: Garlan Fair, MD;  Location: [facility] ENDOSCOPY;  Service: Endoscopy;  Laterality: N/A;  .  JOINT REPLACEMENT    . KNEE ARTHROSCOPY Left ~ 1999  . SHOULDER OPEN ROTATOR CUFF REPAIR Left 2011  . TOTAL KNEE ARTHROPLASTY Bilateral 2005-2012   left-right    Current Outpatient Medications  Medication Sig Dispense Refill  . allopurinol (ZYLOPRIM) 300 MG tablet Take 150 mg by mouth every evening.     . Cholecalciferol (VITAMIN D) 2000 UNITS tablet Take 2,000 Units by mouth every morning.     . furosemide (LASIX) 40 MG tablet Take 40 mg by mouth daily. Take extra tablet every other day if swelling    . Magnesium 250 MG TABS Take 250 mg by mouth daily.     . metFORMIN (GLUCOPHAGE) 500  MG tablet Take 500 mg by mouth 2 (two) times daily with a meal.     . metoprolol tartrate (LOPRESSOR) 25 MG tablet Take 50 mg by mouth 2 (two) times daily.     . Multiple Vitamins-Minerals (PRESERVISION AREDS 2) CAPS Take 2 capsules by mouth daily.    . Naphazoline HCl (CLEAR EYES OP) Place 2 drops into both eyes 2 (two) times daily as needed (for dry eyes).    Marland Kitchen omeprazole (PRILOSEC) 20 MG capsule Take 1 capsule (20 mg total) by mouth daily. (Patient taking differently: Take 20 mg by mouth daily as needed (for acid reflux). ) 20 capsule 0  . rivaroxaban (XARELTO) 20 MG TABS tablet Take 1 tablet (20 mg total) by mouth daily with supper. 30 tablet 0   No current facility-administered medications for this encounter.     Allergies  Allergen Reactions  . Morphine And Related Other (See Comments)    Unresponsive-Very bad reaction.  Can take hydrocodone.   [name] Allergy] Shortness Of Breath, Itching, Swelling and Rash  . Amlodipine Swelling and Other (See Comments)    Of legs  . Percocet [Oxycodone-Acetaminophen] Other (See Comments)    hallucinations  . Tdap [Tetanus-Diphth-Acell Pertussis] Itching, Swelling and Rash  . Tetanus Toxoids Itching, Swelling and Rash  . Tramadol Swelling and Other (See Comments)    Of legs  . Diovan [Valsartan] Other (See Comments)    Cough  . Diphtheria Toxoid Itching, Swelling and Rash  . Macrodantin [Nitrofurantoin Macrocrystal] Other (See Comments)    unspecified    Social History   Socioeconomic History  . Marital status: Widowed    Spouse name: Not on file  . Number of children: Not on file  . Years of education: Not on file  . Highest education level: Not on file  Occupational History  . Not on file  Social Needs  . Financial resource strain: Not on file  . Food insecurity:    Worry: Not on file    Inability: Not on file  . Transportation needs:    Medical: Not on file    Non-medical: Not on file  Tobacco Use  . Smoking  status: Never Smoker  . Smokeless tobacco: Never Used  Substance and Sexual Activity  . Alcohol use: No  . Drug use: No  . Sexual activity: Never  Lifestyle  . Physical activity:    Days per week: Not on file    Minutes per session: Not on file  . Stress: Not on file  Relationships  . Social connections:    Talks on phone: Not on file    Gets together: Not on file    Attends religious service: Not on file    Active member of club or organization: Not on file  Attends meetings of clubs or organizations: Not on file    Relationship status: Not on file  . Intimate partner violence:    Fear of current or ex partner: Not on file    Emotionally abused: Not on file    Physically abused: Not on file    Forced sexual activity: Not on file  Other Topics Concern  . Not on file  Social History Narrative  . Not on file    Family History  Problem Relation Age of Onset  . Colon cancer Mother   . Lung cancer Mother   . Heart disease Father     ROS- All systems are reviewed and negative except as per the HPI above  Physical Exam: Vitals:   05/14/18 0934  BP: 118/68  Pulse: (!) 59  Weight: 273 lb (123.8 kg)  Height: 5' 6"  (1.676 m)   Wt Readings from Last 3 Encounters:  05/14/18 273 lb (123.8 kg)  05/07/18 274 lb (124.3 kg)  04/29/18 274 lb 3.2 oz (124.4 kg)    Labs: Lab Results  Component Value Date   NA 139 05/07/2018   K 3.5 05/07/2018   CL 101 05/07/2018   CO2 28 04/29/2018   GLUCOSE 109 (H) 05/07/2018   BUN 21 05/07/2018   CREATININE 0.50 05/07/2018   CALCIUM 9.6 04/29/2018   Lab Results  Component Value Date   INR 1.97 (H) 01/19/2011   Lab Results  Component Value Date   CHOL  02/07/2009    136        ATP III CLASSIFICATION:  <200     mg/dL   Desirable  200-239  mg/dL   Borderline High  >=240    mg/dL   High          HDL 45 02/07/2009   LDLCALC  02/07/2009    74        Total Cholesterol/HDL:CHD Risk Coronary Heart Disease Risk Table                      Men   Women  1/2 Average Risk   3.4   3.3  Average Risk       5.0   4.4  2 X Average Risk   9.6   7.1  3 X Average Risk  23.4   11.0        Use the calculated Patient Ratio above and the CHD Risk Table to determine the patient's CHD Risk.        ATP III CLASSIFICATION (LDL):  <100     mg/dL   Optimal  100-129  mg/dL   Near or Above                    Optimal  130-159  mg/dL   Borderline  160-189  mg/dL   High  >190     mg/dL   Very High   TRIG 84 02/07/2009     GEN- The patient is well appearing, alert and oriented x 3 today.   Head- normocephalic, atraumatic Eyes-  Sclera clear, conjunctiva pink Ears- hearing intact Oropharynx- clear Neck- supple, no JVP Lymph- no cervical lymphadenopathy Lungs- Clear to ausculation bilaterally, normal work of breathing Heart-irregular rate and rhythm, no murmurs, rubs or gallops, PMI not laterally displaced GI- soft, NT, ND, + BS Extremities- no clubbing, cyanosis, or edema MS- no significant deformity or atrophy Skin- no rash or lesion Psych- euthymic mood, full affect Neuro- strength and sensation are  intact  EKG-afib at 123 bpm, qrs int 102 ms, qtc 448 ms Echo-Study Conclusions  - Left ventricle: The cavity size was normal. Systolic function was   normal. The estimated ejection fraction was in the range of 55%   to 60%. Wall motion was normal; there were no regional wall   motion abnormalities. Left ventricular diastolic function   parameters were normal. - Mitral valve: Calcified annulus. - Left atrium: The atrium was mildly dilated. - Right atrium: The atrium was mildly dilated.    Assessment and Plan: 1. New onset afib S/p successful cardioversion with improvement  in energy and shortness of breath  Continue current dose of metoprolol at 25 mg bid    2. Chadsvasc score of at least 5 No missed doses, reminded not to interrupt x one month, after that pt wants to return to coumadin clinic at PCP office to  transition over to coumadin for cost issues, she had been on coumadin in the past   F/u in in 3 months or sooner if pt notices any symptoms associated with  return of afib     Butch Penny C. Karrissa Parchment, [facility] [address] [place], [place] [address] [phone]

## 2018-06-04 ENCOUNTER — Other Ambulatory Visit: Payer: Self-pay | Admitting: *Deleted

## 2018-06-04 DIAGNOSIS — I712 Thoracic aortic aneurysm, without rupture, unspecified: Secondary | ICD-10-CM

## 2018-06-04 DIAGNOSIS — I7121 Aneurysm of the ascending aorta, without rupture: Secondary | ICD-10-CM

## 2018-07-08 ENCOUNTER — Ambulatory Visit: Payer: Medicare Other | Admitting: Surgery

## 2018-07-08 ENCOUNTER — Other Ambulatory Visit: Payer: Medicare Other

## 2018-07-16 ENCOUNTER — Ambulatory Visit: Payer: Medicare Other | Admitting: Surgery

## 2018-07-16 ENCOUNTER — Ambulatory Visit
Admission: RE | Admit: 2018-07-16 | Discharge: 2018-07-16 | Disposition: A | Discharge status: Home / Self Care | Payer: Medicare Other | Source: Ambulatory Visit | Attending: Surgery | Admitting: Surgery

## 2018-07-16 VITALS — BP 124/80 | HR 100 | Resp 20 | Ht 66.0 in | Wt 267.0 lb

## 2018-07-16 DIAGNOSIS — I712 Thoracic aortic aneurysm, without rupture, unspecified: Secondary | ICD-10-CM

## 2018-07-16 DIAGNOSIS — I7121 Aneurysm of the ascending aorta, without rupture: Secondary | ICD-10-CM

## 2018-07-16 MED ORDER — IOPAMIDOL (ISOVUE-370) INJECTION 76%
75.0000 mL | Freq: Once | INTRAVENOUS | Status: AC | PRN
  Administered 2018-07-16: 75 mL via INTRAVENOUS

## 2018-07-17 ENCOUNTER — Encounter: Payer: Self-pay | Admitting: Surgery

## 2018-07-17 NOTE — Progress Notes (Signed)
HPI:  The patient returns today for follow-up of a 4.5 cm fusiform ascending aortic aneurysm.  She says that she was recently diagnosed with atrial fibrillation after she presented with marked fatigue.  She underwent cardioversion by Dr. Johnsie Cancel on 05/07/2018.  She was treated with Xarelto for 60 days and then converted to Coumadin due to the high cost of Xarelto.  She says that she has been feeling okay.  She has had chronic edema in both legs.  She has been watching her diet and is lost about 45 pounds.  She still has difficulty with ambulation due to severe degenerative arthritis of her knees and morbid obesity.  She uses a walker.  Current Outpatient Medications  Medication Sig Dispense Refill  . allopurinol (ZYLOPRIM) 300 MG tablet Take 150 mg by mouth every evening.     . Cholecalciferol (VITAMIN D) 2000 UNITS tablet Take 2,000 Units by mouth every morning.     . furosemide (LASIX) 40 MG tablet Take 40 mg by mouth daily. Take extra tablet every other day if swelling    . Magnesium 250 MG TABS Take 250 mg by mouth daily.     . metFORMIN (GLUCOPHAGE) 500 MG tablet Take 500 mg by mouth 2 (two) times daily with a meal.     . metoprolol tartrate (LOPRESSOR) 25 MG tablet Take 50 mg by mouth 2 (two) times daily.     . Multiple Vitamins-Minerals (PRESERVISION AREDS 2) CAPS Take 2 capsules by mouth daily.    . Naphazoline HCl (CLEAR EYES OP) Place 2 drops into both eyes 2 (two) times daily as needed (for dry eyes).    Marland Kitchen omeprazole (PRILOSEC) 20 MG capsule Take 1 capsule (20 mg total) by mouth daily. (Patient taking differently: Take 20 mg by mouth daily as needed (for acid reflux). ) 20 capsule 0  . warfarin (COUMADIN) 5 MG tablet Take 5 mg by mouth daily. Take as directed     No current facility-administered medications for this visit.      Physical Exam: BP 124/80   Pulse 100   Resp 20   Ht 5' 6"  (1.676 m)   Wt 267 lb (121.1 kg)   SpO2 95% Comment: RA  BMI 43.09 kg/m  She looks  well. Cardiac exam shows an irregular rate and rhythm.  There is no murmur. Lungs are clear. There is marked bilateral lower extremity edema which is a chronic problem for her.  Diagnostic Tests:  CLINICAL DATA:  Ascending thoracic aortic aneurysm  EXAM: CT ANGIOGRAPHY CHEST WITH CONTRAST  TECHNIQUE: Multidetector CT imaging of the chest was performed using the standard protocol during bolus administration of intravenous contrast. Multiplanar CT image reconstructions and MIPs were obtained to evaluate the vascular anatomy.  CONTRAST:  55m ISOVUE-370 IOPAMIDOL (ISOVUE-370) INJECTION 76%  COMPARISON:  06/04/2017  FINDINGS: Cardiovascular: Stable fusiform aneurysmal dilatation of the ascending thoracic aorta, maximal diameter 4.5 cm, previously 4.4 cm. No associated wall thickening, intramural hemorrhage, dissection, or occlusive process. Three-vessel arch anatomy appearing patent.  Central pulmonary arteries are patent. No significant pulmonary embolus. Stable heart size. No pericardial effusion.  Mediastinum/Nodes: No enlarged mediastinal, hilar, or axillary lymph nodes. Thyroid gland, trachea, and esophagus demonstrate no significant findings.  Lungs/Pleura: Calcified granuloma in the posterior left upper lobe as before. Basilar and lingula atelectasis noted. No focal pneumonia, collapse or consolidation. Negative for edema or interstitial disease. No pleural abnormality, effusion or pneumothorax.  Upper Abdomen: Cholelithiasis noted. Slight nodularity of the liver suggesting  early cirrhosis. No acute upper abdominal finding.  Musculoskeletal: Degenerative changes noted spine. No acute osseous finding. No compression fracture. Sternum intact.  Review of the MIP images confirms the above findings.  IMPRESSION: Stable ascending thoracic aortic aneurysm measuring 4.5 cm, previously 4.4 cm.  Recommend annual imaging followup by CTA or MRA. This  recommendation follows 2010 ACCF/AHA/AATS/ACR/ASA/SCA/SCAI/SIR/STS/SVM Guidelines for the Diagnosis and Management of Patients with Thoracic Aortic Disease. Circulation.2010; 121: Y403-K742  No other acute intrathoracic finding.  Cholelithiasis  Nodular liver suggesting early cirrhosis.  Aortic Atherosclerosis (ICD10-I70.0).   Electronically Signed   By: Jerilynn Mages.  Shick M.D.   On: 07/16/2018 15:33   Impression:  This 77 year old woman has a stable 4.5 cm fusiform ascending aortic aneurysm.  I reviewed the CT images with her and answered her questions.  Her blood pressure is under good control when she is on a beta-blocker.  She appears to be back in atrial fibrillation today with a controlled rate.  She is anticoagulated with Coumadin.  She is going to contact Roderic Palau, NP in the atrial fibrillation clinic to discuss that with her.  She will require a follow-up CTA of the chest to reevaluate the ascending aortic aneurysm in 1 year.  Plan:  She will follow-up with me in 1 year with a CTA of the chest.  She will contact Roderic Palau, NP in the atrial fibrillation clinic concerning recurrence of her rate-controlled atrial fibrillation.   I spent 15 minutes performing this established patient evaluation and > 50% of this time was spent face to face counseling and coordinating the care of this patient's aortic aneurysm.    Gaye Pollack, MD [facility] [phone]

## 2018-07-21 ENCOUNTER — Ambulatory Visit (HOSPITAL_COMMUNITY)
Admission: RE | Admit: 2018-07-21 | Discharge: 2018-07-21 | Disposition: A | Discharge status: Home / Self Care | Payer: Medicare Other | Source: Ambulatory Visit | Attending: Nurse Practitioner | Admitting: Nurse Practitioner

## 2018-07-21 ENCOUNTER — Telehealth: Payer: Self-pay | Admitting: Pharmacist

## 2018-07-21 ENCOUNTER — Other Ambulatory Visit (HOSPITAL_COMMUNITY): Payer: Self-pay | Admitting: *Deleted

## 2018-07-21 ENCOUNTER — Encounter (HOSPITAL_COMMUNITY): Payer: Self-pay | Admitting: Nurse Practitioner

## 2018-07-21 VITALS — BP 118/82 | HR 116 | Ht 66.0 in | Wt 268.0 lb

## 2018-07-21 DIAGNOSIS — Z885 Allergy status to narcotic agent status: Secondary | ICD-10-CM | POA: Diagnosis not present

## 2018-07-21 DIAGNOSIS — M8580 Other specified disorders of bone density and structure, unspecified site: Secondary | ICD-10-CM | POA: Insufficient documentation

## 2018-07-21 DIAGNOSIS — I4811 Longstanding persistent atrial fibrillation: Secondary | ICD-10-CM | POA: Diagnosis not present

## 2018-07-21 DIAGNOSIS — Z86711 Personal history of pulmonary embolism: Secondary | ICD-10-CM | POA: Diagnosis not present

## 2018-07-21 DIAGNOSIS — Z7901 Long term (current) use of anticoagulants: Secondary | ICD-10-CM | POA: Insufficient documentation

## 2018-07-21 DIAGNOSIS — I10 Essential (primary) hypertension: Secondary | ICD-10-CM | POA: Diagnosis not present

## 2018-07-21 DIAGNOSIS — Z96653 Presence of artificial knee joint, bilateral: Secondary | ICD-10-CM | POA: Insufficient documentation

## 2018-07-21 DIAGNOSIS — M109 Gout, unspecified: Secondary | ICD-10-CM | POA: Diagnosis not present

## 2018-07-21 DIAGNOSIS — K5190 Ulcerative colitis, unspecified, without complications: Secondary | ICD-10-CM | POA: Diagnosis not present

## 2018-07-21 DIAGNOSIS — I480 Paroxysmal atrial fibrillation: Secondary | ICD-10-CM

## 2018-07-21 DIAGNOSIS — M1990 Unspecified osteoarthritis, unspecified site: Secondary | ICD-10-CM | POA: Insufficient documentation

## 2018-07-21 DIAGNOSIS — K219 Gastro-esophageal reflux disease without esophagitis: Secondary | ICD-10-CM | POA: Diagnosis not present

## 2018-07-21 DIAGNOSIS — Z888 Allergy status to other drugs, medicaments and biological substances status: Secondary | ICD-10-CM | POA: Insufficient documentation

## 2018-07-21 DIAGNOSIS — Z8249 Family history of ischemic heart disease and other diseases of the circulatory system: Secondary | ICD-10-CM | POA: Insufficient documentation

## 2018-07-21 DIAGNOSIS — G4733 Obstructive sleep apnea (adult) (pediatric): Secondary | ICD-10-CM | POA: Insufficient documentation

## 2018-07-21 DIAGNOSIS — Z7984 Long term (current) use of oral hypoglycemic drugs: Secondary | ICD-10-CM | POA: Insufficient documentation

## 2018-07-21 DIAGNOSIS — D649 Anemia, unspecified: Secondary | ICD-10-CM | POA: Diagnosis not present

## 2018-07-21 DIAGNOSIS — Z86718 Personal history of other venous thrombosis and embolism: Secondary | ICD-10-CM | POA: Diagnosis not present

## 2018-07-21 DIAGNOSIS — Z79899 Other long term (current) drug therapy: Secondary | ICD-10-CM | POA: Diagnosis not present

## 2018-07-21 DIAGNOSIS — I4891 Unspecified atrial fibrillation: Secondary | ICD-10-CM | POA: Diagnosis present

## 2018-07-21 DIAGNOSIS — R9431 Abnormal electrocardiogram [ECG] [EKG]: Secondary | ICD-10-CM | POA: Insufficient documentation

## 2018-07-21 DIAGNOSIS — I4819 Other persistent atrial fibrillation: Secondary | ICD-10-CM

## 2018-07-21 DIAGNOSIS — E785 Hyperlipidemia, unspecified: Secondary | ICD-10-CM | POA: Insufficient documentation

## 2018-07-21 DIAGNOSIS — E1140 Type 2 diabetes mellitus with diabetic neuropathy, unspecified: Secondary | ICD-10-CM | POA: Diagnosis not present

## 2018-07-21 DIAGNOSIS — E669 Obesity, unspecified: Secondary | ICD-10-CM | POA: Insufficient documentation

## 2018-07-21 DIAGNOSIS — I712 Thoracic aortic aneurysm, without rupture: Secondary | ICD-10-CM | POA: Insufficient documentation

## 2018-07-21 LAB — BASIC METABOLIC PANEL
ANION GAP: 11 (ref 5–15)
BUN: 15 mg/dL (ref 8–23)
CALCIUM: 9.6 mg/dL (ref 8.9–10.3)
CO2: 28 mmol/L (ref 22–32)
Chloride: 102 mmol/L (ref 98–111)
Creatinine, Ser: 0.62 mg/dL (ref 0.44–1.00)
GFR calc Af Amer: >60 mL/min (ref >60)
Glucose, Bld: 108 mg/dL — ABNORMAL HIGH (ref 70–99)
POTASSIUM: 3.4 mmol/L — AB (ref 3.5–5.1)
SODIUM: 141 mmol/L (ref 135–145)

## 2018-07-21 LAB — MAGNESIUM: MAGNESIUM: 1.7 mg/dL (ref 1.7–2.4)

## 2018-07-21 MED ORDER — POTASSIUM CHLORIDE CRYS ER 20 MEQ PO TBCR: 20.0000 meq | EXTENDED_RELEASE_TABLET | Freq: Every day | ORAL | 3 refills | Status: DC

## 2018-07-21 NOTE — Telephone Encounter (Signed)
Medication list reviewed in anticipation of upcoming Tikosyn initiation. Patient is not taking any contraindicated or QTc prolonging medications.   Patient is anticoagulated on warfarin and will require 4 weekly therapeutic INRs prior to Tikosyn start. PCP Dr Inda Merlin is managing warfarin and will coordinate this.  Pt will need K supplementation as K has been < 4 consistently, Mg also slightly low at 1.7.  Patient will need to be counseled to avoid use of Benadryl while on Tikosyn and in the 2-3 days prior to Tikosyn initiation.

## 2018-07-21 NOTE — Progress Notes (Signed)
Primary Care Physician: Josetta Huddle, MD Referring Physician: Same   Signora Zucco Sangha is a 77 y.o. 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 Cyndia Bent,  DM II, OSA with CPAP, Arthritis,DVT,Gerd, obesity, anemia, HTN,chronic LLE, that presented to PCP recently  with not feeling well  for a couple of weeks. She was found to be in afib with RVR. Her BB dose was increased and ARB stopped, she was started on xarelto with a chadsvasc score of at least 5.  On initial visit, 6/25,  Ekg showed HR reasonably controlled at 103 bpm. BP soft at 100/58. She  has complaints of fatigue/lightheadedness. She has been able to lose 36 lbs by following a low carb diet over the last several months.Last echo in 2018 with normal EF.  F/u in afib clinic, 7/5. She is tolerating afib reasonably well. We are waiting for 3 weeks of anticoagulation for attempt at cardioversion. Weight is stable. HR controlled at 99 bpm, BP still soft to attempt higher doses of rate control.Walks with a walker but is still able to get out to grocery store and get her groceries in afib. She has chronic LLE but it is stable with longstanding use of lasix.  F/u in afib clinic. She now has had 3 weeks of uninterrupted anticoagulation and will be scheduled for cardioversion. Her weight is stable, down x 3 lbs.  F/u afib clinic, 8/1, s/p cardioversion which was successful.  Remains in SR. She is feeling so much better. She is able to walk and do house duties without walker when in [place]. She is happy for this as she wants to stay in her own home/independent as long as possible.  F/u in afib clinic, 10/8. She saw Dr. Cyndia Bent last week and he noted irregular pulse. Pt had noted that she was was feeling weaker for the last couple of weeks and had to use her walker again which interferes with her autonomy. She is interested in restoring SR with antiarrythmic's.  Today, she denies symptoms of palpitations, chest pain, shortness  of breath, orthopnea, PND, lower extremity edema, dizziness, presyncope, syncope, or neurologic sequela. + for fatigue/lightheadedness.The patient is tolerating medications without difficulties and is otherwise without complaint today.   Past Medical History:  Diagnosis Date  . Anemia   . Arthritis    "legs, hands, knees, back" (04/10/2016)  . DVT (deep venous thrombosis) ([place]) 01/2009  . Dyspnea    due to weight   . GERD (gastroesophageal reflux disease)    occ.  . Gout   . Headache    "at least weekly" (04/10/2016)  . History of blood transfusion 1990s   "related to ulcerative colitis"  . History of colonic polyps 09/2010  . Hyperlipidemia   . Hypertension   . Lactose intolerance   . Macular degeneration, dry    bilateral  . Migraine    "q 2-3 months" (04/10/2016)  . Obesity   . Osteopenia   . Peripheral neuropathy   . Pneumonia 1970s  . PONV (postoperative nausea and vomiting)    hx of years ago none recent   . Pulmonary embolism ([place]) 01/2009  . Shortness of breath on exertion   . Sleep apnea    cpap pressure 4.0-16.0   . Type II diabetes mellitus ([place])    type II   . 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 Urinary incontinence    wears peripad   Past Surgical History:  Procedure Laterality Date  .  ABDOMINAL EXPLORATION SURGERY  1960  . ABDOMINAL HYSTERECTOMY  1986  . CARDIAC CATHETERIZATION    . CARDIOVERSION N/A 05/07/2018   Procedure: CARDIOVERSION;  Surgeon: Josue Hector, MD;  Location: [facility] ENDOSCOPY;  Service: Cardiovascular;  Laterality: N/A;  . COLONOSCOPY W/ BIOPSIES AND POLYPECTOMY  2005; 2011; 2012  . COLONOSCOPY WITH PROPOFOL N/A 09/10/2016   Procedure: COLONOSCOPY WITH PROPOFOL;  Surgeon: Garlan Fair, MD;  Location: [facility] ENDOSCOPY;  Service: Endoscopy;  Laterality: N/A;  . JOINT REPLACEMENT    . KNEE ARTHROSCOPY Left ~ 1999  . SHOULDER OPEN ROTATOR CUFF REPAIR Left 2011  . TOTAL KNEE ARTHROPLASTY Bilateral 2005-2012   left-right     Current Outpatient Medications  Medication Sig Dispense Refill  . allopurinol (ZYLOPRIM) 300 MG tablet Take 150 mg by mouth every evening.     . Cholecalciferol (VITAMIN D) 2000 UNITS tablet Take 2,000 Units by mouth every morning.     . furosemide (LASIX) 40 MG tablet Take 40 mg by mouth daily. Take extra tablet every other day if swelling    . Magnesium 250 MG TABS Take 250 mg by mouth daily.     . metFORMIN (GLUCOPHAGE) 500 MG tablet Take 500 mg by mouth 2 (two) times daily with a meal.     . metoprolol tartrate (LOPRESSOR) 25 MG tablet Take 50 mg by mouth 2 (two) times daily.     . Multiple Vitamins-Minerals (PRESERVISION AREDS 2) CAPS Take 2 capsules by mouth daily.    . Naphazoline HCl (CLEAR EYES OP) Place 2 drops into both eyes 2 (two) times daily as needed (for dry eyes).    Marland Kitchen omeprazole (PRILOSEC) 20 MG capsule Take 1 capsule (20 mg total) by mouth daily. (Patient taking differently: Take 20 mg by mouth daily as needed (for acid reflux). ) 20 capsule 0  . warfarin (COUMADIN) 5 MG tablet Take 5 mg by mouth daily. Take as directed     No current facility-administered medications for this encounter.     Allergies  Allergen Reactions  . Morphine And Related Other (See Comments)    Unresponsive-Very bad reaction.  Can take hydrocodone.   [name] Allergy] Shortness Of Breath, Itching, Swelling and Rash  . Amlodipine Swelling and Other (See Comments)    Of legs  . Percocet [Oxycodone-Acetaminophen] Other (See Comments)    hallucinations  . Tdap [Tetanus-Diphth-Acell Pertussis] Itching, Swelling and Rash  . Tetanus Toxoids Itching, Swelling and Rash  . Tramadol Swelling and Other (See Comments)    Of legs  . Diovan [Valsartan] Other (See Comments)    Cough  . Diphtheria Toxoid Itching, Swelling and Rash  . Macrodantin [Nitrofurantoin Macrocrystal] Other (See Comments)    unspecified    Social History   Socioeconomic History  . Marital status: Widowed     Spouse name: Not on file  . Number of children: Not on file  . Years of education: Not on file  . Highest education level: Not on file  Occupational History  . Not on file  Social Needs  . Financial resource strain: Not on file  . Food insecurity:    Worry: Not on file    Inability: Not on file  . Transportation needs:    Medical: Not on file    Non-medical: Not on file  Tobacco Use  . Smoking status: Never Smoker  . Smokeless tobacco: Never Used  Substance and Sexual Activity  . Alcohol use: No  . Drug use: No  .  Sexual activity: Never  Lifestyle  . Physical activity:    Days per week: Not on file    Minutes per session: Not on file  . Stress: Not on file  Relationships  . Social connections:    Talks on phone: Not on file    Gets together: Not on file    Attends religious service: Not on file    Active member of club or organization: Not on file    Attends meetings of clubs or organizations: Not on file    Relationship status: Not on file  . Intimate partner violence:    Fear of current or ex partner: Not on file    Emotionally abused: Not on file    Physically abused: Not on file    Forced sexual activity: Not on file  Other Topics Concern  . Not on file  Social History Narrative  . Not on file    Family History  Problem Relation Age of Onset  . Colon cancer Mother   . Lung cancer Mother   . Heart disease Father     ROS- All systems are reviewed and negative except as per the HPI above  Physical Exam: Vitals:   07/21/18 0832  BP: 118/82  Pulse: (!) 116  Weight: 121.6 kg  Height: 5' 6"  (1.676 m)   Wt Readings from Last 3 Encounters:  07/21/18 121.6 kg  07/16/18 121.1 kg  05/14/18 123.8 kg    Labs: Lab Results  Component Value Date   NA 139 05/07/2018   K 3.5 05/07/2018   CL 101 05/07/2018   CO2 28 04/29/2018   GLUCOSE 109 (H) 05/07/2018   BUN 21 05/07/2018   CREATININE 0.50 05/07/2018   CALCIUM 9.6 04/29/2018   Lab Results  Component  Value Date   INR 1.97 (H) 01/19/2011   Lab Results  Component Value Date   CHOL  02/07/2009    136        ATP III CLASSIFICATION:  <200     mg/dL   Desirable  200-239  mg/dL   Borderline High  >=240    mg/dL   High          HDL 45 02/07/2009   LDLCALC  02/07/2009    74        Total Cholesterol/HDL:CHD Risk Coronary Heart Disease Risk Table                     Men   Women  1/2 Average Risk   3.4   3.3  Average Risk       5.0   4.4  2 X Average Risk   9.6   7.1  3 X Average Risk  23.4   11.0        Use the calculated Patient Ratio above and the CHD Risk Table to determine the patient's CHD Risk.        ATP III CLASSIFICATION (LDL):  <100     mg/dL   Optimal  100-129  mg/dL   Near or Above                    Optimal  130-159  mg/dL   Borderline  160-189  mg/dL   High  >190     mg/dL   Very High   TRIG 84 02/07/2009     GEN- The patient is well appearing, alert and oriented x 3 today.   Head- normocephalic, atraumatic Eyes-  Sclera clear,  conjunctiva pink Ears- hearing intact Oropharynx- clear Neck- supple, no JVP Lymph- no cervical lymphadenopathy Lungs- Clear to ausculation bilaterally, normal work of breathing Heart-irregular rate and rhythm, no murmurs, rubs or gallops, PMI not laterally displaced GI- soft, NT, ND, + BS Extremities- no clubbing, cyanosis, or edema MS- no significant deformity or atrophy Skin- no rash or lesion Psych- euthymic mood, full affect Neuro- strength and sensation are intact  EKG-afib at 116 bpm, qrs int 102 ms, qtc 448 ms Echo-Study Conclusions  - Left ventricle: The cavity size was normal. Systolic function was   normal. The estimated ejection fraction was in the range of 55%   to 60%. Wall motion was normal; there were no regional wall   motion abnormalities. Left ventricular diastolic function   parameters were normal. - Mitral valve: Calcified annulus. - Left atrium: The atrium was mildly dilated. - Right atrium: The  atrium was mildly dilated.    Assessment and Plan:  1. 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 Chadsvasc score of at least 5 Continue coumadin   F/u in in 3 weeks or so, with receiving INR's from PCP, for scheduling for hospitalization for [place]. Briona Korpela, [facility] [address] [place], [place] [address] [phone]

## 2018-08-14 DIAGNOSIS — I4891 Unspecified atrial fibrillation: Secondary | ICD-10-CM

## 2018-08-14 HISTORY — DX: Unspecified atrial fibrillation: I48.91

## 2018-08-18 ENCOUNTER — Ambulatory Visit (HOSPITAL_COMMUNITY)
Admission: RE | Admit: 2018-08-18 | Discharge: 2018-08-18 | Disposition: A | Discharge status: Home / Self Care | Payer: Medicare Other | Source: Ambulatory Visit | Attending: Nurse Practitioner | Admitting: Nurse Practitioner

## 2018-08-18 ENCOUNTER — Encounter (HOSPITAL_COMMUNITY): Payer: Self-pay | Admitting: Nurse Practitioner

## 2018-08-18 ENCOUNTER — Inpatient Hospital Stay (HOSPITAL_COMMUNITY)
Admission: AD | Admit: 2018-08-18 | Discharge: 2018-08-21 | DRG: 310 | Disposition: A | Discharge status: Home / Self Care | Payer: Medicare Other | Source: Ambulatory Visit | Attending: Internal Medicine | Admitting: Internal Medicine

## 2018-08-18 ENCOUNTER — Other Ambulatory Visit: Payer: Self-pay

## 2018-08-18 ENCOUNTER — Encounter (HOSPITAL_COMMUNITY): Payer: Self-pay | Admitting: General Practice

## 2018-08-18 VITALS — BP 116/82 | HR 103 | Ht 66.0 in | Wt 270.2 lb

## 2018-08-18 DIAGNOSIS — I712 Thoracic aortic aneurysm, without rupture: Secondary | ICD-10-CM | POA: Diagnosis present

## 2018-08-18 DIAGNOSIS — Z888 Allergy status to other drugs, medicaments and biological substances status: Secondary | ICD-10-CM

## 2018-08-18 DIAGNOSIS — D649 Anemia, unspecified: Secondary | ICD-10-CM | POA: Diagnosis present

## 2018-08-18 DIAGNOSIS — Z7901 Long term (current) use of anticoagulants: Secondary | ICD-10-CM

## 2018-08-18 DIAGNOSIS — G4733 Obstructive sleep apnea (adult) (pediatric): Secondary | ICD-10-CM | POA: Diagnosis present

## 2018-08-18 DIAGNOSIS — Z887 Allergy status to serum and vaccine status: Secondary | ICD-10-CM

## 2018-08-18 DIAGNOSIS — Z885 Allergy status to narcotic agent status: Secondary | ICD-10-CM

## 2018-08-18 DIAGNOSIS — Z96653 Presence of artificial knee joint, bilateral: Secondary | ICD-10-CM | POA: Diagnosis present

## 2018-08-18 DIAGNOSIS — Z8719 Personal history of other diseases of the digestive system: Secondary | ICD-10-CM

## 2018-08-18 DIAGNOSIS — R32 Unspecified urinary incontinence: Secondary | ICD-10-CM | POA: Diagnosis present

## 2018-08-18 DIAGNOSIS — E669 Obesity, unspecified: Secondary | ICD-10-CM | POA: Diagnosis present

## 2018-08-18 DIAGNOSIS — I4819 Other persistent atrial fibrillation: Secondary | ICD-10-CM | POA: Diagnosis present

## 2018-08-18 DIAGNOSIS — E785 Hyperlipidemia, unspecified: Secondary | ICD-10-CM | POA: Diagnosis present

## 2018-08-18 DIAGNOSIS — H353130 Nonexudative age-related macular degeneration, bilateral, stage unspecified: Secondary | ICD-10-CM | POA: Diagnosis present

## 2018-08-18 DIAGNOSIS — Z86718 Personal history of other venous thrombosis and embolism: Secondary | ICD-10-CM | POA: Diagnosis not present

## 2018-08-18 DIAGNOSIS — Z79899 Other long term (current) drug therapy: Secondary | ICD-10-CM

## 2018-08-18 DIAGNOSIS — Z7984 Long term (current) use of oral hypoglycemic drugs: Secondary | ICD-10-CM

## 2018-08-18 DIAGNOSIS — E1142 Type 2 diabetes mellitus with diabetic polyneuropathy: Secondary | ICD-10-CM | POA: Diagnosis present

## 2018-08-18 DIAGNOSIS — Z800 Family history of malignant neoplasm of digestive organs: Secondary | ICD-10-CM

## 2018-08-18 DIAGNOSIS — K219 Gastro-esophageal reflux disease without esophagitis: Secondary | ICD-10-CM | POA: Diagnosis present

## 2018-08-18 DIAGNOSIS — M109 Gout, unspecified: Secondary | ICD-10-CM | POA: Diagnosis present

## 2018-08-18 DIAGNOSIS — Z8249 Family history of ischemic heart disease and other diseases of the circulatory system: Secondary | ICD-10-CM

## 2018-08-18 DIAGNOSIS — R600 Localized edema: Secondary | ICD-10-CM | POA: Diagnosis present

## 2018-08-18 DIAGNOSIS — E876 Hypokalemia: Secondary | ICD-10-CM | POA: Diagnosis present

## 2018-08-18 DIAGNOSIS — I10 Essential (primary) hypertension: Secondary | ICD-10-CM | POA: Diagnosis present

## 2018-08-18 DIAGNOSIS — Z91013 Allergy to seafood: Secondary | ICD-10-CM

## 2018-08-18 DIAGNOSIS — Z90710 Acquired absence of both cervix and uterus: Secondary | ICD-10-CM

## 2018-08-18 DIAGNOSIS — Z86711 Personal history of pulmonary embolism: Secondary | ICD-10-CM | POA: Diagnosis not present

## 2018-08-18 DIAGNOSIS — E739 Lactose intolerance, unspecified: Secondary | ICD-10-CM | POA: Diagnosis present

## 2018-08-18 DIAGNOSIS — I480 Paroxysmal atrial fibrillation: Secondary | ICD-10-CM

## 2018-08-18 HISTORY — DX: Unspecified atrial fibrillation: I48.91

## 2018-08-18 LAB — BASIC METABOLIC PANEL
Anion gap: 10 (ref 5–15)
BUN: 19 mg/dL (ref 8–23)
CALCIUM: 9.6 mg/dL (ref 8.9–10.3)
CO2: 25 mmol/L (ref 22–32)
CREATININE: 0.6 mg/dL (ref 0.44–1.00)
Chloride: 105 mmol/L (ref 98–111)
Glucose, Bld: 133 mg/dL — ABNORMAL HIGH (ref 70–99)
Potassium: 3.9 mmol/L (ref 3.5–5.1)
SODIUM: 140 mmol/L (ref 135–145)

## 2018-08-18 LAB — MAGNESIUM: Magnesium: 1.8 mg/dL (ref 1.7–2.4)

## 2018-08-18 LAB — PROTIME-INR
INR: 2.05
Prothrombin Time: 22.9 seconds — ABNORMAL HIGH (ref 11.4–15.2)

## 2018-08-18 MED ORDER — POTASSIUM CHLORIDE CRYS ER 20 MEQ PO TBCR
20.0000 meq | EXTENDED_RELEASE_TABLET | Freq: Every day | ORAL | Status: DC
  Administered 2018-08-18: 20 meq via ORAL
  Filled 2018-08-18: qty 1

## 2018-08-18 MED ORDER — METFORMIN HCL 500 MG PO TABS
500.0000 mg | ORAL_TABLET | Freq: Two times a day (BID) | ORAL | Status: DC
  Administered 2018-08-18 – 2018-08-21 (×6): 500 mg via ORAL
  Filled 2018-08-18 (×6): qty 1

## 2018-08-18 MED ORDER — WARFARIN SODIUM 5 MG PO TABS
5.0000 mg | ORAL_TABLET | Freq: Once | ORAL | Status: AC
  Administered 2018-08-18: 5 mg via ORAL
  Filled 2018-08-18: qty 1

## 2018-08-18 MED ORDER — METOPROLOL TARTRATE 50 MG PO TABS: 50.0000 mg | ORAL_TABLET | Freq: Every day | ORAL | Status: DC

## 2018-08-18 MED ORDER — FUROSEMIDE 40 MG PO TABS: 40.0000 mg | ORAL_TABLET | ORAL | Status: DC | PRN

## 2018-08-18 MED ORDER — SODIUM CHLORIDE 0.9% FLUSH: 3.0000 mL | INTRAVENOUS | Status: DC | PRN

## 2018-08-18 MED ORDER — ALLOPURINOL 300 MG PO TABS
150.0000 mg | ORAL_TABLET | Freq: Every evening | ORAL | Status: DC
  Administered 2018-08-18 – 2018-08-20 (×3): 150 mg via ORAL
  Filled 2018-08-18 (×3): qty 1

## 2018-08-18 MED ORDER — DOFETILIDE 500 MCG PO CAPS
500.0000 ug | ORAL_CAPSULE | Freq: Two times a day (BID) | ORAL | Status: DC
  Administered 2018-08-18 – 2018-08-21 (×6): 500 ug via ORAL
  Filled 2018-08-18 (×6): qty 1

## 2018-08-18 MED ORDER — WARFARIN - PHARMACIST DOSING INPATIENT
Freq: Every day | Status: DC
  Administered 2018-08-19: 18:00:00

## 2018-08-18 MED ORDER — METOPROLOL TARTRATE 100 MG PO TABS
100.0000 mg | ORAL_TABLET | Freq: Two times a day (BID) | ORAL | Status: DC
  Administered 2018-08-18: 50 mg via ORAL
  Filled 2018-08-18 (×2): qty 1

## 2018-08-18 MED ORDER — PANTOPRAZOLE SODIUM 40 MG PO TBEC
40.0000 mg | DELAYED_RELEASE_TABLET | Freq: Every day | ORAL | Status: DC
  Administered 2018-08-19 – 2018-08-21 (×3): 40 mg via ORAL
  Filled 2018-08-18 (×3): qty 1

## 2018-08-18 MED ORDER — SODIUM CHLORIDE 0.9 % IV SOLN: 250.0000 mL | INTRAVENOUS | Status: DC | PRN

## 2018-08-18 MED ORDER — SODIUM CHLORIDE 0.9% FLUSH
3.0000 mL | Freq: Two times a day (BID) | INTRAVENOUS | Status: DC
  Administered 2018-08-18 – 2018-08-21 (×6): 3 mL via INTRAVENOUS

## 2018-08-18 NOTE — H&P (Signed)
Primary Care Physician: Josetta Huddle, MD Referring Physician: Same   Natasha Glass is a 77 y.o. 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 Cyndia Bent,  DM II, OSA with CPAP, Arthritis,DVT,Gerd, obesity, anemia, HTN,chronic LLE, that presented to PCP recently  with not feeling well  for a couple of weeks. She was found to be in afib with RVR. Her BB dose was increased and ARB stopped, she was started on xarelto with a chadsvasc score of at least 5.  On initial visit, 6/25,  Ekg showed HR reasonably controlled at 103 bpm. BP soft at 100/58. She  has complaints of fatigue/lightheadedness. She has been able to lose 36 lbs by following a low carb diet over the last several months.Last echo in 2018 with normal EF.  F/u in afib clinic, 7/5. She is tolerating afib reasonably well. We are waiting for 3 weeks of anticoagulation for attempt at cardioversion. Weight is stable. HR controlled at 99 bpm, BP still soft to attempt higher doses of rate control.Walks with a walker but is still able to get out to grocery store and get her groceries in afib. She has chronic LLE but it is stable with longstanding use of lasix.  F/u in afib clinic. She now has had 3 weeks of uninterrupted anticoagulation and will be scheduled for cardioversion. Her weight is stable, down x 3 lbs.  F/u afib clinic, 8/1, s/p cardioversion which was successful.  Remains in SR. She is feeling so much better. She is able to walk and do house duties without walker when in [place]. She is happy for this as she wants to stay in her own home/independent as long as possible.  F/u in afib clinic, 10/8. She saw Dr. Cyndia Bent last week and he noted irregular pulse. Pt had noted that she was was feeling weaker for the last couple of weeks and had to use her walker again which interferes with her autonomy. She is interested in restoring SR with antiarrythmic's.  F/u in afib clinic, 11/5, she is being admitted for Tikosyn.  She feels so much better in SR, having much more stamina and less shortness of breath with exertion. Does not have to use her walker when she is in SR.  QTc today in afib 482 , but in  SR  442 ms. She is applying for pt assistance, no benadryl use, has had 5 therapeutic INR's, last one this am at 2.4.  Today, she denies symptoms of palpitations, chest pain, shortness of breath, orthopnea, PND, lower extremity edema, dizziness, presyncope, syncope, or neurologic sequela. + for fatigue/lightheadedness.The patient is tolerating medications without difficulties and is otherwise without complaint today.       Past Medical History:  Diagnosis Date  . Anemia   . Arthritis    "legs, hands, knees, back" (04/10/2016)  . DVT (deep venous thrombosis) ([place]) 01/2009  . Dyspnea    due to weight   . GERD (gastroesophageal reflux disease)    occ.  . Gout   . Headache    "at least weekly" (04/10/2016)  . History of blood transfusion 1990s   "related to ulcerative colitis"  . History of colonic polyps 09/2010  . Hyperlipidemia   . Hypertension   . Lactose intolerance   . Macular degeneration, dry    bilateral  . Migraine    "q 2-3 months" (04/10/2016)  . Obesity   . Osteopenia   . Peripheral neuropathy   . Pneumonia 1970s  .  PONV (postoperative nausea and vomiting)    hx of years ago none recent   . Pulmonary embolism ([place]) 01/2009  . Shortness of breath on exertion   . Sleep apnea    cpap pressure 4.0-16.0   . Type II diabetes mellitus ([place])    type II   . 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 Urinary incontinence    wears peripad        Past Surgical History:  Procedure Laterality Date  . ABDOMINAL EXPLORATION SURGERY  1960  . ABDOMINAL HYSTERECTOMY  1986  . CARDIAC CATHETERIZATION    . CARDIOVERSION N/A 05/07/2018   Procedure: CARDIOVERSION;  Surgeon: Josue Hector, MD;  Location: [facility] ENDOSCOPY;  Service: Cardiovascular;  Laterality:  N/A;  . COLONOSCOPY W/ BIOPSIES AND POLYPECTOMY  2005; 2011; 2012  . COLONOSCOPY WITH PROPOFOL N/A 09/10/2016   Procedure: COLONOSCOPY WITH PROPOFOL;  Surgeon: Garlan Fair, MD;  Location: [facility] ENDOSCOPY;  Service: Endoscopy;  Laterality: N/A;  . JOINT REPLACEMENT    . KNEE ARTHROSCOPY Left ~ 1999  . SHOULDER OPEN ROTATOR CUFF REPAIR Left 2011  . TOTAL KNEE ARTHROPLASTY Bilateral 2005-2012   left-right          Current Outpatient Medications  Medication Sig Dispense Refill  . allopurinol (ZYLOPRIM) 300 MG tablet Take 150 mg by mouth every evening.     . Cholecalciferol (VITAMIN D) 2000 UNITS tablet Take 2,000 Units by mouth every morning.     . furosemide (LASIX) 40 MG tablet Take 40 mg by mouth daily. Take extra tablet every other day if swelling    . Magnesium 250 MG TABS Take 250 mg by mouth 2 (two) times daily.    . metFORMIN (GLUCOPHAGE) 500 MG tablet Take 500 mg by mouth 2 (two) times daily with a meal.     . metoprolol tartrate (LOPRESSOR) 25 MG tablet 50 mg. Take 2 tablet in the morning, one tablet at lunch and 2 tablets in the evening    . Multiple Vitamins-Minerals (PRESERVISION AREDS 2) CAPS Take 2 capsules by mouth daily.    . Naphazoline HCl (CLEAR EYES OP) Place 2 drops into both eyes 2 (two) times daily as needed (for dry eyes).    Marland Kitchen omeprazole (PRILOSEC) 20 MG capsule Take 1 capsule (20 mg total) by mouth daily. (Patient taking differently: Take 20 mg by mouth daily as needed (for acid reflux). ) 20 capsule 0  . potassium chloride SA (K-DUR,KLOR-CON) 20 MEQ tablet Take 1 tablet (20 mEq total) by mouth daily. 30 tablet 3  . warfarin (COUMADIN) 5 MG tablet Take 5 mg by mouth daily. Take as directed     No current facility-administered medications for this encounter.          Allergies  Allergen Reactions  . Morphine And Related Other (See Comments)    Unresponsive-Very bad reaction.  Can take hydrocodone.   [name] Allergy]  Shortness Of Breath, Itching, Swelling and Rash  . Amlodipine Swelling and Other (See Comments)    Of legs  . Percocet [Oxycodone-Acetaminophen] Other (See Comments)    hallucinations  . Tdap [Tetanus-Diphth-Acell Pertussis] Itching, Swelling and Rash  . Tetanus Toxoids Itching, Swelling and Rash  . Tramadol Swelling and Other (See Comments)    Of legs  . Diovan [Valsartan] Other (See Comments)    Cough  . Diphtheria Toxoid Itching, Swelling and Rash  . Macrodantin [Nitrofurantoin Macrocrystal] Other (See Comments)    unspecified    Social History  Socioeconomic History  . Marital status: Widowed    Spouse name: Not on file  . Number of children: Not on file  . Years of education: Not on file  . Highest education level: Not on file  Occupational History  . Not on file  Social Needs  . Financial resource strain: Not on file  . Food insecurity:    Worry: Not on file    Inability: Not on file  . Transportation needs:    Medical: Not on file    Non-medical: Not on file  Tobacco Use  . Smoking status: Never Smoker  . Smokeless tobacco: Never Used  Substance and Sexual Activity  . Alcohol use: No  . Drug use: No  . Sexual activity: Never  Lifestyle  . Physical activity:    Days per week: Not on file    Minutes per session: Not on file  . Stress: Not on file  Relationships  . Social connections:    Talks on phone: Not on file    Gets together: Not on file    Attends religious service: Not on file    Active member of club or organization: Not on file    Attends meetings of clubs or organizations: Not on file    Relationship status: Not on file  . Intimate partner violence:    Fear of current or ex partner: Not on file    Emotionally abused: Not on file    Physically abused: Not on file    Forced sexual activity: Not on file  Other Topics Concern  . Not on file  Social History Narrative  . Not on file          Family History  Problem Relation Age of Onset  . Colon cancer Mother   . Lung cancer Mother   . Heart disease Father     ROS- All systems are reviewed and negative except as per the HPI above  Physical Exam:    Vitals:   08/18/18 0952  BP: 116/82  Pulse: (!) 103  Weight: 122.6 kg  Height: 5' 6"  (1.676 m)      Wt Readings from Last 3 Encounters:  08/18/18 122.6 kg  07/21/18 121.6 kg  07/16/18 121.1 kg    Labs: RecentLabs       Lab Results  Component Value Date   NA 141 07/21/2018   K 3.4 (L) 07/21/2018   CL 102 07/21/2018   CO2 28 07/21/2018   GLUCOSE 108 (H) 07/21/2018   BUN 15 07/21/2018   CREATININE 0.62 07/21/2018   CALCIUM 9.6 07/21/2018   MG 1.7 07/21/2018     RecentLabs       Lab Results  Component Value Date   INR 1.97 (H) 01/19/2011     RecentLabs        Lab Results  Component Value Date   CHOL  02/07/2009    136        ATP III CLASSIFICATION:  <200     mg/dL   Desirable  200-239  mg/dL   Borderline High  >=240    mg/dL   High          HDL 45 02/07/2009   LDLCALC  02/07/2009    74        Total Cholesterol/HDL:CHD Risk Coronary Heart Disease Risk Table                     Men   Women  1/2 Average Risk  3.4   3.3  Average Risk       5.0   4.4  2 X Average Risk   9.6   7.1  3 X Average Risk  23.4   11.0        Use the calculated Patient Ratio above and the CHD Risk Table to determine the patient's CHD Risk.        ATP III CLASSIFICATION (LDL):  <100     mg/dL   Optimal  100-129  mg/dL   Near or Above                    Optimal  130-159  mg/dL   Borderline  160-189  mg/dL   High  >190     mg/dL   Very High   TRIG 84 02/07/2009       GEN- The patient is well appearing, alert and oriented x 3 today.   Head- normocephalic, atraumatic Eyes-  Sclera clear, conjunctiva pink Ears- hearing intact Oropharynx- clear Neck- supple, no JVP Lymph- no cervical lymphadenopathy Lungs- Clear to  ausculation bilaterally, normal work of breathing Heart-irregular rate and rhythm, no murmurs, rubs or gallops, PMI not laterally displaced GI- soft, NT, ND, + BS Extremities- no clubbing, cyanosis, or edema MS- no significant deformity or atrophy Skin- no rash or lesion Psych- euthymic mood, full affect Neuro- strength and sensation are intact  EKG-afib at 103 bpm, qrs int 88 ms, qtc 448 ms Echo-Study Conclusions  - Left ventricle: The cavity size was normal. Systolic function was normal. The estimated ejection fraction was in the range of 55% to 60%. Wall motion was normal; there were no regional wall motion abnormalities. Left ventricular diastolic function parameters were normal. - Mitral valve: Calcified annulus. - Left atrium: The atrium was mildly dilated. - Right atrium: The atrium was mildly dilated.    Assessment and Plan:  1. 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 Last INR this am at 2.4 Bmet/mag today with K+ at 3.9 and mag at 1.8, acceptable for Tikosyn start, Scr cl cal at 156.22 ml/min, eligible to start at 500 mcg bid No benadryl use No qtc prolonging drugs on board  2. Chadsvasc score of at least 5 Continue coumadin  To [place]. [place], [facility] [address] [place], [place] [address] [phone]    I have seen, examined the patient, and reviewed the above assessment and plan.  Changes to above are made where necessary.  On exam, iRRR.  Will admit for initiation of tikosyn.  Follow qt closely while here.  Co Sign: Thompson Grayer, MD 08/18/2018 4:58 PM

## 2018-08-18 NOTE — Progress Notes (Signed)
[place] for warfarin Indication: atrial fibrillation and hx DVT/PE   Assessment: 32 yof with persistent afib admitted for Tikosyn load, has had 5 therapeutic outpatient INR values per EP note. INR therapeutic on admit at 2.05 inpatient (2.4 in clinic per note). No CBC yet. No bleed issues documented.  PTA warfarin dose: 86m daily (last dose 11/4 PTA)  Goal of Therapy:  INR 2-3 Monitor platelets by anticoagulation protocol: Yes   Plan:  Warfarin 551mPO x 1 dose Daily INR Monitor CBC, s/sx bleeding  HaElicia LampPharmD, BCPS Clinical Pharmacist 08/18/2018 11:43 AM

## 2018-08-18 NOTE — Progress Notes (Signed)
Pharmacy Review for Dofetilide (Tikosyn) Initiation  Admit Complaint: 77 y.o. female admitted 08/18/2018 with atrial fibrillation to be initiated on dofetilide.   Assessment:  Patient Exclusion Criteria: If any screening criteria checked as "Yes", then  patient  should NOT receive dofetilide until criteria item is corrected. If "Yes" please indicate correction plan.  YES  NO Patient  Exclusion Criteria Correction Plan  [x]  []  Baseline QTc interval is greater than or equal to 440 msec. IF above YES box checked dofetilide contraindicated unless patient has ICD; then may proceed if QTc 500-550 msec or with known ventricular conduction abnormalities may proceed with QTc 550-600 msec. QTc =   QTc today in afib 482, 442 in SR Qtc acceptable per EP note  []  [x]  Magnesium level is less than 1.8 mEq/l : Last magnesium:  Lab Results  Component Value Date   MG 1.8 08/18/2018         [x]  []  Potassium level is less than 4 mEq/l : Last potassium:  Lab Results  Component Value Date   K 3.9 08/18/2018       Giving kdur 6mq daily  []  [x]  Patient is known or suspected to have a digoxin level greater than 2 ng/ml: No results found for: DIGOXIN    []  [x]  Creatinine clearance less than 20 ml/min (calculated using Cockcroft-Gault, actual body weight and serum creatinine): Estimated Creatinine Clearance: 79.9 mL/min (by C-G formula based on SCr of 0.6 mg/dL).    []  [x]  Patient has received drugs known to prolong the QT intervals within the last 48 hours (phenothiazines, tricyclics or tetracyclic antidepressants, erythromycin, H-1 antihistamines, cisapride, fluoroquinolones, azithromycin). Drugs not listed above may have an, as yet, undetected potential to prolong the QT interval, updated information on QT prolonging agents is available at this website:QT prolonging agents   []  [x]  Patient received a dose of hydrochlorothiazide (Oretic) alone or in any combination including triamterene (Dyazide, Maxzide)  in the last 48 hours.   []  [x]  Patient received a medication known to increase dofetilide plasma concentrations prior to initial dofetilide dose:  . Trimethoprim (Primsol, Proloprim) in the last 36 hours . Verapamil (Calan, Verelan) in the last 36 hours or a sustained release dose in the last 72 hours . Megestrol (Megace) in the last 5 days  . Cimetidine (Tagamet) in the last 6 hours . Ketoconazole (Nizoral) in the last 24 hours . Itraconazole (Sporanox) in the last 48 hours  . Prochlorperazine (Compazine) in the last 36 hours    []  [x]  Patient is known to have a history of torsades de pointes; congenital or acquired long QT syndromes.   []  [x]  Patient has received a Class 1 antiarrhythmic with less than 2 half-lives since last dose. (Disopyramide, Quinidine, Procainamide, Lidocaine, Mexiletine, Flecainide, Propafenone)   []  [x]  Patient has received amiodarone therapy in the past 3 months or amiodarone level is greater than 0.3 ng/ml.    Patient has been appropriately anticoagulated with warfarin.  Ordering provider was confirmed at [email or web address] they are not listed on the [facility]well[facility]cribers list.  Goal of Therapy: Follow renal function, electrolytes, potential drug interactions, and dose adjustment. Provide education and 1 week supply at discharge.  Plan:  [x]   Physician selected initial dose within range recommended for patients level of renal function - will monitor for response.  []   Physician selected initial dose outside of range recommended for patients level of renal function - will discuss if the dose should be altered at this time.  Select One Calculated CrCl  Dose q12h  [x]  > 60 ml/min 500 mcg  []  40-60 ml/min 250 mcg  []  20-40 ml/min 125 mcg   2. Follow up QTc after the first 5 doses, renal function, electrolytes (K & Mg) daily x 3     days, dose adjustment, success of initiation and facilitate 1 week discharge supply as     clinically indicated.  3.  Initiate Tikosyn education video (Call [phone] and ask for Tikosyn Video # 116).  4. Place Enrollment Form on the chart for discharge supply of dofetilide.  Elicia Lamp, PharmD, BCPS Clinical Pharmacist Clinical phone [phone] Please check AMION for all [place] contact numbers 08/18/2018 11:42 AM

## 2018-08-18 NOTE — Care Management (Signed)
#  2.  [facility] @  [place] RX # [id number]   1. TIKOSYN  125 MCG  250 MCG  500 MCG BID COVER- NONE FORMULARY PRIOR APPROVAL- YES # [phone] FOR EXCEPTION  2. DOFETILIDE  125 MCG   250 MCG   500 MCG  BID COVER- YES CO-PAY- $ 119.18 FOR EACH  PRESCRIPTION TIER- 4 DRUG PRIOR APPROVAL-NO  DEDUCTIBLE : NOT MET  PREFERRED PHARMACY : YES [facility] M[facility] 90 DAY SUPPLY FOR M/O AND RETAIL $ 325.00

## 2018-08-18 NOTE — Progress Notes (Signed)
Primary Care Physician: Josetta Huddle, MD Referring Physician: Same   Natasha Glass is a 77 y.o. 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 Cyndia Bent,  DM II, OSA with CPAP, Arthritis,DVT,Gerd, obesity, anemia, HTN,chronic LLE, that presented to PCP recently  with not feeling well  for a couple of weeks. She was found to be in afib with RVR. Her BB dose was increased and ARB stopped, she was started on xarelto with a chadsvasc score of at least 5.  On initial visit, 6/25,  Ekg showed HR reasonably controlled at 103 bpm. BP soft at 100/58. She  has complaints of fatigue/lightheadedness. She has been able to lose 36 lbs by following a low carb diet over the last several months.Last echo in 2018 with normal EF.  F/u in afib clinic, 7/5. She is tolerating afib reasonably well. We are waiting for 3 weeks of anticoagulation for attempt at cardioversion. Weight is stable. HR controlled at 99 bpm, BP still soft to attempt higher doses of rate control.Walks with a walker but is still able to get out to grocery store and get her groceries in afib. She has chronic LLE but it is stable with longstanding use of lasix.  F/u in afib clinic. She now has had 3 weeks of uninterrupted anticoagulation and will be scheduled for cardioversion. Her weight is stable, down x 3 lbs.  F/u afib clinic, 8/1, s/p cardioversion which was successful.  Remains in SR. She is feeling so much better. She is able to walk and do house duties without walker when in [place]. She is happy for this as she wants to stay in her own home/independent as long as possible.  F/u in afib clinic, 10/8. She saw Dr. Cyndia Bent last week and he noted irregular pulse. Pt had noted that she was was feeling weaker for the last couple of weeks and had to use her walker again which interferes with her autonomy. She is interested in restoring SR with antiarrythmic's.  F/u in afib clinic, 11/5, she is being admitted for Tikosyn. She  feels so much better in SR, having much more stamina and less shortness of breath with exertion. Does not have to use her walker when she is in SR.  QTc today in afib 482 , but in  SR  442 ms. She is applying for pt assistance, no benadryl use, has had 5 therapeutic INR's, last one this am at 2.4.  Today, she denies symptoms of palpitations, chest pain, shortness of breath, orthopnea, PND, lower extremity edema, dizziness, presyncope, syncope, or neurologic sequela. + for fatigue/lightheadedness.The patient is tolerating medications without difficulties and is otherwise without complaint today.   Past Medical History:  Diagnosis Date  . Anemia   . Arthritis    "legs, hands, knees, back" (04/10/2016)  . DVT (deep venous thrombosis) ([place]) 01/2009  . Dyspnea    due to weight   . GERD (gastroesophageal reflux disease)    occ.  . Gout   . Headache    "at least weekly" (04/10/2016)  . History of blood transfusion 1990s   "related to ulcerative colitis"  . History of colonic polyps 09/2010  . Hyperlipidemia   . Hypertension   . Lactose intolerance   . Macular degeneration, dry    bilateral  . Migraine    "q 2-3 months" (04/10/2016)  . Obesity   . Osteopenia   . Peripheral neuropathy   . Pneumonia 1970s  . PONV (postoperative nausea  and vomiting)    hx of years ago none recent   . Pulmonary embolism ([place]) 01/2009  . Shortness of breath on exertion   . Sleep apnea    cpap pressure 4.0-16.0   . Type II diabetes mellitus ([place])    type II   . 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 Urinary incontinence    wears peripad   Past Surgical History:  Procedure Laterality Date  . ABDOMINAL EXPLORATION SURGERY  1960  . ABDOMINAL HYSTERECTOMY  1986  . CARDIAC CATHETERIZATION    . CARDIOVERSION N/A 05/07/2018   Procedure: CARDIOVERSION;  Surgeon: Josue Hector, MD;  Location: [facility] ENDOSCOPY;  Service: Cardiovascular;  Laterality: N/A;  . COLONOSCOPY W/ BIOPSIES AND POLYPECTOMY  2005;  2011; 2012  . COLONOSCOPY WITH PROPOFOL N/A 09/10/2016   Procedure: COLONOSCOPY WITH PROPOFOL;  Surgeon: Garlan Fair, MD;  Location: [facility] ENDOSCOPY;  Service: Endoscopy;  Laterality: N/A;  . JOINT REPLACEMENT    . KNEE ARTHROSCOPY Left ~ 1999  . SHOULDER OPEN ROTATOR CUFF REPAIR Left 2011  . TOTAL KNEE ARTHROPLASTY Bilateral 2005-2012   left-right    Current Outpatient Medications  Medication Sig Dispense Refill  . allopurinol (ZYLOPRIM) 300 MG tablet Take 150 mg by mouth every evening.     . Cholecalciferol (VITAMIN D) 2000 UNITS tablet Take 2,000 Units by mouth every morning.     . furosemide (LASIX) 40 MG tablet Take 40 mg by mouth daily. Take extra tablet every other day if swelling    . Magnesium 250 MG TABS Take 250 mg by mouth 2 (two) times daily.    . metFORMIN (GLUCOPHAGE) 500 MG tablet Take 500 mg by mouth 2 (two) times daily with a meal.     . metoprolol tartrate (LOPRESSOR) 25 MG tablet 50 mg. Take 2 tablet in the morning, one tablet at lunch and 2 tablets in the evening    . Multiple Vitamins-Minerals (PRESERVISION AREDS 2) CAPS Take 2 capsules by mouth daily.    . Naphazoline HCl (CLEAR EYES OP) Place 2 drops into both eyes 2 (two) times daily as needed (for dry eyes).    Marland Kitchen omeprazole (PRILOSEC) 20 MG capsule Take 1 capsule (20 mg total) by mouth daily. (Patient taking differently: Take 20 mg by mouth daily as needed (for acid reflux). ) 20 capsule 0  . potassium chloride SA (K-DUR,KLOR-CON) 20 MEQ tablet Take 1 tablet (20 mEq total) by mouth daily. 30 tablet 3  . warfarin (COUMADIN) 5 MG tablet Take 5 mg by mouth daily. Take as directed     No current facility-administered medications for this encounter.     Allergies  Allergen Reactions  . Morphine And Related Other (See Comments)    Unresponsive-Very bad reaction.  Can take hydrocodone.   [name] Allergy] Shortness Of Breath, Itching, Swelling and Rash  . Amlodipine Swelling and Other (See Comments)     Of legs  . Percocet [Oxycodone-Acetaminophen] Other (See Comments)    hallucinations  . Tdap [Tetanus-Diphth-Acell Pertussis] Itching, Swelling and Rash  . Tetanus Toxoids Itching, Swelling and Rash  . Tramadol Swelling and Other (See Comments)    Of legs  . Diovan [Valsartan] Other (See Comments)    Cough  . Diphtheria Toxoid Itching, Swelling and Rash  . Macrodantin [Nitrofurantoin Macrocrystal] Other (See Comments)    unspecified    Social History   Socioeconomic History  . Marital status: Widowed    Spouse name: Not on file  . Number  of children: Not on file  . Years of education: Not on file  . Highest education level: Not on file  Occupational History  . Not on file  Social Needs  . Financial resource strain: Not on file  . Food insecurity:    Worry: Not on file    Inability: Not on file  . Transportation needs:    Medical: Not on file    Non-medical: Not on file  Tobacco Use  . Smoking status: Never Smoker  . Smokeless tobacco: Never Used  Substance and Sexual Activity  . Alcohol use: No  . Drug use: No  . Sexual activity: Never  Lifestyle  . Physical activity:    Days per week: Not on file    Minutes per session: Not on file  . Stress: Not on file  Relationships  . Social connections:    Talks on phone: Not on file    Gets together: Not on file    Attends religious service: Not on file    Active member of club or organization: Not on file    Attends meetings of clubs or organizations: Not on file    Relationship status: Not on file  . Intimate partner violence:    Fear of current or ex partner: Not on file    Emotionally abused: Not on file    Physically abused: Not on file    Forced sexual activity: Not on file  Other Topics Concern  . Not on file  Social History Narrative  . Not on file    Family History  Problem Relation Age of Onset  . Colon cancer Mother   . Lung cancer Mother   . Heart disease Father     ROS- All systems are  reviewed and negative except as per the HPI above  Physical Exam: Vitals:   08/18/18 0952  BP: 116/82  Pulse: (!) 103  Weight: 122.6 kg  Height: 5' 6"  (1.676 m)   Wt Readings from Last 3 Encounters:  08/18/18 122.6 kg  07/21/18 121.6 kg  07/16/18 121.1 kg    Labs: Lab Results  Component Value Date   NA 141 07/21/2018   K 3.4 (L) 07/21/2018   CL 102 07/21/2018   CO2 28 07/21/2018   GLUCOSE 108 (H) 07/21/2018   BUN 15 07/21/2018   CREATININE 0.62 07/21/2018   CALCIUM 9.6 07/21/2018   MG 1.7 07/21/2018   Lab Results  Component Value Date   INR 1.97 (H) 01/19/2011   Lab Results  Component Value Date   CHOL  02/07/2009    136        ATP III CLASSIFICATION:  <200     mg/dL   Desirable  200-239  mg/dL   Borderline High  >=240    mg/dL   High          HDL 45 02/07/2009   LDLCALC  02/07/2009    74        Total Cholesterol/HDL:CHD Risk Coronary Heart Disease Risk Table                     Men   Women  1/2 Average Risk   3.4   3.3  Average Risk       5.0   4.4  2 X Average Risk   9.6   7.1  3 X Average Risk  23.4   11.0        Use the calculated Patient Ratio above and the  CHD Risk Table to determine the patient's CHD Risk.        ATP III CLASSIFICATION (LDL):  <100     mg/dL   Optimal  100-129  mg/dL   Near or Above                    Optimal  130-159  mg/dL   Borderline  160-189  mg/dL   High  >190     mg/dL   Very High   TRIG 84 02/07/2009     GEN- The patient is well appearing, alert and oriented x 3 today.   Head- normocephalic, atraumatic Eyes-  Sclera clear, conjunctiva pink Ears- hearing intact Oropharynx- clear Neck- supple, no JVP Lymph- no cervical lymphadenopathy Lungs- Clear to ausculation bilaterally, normal work of breathing Heart-irregular rate and rhythm, no murmurs, rubs or gallops, PMI not laterally displaced GI- soft, NT, ND, + BS Extremities- no clubbing, cyanosis, or edema MS- no significant deformity or atrophy Skin- no rash  or lesion Psych- euthymic mood, full affect Neuro- strength and sensation are intact  EKG-afib at 103 bpm, qrs int 88 ms, qtc 448 ms Echo-Study Conclusions  - Left ventricle: The cavity size was normal. Systolic function was   normal. The estimated ejection fraction was in the range of 55%   to 60%. Wall motion was normal; there were no regional wall   motion abnormalities. Left ventricular diastolic function   parameters were normal. - Mitral valve: Calcified annulus. - Left atrium: The atrium was mildly dilated. - Right atrium: The atrium was mildly dilated.    Assessment and Plan:  1. 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 Last INR this am at 2.4 Bmet/mag today with K+ at 3.9 and mag at 1.8, acceptable for Tikosyn start, Scr cl cal at 156.22 ml/min, eligible to start at 500 mcg bid No benadryl use No qtc prolonging drugs on board  2. Chadsvasc score of at least 5 Continue coumadin  To [place]. Whittney Steenson, [facility] [address] [place], [place] [address] [phone]

## 2018-08-18 NOTE — Progress Notes (Addendum)
Natasha Glass [id number] Admission Data: 08/18/2018 11:54 AM Attending Provider: Thompson Grayer, MD  XQK:SKSHN, Natasha Baltimore, MD Consults/ Treatment Team:   Natasha Glass is a 77 y.o. female patient admitted direct from Dr office. awake, alert  & orientated  X 3,  Full Code, VSS - Blood pressure (!) 144/92, pulse 90, temperature 97.7 F (36.5 C), temperature source Oral, SpO2 98 %., room air no c/o shortness of breath, no c/o chest pain, no distress noted. Tele # 11 placed.   Allergies:   Allergies  Allergen Reactions  . Morphine And Related Other (See Comments)    Unresponsive-Very bad reaction.  Can take hydrocodone.   Natasha Glass Allergy] Shortness Of Breath, Itching, Swelling and Rash  . Amlodipine Swelling and Other (See Comments)    Of legs  . Percocet [Oxycodone-Acetaminophen] Other (See Comments)    hallucinations  . Tdap [Tetanus-Diphth-Acell Pertussis] Itching, Swelling and Rash  . Tetanus Toxoids Itching, Swelling and Rash  . Tramadol Swelling and Other (See Comments)    Of legs  . Diovan [Valsartan] Other (See Comments)    Cough  . Diphtheria Toxoid Itching, Swelling and Rash  . Macrodantin [Nitrofurantoin Macrocrystal] Other (See Comments)    unspecified     Past Medical History:  Diagnosis Date  . Anemia   . Arthritis    "legs, hands, knees, back" (04/10/2016)  . Atrial fibrillation ([place]) 08/2018  . DVT (deep venous thrombosis) ([place]) 01/2009  . Dyspnea    due to weight   . GERD (gastroesophageal reflux disease)    occ.  . Gout   . Headache    "at least weekly" (04/10/2016)  . History of blood transfusion 1990s   "related to ulcerative colitis"  . History of colonic polyps 09/2010  . Hyperlipidemia   . Hypertension   . Lactose intolerance   . Macular degeneration, dry    bilateral  . Migraine    "q 2-3 months" (04/10/2016)  . Obesity   . Osteopenia   . Peripheral neuropathy   . Pneumonia 1970s  . PONV (postoperative nausea and vomiting)    hx of  years ago none recent   . Pulmonary embolism ([place]) 01/2009  . Shortness of breath on exertion   . Sleep apnea    cpap pressure 4.0-16.0   . Type II diabetes mellitus ([place])    type II   . 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 Urinary incontinence    wears peripad    History:  obtained from Patient  Pt orientation to unit, room and routine. Information packet given to patient/family.  Admission INP armband ID verified with patient/family, and in place. SR up x 2, fall risk assessment complete with Patient and family verbalizing understanding of risks associated with falls. Pt verbalizes an understanding of how to use the call bell and to call for help before getting out of bed.  Skin with MASD under the folds breast,abdomen and in between the buttocks.     Will cont to monitor and assist as needed. [name] RN 08/18/2018 11:54 AM

## 2018-08-19 ENCOUNTER — Ambulatory Visit (HOSPITAL_COMMUNITY): Payer: Medicare Other | Admitting: Nurse Practitioner

## 2018-08-19 LAB — BASIC METABOLIC PANEL
ANION GAP: 5 (ref 5–15)
BUN: 19 mg/dL (ref 8–23)
CO2: 28 mmol/L (ref 22–32)
CREATININE: 0.62 mg/dL (ref 0.44–1.00)
Calcium: 9.3 mg/dL (ref 8.9–10.3)
Chloride: 109 mmol/L (ref 98–111)
GFR calc non Af Amer: >60 mL/min (ref >60)
Glucose, Bld: 109 mg/dL — ABNORMAL HIGH (ref 70–99)
POTASSIUM: 3.7 mmol/L (ref 3.5–5.1)
SODIUM: 142 mmol/L (ref 135–145)

## 2018-08-19 LAB — PROTIME-INR
INR: 2.01
Prothrombin Time: 22.5 seconds — ABNORMAL HIGH (ref 11.4–15.2)

## 2018-08-19 LAB — MAGNESIUM: Magnesium: 1.7 mg/dL (ref 1.7–2.4)

## 2018-08-19 MED ORDER — MAGNESIUM OXIDE 400 (241.3 MG) MG PO TABS
400.0000 mg | ORAL_TABLET | Freq: Two times a day (BID) | ORAL | Status: DC
  Administered 2018-08-19 – 2018-08-20 (×4): 400 mg via ORAL
  Filled 2018-08-19 (×4): qty 1

## 2018-08-19 MED ORDER — WARFARIN SODIUM 5 MG PO TABS
6.0000 mg | ORAL_TABLET | Freq: Once | ORAL | Status: AC
  Administered 2018-08-19: 6 mg via ORAL
  Filled 2018-08-19: qty 1

## 2018-08-19 MED ORDER — METOPROLOL TARTRATE 25 MG PO TABS
25.0000 mg | ORAL_TABLET | Freq: Every day | ORAL | Status: DC
  Administered 2018-08-19 – 2018-08-21 (×3): 25 mg via ORAL
  Filled 2018-08-19 (×3): qty 1

## 2018-08-19 MED ORDER — METOPROLOL TARTRATE 50 MG PO TABS
50.0000 mg | ORAL_TABLET | Freq: Two times a day (BID) | ORAL | Status: DC
  Administered 2018-08-19 – 2018-08-21 (×5): 50 mg via ORAL
  Filled 2018-08-19 (×5): qty 1

## 2018-08-19 MED ORDER — WARFARIN SODIUM 5 MG PO TABS
5.0000 mg | ORAL_TABLET | Freq: Every day | ORAL | Status: DC
  Administered 2018-08-20: 5 mg via ORAL
  Filled 2018-08-19: qty 1

## 2018-08-19 MED ORDER — ACETAMINOPHEN 500 MG PO TABS
500.0000 mg | ORAL_TABLET | Freq: Once | ORAL | Status: AC
  Administered 2018-08-19: 500 mg via ORAL
  Filled 2018-08-19: qty 1

## 2018-08-19 MED ORDER — POTASSIUM CHLORIDE CRYS ER 20 MEQ PO TBCR
40.0000 meq | EXTENDED_RELEASE_TABLET | Freq: Every day | ORAL | Status: DC
  Administered 2018-08-19 – 2018-08-21 (×3): 40 meq via ORAL
  Filled 2018-08-19 (×3): qty 2

## 2018-08-19 NOTE — Progress Notes (Signed)
Dr Rayann Heman reviewed EKG 2 hours post Tikosyn.  Ok to continue same dose tonight and will reassess in AM.  Chanetta Marshall, NP 08/19/2018 2:46 PM

## 2018-08-19 NOTE — Progress Notes (Addendum)
[place] for warfarin Indication: atrial fibrillation and hx DVT/PE   Assessment: 49 yof with persistent afib admitted for Tikosyn load, has had 5 therapeutic outpatient INR values per EP note. INR therapeutic on admit at 2.05 and 2.01 today. No bleed issues documented. Will give slight boost to keep INR > 2 as possibel DCCV 11/7 if patient dose not convert to SR today  PTA warfarin dose: 61m daily (last dose 11/4 PTA) Mag and K low this am replacement orderd by NP this am  Goal of Therapy:  INR 2-3 Monitor platelets by anticoagulation protocol: Yes   Plan:  Warfarin 61mPO x 1 dose tonight  Then resume PTA warfarin 47m51maily tomorrow Daily INR Monitor CBC, s/sx bleeding  LisBonnita Nasutiarm.D. CPP, BCPS Clinical Pharmacist [phone]/03/2018 9:20 AM

## 2018-08-19 NOTE — Progress Notes (Addendum)
Electrophysiology Rounding Note  Patient Name: Natasha Glass Date of Encounter: 08/19/2018  Primary Cardiologist: Irish Lack Electrophysiologist: Anzal Bartnick (new this admission)   Subjective   The patient is doing well today.  At this time, the patient denies chest pain, shortness of breath, or any new concerns.  Inpatient Medications    Scheduled Meds: . allopurinol  150 mg Oral QPM  . dofetilide  500 mcg Oral BID  . magnesium oxide  400 mg Oral BID  . metFORMIN  500 mg Oral BID WC  . metoprolol tartrate  100 mg Oral BID  . metoprolol tartrate  50 mg Oral Q lunch  . pantoprazole  40 mg Oral Daily  . potassium chloride SA  40 mEq Oral Daily  . sodium chloride flush  3 mL Intravenous Q12H  . Warfarin - Pharmacist Dosing Inpatient   Does not apply q1800   Continuous Infusions: . sodium chloride     PRN Meds: sodium chloride, sodium chloride flush   Vital Signs    Vitals:   08/18/18 1119 08/18/18 1923 08/19/18 0634 08/19/18 0833  BP: (!) 144/92 123/77 109/78 132/87  Pulse: 90 86 (!) 106   Resp:  16    Temp: 97.7 F (36.5 C) (!) 97.5 F (36.4 C) 98.2 F (36.8 C)   TempSrc: Oral Oral Oral   SpO2: 98% 98% 94%   Weight:   122.1 kg     Intake/Output Summary (Last 24 hours) at 08/19/2018 0907 Last data filed at 08/18/2018 2100 Gross per 24 hour  Intake 360 ml  Output 1000 ml  Net -640 ml   Filed Weights   08/19/18 0634  Weight: 122.1 kg    Physical Exam    GEN- The patient is elderly appearing, alert and oriented x 3 today.   Head- normocephalic, atraumatic Eyes-  Sclera clear, conjunctiva pink Ears- hearing intact Oropharynx- clear Neck- supple Lungs- Clear to ausculation bilaterally, normal work of breathing Heart- Irregular rate and rhythm  GI- soft, NT, ND, + BS Extremities- no clubbing, cyanosis, or edema Skin- no rash or lesion Psych- euthymic mood, full affect Neuro- strength and sensation are intact  Labs    Basic Metabolic Panel Recent Labs      08/18/18 1046 08/19/18 0439  NA 140 142  K 3.9 3.7  CL 105 109  CO2 25 28  GLUCOSE 133* 109*  BUN 19 19  CREATININE 0.60 0.62  CALCIUM 9.6 9.3  MG 1.8 1.7    Telemetry    Atrial fibrillation (personally reviewed)  Radiology    No results found.   Patient Profile     Natasha Glass is a 77 y.o. female admitted for Tikosyn load  Assessment & Plan    1.  Persistent atrial fibrillation Admitted for Tikosyn QTc stable K+ and Mg+ low, will supplement Continue Warfarin for CHADS2VASC of 5 Plan DCCV tomorrow if still in AF NPO after midnight tonight  2.  HTN Stable No change required today   For questions or updates, please contact [facility] Please consult [email or web address] for contact info under Cardiology/STEMI.  Signed, Chanetta Marshall, NP  08/19/2018, 9:07 AM    I have seen, examined the patient, and reviewed the above assessment and plan.  Changes to above are made where necessary.  On exam, iRRR.  Remains in afib.  Continue tikosyn load.  QT is borderline.  Will reassess in am.  Continue current tikosyn dosing. NPO after midnight for possible cardioversion in AM.  Co Sign: Jeneen Rinks  Johnattan Strassman, MD 08/19/2018

## 2018-08-20 ENCOUNTER — Encounter (HOSPITAL_COMMUNITY): Payer: Self-pay | Admitting: Certified Registered"

## 2018-08-20 ENCOUNTER — Encounter (HOSPITAL_COMMUNITY)
Admission: AD | Disposition: A | Discharge status: Home / Self Care | Payer: Self-pay | Source: Ambulatory Visit | Attending: Internal Medicine

## 2018-08-20 DIAGNOSIS — I10 Essential (primary) hypertension: Secondary | ICD-10-CM

## 2018-08-20 LAB — BASIC METABOLIC PANEL
ANION GAP: 8 (ref 5–15)
BUN: 18 mg/dL (ref 8–23)
CALCIUM: 9.3 mg/dL (ref 8.9–10.3)
CO2: 24 mmol/L (ref 22–32)
Chloride: 108 mmol/L (ref 98–111)
Creatinine, Ser: 0.54 mg/dL (ref 0.44–1.00)
GLUCOSE: 96 mg/dL (ref 70–99)
POTASSIUM: 3.8 mmol/L (ref 3.5–5.1)
Sodium: 140 mmol/L (ref 135–145)

## 2018-08-20 LAB — PROTIME-INR
INR: 2.34
Prothrombin Time: 25.3 seconds — ABNORMAL HIGH (ref 11.4–15.2)

## 2018-08-20 LAB — MAGNESIUM: MAGNESIUM: 1.8 mg/dL (ref 1.7–2.4)

## 2018-08-20 SURGERY — CARDIOVERSION
Anesthesia: General

## 2018-08-20 NOTE — Progress Notes (Signed)
RT offered to place pt on CPAP for the night but pt declined stating she did not like our machine and that she was going home tomorrow. RT will continue to monitor.

## 2018-08-20 NOTE — Care Management Note (Signed)
Case Management Note  Patient Details  Name: Natasha Glass MRN: [id number] Date of Birth: 30-Aug-1941  Subjective/Objective: Pt presented for Tikosyn Load. Patient is aware of co pay $119.00. Patient states co pay is expensive. Patient had filled out the patient assistance application via the [facility].                    Action/Plan: Please e-scribe Rx for 7 day supply to [facility] and medications will be delivered to bedside. No further needs from CM at this time.   Expected Discharge Date:                  Expected Discharge Plan:  Home/[place] Care  In-House Referral:  NA  Discharge planning Services  CM Consult, Medication Assistance  Post Acute Care Choice:  NA Choice offered to:  NA  DME Arranged:  N/A DME Agency:  NA  HH Arranged:  NA HH Agency:  NA  Status of Service:  Completed, signed off  If discussed at [place] of Stay Meetings, dates discussed:    Additional Comments:  Bethena Roys, RN 08/20/2018, 12:41 PM

## 2018-08-20 NOTE — Progress Notes (Addendum)
Electrophysiology Rounding Note  Patient Name: Natasha Glass Date of Encounter: 08/20/2018  Primary Cardiologist: Irish Lack Electrophysiologist: Carvell Hoeffner   Subjective   The patient is doing well today.  At this time, the patient denies chest pain, shortness of breath, or any new concerns.  Inpatient Medications    Scheduled Meds: . allopurinol  150 mg Oral QPM  . dofetilide  500 mcg Oral BID  . magnesium oxide  400 mg Oral BID  . metFORMIN  500 mg Oral BID WC  . metoprolol tartrate  25 mg Oral Q lunch  . metoprolol tartrate  50 mg Oral BID  . pantoprazole  40 mg Oral Daily  . potassium chloride SA  40 mEq Oral Daily  . sodium chloride flush  3 mL Intravenous Q12H  . warfarin  5 mg Oral q1800  . Warfarin - Pharmacist Dosing Inpatient   Does not apply q1800   Continuous Infusions: . sodium chloride     PRN Meds: sodium chloride, sodium chloride flush   Vital Signs    Vitals:   08/19/18 2023 08/19/18 2100 08/20/18 0050 08/20/18 0653  BP: 129/80 131/89  (!) 128/94  Pulse: 93 96 91 (!) 131  Resp: 16  16 18   Temp: 97.6 F (36.4 C)   97.8 F (36.6 C)  TempSrc: Oral   Oral  SpO2: 98%  98% 100%  Weight:    122.4 kg    Intake/Output Summary (Last 24 hours) at 08/20/2018 0834 Last data filed at 08/19/2018 2109 Gross per 24 hour  Intake 663 ml  Output 600 ml  Net 63 ml   Filed Weights   08/19/18 0634 08/20/18 0653  Weight: 122.1 kg 122.4 kg    Physical Exam    GEN- The patient is well appearing, alert and oriented x 3 today.   Head- normocephalic, atraumatic Eyes-  Sclera clear, conjunctiva pink Ears- hearing intact Oropharynx- clear Neck- supple Lungs- Clear to ausculation bilaterally, normal work of breathing Heart- Regular rate and rhythm, no murmurs, rubs or gallops GI- soft, NT, ND, + BS Extremities- no clubbing, cyanosis, or edema Skin- no rash or lesion Psych- euthymic mood, full affect Neuro- strength and sensation are intact  Labs    CBC No  results for input(s): WBC, NEUTROABS, HGB, HCT, MCV, PLT in the last 72 hours. Basic Metabolic Panel Recent Labs    08/19/18 0439 08/20/18 0353  NA 142 140  K 3.7 3.8  CL 109 108  CO2 28 24  GLUCOSE 109* 96  BUN 19 18  CREATININE 0.62 0.54  CALCIUM 9.3 9.3  MG 1.7 1.8   Liver Function Tests No results for input(s): AST, ALT, ALKPHOS, BILITOT, PROT, ALBUMIN in the last 72 hours. No results for input(s): LIPASE, AMYLASE in the last 72 hours. Cardiac Enzymes No results for input(s): CKTOTAL, CKMB, CKMBINDEX, TROPONINI in the last 72 hours. BNP Invalid input(s): POCBNP D-Dimer No results for input(s): DDIMER in the last 72 hours. Hemoglobin A1C No results for input(s): HGBA1C in the last 72 hours. Fasting Lipid Panel No results for input(s): CHOL, HDL, LDLCALC, TRIG, CHOLHDL, LDLDIRECT in the last 72 hours. Thyroid Function Tests No results for input(s): TSH, T4TOTAL, T3FREE, THYROIDAB in the last 72 hours.  Invalid input(s): FREET3  Telemetry    Atrial fibrillation (personally reviewed)  Radiology    No results found.   Patient Profile     Anber Mckiver [place] is a 77 y.o. female admitted for Tikosyn load  Assessment & Plan  1.  Persistent atrial fibrillation Admitted for Tikosyn load QTc stable Continue K+ and Mg+ supplementation Continue Warfarin DCCV today  2.  HTN Stable No change required today   For questions or updates, please contact Center Please consult [email or web address] for contact info under Cardiology/STEMI.  Signed, Chanetta Marshall, NP  08/20/2018, 8:34 AM   I have seen, examined the patient, and reviewed the above assessment and plan.  Changes to above are made where necessary.  On exam, iRRR.  Remains in afib.  Planned for cardioversion today.  BP is stable.  Hopefully home tomorrow.  Co Sign: Thompson Grayer, MD 08/20/2018 10:49 AM

## 2018-08-20 NOTE — Progress Notes (Signed)
[place] for warfarin Indication: atrial fibrillation and hx DVT/PE   Assessment: 89 yof with persistent afib admitted for Tikosyn load, has had 5 therapeutic outpatient INR values per EP note. INR therapeutic on admit at 2.05 and 2.3 today after slight boost last nite to keep > 2 for DCCV today.  No bleeding issues documented.   PTA warfarin dose: 55m daily  Mag and K low this am replacement orderd by NP   Goal of Therapy:  INR 2-3 Monitor platelets by anticoagulation protocol: Yes   Plan:  Warfarin 549mdaily - home dose Daily INR Monitor CBC, s/sx bleeding  LiBonnita Nasutiharm.D. CPP, BCPS Clinical Pharmacist [phone]/04/2018 10:16 AM

## 2018-08-20 NOTE — Discharge Instructions (Addendum)
You have an appointment set up with the [facility].  Multiple studies have shown that being followed by a dedicated atrial fibrillation clinic in addition to the standard care you receive from your other physicians improves health. We believe that enrollment in the atrial fibrillation clinic will allow [place] to better care for you.   The phone number to the [facility] is [phone]. The clinic is staffed Monday through Friday from 8:30am to 5pm.  Parking Directions: The clinic is located in the Heart and Vascular Building connected to [facility]. 1)From [address] turn on to [address] and go to the 3rd entrance  (Heart and Vascular entrance) on the right. 2)Look to the right for Heart &Vascular Parking Garage. 3)A code for the entrance is required please call the clinic to receive this.   4)Take the elevators to the 1st floor. Registration is in the room with the glass walls at the end of the hallway.  If you have any trouble parking or locating the clinic, please dont hesitate to call [phone].  Information on my medicine - Coumadin   (Warfarin)  This medication education was reviewed with me or my healthcare representative as part of my discharge preparation.    Why was Coumadin prescribed for you? Coumadin was prescribed for you because you have a blood clot or a medical condition that can cause an increased risk of forming blood clots. Blood clots can cause serious health problems by blocking the flow of blood to the heart, lung, or brain. Coumadin can prevent harmful blood clots from forming. As a reminder your indication for Coumadin is:   Stroke Prevention Because Of Atrial Fibrillation  What test will check on my response to Coumadin? While on Coumadin (warfarin) you will need to have an INR test regularly to ensure that your dose is keeping you in the desired range. The INR (international normalized ratio) number is calculated from  the result of the laboratory test called prothrombin time (PT).  If an INR APPOINTMENT HAS NOT ALREADY BEEN MADE FOR YOU please schedule an appointment to have this lab work done by your health care provider within 7 days. Your INR goal is a number between:  2 to 3.  What  do you need to  know  About  COUMADIN? Take Coumadin (warfarin) exactly as prescribed by your healthcare provider about the same time each day.  DO NOT stop taking without talking to the doctor who prescribed the medication.  Stopping without other blood clot prevention medication to take the place of Coumadin may increase your risk of developing a new clot or stroke.  Get refills before you run out.  What do you do if you miss a dose? If you miss a dose, take it as soon as you remember on the same day then continue your regularly scheduled regimen the next day.  Do not take two doses of Coumadin at the same time.  Important Safety Information A possible side effect of Coumadin (Warfarin) is an increased risk of bleeding. You should call your healthcare provider right away if you experience any of the following: ? Bleeding from an injury or your nose that does not stop. ? Unusual colored urine (red or dark brown) or unusual colored stools (red or black). ? Unusual bruising for unknown reasons. ? A serious fall or if you hit your head (even if there is no bleeding).  Some foods or medicines interact with Coumadin (warfarin) and might alter your  response to warfarin. To help avoid this: ? Eat a balanced diet, maintaining a consistent amount of Vitamin K. ? Notify your provider about major diet changes you plan to make. ? Avoid alcohol or limit your intake to 1 drink for women and 2 drinks for men per day. (1 drink is 5 oz. wine, 12 oz. beer, or 1.5 oz. liquor.)  Make sure that ANY health care provider who prescribes medication for you knows that you are taking Coumadin (warfarin).  Also make sure the healthcare provider who  is monitoring your Coumadin knows when you have started a new medication including herbals and non-prescription products.  Coumadin (Warfarin)  Major Drug Interactions  Increased Warfarin Effect Decreased Warfarin Effect  Alcohol (large quantities) Antibiotics (esp. Septra/Bactrim, Flagyl, Cipro) Amiodarone (Cordarone) Aspirin (ASA) Cimetidine (Tagamet) Megestrol (Megace) NSAIDs (ibuprofen, naproxen, etc.) Piroxicam (Feldene) Propafenone (Rythmol SR) Propranolol (Inderal) Isoniazid (INH) Posaconazole (Noxafil) Barbiturates (Phenobarbital) Carbamazepine (Tegretol) Chlordiazepoxide (Librium) Cholestyramine (Questran) Griseofulvin Oral Contraceptives Rifampin Sucralfate (Carafate) Vitamin K   Coumadin (Warfarin) Major Herbal Interactions  Increased Warfarin Effect Decreased Warfarin Effect  Garlic Ginseng Ginkgo biloba Coenzyme Q10 Green tea St. Johns wort    Coumadin (Warfarin) FOOD Interactions  Eat a consistent number of servings per week of foods HIGH in Vitamin K (1 serving =  cup)  Collards (cooked, or boiled & drained) Kale (cooked, or boiled & drained) Mustard greens (cooked, or boiled & drained) Parsley *serving size only =  cup Spinach (cooked, or boiled & drained) Swiss chard (cooked, or boiled & drained) Turnip greens (cooked, or boiled & drained)  Eat a consistent number of servings per week of foods MEDIUM-HIGH in Vitamin K (1 serving = 1 cup)  Asparagus (cooked, or boiled & drained) Broccoli (cooked, boiled & drained, or raw & chopped) Brussel sprouts (cooked, or boiled & drained) *serving size only =  cup Lettuce, raw (green leaf, endive, romaine) Spinach, raw Turnip greens, raw & chopped   These websites have more information on Coumadin (warfarin):  FailFactory.se; VeganReport.com.au;

## 2018-08-20 NOTE — Progress Notes (Signed)
RT placed pt on CPAP with home setting of 4 cmH2O. Pt resting comfortably. RT will continue to monitor.

## 2018-08-21 DIAGNOSIS — G4733 Obstructive sleep apnea (adult) (pediatric): Secondary | ICD-10-CM

## 2018-08-21 LAB — MAGNESIUM: Magnesium: 1.8 mg/dL (ref 1.7–2.4)

## 2018-08-21 LAB — BASIC METABOLIC PANEL
ANION GAP: 8 (ref 5–15)
BUN: 22 mg/dL (ref 8–23)
CHLORIDE: 111 mmol/L (ref 98–111)
CO2: 21 mmol/L — AB (ref 22–32)
Calcium: 9.3 mg/dL (ref 8.9–10.3)
Creatinine, Ser: 0.77 mg/dL (ref 0.44–1.00)
GFR calc non Af Amer: >60 mL/min (ref >60)
Glucose, Bld: 108 mg/dL — ABNORMAL HIGH (ref 70–99)
Potassium: 4.2 mmol/L (ref 3.5–5.1)
Sodium: 140 mmol/L (ref 135–145)

## 2018-08-21 LAB — PROTIME-INR
INR: 2.61
PROTHROMBIN TIME: 27.6 s — AB (ref 11.4–15.2)

## 2018-08-21 MED ORDER — MAGNESIUM SULFATE IN D5W 1-5 GM/100ML-% IV SOLN
1.0000 g | Freq: Once | INTRAVENOUS | Status: AC
  Administered 2018-08-21: 1 g via INTRAVENOUS
  Filled 2018-08-21: qty 100

## 2018-08-21 MED ORDER — DOFETILIDE 500 MCG PO CAPS: 500.0000 ug | ORAL_CAPSULE | Freq: Two times a day (BID) | ORAL | 0 refills | Status: DC

## 2018-08-21 MED ORDER — POTASSIUM CHLORIDE CRYS ER 20 MEQ PO TBCR: 30.0000 meq | EXTENDED_RELEASE_TABLET | Freq: Every day | ORAL | 6 refills | Status: DC

## 2018-08-21 MED ORDER — DOFETILIDE 500 MCG PO CAPS: 500.0000 ug | ORAL_CAPSULE | Freq: Two times a day (BID) | ORAL | 6 refills | Status: DC

## 2018-08-21 MED ORDER — MAGNESIUM OXIDE 400 (241.3 MG) MG PO TABS: 400.0000 mg | ORAL_TABLET | Freq: Two times a day (BID) | ORAL | 6 refills | Status: DC

## 2018-08-21 MED FILL — TIKOSYN 500 MCG CAPS: 500 | 7 days supply | Qty: 14 | Fill #0

## 2018-08-21 NOTE — Discharge Summary (Addendum)
ELECTROPHYSIOLOGY PROCEDURE DISCHARGE SUMMARY    Patient ID: Natasha Glass,  MRN: [id number], DOB/AGE: 77-05-1941 77 y.o.  Admit date: 08/18/2018 Discharge date: 08/21/2018  Primary Care Physician: Josetta Huddle, MD  Electrophysiologist: new this admission, Dr. Rayann Heman  Primary Discharge Diagnosis:  1.  Persistent atrial fibrillation status post Tikosyn loading this admission      CHA2DS2Vasc is 5, on warfarin.  Monitored and managed with her PMD.  She has an appt 09/02/18  Secondary Discharge Diagnosis:  1. DM 2. OSA w/CPAP 3. HTN 4. Chronic LE edema  Allergies  Allergen Reactions  . Morphine And Related Other (See Comments)    Unresponsive-Very bad reaction.  Can take hydrocodone.   [name] Allergy] Shortness Of Breath, Itching, Swelling and Rash  . Amlodipine Swelling and Other (See Comments)    Of legs  . Percocet [Oxycodone-Acetaminophen] Other (See Comments)    hallucinations  . Tdap [Tetanus-Diphth-Acell Pertussis] Itching, Swelling and Rash  . Tetanus Toxoids Itching, Swelling and Rash  . Tramadol Swelling and Other (See Comments)    Of legs  . Lactose Intolerance (Gi)   . Diovan [Valsartan] Other (See Comments)    Cough  . Diphtheria Toxoid Itching, Swelling and Rash  . Macrodantin [Nitrofurantoin Macrocrystal] Other (See Comments)    unspecified     Procedures This Admission:  1.  Tikosyn loading    Brief HPI: Natasha Glass is a 77 y.o. female with a past medical history as noted above.  She was referred to the AFib clinic in the outpatient setting for treatment options of atrial fibrillation.  Risks, benefits, and alternatives to Tikosyn were reviewed with the patient who wished to proceed.    Hospital Course:  The patient was admitted and Tikosyn was initiated.  Renal function and electrolytes were followed during the hospitalization. Her potassium and magnesium required replacement during her stay and will be continued out patient.   Her QTc remained stable.  She converted to SR with drug and did not require DCCV.  The patient was were monitored until discharge on telemetry which demonstrated SR.  On the day of discharge, she feels well, no CP or SOB, the patient was examined by Dr Rayann Heman who considered her stable for discharge to home.  Early follow-up has been arranged with the AFib clinic, and with Dr Rayann Heman in 4 weeks.   Physical Exam: Vitals:   08/20/18 1412 08/20/18 2115 08/20/18 2213 08/21/18 0544  BP: 116/78 113/64  117/64  Pulse: 69 61  70  Resp:      Temp: 97.9 F (36.6 C)  97.6 F (36.4 C) 97.7 F (36.5 C)  TempSrc: Oral  Oral Oral  SpO2: 95%  99% 97%  Weight:    123.4 kg    GEN- The patient is well appearing, alert and oriented x 3 today.   HEENT: normocephalic, atraumatic; sclera clear, conjunctiva pink; hearing intact; oropharynx clear; neck supple, no JVP Lymph- no cervical lymphadenopathy Lungs- CTA b/l, normal work of breathing.  No wheezes, rales, rhonchi Heart- RRR, no murmurs, rubs or gallops, PMI not laterally displaced GI- soft, non-tender, non-distended Extremities- no clubbing, cyanosis, chronic edema unchanged per the patient, chronic skin changes MS- no significant deformity or atrophy Skin- warm and dry, no rash or lesion Psych- euthymic mood, full affect Neuro- strength and sensation are intact   Labs:   Lab Results  Component Value Date   WBC 5.7 04/29/2018   HGB 15.0 05/07/2018   HCT 44.0  05/07/2018   MCV 87.4 04/29/2018   PLT 139 (L) 04/29/2018    Recent Labs  Lab 08/21/18 0310  NA 140  K 4.2  CL 111  CO2 21*  BUN 22  CREATININE 0.77  CALCIUM 9.3  GLUCOSE 108*     Discharge Medications:  Allergies as of 08/21/2018      Reactions   Morphine And Related Other (See Comments)   Unresponsive-Very bad reaction.  Can take hydrocodone.    Oysters [shellfish Allergy] Shortness Of Breath, Itching, Swelling, Rash   Amlodipine Swelling, Other (See Comments)   Of legs     Percocet [oxycodone-acetaminophen] Other (See Comments)   hallucinations   Tdap [tetanus-diphth-acell Pertussis] Itching, Swelling, Rash   Tetanus Toxoids Itching, Swelling, Rash   Tramadol Swelling, Other (See Comments)   Of legs   Lactose Intolerance (gi)    Diovan [valsartan] Other (See Comments)   Cough   Diphtheria Toxoid Itching, Swelling, Rash   Macrodantin [nitrofurantoin Macrocrystal] Other (See Comments)   unspecified      Medication List    STOP taking these medications   Magnesium 250 MG Tabs     TAKE these medications   allopurinol 300 MG tablet Commonly known as:  ZYLOPRIM Take 150 mg by mouth every evening.   CLEAR EYES OP Place 2 drops into both eyes 2 (two) times daily as needed (for dry eyes).   dofetilide 500 MCG capsule Commonly known as:  TIKOSYN Take 1 capsule (500 mcg total) by mouth 2 (two) times daily.   furosemide 40 MG tablet Commonly known as:  LASIX Take 40 mg by mouth daily.   magnesium oxide 400 (241.3 Mg) MG tablet Commonly known as:  MAG-OX Take 1 tablet (400 mg total) by mouth 2 (two) times daily.   metFORMIN 500 MG tablet Commonly known as:  GLUCOPHAGE Take 500 mg by mouth 2 (two) times daily with a meal.   metoprolol tartrate 25 MG tablet Commonly known as:  LOPRESSOR Take 25-50 mg by mouth See admin instructions. Take 2 tablets in the morning, 1 tablet at lunch and 2 tablets in the evening.   omeprazole 20 MG capsule Commonly known as:  PRILOSEC Take 1 capsule (20 mg total) by mouth daily. What changed:    when to take this  reasons to take this   potassium chloride SA 20 MEQ tablet Commonly known as:  K-DUR,KLOR-CON Take 1.5 tablets (30 mEq total) by mouth daily. Start taking on:  08/22/2018 What changed:  how much to take   PRESERVISION AREDS 2 Caps Take 2 capsules by mouth daily.   Vitamin D 50 MCG (2000 UT) tablet Take 2,000 Units by mouth every morning.   warfarin 5 MG tablet Commonly known as:   COUMADIN Take 5 mg by mouth daily.       Disposition: Home  Discharge Instructions    Diet - low sodium heart healthy   Complete by:  As directed    Increase activity slowly   Complete by:  As directed        Duration of Discharge Encounter: Greater than 30 minutes including physician time.  Signed, Tommye Standard, PA-C 08/21/2018 12:03 PM  I have seen, examined the patient, and reviewed the above assessment and plan.  Changes to above are made where necessary.  On exam, RRR.  Remains in sinus rhythm.  QT is stable. DC to home with close follow-up in the AF clinic.  Co Sign: Thompson Grayer, MD 08/21/2018 11:29 PM

## 2018-08-31 ENCOUNTER — Ambulatory Visit (HOSPITAL_COMMUNITY)
Admission: RE | Admit: 2018-08-31 | Discharge: 2018-08-31 | Disposition: A | Discharge status: Home / Self Care | Payer: Medicare Other | Source: Ambulatory Visit | Attending: Nurse Practitioner | Admitting: Nurse Practitioner

## 2018-08-31 ENCOUNTER — Encounter (HOSPITAL_COMMUNITY): Payer: Self-pay | Admitting: Nurse Practitioner

## 2018-08-31 ENCOUNTER — Other Ambulatory Visit (HOSPITAL_COMMUNITY): Payer: Self-pay | Admitting: *Deleted

## 2018-08-31 VITALS — BP 126/74 | HR 76 | Ht 66.0 in | Wt 268.0 lb

## 2018-08-31 DIAGNOSIS — I498 Other specified cardiac arrhythmias: Secondary | ICD-10-CM | POA: Insufficient documentation

## 2018-08-31 DIAGNOSIS — M1990 Unspecified osteoarthritis, unspecified site: Secondary | ICD-10-CM | POA: Diagnosis not present

## 2018-08-31 DIAGNOSIS — Z86718 Personal history of other venous thrombosis and embolism: Secondary | ICD-10-CM | POA: Diagnosis not present

## 2018-08-31 DIAGNOSIS — R9431 Abnormal electrocardiogram [ECG] [EKG]: Secondary | ICD-10-CM | POA: Insufficient documentation

## 2018-08-31 DIAGNOSIS — M8580 Other specified disorders of bone density and structure, unspecified site: Secondary | ICD-10-CM | POA: Diagnosis not present

## 2018-08-31 DIAGNOSIS — M19042 Primary osteoarthritis, left hand: Secondary | ICD-10-CM | POA: Insufficient documentation

## 2018-08-31 DIAGNOSIS — Z7901 Long term (current) use of anticoagulants: Secondary | ICD-10-CM | POA: Insufficient documentation

## 2018-08-31 DIAGNOSIS — M109 Gout, unspecified: Secondary | ICD-10-CM | POA: Diagnosis not present

## 2018-08-31 DIAGNOSIS — M19041 Primary osteoarthritis, right hand: Secondary | ICD-10-CM | POA: Diagnosis not present

## 2018-08-31 DIAGNOSIS — G4733 Obstructive sleep apnea (adult) (pediatric): Secondary | ICD-10-CM | POA: Insufficient documentation

## 2018-08-31 DIAGNOSIS — E1140 Type 2 diabetes mellitus with diabetic neuropathy, unspecified: Secondary | ICD-10-CM | POA: Diagnosis not present

## 2018-08-31 DIAGNOSIS — Z888 Allergy status to other drugs, medicaments and biological substances status: Secondary | ICD-10-CM | POA: Insufficient documentation

## 2018-08-31 DIAGNOSIS — E669 Obesity, unspecified: Secondary | ICD-10-CM | POA: Diagnosis not present

## 2018-08-31 DIAGNOSIS — R1011 Right upper quadrant pain: Secondary | ICD-10-CM | POA: Insufficient documentation

## 2018-08-31 DIAGNOSIS — E785 Hyperlipidemia, unspecified: Secondary | ICD-10-CM | POA: Insufficient documentation

## 2018-08-31 DIAGNOSIS — Z885 Allergy status to narcotic agent status: Secondary | ICD-10-CM | POA: Insufficient documentation

## 2018-08-31 DIAGNOSIS — K219 Gastro-esophageal reflux disease without esophagitis: Secondary | ICD-10-CM | POA: Diagnosis not present

## 2018-08-31 DIAGNOSIS — R079 Chest pain, unspecified: Secondary | ICD-10-CM | POA: Diagnosis not present

## 2018-08-31 DIAGNOSIS — I4510 Unspecified right bundle-branch block: Secondary | ICD-10-CM | POA: Insufficient documentation

## 2018-08-31 DIAGNOSIS — D649 Anemia, unspecified: Secondary | ICD-10-CM | POA: Insufficient documentation

## 2018-08-31 DIAGNOSIS — M170 Bilateral primary osteoarthritis of knee: Secondary | ICD-10-CM | POA: Insufficient documentation

## 2018-08-31 DIAGNOSIS — I712 Thoracic aortic aneurysm, without rupture: Secondary | ICD-10-CM | POA: Insufficient documentation

## 2018-08-31 DIAGNOSIS — J90 Pleural effusion, not elsewhere classified: Secondary | ICD-10-CM | POA: Insufficient documentation

## 2018-08-31 DIAGNOSIS — Z8249 Family history of ischemic heart disease and other diseases of the circulatory system: Secondary | ICD-10-CM | POA: Diagnosis not present

## 2018-08-31 DIAGNOSIS — M4690 Unspecified inflammatory spondylopathy, site unspecified: Secondary | ICD-10-CM | POA: Diagnosis not present

## 2018-08-31 DIAGNOSIS — S2231XA Fracture of one rib, right side, initial encounter for closed fracture: Secondary | ICD-10-CM | POA: Diagnosis not present

## 2018-08-31 DIAGNOSIS — I4819 Other persistent atrial fibrillation: Secondary | ICD-10-CM | POA: Diagnosis not present

## 2018-08-31 DIAGNOSIS — Z7984 Long term (current) use of oral hypoglycemic drugs: Secondary | ICD-10-CM | POA: Insufficient documentation

## 2018-08-31 DIAGNOSIS — X58XXXA Exposure to other specified factors, initial encounter: Secondary | ICD-10-CM

## 2018-08-31 DIAGNOSIS — Z96653 Presence of artificial knee joint, bilateral: Secondary | ICD-10-CM | POA: Insufficient documentation

## 2018-08-31 DIAGNOSIS — Z86711 Personal history of pulmonary embolism: Secondary | ICD-10-CM | POA: Insufficient documentation

## 2018-08-31 DIAGNOSIS — I10 Essential (primary) hypertension: Secondary | ICD-10-CM | POA: Diagnosis not present

## 2018-08-31 DIAGNOSIS — Z79899 Other long term (current) drug therapy: Secondary | ICD-10-CM | POA: Insufficient documentation

## 2018-08-31 LAB — BASIC METABOLIC PANEL
Anion gap: 9 (ref 5–15)
BUN: 16 mg/dL (ref 8–23)
CHLORIDE: 102 mmol/L (ref 98–111)
CO2: 26 mmol/L (ref 22–32)
Calcium: 9.8 mg/dL (ref 8.9–10.3)
Creatinine, Ser: 0.65 mg/dL (ref 0.44–1.00)
GFR calc Af Amer: >60 mL/min (ref >60)
GFR calc non Af Amer: >60 mL/min (ref >60)
GLUCOSE: 122 mg/dL — AB (ref 70–99)
POTASSIUM: 4.1 mmol/L (ref 3.5–5.1)
Sodium: 137 mmol/L (ref 135–145)

## 2018-08-31 LAB — MAGNESIUM: MAGNESIUM: 1.8 mg/dL (ref 1.7–2.4)

## 2018-08-31 MED ORDER — DOFETILIDE 250 MCG PO CAPS: 250.0000 ug | ORAL_CAPSULE | Freq: Two times a day (BID) | ORAL | 2 refills | Status: DC

## 2018-08-31 MED ORDER — DOFETILIDE 500 MCG PO CAPS: 500.0000 ug | ORAL_CAPSULE | Freq: Two times a day (BID) | ORAL | 2 refills | Status: DC

## 2018-08-31 NOTE — Progress Notes (Signed)
Primary Care Physician: Josetta Huddle, MD Referring Physician: Same   Natasha Glass is a 77 y.o. 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 Cyndia Bent,  DM II, OSA with CPAP, Arthritis,DVT,Gerd, obesity, anemia, HTN,chronic LLE, that presented to PCP recently  with not feeling well  for a couple of weeks. She was found to be in afib with RVR. Her BB dose was increased and ARB stopped, she was started on xarelto with a chadsvasc score of at least 5.  On initial visit, 6/25,  Ekg showed HR reasonably controlled at 103 bpm. BP soft at 100/58. She  has complaints of fatigue/lightheadedness. She has been able to lose 36 lbs by following a low carb diet over the last several months.Last echo in 2018 with normal EF.  F/u in afib clinic, 7/5. She is tolerating afib reasonably well. We are waiting for 3 weeks of anticoagulation for attempt at cardioversion. Weight is stable. HR controlled at 99 bpm, BP still soft to attempt higher doses of rate control.Walks with a walker but is still able to get out to grocery store and get her groceries in afib. She has chronic LLE but it is stable with longstanding use of lasix.  F/u in afib clinic. She now has had 3 weeks of uninterrupted anticoagulation and will be scheduled for cardioversion. Her weight is stable, down x 3 lbs.  F/u afib clinic, 8/1, s/p cardioversion which was successful.  Remains in SR. She is feeling so much better. She is able to walk and do house duties without walker when in [place]. She is happy for this as she wants to stay in her own home/independent as long as possible.  F/u in afib clinic, 10/8. She saw Dr. Cyndia Bent last week and he noted irregular pulse. Pt had noted that she was was feeling weaker for the last couple of weeks and had to use her walker again which interferes with her autonomy. She is interested in restoring SR with antiarrythmic's.  F/u in afib clinic, 11/5, she is being admitted for Tikosyn. She  feels so much better in SR, having much more stamina and less shortness of breath with exertion. Does not have to use her walker when she is in SR.  QTc today in afib 482 , but in  SR  442 ms. She is applying for pt assistance, no benadryl use, has had 5 therapeutic INR's, last one this am at 2.4.   F/u 11/18, she is here to f/u Tikosyn admit. She is in SR and has more energy. She did not get her lasix while in the hospital despite asking for same and diuresed 8 lbs since she was d/c'ed, now back on lasix.. She is back to her usual weight. She has also been having discomfort in her upper rt quadrant since Friday. No N/V, fever/chills. She has been expectorating whitish sputum. She is questioning pneumonia, but clinically does not seem to fit the picture. She is tender rt ant/post URQ. Last  CT of the chest, 07/16/18, did show cholelithiasis . She also heard a " pop " in her chest bending over to pick up a [place] breast in the grocery store this weekend, she was also questioning a broken rib.  Today, she denies symptoms of palpitations, chest pain, shortness of breath, orthopnea, PND, lower extremity edema, dizziness, presyncope, syncope, or neurologic sequela. + RUQ tenderness.The patient is tolerating medications without difficulties and is otherwise without complaint today.   Past Medical  History:  Diagnosis Date  . Anemia   . Arthritis    "legs, hands, knees, back" (04/10/2016)  . Atrial fibrillation ([place]) 08/2018  . DVT (deep venous thrombosis) ([place]) 01/2009  . Dyspnea    due to weight   . GERD (gastroesophageal reflux disease)    occ.  . Gout   . Headache    "at least weekly" (04/10/2016)  . History of blood transfusion 1990s   "related to ulcerative colitis"  . History of colonic polyps 09/2010  . Hyperlipidemia   . Hypertension   . Lactose intolerance   . Macular degeneration, dry    bilateral  . Migraine    "q 2-3 months" (04/10/2016)  . Obesity   . Osteopenia   . Peripheral  neuropathy   . Pneumonia 1970s  . PONV (postoperative nausea and vomiting)    hx of years ago none recent   . Pulmonary embolism ([place]) 01/2009  . Shortness of breath on exertion   . Sleep apnea    cpap pressure 4.0-16.0   . Type II diabetes mellitus ([place])    type II   . 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 Urinary incontinence    wears peripad   Past Surgical History:  Procedure Laterality Date  . ABDOMINAL EXPLORATION SURGERY  1960  . ABDOMINAL HYSTERECTOMY  1986  . CARDIAC CATHETERIZATION    . CARDIOVERSION N/A 05/07/2018   Procedure: CARDIOVERSION;  Surgeon: Josue Hector, MD;  Location: [facility] ENDOSCOPY;  Service: Cardiovascular;  Laterality: N/A;  . COLONOSCOPY W/ BIOPSIES AND POLYPECTOMY  2005; 2011; 2012  . COLONOSCOPY WITH PROPOFOL N/A 09/10/2016   Procedure: COLONOSCOPY WITH PROPOFOL;  Surgeon: Garlan Fair, MD;  Location: [facility] ENDOSCOPY;  Service: Endoscopy;  Laterality: N/A;  . JOINT REPLACEMENT    . KNEE ARTHROSCOPY Left ~ 1999  . SHOULDER OPEN ROTATOR CUFF REPAIR Left 2011  . TOTAL KNEE ARTHROPLASTY Bilateral 2005-2012   left-right    Current Outpatient Medications  Medication Sig Dispense Refill  . allopurinol (ZYLOPRIM) 300 MG tablet Take 150 mg by mouth every evening.     . Cholecalciferol (VITAMIN D) 2000 UNITS tablet Take 2,000 Units by mouth every morning.     . dofetilide (TIKOSYN) 500 MCG capsule Take 1 capsule (500 mcg total) by mouth 2 (two) times daily. 180 capsule 2  . furosemide (LASIX) 40 MG tablet Take 40 mg by mouth daily.     . magnesium oxide (MAG-OX) 400 (241.3 Mg) MG tablet Take 1 tablet (400 mg total) by mouth 2 (two) times daily. 60 tablet 6  . metFORMIN (GLUCOPHAGE) 500 MG tablet Take 500 mg by mouth 2 (two) times daily with a meal.     . metoprolol tartrate (LOPRESSOR) 25 MG tablet Take 25-50 mg by mouth See admin instructions. Take 2 tablets in the morning, 1 tablet at lunch and 2 tablets in the evening.    . Multiple  Vitamins-Minerals (PRESERVISION AREDS 2) CAPS Take 2 capsules by mouth daily.    . Naphazoline HCl (CLEAR EYES OP) Place 2 drops into both eyes 2 (two) times daily as needed (for dry eyes).    Marland Kitchen omeprazole (PRILOSEC) 20 MG capsule Take 1 capsule (20 mg total) by mouth daily. (Patient taking differently: Take 20 mg by mouth as needed (for acid reflux). ) 20 capsule 0  . potassium chloride SA (K-DUR,KLOR-CON) 20 MEQ tablet Take 1.5 tablets (30 mEq total) by mouth daily. 30 tablet 6  . warfarin (COUMADIN) 5 MG tablet  Take 5 mg by mouth daily.      No current facility-administered medications for this encounter.     Allergies  Allergen Reactions  . Morphine And Related Other (See Comments)    Unresponsive-Very bad reaction.  Can take hydrocodone.   [name] Allergy] Shortness Of Breath, Itching, Swelling and Rash  . Amlodipine Swelling and Other (See Comments)    Of legs  . Percocet [Oxycodone-Acetaminophen] Other (See Comments)    hallucinations  . Tdap [Tetanus-Diphth-Acell Pertussis] Itching, Swelling and Rash  . Tetanus Toxoids Itching, Swelling and Rash  . Tramadol Swelling and Other (See Comments)    Of legs  . Lactose Intolerance (Gi)   . Diovan [Valsartan] Other (See Comments)    Cough  . Diphtheria Toxoid Itching, Swelling and Rash  . Macrodantin [Nitrofurantoin Macrocrystal] Other (See Comments)    unspecified    Social History   Socioeconomic History  . Marital status: Widowed    Spouse name: Not on file  . Number of children: Not on file  . Years of education: Not on file  . Highest education level: Not on file  Occupational History  . Not on file  Social Needs  . Financial resource strain: Not on file  . Food insecurity:    Worry: Not on file    Inability: Not on file  . Transportation needs:    Medical: Not on file    Non-medical: Not on file  Tobacco Use  . Smoking status: Never Smoker  . Smokeless tobacco: Never Used  Substance and Sexual  Activity  . Alcohol use: No  . Drug use: No  . Sexual activity: Not Currently  Lifestyle  . Physical activity:    Days per week: Not on file    Minutes per session: Not on file  . Stress: Not on file  Relationships  . Social connections:    Talks on phone: Not on file    Gets together: Not on file    Attends religious service: Not on file    Active member of club or organization: Not on file    Attends meetings of clubs or organizations: Not on file    Relationship status: Not on file  . Intimate partner violence:    Fear of current or ex partner: Not on file    Emotionally abused: Not on file    Physically abused: Not on file    Forced sexual activity: Not on file  Other Topics Concern  . Not on file  Social History Narrative  . Not on file    Family History  Problem Relation Age of Onset  . Colon cancer Mother   . Lung cancer Mother   . Heart disease Father     ROS- All systems are reviewed and negative except as per the HPI above  Physical Exam: Vitals:   08/31/18 1127  BP: 126/74  Pulse: 76  SpO2: 96%  Weight: 121.6 kg  Height: 5' 6"  (1.676 m)   Wt Readings from Last 3 Encounters:  08/31/18 121.6 kg  08/21/18 123.4 kg  08/18/18 122.6 kg    Labs: Lab Results  Component Value Date   NA 137 08/31/2018   K 4.1 08/31/2018   CL 102 08/31/2018   CO2 26 08/31/2018   GLUCOSE 122 (H) 08/31/2018   BUN 16 08/31/2018   CREATININE 0.65 08/31/2018   CALCIUM 9.8 08/31/2018   MG 1.8 08/31/2018   Lab Results  Component Value Date   INR 2.61  08/21/2018   Lab Results  Component Value Date   CHOL  02/07/2009    136        ATP III CLASSIFICATION:  <200     mg/dL   Desirable  200-239  mg/dL   Borderline High  >=240    mg/dL   High          HDL 45 02/07/2009   LDLCALC  02/07/2009    74        Total Cholesterol/HDL:CHD Risk Coronary Heart Disease Risk Table                     Men   Women  1/2 Average Risk   3.4   3.3  Average Risk       5.0   4.4  2 X  Average Risk   9.6   7.1  3 X Average Risk  23.4   11.0        Use the calculated Patient Ratio above and the CHD Risk Table to determine the patient's CHD Risk.        ATP III CLASSIFICATION (LDL):  <100     mg/dL   Optimal  100-129  mg/dL   Near or Above                    Optimal  130-159  mg/dL   Borderline  160-189  mg/dL   High  >190     mg/dL   Very High   TRIG 84 02/07/2009     GEN- The patient is well appearing, alert and oriented x 3 today.   Head- normocephalic, atraumatic Eyes-  Sclera clear, conjunctiva pink Ears- hearing intact Oropharynx- clear Neck- supple, no JVP Lymph- no cervical lymphadenopathy Lungs- Clear to ausculation bilaterally, normal work of breathing Heart-irregular rate and rhythm, no murmurs, rubs or gallops, PMI not laterally displaced Abd- tender RUQ, no rash noted  Extremities- no clubbing, cyanosis, or edema MS- no significant deformity or atrophy Skin- no rash or lesion Psych- euthymic mood, full affect Neuro- strength and sensation are intact  EKG-SR at 76 ms, qrs int 206 ms, qtc long at  524 ms, reviewed by Dr. Rayann Heman and he feels Tikosyn needs to be reduced to 250 mcg bid for long qtc. Echo-Study Conclusions  - Left ventricle: The cavity size was normal. Systolic function was   normal. The estimated ejection fraction was in the range of 55%   to 60%. Wall motion was normal; there were no regional wall   motion abnormalities. Left ventricular diastolic function   parameters were normal. - Mitral valve: Calcified annulus. - Left atrium: The atrium was mildly dilated. - Right atrium: The atrium was mildly dilated.    Assessment and Plan:  1. Persistent  afib S/p successful cardioversion with improvement  in energy and shortness of breath, unfortunately  returned to afib with reduction in energy and ability to perform daily activities Successfully loaded on tikosyn Feels improved Tikosyn precautions discussed  Bmet/mag today    No benadryl use  2. Chadsvasc score of at least 5 Continue coumadin  3. RUQ pain ? etiology Will obtain CXR   Addendum: CXR showed new Rt 7th rib fracture, xray forwarded to PCP  F/u in afib clinic in one week for repeat EKG for f/u qtc  F/u with Dr. Rayann Heman 12/9 To see PCP at 2pm today for RUQ pain   Butch Penny C. Carroll, [facility] [address]  [address] [place], [place] [address] [phone]

## 2018-09-08 ENCOUNTER — Encounter (HOSPITAL_COMMUNITY): Payer: Self-pay | Admitting: Nurse Practitioner

## 2018-09-08 ENCOUNTER — Ambulatory Visit (HOSPITAL_COMMUNITY)
Admission: RE | Admit: 2018-09-08 | Discharge: 2018-09-08 | Disposition: A | Discharge status: Home / Self Care | Payer: Medicare Other | Source: Ambulatory Visit | Attending: Nurse Practitioner | Admitting: Nurse Practitioner

## 2018-09-08 DIAGNOSIS — I4891 Unspecified atrial fibrillation: Secondary | ICD-10-CM | POA: Diagnosis not present

## 2018-09-08 DIAGNOSIS — I4510 Unspecified right bundle-branch block: Secondary | ICD-10-CM | POA: Diagnosis not present

## 2018-09-08 NOTE — Progress Notes (Signed)
Pt in for EKG after decreasing Tikosyn.  To be reviewed by Roderic Palau, NP. HR 65

## 2018-09-08 NOTE — Progress Notes (Signed)
Pt in for EKG with reduced dose of Tikosyn to 250 mcg bid  from 500 mcg bid with qtc appearing long on last visit. Today Qtc improved at 468 ms. She is in SR with first degree AV block at 65 bpm, pr int 216 ms, qrs int 96 ms. Her PCP  recently reduced BB dose. She will f/u with Dr. Rayann Heman in 2-3 weeks.

## 2018-09-14 ENCOUNTER — Encounter: Payer: Self-pay | Admitting: Internal Medicine

## 2018-09-21 ENCOUNTER — Other Ambulatory Visit: Payer: Self-pay | Admitting: Nurse Practitioner

## 2018-09-21 ENCOUNTER — Ambulatory Visit
Admission: RE | Admit: 2018-09-21 | Discharge: 2018-09-21 | Disposition: A | Discharge status: Home / Self Care | Payer: Medicare Other | Source: Ambulatory Visit | Attending: Nurse Practitioner | Admitting: Nurse Practitioner

## 2018-09-21 ENCOUNTER — Encounter: Payer: Self-pay | Admitting: Internal Medicine

## 2018-09-21 ENCOUNTER — Ambulatory Visit: Payer: Medicare Other | Admitting: Internal Medicine

## 2018-09-21 VITALS — BP 110/78 | HR 66 | Ht 66.0 in | Wt 266.0 lb

## 2018-09-21 DIAGNOSIS — I4819 Other persistent atrial fibrillation: Secondary | ICD-10-CM

## 2018-09-21 DIAGNOSIS — M7541 Impingement syndrome of right shoulder: Secondary | ICD-10-CM

## 2018-09-21 DIAGNOSIS — R0781 Pleurodynia: Secondary | ICD-10-CM
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
## 2018-09-21 NOTE — Patient Instructions (Addendum)
  Medication Instructions:  Your physician recommends that you continue on your current medications as directed. Please refer to the Current Medication list given to you today.  Labwork: None ordered.  Testing/Procedures: None ordered.  Follow-Up: Your physician recommends that you schedule a follow-up appointment in:   3 months with Roderic Palau  6 months with Dr Rayann Heman  Any Other Special Instructions Will Be Listed Below (If Applicable).     If you need a refill on your cardiac medications before your next appointment, please call your pharmacy.

## 2018-09-22 DIAGNOSIS — M25511 Pain in right shoulder: Secondary | ICD-10-CM | POA: Insufficient documentation

## 2018-09-29 ENCOUNTER — Other Ambulatory Visit: Payer: Self-pay | Admitting: Internal Medicine

## 2018-09-29 DIAGNOSIS — M8580 Other specified disorders of bone density and structure, unspecified site: Secondary | ICD-10-CM

## 2018-09-29 DIAGNOSIS — M810 Age-related osteoporosis without current pathological fracture: Secondary | ICD-10-CM

## 2018-11-19 ENCOUNTER — Other Ambulatory Visit: Payer: Medicare Other

## 2018-11-27 ENCOUNTER — Encounter (INDEPENDENT_AMBULATORY_CARE_PROVIDER_SITE_OTHER): Payer: Medicare Other | Admitting: Ophthalmology

## 2018-11-27 DIAGNOSIS — H35033 Hypertensive retinopathy, bilateral: Secondary | ICD-10-CM | POA: Diagnosis not present

## 2018-11-27 DIAGNOSIS — H43813 Vitreous degeneration, bilateral: Secondary | ICD-10-CM | POA: Diagnosis not present

## 2018-11-27 DIAGNOSIS — I10 Essential (primary) hypertension: Secondary | ICD-10-CM | POA: Diagnosis not present

## 2018-11-27 DIAGNOSIS — H2513 Age-related nuclear cataract, bilateral: Secondary | ICD-10-CM

## 2018-11-27 DIAGNOSIS — H353132 Nonexudative age-related macular degeneration, bilateral, intermediate dry stage: Secondary | ICD-10-CM

## 2018-12-02 ENCOUNTER — Other Ambulatory Visit: Payer: Medicare Other

## 2018-12-10 ENCOUNTER — Ambulatory Visit
Admission: RE | Admit: 2018-12-10 | Discharge: 2018-12-10 | Disposition: A | Discharge status: Home / Self Care | Payer: Medicare Other | Source: Ambulatory Visit | Attending: Internal Medicine | Admitting: Internal Medicine

## 2018-12-10 DIAGNOSIS — M8580 Other specified disorders of bone density and structure, unspecified site: Secondary | ICD-10-CM

## 2018-12-10 IMAGING — DX DG CHEST 2V
2 series · 2 of 2 positions shown · non-contrast
Comparison: Chest radiograph 04/10/2017 and CTA 07/16/2018

CLINICAL DATA: Chest and flank pain. History of atrial
fibrillation.

EXAM:
CHEST - 2 VIEW

[chest pa]
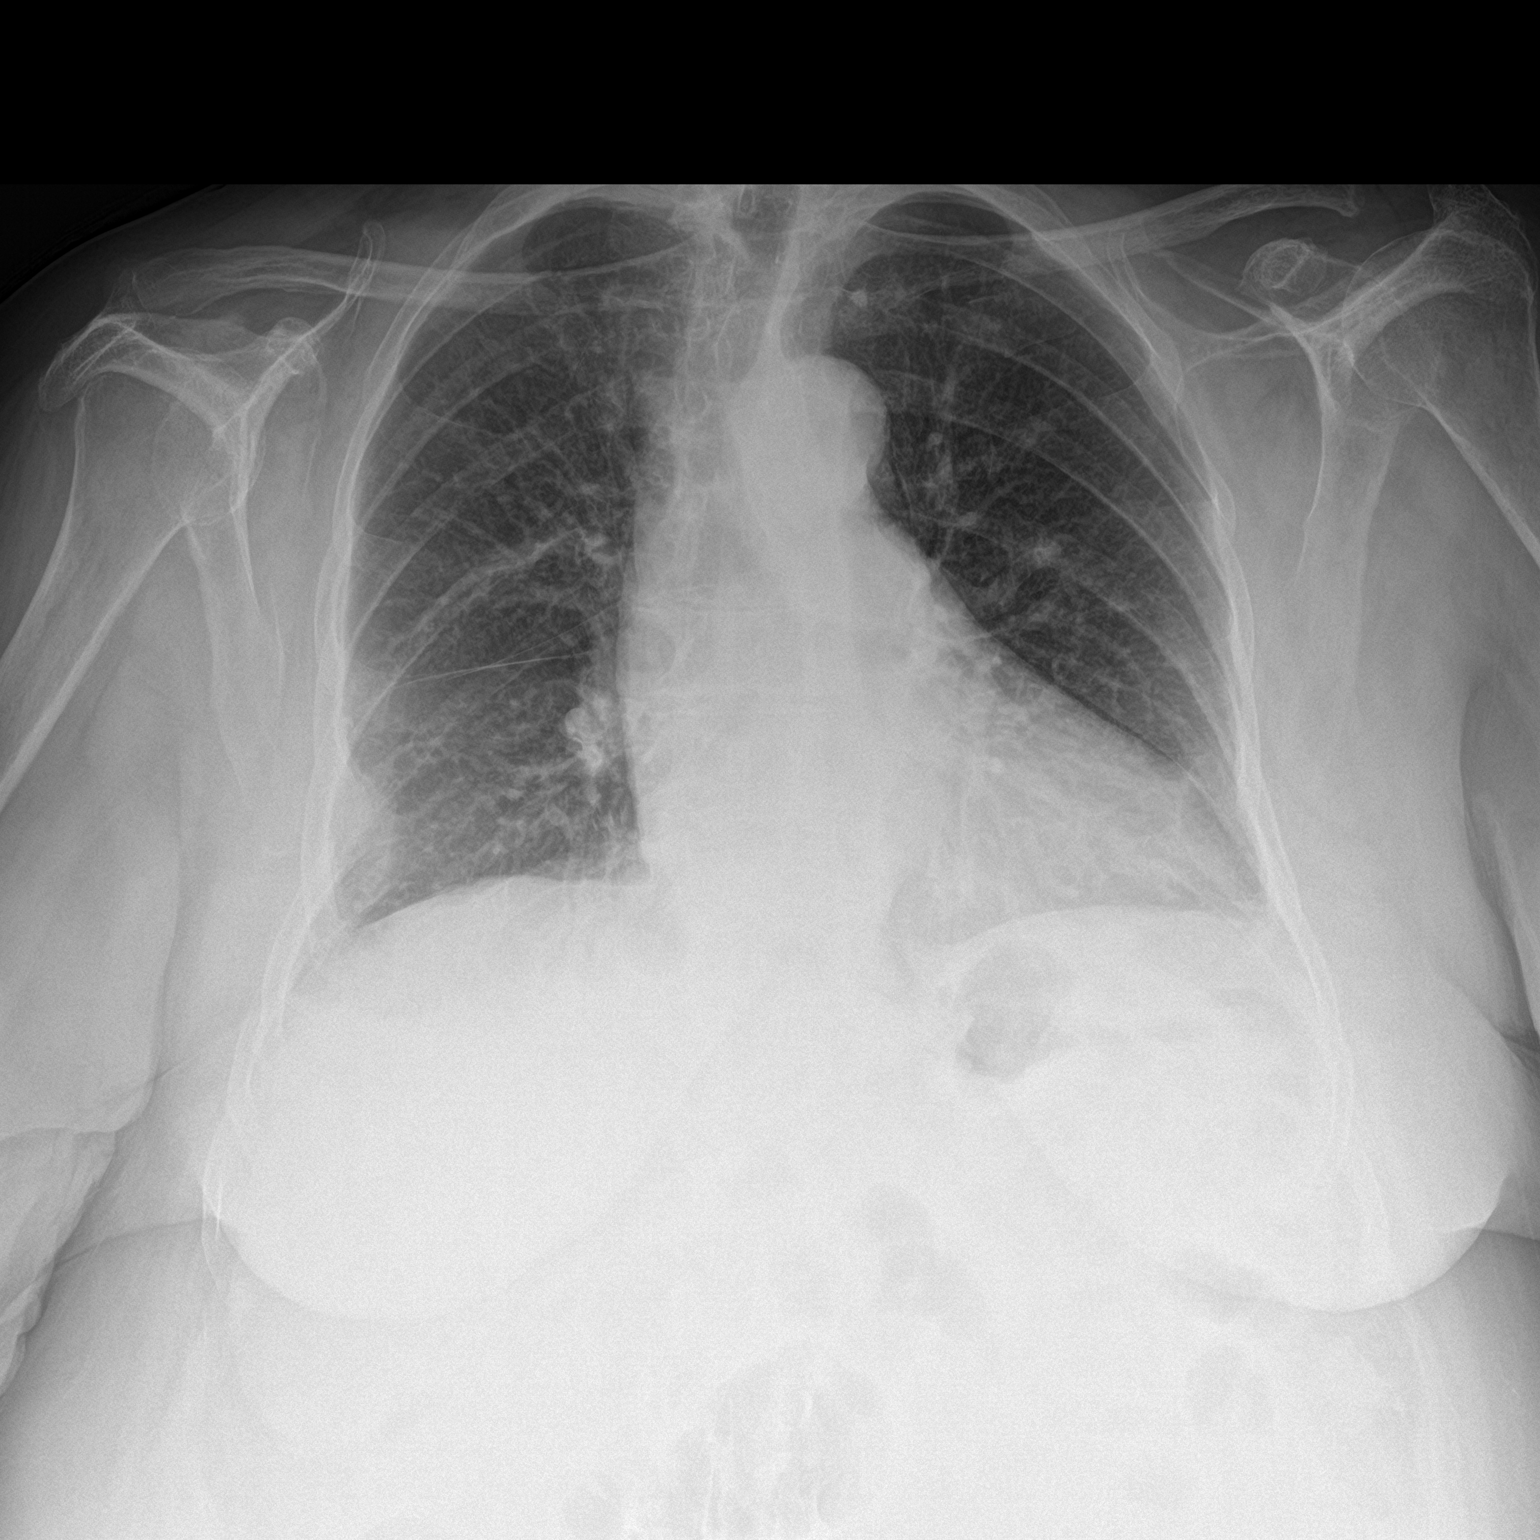

[chest lat]
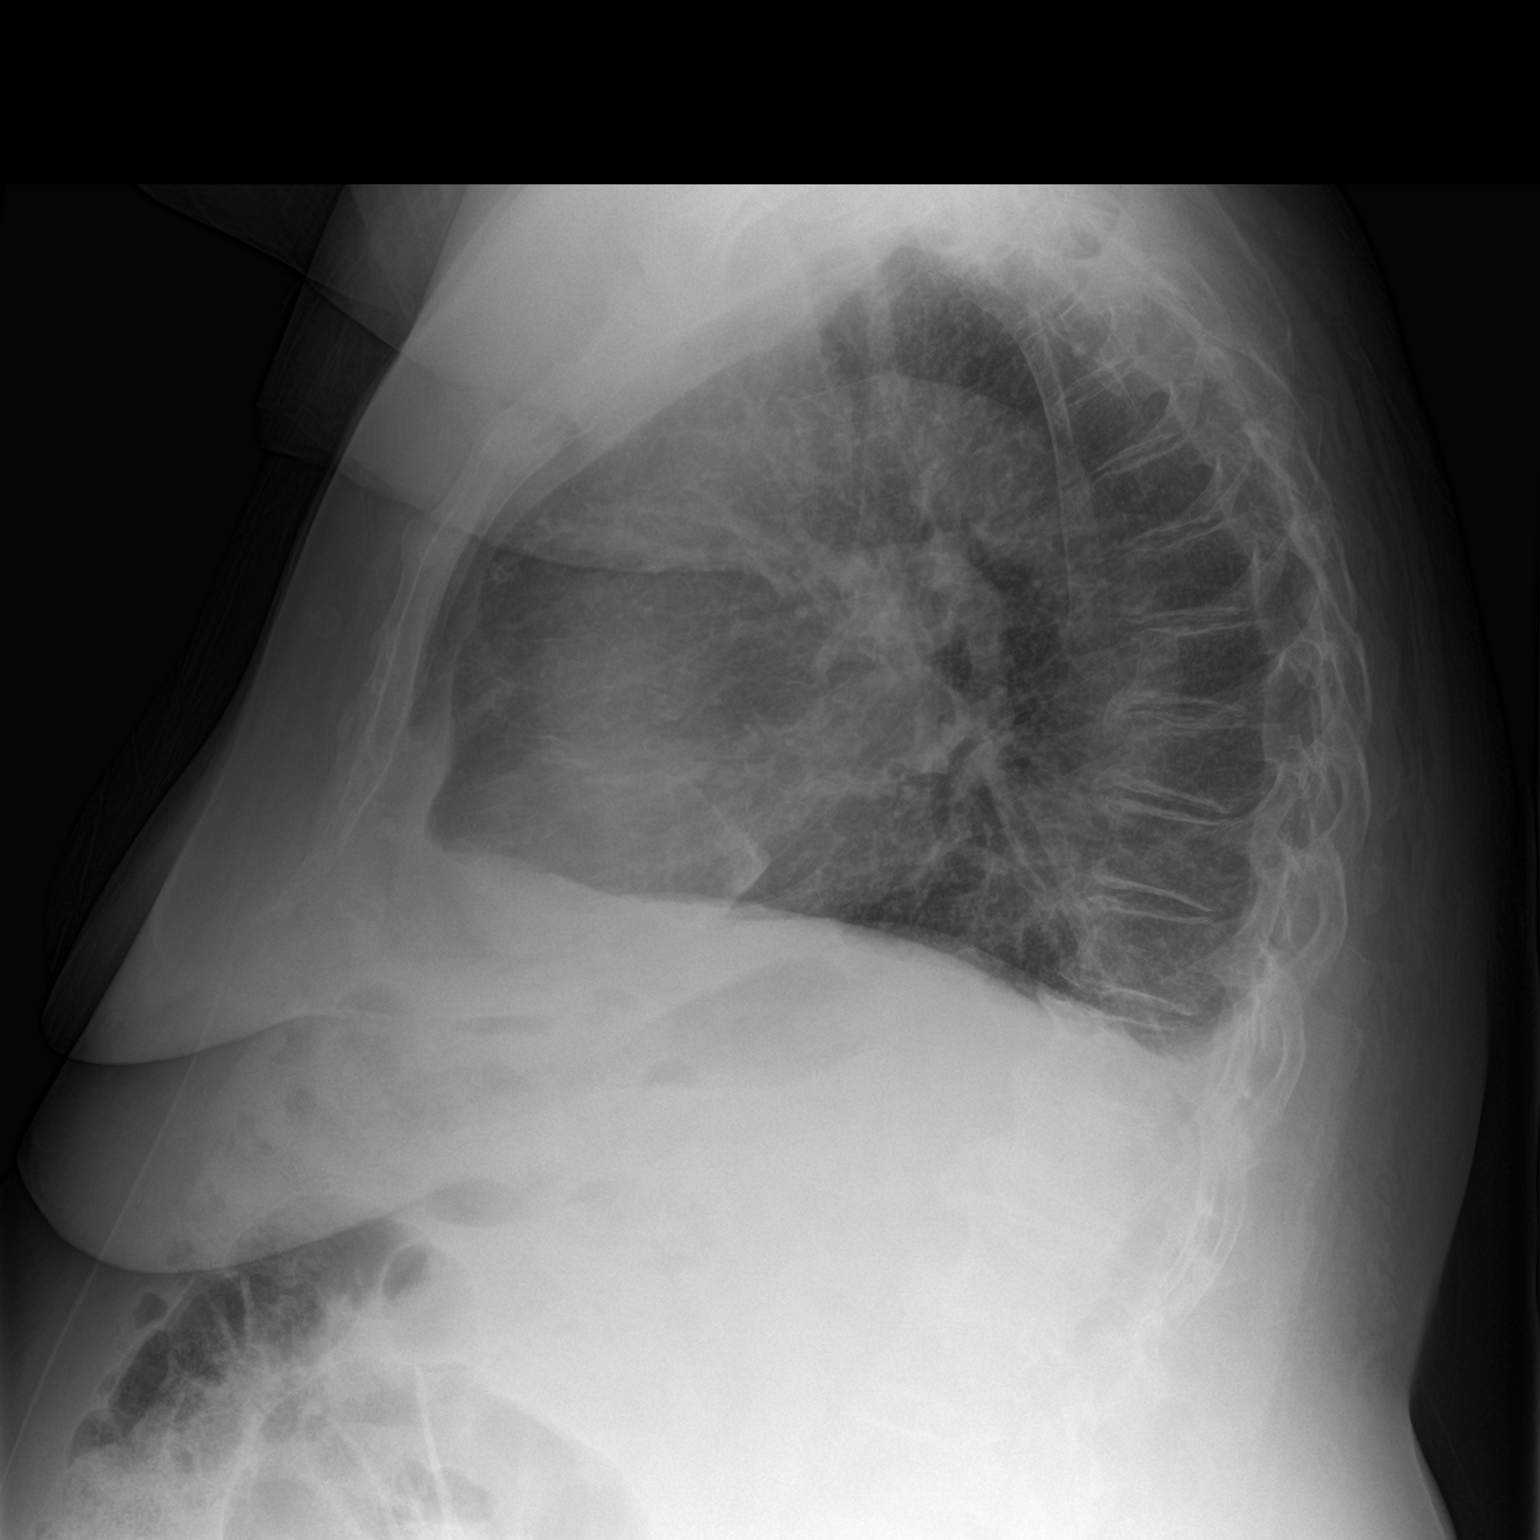

[2 of 2 positions shown; findings below may reference images not displayed]

FINDINGS: The cardiac silhouette is within normal limits in size. The thoracic
aorta is tortuous. There is chronic interstitial coarsening. Focal
density in the lateral right lung base appears to be predominantly
related to a new mildly displaced posterior seventh rib fracture,
possibly with some surrounding callus formation and mild
atelectasis. Blunting of the posterior costophrenic angles suggests
trace pleural effusions. No pneumothorax is identified.
IMPRESSION: 1. New right posterior seventh rib fracture.  No pneumothorax.
2. Trace pleural effusions.

## 2018-12-22 ENCOUNTER — Encounter (HOSPITAL_COMMUNITY): Payer: Self-pay | Admitting: Nurse Practitioner

## 2018-12-22 ENCOUNTER — Ambulatory Visit (HOSPITAL_COMMUNITY)
Admission: RE | Admit: 2018-12-22 | Discharge: 2018-12-22 | Disposition: A | Discharge status: Home / Self Care | Payer: Medicare Other | Source: Ambulatory Visit | Attending: Nurse Practitioner | Admitting: Nurse Practitioner

## 2018-12-22 VITALS — BP 136/92 | HR 60 | Ht 66.0 in | Wt 273.0 lb

## 2018-12-22 DIAGNOSIS — K219 Gastro-esophageal reflux disease without esophagitis: Secondary | ICD-10-CM | POA: Diagnosis not present

## 2018-12-22 DIAGNOSIS — R32 Unspecified urinary incontinence: Secondary | ICD-10-CM | POA: Diagnosis not present

## 2018-12-22 DIAGNOSIS — R42 Dizziness and giddiness: Secondary | ICD-10-CM | POA: Insufficient documentation

## 2018-12-22 DIAGNOSIS — Z79899 Other long term (current) drug therapy: Secondary | ICD-10-CM | POA: Insufficient documentation

## 2018-12-22 DIAGNOSIS — M8580 Other specified disorders of bone density and structure, unspecified site: Secondary | ICD-10-CM | POA: Insufficient documentation

## 2018-12-22 DIAGNOSIS — D649 Anemia, unspecified: Secondary | ICD-10-CM | POA: Diagnosis not present

## 2018-12-22 DIAGNOSIS — H353130 Nonexudative age-related macular degeneration, bilateral, stage unspecified: Secondary | ICD-10-CM | POA: Insufficient documentation

## 2018-12-22 DIAGNOSIS — E118 Type 2 diabetes mellitus with unspecified complications: Secondary | ICD-10-CM | POA: Insufficient documentation

## 2018-12-22 DIAGNOSIS — I4819 Other persistent atrial fibrillation: Secondary | ICD-10-CM | POA: Diagnosis not present

## 2018-12-22 DIAGNOSIS — I712 Thoracic aortic aneurysm, without rupture: Secondary | ICD-10-CM | POA: Insufficient documentation

## 2018-12-22 DIAGNOSIS — G629 Polyneuropathy, unspecified: Secondary | ICD-10-CM | POA: Diagnosis not present

## 2018-12-22 DIAGNOSIS — E785 Hyperlipidemia, unspecified: Secondary | ICD-10-CM | POA: Insufficient documentation

## 2018-12-22 DIAGNOSIS — M109 Gout, unspecified: Secondary | ICD-10-CM | POA: Insufficient documentation

## 2018-12-22 DIAGNOSIS — G4733 Obstructive sleep apnea (adult) (pediatric): Secondary | ICD-10-CM | POA: Diagnosis not present

## 2018-12-22 DIAGNOSIS — Z7901 Long term (current) use of anticoagulants: Secondary | ICD-10-CM | POA: Diagnosis not present

## 2018-12-22 DIAGNOSIS — R5383 Other fatigue: Secondary | ICD-10-CM | POA: Diagnosis not present

## 2018-12-22 DIAGNOSIS — I10 Essential (primary) hypertension: Secondary | ICD-10-CM | POA: Diagnosis not present

## 2018-12-22 DIAGNOSIS — M1990 Unspecified osteoarthritis, unspecified site: Secondary | ICD-10-CM | POA: Diagnosis not present

## 2018-12-22 LAB — BASIC METABOLIC PANEL
ANION GAP: 11 (ref 5–15)
BUN: 25 mg/dL — ABNORMAL HIGH (ref 8–23)
CHLORIDE: 106 mmol/L (ref 98–111)
CO2: 24 mmol/L (ref 22–32)
CREATININE: 0.76 mg/dL (ref 0.44–1.00)
Calcium: 9.8 mg/dL (ref 8.9–10.3)
GFR calc non Af Amer: >60 mL/min (ref >60)
Glucose, Bld: 118 mg/dL — ABNORMAL HIGH (ref 70–99)
Potassium: 4.2 mmol/L (ref 3.5–5.1)
SODIUM: 141 mmol/L (ref 135–145)

## 2018-12-22 LAB — MAGNESIUM: MAGNESIUM: 1.8 mg/dL (ref 1.7–2.4)
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
## 2019-03-18 ENCOUNTER — Telehealth: Payer: Self-pay

## 2019-03-19 NOTE — Telephone Encounter (Signed)
Left message regarding appt on 03/22/19.

## 2019-03-22 ENCOUNTER — Telehealth (INDEPENDENT_AMBULATORY_CARE_PROVIDER_SITE_OTHER): Payer: Medicare Other | Admitting: Internal Medicine

## 2019-03-22 ENCOUNTER — Encounter: Payer: Self-pay | Admitting: Internal Medicine

## 2019-03-22 VITALS — BP 123/78 | HR 78 | Ht 63.0 in | Wt 273.0 lb

## 2019-03-22 DIAGNOSIS — I4819 Other persistent atrial fibrillation: Secondary | ICD-10-CM

## 2019-03-22 DIAGNOSIS — G4733 Obstructive sleep apnea (adult) (pediatric): Secondary | ICD-10-CM | POA: Diagnosis not present

## 2019-03-22 NOTE — Progress Notes (Signed)
Electrophysiology TeleHealth Note   Due to national recommendations of social distancing due to [name], an audio  telehealth visit is felt to be most appropriate for this patient at this time.  See MyChart message from today for the patient's consent to telehealth for [facility].  She was unable to perform virtual visit today.  We therefore performed a phone visit.  Date:  03/22/2019   ID:  Natasha Glass, DOB March 27, 1941, MRN [id number]  Location: patient's home  Provider location: [place]  Evaluation Performed: Follow-up visit  PCP:  Josetta Huddle, MD   Electrophysiologist:  Dr Rayann Heman  Chief Complaint:  afib  History of Present Illness:    Natasha Glass is a 78 y.o. female who presents via audio conferencing for a telehealth visit today.  Since last being seen in our clinic, the patient reports doing very well.  Today, she denies symptoms of palpitations, chest pain, shortness of breath,  lower extremity edema, dizziness, presyncope, or syncope.  The patient is otherwise without complaint today.  The patient denies symptoms of fevers, chills, cough, or new SOB worrisome for COVID 19.  Past Medical History:  Diagnosis Date  . Anemia   . Arthritis    "legs, hands, knees, back" (04/10/2016)  . Atrial fibrillation ([place]) 08/2018  . DVT (deep venous thrombosis) ([place]) 01/2009  . Dyspnea    due to weight   . GERD (gastroesophageal reflux disease)    occ.  . Gout   . Headache    "at least weekly" (04/10/2016)  . History of blood transfusion 1990s   "related to ulcerative colitis"  . History of colonic polyps 09/2010  . Hyperlipidemia   . Hypertension   . Lactose intolerance   . Macular degeneration, dry    bilateral  . Migraine    "q 2-3 months" (04/10/2016)  . Obesity   . Osteopenia   . Peripheral neuropathy   . Pneumonia 1970s  . PONV (postoperative nausea and vomiting)    hx of years ago none recent   . Pulmonary embolism ([place]) 01/2009  . Shortness of  breath on exertion   . Sleep apnea    cpap pressure 4.0-16.0   . Type II diabetes mellitus ([place])    type II   . 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 Urinary incontinence    wears peripad    Past Surgical History:  Procedure Laterality Date  . ABDOMINAL EXPLORATION SURGERY  1960  . ABDOMINAL HYSTERECTOMY  1986  . CARDIAC CATHETERIZATION    . CARDIOVERSION N/A 05/07/2018   Procedure: CARDIOVERSION;  Surgeon: Josue Hector, MD;  Location: [facility] ENDOSCOPY;  Service: Cardiovascular;  Laterality: N/A;  . COLONOSCOPY W/ BIOPSIES AND POLYPECTOMY  2005; 2011; 2012  . COLONOSCOPY WITH PROPOFOL N/A 09/10/2016   Procedure: COLONOSCOPY WITH PROPOFOL;  Surgeon: Garlan Fair, MD;  Location: [facility] ENDOSCOPY;  Service: Endoscopy;  Laterality: N/A;  . JOINT REPLACEMENT    . KNEE ARTHROSCOPY Left ~ 1999  . SHOULDER OPEN ROTATOR CUFF REPAIR Left 2011  . TOTAL KNEE ARTHROPLASTY Bilateral 2005-2012   left-right    Current Outpatient Medications  Medication Sig Dispense Refill  . allopurinol (ZYLOPRIM) 300 MG tablet Take 150 mg by mouth every evening.     . dofetilide (TIKOSYN) 250 MCG capsule Take 1 capsule (250 mcg total) by mouth 2 (two) times daily. 180 capsule 2  . furosemide (LASIX) 40 MG tablet Take 40 mg by mouth daily.     Marland Kitchen  losartan (COZAAR) 25 MG tablet Take 25 mg by mouth daily.    . magnesium oxide (MAG-OX) 400 (241.3 Mg) MG tablet Take 1 tablet (400 mg total) by mouth 2 (two) times daily. 60 tablet 6  . metFORMIN (GLUCOPHAGE) 500 MG tablet Take 500 mg by mouth 2 (two) times daily with a meal.     . metoprolol tartrate (LOPRESSOR) 25 MG tablet Take 25-50 mg by mouth See admin instructions. Take 2 tablets in the morning, and 2 tablets in the evening.    . Multiple Vitamins-Minerals (PRESERVISION AREDS 2) CAPS Take 2 capsules by mouth daily.    . Naphazoline HCl (CLEAR EYES OP) Place 2 drops into both eyes 2 (two) times daily as needed (for dry eyes).    Marland Kitchen omeprazole (PRILOSEC)  20 MG capsule Take 20 mg by mouth as needed.    . potassium chloride SA (K-DUR,KLOR-CON) 20 MEQ tablet Take 1.5 tablets (30 mEq total) by mouth daily. 30 tablet 6  . VITAMIN D PO Take 3,000 Units by mouth daily.    Marland Kitchen warfarin (COUMADIN) 5 MG tablet Take 5 mg by mouth daily.      No current facility-administered medications for this visit.     Allergies:   Morphine and related; Oysters [shellfish allergy]; Amlodipine; Percocet [oxycodone-acetaminophen]; Tdap [tetanus-diphth-acell pertussis]; Tetanus toxoids; Tramadol; Lactose intolerance (gi); Diovan [valsartan]; Diphtheria toxoid; and Macrodantin [nitrofurantoin macrocrystal]   Social History:  The patient  reports that she has never smoked. She has never used smokeless tobacco. She reports that she does not drink alcohol or use drugs.   Family History:  The patient's  family history includes Colon cancer in her mother; Heart disease in her father; Lung cancer in her mother.   ROS:  Please see the history of present illness.   All other systems are personally reviewed and negative.    Exam:    Vital Signs:  BP 123/78   Pulse 78   Ht 5' 3"  (1.6 m)   Wt 273 lb (123.8 kg)   BMI 48.36 kg/m   Well sounding   Labs/Other Tests and Data Reviewed:    Recent Labs: 04/29/2018: Platelets 139 05/07/2018: Hemoglobin 15.0 12/22/2018: BUN 25; Creatinine, Ser 0.76; Magnesium 1.8; Potassium 4.2; Sodium 141   Wt Readings from Last 3 Encounters:  03/22/19 273 lb (123.8 kg)  12/22/18 273 lb (123.8 kg)  09/21/18 266 lb (120.7 kg)     Other studies personally reviewed: Additional studies/ records that were reviewed today include: my prior notes,  AF clinic notes  Review of the above records today demonstrates: as above   ekg 12/22/2018- sinus with PACs, QTc 450 msec  ASSESSMENT & PLAN:    1.  Persistent afib Doing great with tikosyn chads2vasc score is 5.  She is on coumadin  2. HTN Stable No change required today  3. Obesity  Lifestyle modification is encouraged  4. OSA  She is using CPAP  5. COVID 19 screen The patient denies symptoms of COVID 19 at this time.  The importance of social distancing was discussed today.  Follow-up:  6 months with AF clinic  Patient Risk:  after full review of this patients clinical status, I feel that they are at moderate risk at this time.  Today, I have spent 15 minutes with the patient with telehealth technology discussing afib .    Army Fossa, MD  03/22/2019 11:45 AM     [facility] [address] [place] [address] (  [phone] (office) [phone] (fax)

## 2019-04-22 ENCOUNTER — Other Ambulatory Visit (HOSPITAL_COMMUNITY): Payer: Self-pay | Admitting: Nurse Practitioner

## 2019-04-22 ENCOUNTER — Other Ambulatory Visit (HOSPITAL_COMMUNITY): Payer: Self-pay | Admitting: *Deleted

## 2019-04-22 MED ORDER — DOFETILIDE 250 MCG PO CAPS: 250.0000 ug | ORAL_CAPSULE | Freq: Two times a day (BID) | ORAL | 3 refills | Status: DC

## 2019-06-23 ENCOUNTER — Other Ambulatory Visit: Payer: Self-pay | Admitting: Surgery

## 2019-06-23 DIAGNOSIS — I712 Thoracic aortic aneurysm, without rupture, unspecified: Secondary | ICD-10-CM

## 2019-06-23 NOTE — Progress Notes (Unsigned)
ct 

## 2019-07-28 ENCOUNTER — Ambulatory Visit
Admission: RE | Admit: 2019-07-28 | Discharge: 2019-07-28 | Disposition: A | Discharge status: Home / Self Care | Payer: Medicare Other | Source: Ambulatory Visit | Attending: Surgery | Admitting: Surgery

## 2019-07-28 ENCOUNTER — Ambulatory Visit: Payer: Medicare Other | Admitting: Surgery

## 2019-07-28 DIAGNOSIS — I712 Thoracic aortic aneurysm, without rupture, unspecified: Secondary | ICD-10-CM

## 2019-07-28 MED ORDER — IOPAMIDOL (ISOVUE-370) INJECTION 76%
75.0000 mL | Freq: Once | INTRAVENOUS | Status: AC | PRN
  Administered 2019-07-28: 75 mL via INTRAVENOUS

## 2019-08-04 ENCOUNTER — Ambulatory Visit: Payer: Medicare Other | Admitting: Surgery

## 2019-08-04 ENCOUNTER — Encounter: Payer: Self-pay | Admitting: Surgery

## 2019-08-04 ENCOUNTER — Other Ambulatory Visit: Payer: Self-pay

## 2019-08-04 VITALS — BP 137/81 | HR 76 | Temp 97.7°F | Resp 18 | Ht 66.0 in | Wt 280.0 lb

## 2019-08-04 DIAGNOSIS — I712 Thoracic aortic aneurysm, without rupture: Secondary | ICD-10-CM | POA: Diagnosis not present

## 2019-08-04 DIAGNOSIS — I7121 Aneurysm of the ascending aorta, without rupture: Secondary | ICD-10-CM

## 2019-08-04 NOTE — Progress Notes (Signed)
HPI:  The patient is a 78 year old woman who returns today for follow-up of a 4.5 cm fusiform ascending aortic aneurysm.  Her main complaint at this time is of chronic edema in both lower legs which seems to have gotten worse and causing some broth thickening of the skin in the lower legs.  Current Outpatient Medications  Medication Sig Dispense Refill  . allopurinol (ZYLOPRIM) 300 MG tablet Take 150 mg by mouth every evening.     . dofetilide (TIKOSYN) 250 MCG capsule TAKE 1 CAPSULE (250 MCG TOTAL) BY MOUTH 2 (TWO) TIMES DAILY. 180 capsule 3  . furosemide (LASIX) 40 MG tablet Take 40 mg by mouth daily.     Marland Kitchen losartan (COZAAR) 25 MG tablet Take 25 mg by mouth daily.    . magnesium oxide (MAG-OX) 400 (241.3 Mg) MG tablet Take 1 tablet (400 mg total) by mouth 2 (two) times daily. 60 tablet 6  . metFORMIN (GLUCOPHAGE) 500 MG tablet Take 500 mg by mouth 2 (two) times daily with a meal.     . metoprolol tartrate (LOPRESSOR) 25 MG tablet Take 25-50 mg by mouth See admin instructions. Take 2 tablets in the morning, and 2 tablets in the evening.    . Multiple Vitamins-Minerals (PRESERVISION AREDS 2) CAPS Take 2 capsules by mouth daily.    . Naphazoline HCl (CLEAR EYES OP) Place 2 drops into both eyes 2 (two) times daily as needed (for dry eyes).    Marland Kitchen omeprazole (PRILOSEC) 20 MG capsule Take 20 mg by mouth as needed.    . potassium chloride SA (K-DUR,KLOR-CON) 20 MEQ tablet Take 1.5 tablets (30 mEq total) by mouth daily. 30 tablet 6  . VITAMIN D PO Take 3,000 Units by mouth daily.    Marland Kitchen warfarin (COUMADIN) 5 MG tablet Take 5 mg by mouth daily.      No current facility-administered medications for this visit.      Physical Exam: BP 137/81 (BP Location: Left Arm, Patient Position: Sitting, Cuff Size: Large)   Pulse 76 Comment: irreg  Temp 97.7 F (36.5 C)   Resp 18   Ht 5' 6"  (1.676 m)   Wt 280 lb (127 kg)   SpO2 94% Comment: RA  BMI 45.19 kg/m  She is a morbidly obese woman in no  distress. Cardiac exam shows a regular rate and rhythm with normal heart sounds.  There is no murmur. Lungs are clear. There is marked bilateral lower extremity edema with chronic dermatitis and thickening of the skin over the anterior portion of both lower legs.  Diagnostic Tests:  CLINICAL DATA:  Thoracic aortic prominence. History of atrial fibrillation  EXAM: CT ANGIOGRAPHY CHEST WITH CONTRAST  TECHNIQUE: Multidetector CT imaging of the chest was performed using the standard protocol during bolus administration of intravenous contrast. Multiplanar CT image reconstructions and MIPs were obtained to evaluate the vascular anatomy.  CONTRAST:  9m ISOVUE-370 IOPAMIDOL (ISOVUE-370) INJECTION 76%  COMPARISON:  July 16, 2018  FINDINGS: Cardiovascular: Ascending thoracic aortic diameter measures 4.5 x 4.4 cm, stable. The measured diameter at the sinuses of Valsalva measures 3.5 cm. Measured value in the aortic arch region measures 3.1 cm. Measured value in the descending thoracic aorta at the main pulmonary outflow tract level measures 2.5 x 2.5 cm. There is no evident dissection. The visualized great vessels appear unremarkable. There are foci of coronary artery calcification. There is no pericardial effusion or pericardial thickening.  There is no demonstrable pulmonary embolus.  Mediastinum/Nodes: Thyroid appears  unremarkable. There is no appreciable thoracic adenopathy. No esophageal lesions are evident.  Lungs/Pleura: There is a stable calcified granuloma in the posterior segment of the left upper lobe. There are areas of bibasilar atelectasis. There is no edema or consolidation. No pleural effusions are evident. There is a stable 4 mm nodular opacity abutting the pleura in the posterior segment right upper lobe toward the apex.  Upper Abdomen: Visualized upper abdominal structures appear unremarkable.  Musculoskeletal: There are foci of degenerative  change in the thoracic spine. There are no blastic or lytic bone lesions. There are no evident chest wall lesions. There is evidence of old trauma near the sternomanubrial junction with remodeling, stable.  Review of the MIP images confirms the above findings.  IMPRESSION: 1. Stable prominence of the ascending thoracic aorta with measured transverse diameter of 4.5 x 4.4 cm. No evident dissection. Ascending thoracic aortic aneurysm. Recommend semi-annual imaging followup by CTA or MRA and referral to cardiothoracic surgery if not already obtained. This recommendation follows 2010 ACCF/AHA/AATS/ACR/ASA/SCA/SCAI/SIR/STS/SVM Guidelines for the Diagnosis and Management of Patients With Thoracic Aortic Disease. Circulation. 2010; 121: U235-T614. Aortic aneurysm NOS (ICD10-I71.9). Foci of coronary artery calcification noted.  2.  No demonstrable pulmonary embolus.  3. Areas of atelectatic change. No edema or consolidation. Small granuloma left upper lobe. Stable 4 mm nodular opacity right upper lobe.  4.  No adenopathy evident.   Electronically Signed   By: Lowella Grip III M.D.   On: 07/28/2019 14:11   Impression:  She has a stable 4.5 cm fusiform ascending aortic aneurysm.  This is well below the surgical threshold of 5.5 cm.  I reviewed the CT images with answer questions.  I stressed the importance of continued good blood pressure control and preventing further enlargement and acute aortic dissection.  Plan:  I will plan to see her back in 1 year with a CTA of the chest.  She is going to follow-up with Dr. Inda Merlin concerning her chronic lower extremity edema.  It is not clear whether this is related to chronic venous insufficiency exacerbated by morbid obesity or congestive heart failure.   I spent 15 minutes performing this established patient evaluation and > 50% of this time was spent face to face counseling and coordinating the care of this patient's aortic  aneurysm.   Gaye Pollack, MD [facility] [phone]

## 2019-09-15 ENCOUNTER — Ambulatory Visit (HOSPITAL_COMMUNITY): Payer: Medicare Other | Admitting: Nurse Practitioner
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
## 2019-09-17 ENCOUNTER — Encounter (HOSPITAL_COMMUNITY): Payer: Self-pay | Admitting: Physician Assistant

## 2019-09-17 ENCOUNTER — Ambulatory Visit (HOSPITAL_COMMUNITY)
Admission: RE | Admit: 2019-09-17 | Discharge: 2019-09-17 | Disposition: A | Discharge status: Home / Self Care | Payer: Medicare Other | Source: Ambulatory Visit | Attending: Nurse Practitioner | Admitting: Nurse Practitioner

## 2019-09-17 ENCOUNTER — Other Ambulatory Visit: Payer: Self-pay

## 2019-09-17 VITALS — BP 124/78 | HR 58 | Ht 66.0 in | Wt 284.8 lb

## 2019-09-17 DIAGNOSIS — Z7901 Long term (current) use of anticoagulants: Secondary | ICD-10-CM | POA: Diagnosis not present

## 2019-09-17 DIAGNOSIS — Z887 Allergy status to serum and vaccine status: Secondary | ICD-10-CM | POA: Diagnosis not present

## 2019-09-17 DIAGNOSIS — E785 Hyperlipidemia, unspecified: Secondary | ICD-10-CM | POA: Diagnosis not present

## 2019-09-17 DIAGNOSIS — Z79899 Other long term (current) drug therapy: Secondary | ICD-10-CM | POA: Diagnosis not present

## 2019-09-17 DIAGNOSIS — M170 Bilateral primary osteoarthritis of knee: Secondary | ICD-10-CM | POA: Insufficient documentation

## 2019-09-17 DIAGNOSIS — Z86711 Personal history of pulmonary embolism: Secondary | ICD-10-CM | POA: Diagnosis not present

## 2019-09-17 DIAGNOSIS — G4733 Obstructive sleep apnea (adult) (pediatric): Secondary | ICD-10-CM | POA: Insufficient documentation

## 2019-09-17 DIAGNOSIS — I491 Atrial premature depolarization: Secondary | ICD-10-CM | POA: Diagnosis not present

## 2019-09-17 DIAGNOSIS — I10 Essential (primary) hypertension: Secondary | ICD-10-CM | POA: Diagnosis not present

## 2019-09-17 DIAGNOSIS — Z7984 Long term (current) use of oral hypoglycemic drugs: Secondary | ICD-10-CM | POA: Insufficient documentation

## 2019-09-17 DIAGNOSIS — Z6841 Body Mass Index (BMI) 40.0 and over, adult: Secondary | ICD-10-CM | POA: Insufficient documentation

## 2019-09-17 DIAGNOSIS — M1909 Primary osteoarthritis, other specified site: Secondary | ICD-10-CM | POA: Diagnosis not present

## 2019-09-17 DIAGNOSIS — E669 Obesity, unspecified: Secondary | ICD-10-CM | POA: Diagnosis not present

## 2019-09-17 DIAGNOSIS — Z96653 Presence of artificial knee joint, bilateral: Secondary | ICD-10-CM | POA: Insufficient documentation

## 2019-09-17 DIAGNOSIS — E1142 Type 2 diabetes mellitus with diabetic polyneuropathy: Secondary | ICD-10-CM | POA: Diagnosis not present

## 2019-09-17 DIAGNOSIS — K219 Gastro-esophageal reflux disease without esophagitis: Secondary | ICD-10-CM | POA: Diagnosis not present

## 2019-09-17 DIAGNOSIS — I712 Thoracic aortic aneurysm, without rupture: Secondary | ICD-10-CM | POA: Diagnosis not present

## 2019-09-17 DIAGNOSIS — Z8249 Family history of ischemic heart disease and other diseases of the circulatory system: Secondary | ICD-10-CM | POA: Diagnosis not present

## 2019-09-17 DIAGNOSIS — M19042 Primary osteoarthritis, left hand: Secondary | ICD-10-CM | POA: Diagnosis not present

## 2019-09-17 DIAGNOSIS — Z888 Allergy status to other drugs, medicaments and biological substances status: Secondary | ICD-10-CM | POA: Insufficient documentation

## 2019-09-17 DIAGNOSIS — D6869 Other thrombophilia: Secondary | ICD-10-CM

## 2019-09-17 DIAGNOSIS — M19041 Primary osteoarthritis, right hand: Secondary | ICD-10-CM | POA: Insufficient documentation

## 2019-09-17 DIAGNOSIS — M109 Gout, unspecified: Secondary | ICD-10-CM | POA: Diagnosis not present

## 2019-09-17 DIAGNOSIS — Z885 Allergy status to narcotic agent status: Secondary | ICD-10-CM | POA: Diagnosis not present

## 2019-09-17 DIAGNOSIS — I4819 Other persistent atrial fibrillation: Secondary | ICD-10-CM | POA: Insufficient documentation

## 2019-09-17 DIAGNOSIS — M479 Spondylosis, unspecified: Secondary | ICD-10-CM | POA: Diagnosis not present

## 2019-09-17 LAB — BASIC METABOLIC PANEL
Anion gap: 11 (ref 5–15)
BUN: 29 mg/dL — ABNORMAL HIGH (ref 8–23)
CO2: 25 mmol/L (ref 22–32)
Calcium: 9.4 mg/dL (ref 8.9–10.3)
Chloride: 103 mmol/L (ref 98–111)
Creatinine, Ser: 0.75 mg/dL (ref 0.44–1.00)
GFR calc Af Amer: >60 mL/min (ref >60)
GFR calc non Af Amer: >60 mL/min (ref >60)
Glucose, Bld: 111 mg/dL — ABNORMAL HIGH (ref 70–99)
Potassium: 3.9 mmol/L (ref 3.5–5.1)
Sodium: 139 mmol/L (ref 135–145)

## 2019-09-17 LAB — MAGNESIUM: Magnesium: 2 mg/dL (ref 1.7–2.4)

## 2019-10-22 ENCOUNTER — Other Ambulatory Visit: Payer: Self-pay | Admitting: Internal Medicine

## 2019-10-22 DIAGNOSIS — M5410 Radiculopathy, site unspecified: Secondary | ICD-10-CM

## 2019-10-22 DIAGNOSIS — R200 Anesthesia of skin: Secondary | ICD-10-CM

## 2019-10-22 DIAGNOSIS — M79605 Pain in left leg: Secondary | ICD-10-CM

## 2019-10-22 DIAGNOSIS — R208 Other disturbances of skin sensation: Secondary | ICD-10-CM

## 2019-11-18 ENCOUNTER — Ambulatory Visit
Admission: RE | Admit: 2019-11-18 | Discharge: 2019-11-18 | Disposition: A | Discharge status: Home / Self Care | Payer: Medicare Other | Source: Ambulatory Visit | Attending: Internal Medicine | Admitting: Internal Medicine

## 2019-11-18 DIAGNOSIS — R200 Anesthesia of skin: Secondary | ICD-10-CM

## 2019-11-18 DIAGNOSIS — M79605 Pain in left leg: Secondary | ICD-10-CM

## 2019-11-18 DIAGNOSIS — M5410 Radiculopathy, site unspecified: Secondary | ICD-10-CM

## 2019-11-18 DIAGNOSIS — R208 Other disturbances of skin sensation: Secondary | ICD-10-CM

## 2019-11-29 ENCOUNTER — Encounter (INDEPENDENT_AMBULATORY_CARE_PROVIDER_SITE_OTHER): Payer: Medicare Other | Admitting: Ophthalmology

## 2019-12-08 ENCOUNTER — Encounter (INDEPENDENT_AMBULATORY_CARE_PROVIDER_SITE_OTHER): Payer: Medicare Other | Admitting: Ophthalmology

## 2019-12-08 DIAGNOSIS — H43813 Vitreous degeneration, bilateral: Secondary | ICD-10-CM

## 2019-12-08 DIAGNOSIS — H35033 Hypertensive retinopathy, bilateral: Secondary | ICD-10-CM

## 2019-12-08 DIAGNOSIS — H353132 Nonexudative age-related macular degeneration, bilateral, intermediate dry stage: Secondary | ICD-10-CM

## 2019-12-08 DIAGNOSIS — H35372 Puckering of macula, left eye: Secondary | ICD-10-CM

## 2019-12-08 DIAGNOSIS — I10 Essential (primary) hypertension: Secondary | ICD-10-CM | POA: Diagnosis not present

## 2019-12-22 DIAGNOSIS — M5136 Other intervertebral disc degeneration, lumbar region: Secondary | ICD-10-CM | POA: Insufficient documentation

## 2019-12-29 ENCOUNTER — Other Ambulatory Visit: Payer: Self-pay | Admitting: Physical Medicine and Rehabilitation

## 2019-12-29 DIAGNOSIS — M48062 Spinal stenosis, lumbar region with neurogenic claudication: Secondary | ICD-10-CM

## 2020-01-05 ENCOUNTER — Other Ambulatory Visit: Payer: Self-pay

## 2020-01-05 ENCOUNTER — Other Ambulatory Visit: Payer: Medicare Other

## 2020-01-05 ENCOUNTER — Ambulatory Visit
Admission: RE | Admit: 2020-01-05 | Discharge: 2020-01-05 | Disposition: A | Discharge status: Home / Self Care | Payer: Medicare Other | Source: Ambulatory Visit | Attending: Physical Medicine and Rehabilitation | Admitting: Physical Medicine and Rehabilitation

## 2020-01-05 ENCOUNTER — Other Ambulatory Visit: Payer: Self-pay | Admitting: Physical Medicine and Rehabilitation

## 2020-01-05 DIAGNOSIS — M48062 Spinal stenosis, lumbar region with neurogenic claudication: Secondary | ICD-10-CM

## 2020-01-05 MED ORDER — IOPAMIDOL (ISOVUE-M 200) INJECTION 41%
1.0000 mL | Freq: Once | INTRAMUSCULAR | Status: AC
  Administered 2020-01-05: 1 mL via EPIDURAL

## 2020-01-05 MED ORDER — METHYLPREDNISOLONE ACETATE 40 MG/ML INJ SUSP (RADIOLOG
120.0000 mg | Freq: Once | INTRAMUSCULAR | Status: AC
  Administered 2020-01-05: 120 mg via EPIDURAL

## 2020-01-05 NOTE — Discharge Instructions (Signed)
Spinal Injection Discharge Instruction Sheet  1. You may resume a regular diet and any medications that you routinely take, including pain medications.  2. No driving the rest of the day of the procedure.  3. Light activity throughout the rest of the day.  Do not do any strenuous work, exercise, bending or lifting.  The day following the procedure, you may resume normal physical activity but you should refrain from exercising or physical therapy for at least three days.   Common Side Effects:   Headaches- take your usual medications as directed by your physician.     Restlessness or inability to sleep- you may have trouble sleeping for the next few days.  Ask your referring physician if you need any medication for sleep if over the counter sleep medications do not help.   Facial flushing or redness- this should subside within a few days.   Increased pain- a temporary increase in pain a day or two following your procedure is not unusual.  Take your pain medication as prescribed by your referring physician.  You may use ice to the injection site as needed.  Please do not use heat for 24 hours.   Leg cramps  Please contact our office at [phone] for the following symptoms:  Fever greater than 100 degrees.  Headaches unresolved with medication after 2-3 days.  Increased swelling, pain, or redness at injection site.  Thank you for visiting our office.   You may resume Coumadin/Warfarin tomorrow.

## 2020-03-20 ENCOUNTER — Ambulatory Visit (HOSPITAL_COMMUNITY): Payer: Medicare Other | Admitting: Physician Assistant

## 2020-03-27 ENCOUNTER — Other Ambulatory Visit: Payer: Self-pay

## 2020-03-27 ENCOUNTER — Ambulatory Visit (HOSPITAL_COMMUNITY)
Admission: RE | Admit: 2020-03-27 | Discharge: 2020-03-27 | Disposition: A | Discharge status: Home / Self Care | Payer: Medicare Other | Source: Ambulatory Visit | Attending: Nurse Practitioner | Admitting: Nurse Practitioner

## 2020-03-27 ENCOUNTER — Encounter (HOSPITAL_COMMUNITY): Payer: Self-pay | Admitting: Physician Assistant

## 2020-03-27 VITALS — BP 118/74 | HR 75 | Ht 66.0 in | Wt 291.4 lb

## 2020-03-27 DIAGNOSIS — K219 Gastro-esophageal reflux disease without esophagitis: Secondary | ICD-10-CM | POA: Insufficient documentation

## 2020-03-27 DIAGNOSIS — I4819 Other persistent atrial fibrillation: Secondary | ICD-10-CM | POA: Diagnosis not present

## 2020-03-27 DIAGNOSIS — E119 Type 2 diabetes mellitus without complications: Secondary | ICD-10-CM | POA: Diagnosis not present

## 2020-03-27 DIAGNOSIS — D6869 Other thrombophilia: Secondary | ICD-10-CM

## 2020-03-27 DIAGNOSIS — Z86711 Personal history of pulmonary embolism: Secondary | ICD-10-CM | POA: Diagnosis not present

## 2020-03-27 DIAGNOSIS — G4733 Obstructive sleep apnea (adult) (pediatric): Secondary | ICD-10-CM | POA: Diagnosis not present

## 2020-03-27 DIAGNOSIS — Z6841 Body Mass Index (BMI) 40.0 and over, adult: Secondary | ICD-10-CM | POA: Diagnosis not present

## 2020-03-27 DIAGNOSIS — Z79891 Long term (current) use of opiate analgesic: Secondary | ICD-10-CM | POA: Insufficient documentation

## 2020-03-27 DIAGNOSIS — Z885 Allergy status to narcotic agent status: Secondary | ICD-10-CM | POA: Diagnosis not present

## 2020-03-27 DIAGNOSIS — Z96653 Presence of artificial knee joint, bilateral: Secondary | ICD-10-CM | POA: Insufficient documentation

## 2020-03-27 DIAGNOSIS — M109 Gout, unspecified: Secondary | ICD-10-CM | POA: Insufficient documentation

## 2020-03-27 DIAGNOSIS — Z79899 Other long term (current) drug therapy: Secondary | ICD-10-CM | POA: Diagnosis not present

## 2020-03-27 DIAGNOSIS — I480 Paroxysmal atrial fibrillation: Secondary | ICD-10-CM

## 2020-03-27 DIAGNOSIS — E669 Obesity, unspecified: Secondary | ICD-10-CM | POA: Diagnosis not present

## 2020-03-27 DIAGNOSIS — I712 Thoracic aortic aneurysm, without rupture: Secondary | ICD-10-CM | POA: Insufficient documentation

## 2020-03-27 DIAGNOSIS — M159 Polyosteoarthritis, unspecified: Secondary | ICD-10-CM | POA: Diagnosis not present

## 2020-03-27 DIAGNOSIS — Z7984 Long term (current) use of oral hypoglycemic drugs: Secondary | ICD-10-CM | POA: Insufficient documentation

## 2020-03-27 DIAGNOSIS — Z7901 Long term (current) use of anticoagulants: Secondary | ICD-10-CM | POA: Insufficient documentation

## 2020-03-27 DIAGNOSIS — Z888 Allergy status to other drugs, medicaments and biological substances status: Secondary | ICD-10-CM | POA: Insufficient documentation

## 2020-03-27 DIAGNOSIS — I10 Essential (primary) hypertension: Secondary | ICD-10-CM | POA: Diagnosis not present

## 2020-03-27 DIAGNOSIS — Z9989 Dependence on other enabling machines and devices: Secondary | ICD-10-CM | POA: Diagnosis not present

## 2020-03-27 DIAGNOSIS — Z86718 Personal history of other venous thrombosis and embolism: Secondary | ICD-10-CM | POA: Diagnosis not present

## 2020-03-27 DIAGNOSIS — Z86010 Personal history of colonic polyps: Secondary | ICD-10-CM | POA: Diagnosis not present

## 2020-03-27 DIAGNOSIS — D649 Anemia, unspecified: Secondary | ICD-10-CM | POA: Insufficient documentation

## 2020-03-27 LAB — MAGNESIUM: Magnesium: 1.9 mg/dL (ref 1.7–2.4)

## 2020-03-27 LAB — BASIC METABOLIC PANEL
Anion gap: 10 (ref 5–15)
BUN: 16 mg/dL (ref 8–23)
CO2: 26 mmol/L (ref 22–32)
Calcium: 9.5 mg/dL (ref 8.9–10.3)
Chloride: 103 mmol/L (ref 98–111)
Creatinine, Ser: 0.54 mg/dL (ref 0.44–1.00)
GFR calc Af Amer: >60 mL/min (ref >60)
GFR calc non Af Amer: >60 mL/min (ref >60)
Glucose, Bld: 124 mg/dL — ABNORMAL HIGH (ref 70–99)
Potassium: 4.1 mmol/L (ref 3.5–5.1)
Sodium: 139 mmol/L (ref 135–145)
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
## 2020-04-10 ENCOUNTER — Other Ambulatory Visit (HOSPITAL_COMMUNITY): Payer: Self-pay | Admitting: Nurse Practitioner

## 2020-07-04 ENCOUNTER — Other Ambulatory Visit: Payer: Self-pay | Admitting: Surgery

## 2020-07-04 DIAGNOSIS — I712 Thoracic aortic aneurysm, without rupture, unspecified: Secondary | ICD-10-CM

## 2020-08-09 ENCOUNTER — Ambulatory Visit
Admission: RE | Admit: 2020-08-09 | Discharge: 2020-08-09 | Disposition: A | Discharge status: Home / Self Care | Payer: Medicare Other | Source: Ambulatory Visit | Attending: Surgery | Admitting: Surgery

## 2020-08-09 ENCOUNTER — Other Ambulatory Visit: Payer: Self-pay

## 2020-08-09 ENCOUNTER — Ambulatory Visit: Payer: Medicare Other | Admitting: Surgery

## 2020-08-09 ENCOUNTER — Encounter: Payer: Self-pay | Admitting: Surgery

## 2020-08-09 VITALS — BP 170/78 | HR 61 | Resp 20 | Ht 66.0 in | Wt 285.0 lb

## 2020-08-09 DIAGNOSIS — I712 Thoracic aortic aneurysm, without rupture, unspecified: Secondary | ICD-10-CM

## 2020-08-09 MED ORDER — IOPAMIDOL (ISOVUE-370) INJECTION 76%
75.0000 mL | Freq: Once | INTRAVENOUS | Status: AC | PRN
  Administered 2020-08-09: 75 mL via INTRAVENOUS

## 2020-08-10 NOTE — Progress Notes (Signed)
HPI  The patient is a 79 year old woman who returns today for follow-up of a 4.5 cm fusiform ascending aortic aneurysm.  She denies any chest or back pain.  She does report some exertional fatigue.  She has chronic edema of both lower legs.   Current Outpatient Medications  Medication Sig Dispense Refill   allopurinol (ZYLOPRIM) 300 MG tablet Take 150 mg by mouth every evening.      dofetilide (TIKOSYN) 250 MCG capsule TAKE 1 CAPSULE BY MOUTH TWICE DAILY 180 capsule 3   furosemide (LASIX) 40 MG tablet Take 40 mg by mouth daily.      HYDROcodone-acetaminophen (NORCO/VICODIN) 5-325 MG tablet Take 1 tablet by mouth 2 (two) times daily as needed.     losartan (COZAAR) 25 MG tablet Take 25 mg by mouth daily.     magnesium oxide (MAG-OX) 400 (241.3 Mg) MG tablet Take 1 tablet (400 mg total) by mouth 2 (two) times daily. 60 tablet 6   metFORMIN (GLUCOPHAGE) 500 MG tablet Take 500 mg by mouth 2 (two) times daily with a meal.      metoprolol tartrate (LOPRESSOR) 25 MG tablet Take 25-50 mg by mouth See admin instructions. Take 2 tablets in the morning, and 2 tablets in the evening.     Multiple Vitamins-Minerals (PRESERVISION AREDS 2) CAPS Take 2 capsules by mouth daily.     Naphazoline HCl (CLEAR EYES OP) Place 2 drops into both eyes 2 (two) times daily as needed (for dry eyes).     ONETOUCH ULTRA test strip      potassium chloride SA (K-DUR,KLOR-CON) 20 MEQ tablet Take 1.5 tablets (30 mEq total) by mouth daily. 30 tablet 6   vitamin B-12 (CYANOCOBALAMIN) 500 MCG tablet Take 500 mcg by mouth daily.     VITAMIN D PO Take 3,000 Units by mouth daily.     warfarin (COUMADIN) 5 MG tablet Take 5 mg by mouth daily.      No current facility-administered medications for this visit.     Physical Exam BP (!) 170/78 (BP Location: Left Arm, Patient Position: Sitting)    Pulse 61    Resp 20    Ht 5' 6"  (1.676 m)    Wt 285 lb (129.3 kg)    SpO2 90% Comment: RA with mask on   BMI 46.00  kg/m  She is a morbidly obese woman with a BMI of 46 who is in no distress. Cardiac exam shows a regular rate and rhythm with normal heart sounds. Lungs are clear. There is marked bilateral lower extremity edema which is chronic.  Diagnostic Tests:  CLINICAL DATA:  THORACIC AORTIC ANEURYSM FOLLOW-UP.  EXAM: CT ANGIOGRAPHY CHEST WITH CONTRAST  TECHNIQUE: Multidetector CT imaging of the chest was performed using the standard protocol during bolus administration of intravenous contrast. Multiplanar CT image reconstructions and MIPs were obtained to evaluate the vascular anatomy.  CONTRAST:  82m ISOVUE-370 IOPAMIDOL (ISOVUE-370) INJECTION 76%  COMPARISON:  CTA 08/07/2019  FINDINGS: Cardiovascular: Stable size of the ascending aortic aneurysm, which measures 4.4 x 4.4 cm on series 4, image 57. The measured diameter at the sinuses of Valsalva measures 3.5 cm, unchanged. Measured diameter in the descending thoracic aorta at the main pulmonary outflow tract measures 2.5 by 2.4 cm, unchanged. No evidence of dissection. No pericardial effusion. Great vessels are patent. No evidence of central pulmonary embolus.  Mediastinum/Nodes: No enlarged mediastinal, hilar, or axillary lymph nodes. Thyroid gland, trachea, and esophagus demonstrate no significant findings.  Lungs/Pleura: Similar  calcified granuloma in the left upper lobe. Bibasilar atelectasis/scar. Similar 4 mm nodular opacity abutting the pleura in the posterior right upper lobe (series 6, image 27). No consolidation.  Upper Abdomen: No acute abnormality. Similar 1.3 cm right adrenal nodule, probably an adenoma.  Musculoskeletal: Multilevel degenerative change of the thoracic spine with flowing anterior osteophytes, suggestive of diffuse idiopathic skeletal hyperostosis.  Review of the MIP images confirms the above findings.  IMPRESSION: 1. Stable aneurysm of the ascending aorta, measuring up to 4.4  cm. Recommend semi-annual imaging followup by CTA or MRA and referral to cardiothoracic surgery if not already obtained. This recommendation follows 2010 ACCF/AHA/AATS/ACR/ASA/SCA/SCAI/SIR/STS/SVM Guidelines for the Diagnosis and Management of Patients With Thoracic Aortic Disease. Circulation. 2010; 121: Y051-T02. Aortic aneurysm NOS (ICD10-I71.9) 2. No acute intrathoracic findings. 3. 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   Electronically Signed   By: Margaretha Sheffield MD   On: 08/09/2020 10:45   Impression:  This 79 year old patient has a stable 4.4 cm fusiform ascending aortic aneurysm.  This is still well below the surgical threshold of 5.5 cm.  She had a 2D echocardiogram in 2018 showing a trileaflet aortic valve with mildly thickened leaflets and no stenosis or regurgitation.  I reviewed the CTA images with her and answered her questions.  I stressed the importance of continued good blood pressure control and preventing further enlargement and acute aortic dissection.     Plan:  I will plan to see her back in 1 year with a CTA of the chest.  I spent 20 minutes performing this established patient evaluation and > 50% of this time was spent face to face counseling and coordinating the care of this patient's aortic aneurysm.   Gaye Pollack, MD [facility] [phone]

## 2020-08-11 ENCOUNTER — Telehealth (HOSPITAL_COMMUNITY): Payer: Self-pay | Admitting: *Deleted

## 2020-08-11 MED ORDER — MAGNESIUM OXIDE 400 (241.3 MG) MG PO TABS: 400.0000 mg | ORAL_TABLET | Freq: Every day | ORAL | 6 refills | Status: DC

## 2020-08-11 NOTE — Telephone Encounter (Signed)
Patient having issues with diarrhea and GI doctor believes magnesium BID is causing. She has reduced her magnesium to once a day and symptoms have improved. She will have her PCP recheck her magnesium in 2 weeks and have labs faxed here.

## 2020-09-27 ENCOUNTER — Ambulatory Visit (HOSPITAL_COMMUNITY): Payer: Medicare Other | Admitting: Nurse Practitioner

## 2020-09-27 ENCOUNTER — Other Ambulatory Visit: Payer: Self-pay

## 2020-09-27 ENCOUNTER — Encounter (HOSPITAL_COMMUNITY): Payer: Self-pay | Admitting: Nurse Practitioner

## 2020-09-27 ENCOUNTER — Ambulatory Visit (HOSPITAL_COMMUNITY)
Admission: RE | Admit: 2020-09-27 | Discharge: 2020-09-27 | Disposition: A | Discharge status: Home / Self Care | Payer: Medicare Other | Source: Ambulatory Visit | Attending: Nurse Practitioner | Admitting: Nurse Practitioner

## 2020-09-27 VITALS — BP 134/66 | HR 65 | Ht 66.0 in | Wt 284.6 lb

## 2020-09-27 DIAGNOSIS — G4733 Obstructive sleep apnea (adult) (pediatric): Secondary | ICD-10-CM | POA: Insufficient documentation

## 2020-09-27 DIAGNOSIS — I4819 Other persistent atrial fibrillation: Secondary | ICD-10-CM | POA: Diagnosis present

## 2020-09-27 DIAGNOSIS — I480 Paroxysmal atrial fibrillation: Secondary | ICD-10-CM | POA: Diagnosis not present

## 2020-09-27 DIAGNOSIS — Z79899 Other long term (current) drug therapy: Secondary | ICD-10-CM | POA: Diagnosis not present

## 2020-09-27 DIAGNOSIS — I10 Essential (primary) hypertension: Secondary | ICD-10-CM | POA: Insufficient documentation

## 2020-09-27 DIAGNOSIS — D6869 Other thrombophilia: Secondary | ICD-10-CM

## 2020-09-27 DIAGNOSIS — Z6841 Body Mass Index (BMI) 40.0 and over, adult: Secondary | ICD-10-CM | POA: Insufficient documentation

## 2020-09-27 DIAGNOSIS — E669 Obesity, unspecified: Secondary | ICD-10-CM | POA: Insufficient documentation

## 2020-09-27 DIAGNOSIS — Z7901 Long term (current) use of anticoagulants: Secondary | ICD-10-CM | POA: Diagnosis not present
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
## 2020-10-25 DIAGNOSIS — I2699 Other pulmonary embolism without acute cor pulmonale: Secondary | ICD-10-CM | POA: Diagnosis not present

## 2020-11-09 DIAGNOSIS — E0840 Diabetes mellitus due to underlying condition with diabetic neuropathy, unspecified: Secondary | ICD-10-CM | POA: Diagnosis not present

## 2020-11-09 DIAGNOSIS — E11319 Type 2 diabetes mellitus with unspecified diabetic retinopathy without macular edema: Secondary | ICD-10-CM | POA: Diagnosis not present

## 2020-11-09 DIAGNOSIS — G43009 Migraine without aura, not intractable, without status migrainosus: Secondary | ICD-10-CM | POA: Diagnosis not present

## 2020-11-09 DIAGNOSIS — E1165 Type 2 diabetes mellitus with hyperglycemia: Secondary | ICD-10-CM | POA: Diagnosis not present

## 2020-11-09 DIAGNOSIS — M810 Age-related osteoporosis without current pathological fracture: Secondary | ICD-10-CM | POA: Diagnosis not present

## 2020-11-09 DIAGNOSIS — E782 Mixed hyperlipidemia: Secondary | ICD-10-CM | POA: Diagnosis not present

## 2020-11-09 DIAGNOSIS — I10 Essential (primary) hypertension: Secondary | ICD-10-CM | POA: Diagnosis not present

## 2020-11-09 DIAGNOSIS — I4891 Unspecified atrial fibrillation: Secondary | ICD-10-CM | POA: Diagnosis not present

## 2020-11-09 DIAGNOSIS — M179 Osteoarthritis of knee, unspecified: Secondary | ICD-10-CM | POA: Diagnosis not present

## 2020-11-09 DIAGNOSIS — M189 Osteoarthritis of first carpometacarpal joint, unspecified: Secondary | ICD-10-CM | POA: Diagnosis not present

## 2020-11-14 DIAGNOSIS — L84 Corns and callosities: Secondary | ICD-10-CM | POA: Diagnosis not present

## 2020-11-14 DIAGNOSIS — B351 Tinea unguium: Secondary | ICD-10-CM | POA: Diagnosis not present

## 2020-11-14 DIAGNOSIS — E1142 Type 2 diabetes mellitus with diabetic polyneuropathy: Secondary | ICD-10-CM | POA: Diagnosis not present

## 2020-11-14 DIAGNOSIS — M79676 Pain in unspecified toe(s): Secondary | ICD-10-CM | POA: Diagnosis not present

## 2020-12-05 DIAGNOSIS — G4733 Obstructive sleep apnea (adult) (pediatric): Secondary | ICD-10-CM | POA: Diagnosis not present

## 2020-12-06 DIAGNOSIS — E1165 Type 2 diabetes mellitus with hyperglycemia: Secondary | ICD-10-CM | POA: Diagnosis not present

## 2020-12-06 DIAGNOSIS — I10 Essential (primary) hypertension: Secondary | ICD-10-CM | POA: Diagnosis not present

## 2020-12-06 DIAGNOSIS — E11319 Type 2 diabetes mellitus with unspecified diabetic retinopathy without macular edema: Secondary | ICD-10-CM | POA: Diagnosis not present

## 2020-12-06 DIAGNOSIS — I4891 Unspecified atrial fibrillation: Secondary | ICD-10-CM | POA: Diagnosis not present

## 2020-12-06 DIAGNOSIS — M189 Osteoarthritis of first carpometacarpal joint, unspecified: Secondary | ICD-10-CM | POA: Diagnosis not present

## 2020-12-06 DIAGNOSIS — E0840 Diabetes mellitus due to underlying condition with diabetic neuropathy, unspecified: Secondary | ICD-10-CM | POA: Diagnosis not present

## 2020-12-06 DIAGNOSIS — E782 Mixed hyperlipidemia: Secondary | ICD-10-CM | POA: Diagnosis not present

## 2020-12-06 DIAGNOSIS — M179 Osteoarthritis of knee, unspecified: Secondary | ICD-10-CM | POA: Diagnosis not present

## 2020-12-06 DIAGNOSIS — G43009 Migraine without aura, not intractable, without status migrainosus: Secondary | ICD-10-CM | POA: Diagnosis not present

## 2020-12-06 DIAGNOSIS — M810 Age-related osteoporosis without current pathological fracture: Secondary | ICD-10-CM | POA: Diagnosis not present

## 2020-12-07 ENCOUNTER — Encounter (INDEPENDENT_AMBULATORY_CARE_PROVIDER_SITE_OTHER): Payer: Medicare Other | Admitting: Ophthalmology

## 2020-12-21 ENCOUNTER — Encounter (INDEPENDENT_AMBULATORY_CARE_PROVIDER_SITE_OTHER): Payer: Medicare Other | Admitting: Ophthalmology

## 2020-12-29 ENCOUNTER — Encounter (INDEPENDENT_AMBULATORY_CARE_PROVIDER_SITE_OTHER): Payer: Medicare Other | Admitting: Ophthalmology

## 2020-12-29 ENCOUNTER — Other Ambulatory Visit: Payer: Self-pay

## 2020-12-29 DIAGNOSIS — Z7901 Long term (current) use of anticoagulants: Secondary | ICD-10-CM | POA: Diagnosis not present

## 2020-12-29 DIAGNOSIS — I10 Essential (primary) hypertension: Secondary | ICD-10-CM

## 2020-12-29 DIAGNOSIS — H35033 Hypertensive retinopathy, bilateral: Secondary | ICD-10-CM

## 2020-12-29 DIAGNOSIS — H353112 Nonexudative age-related macular degeneration, right eye, intermediate dry stage: Secondary | ICD-10-CM | POA: Diagnosis not present

## 2020-12-29 DIAGNOSIS — H35372 Puckering of macula, left eye: Secondary | ICD-10-CM | POA: Diagnosis not present

## 2020-12-29 DIAGNOSIS — H353221 Exudative age-related macular degeneration, left eye, with active choroidal neovascularization: Secondary | ICD-10-CM

## 2020-12-29 DIAGNOSIS — H43813 Vitreous degeneration, bilateral: Secondary | ICD-10-CM

## 2021-01-02 ENCOUNTER — Encounter (INDEPENDENT_AMBULATORY_CARE_PROVIDER_SITE_OTHER): Payer: Medicare Other | Admitting: Ophthalmology

## 2021-01-03 ENCOUNTER — Encounter (INDEPENDENT_AMBULATORY_CARE_PROVIDER_SITE_OTHER): Payer: Medicare Other | Admitting: Ophthalmology

## 2021-01-03 ENCOUNTER — Other Ambulatory Visit: Payer: Self-pay

## 2021-01-03 DIAGNOSIS — H353221 Exudative age-related macular degeneration, left eye, with active choroidal neovascularization: Secondary | ICD-10-CM | POA: Diagnosis not present

## 2021-01-10 DIAGNOSIS — E11319 Type 2 diabetes mellitus with unspecified diabetic retinopathy without macular edema: Secondary | ICD-10-CM | POA: Diagnosis not present

## 2021-01-10 DIAGNOSIS — E1165 Type 2 diabetes mellitus with hyperglycemia: Secondary | ICD-10-CM | POA: Diagnosis not present

## 2021-01-10 DIAGNOSIS — E782 Mixed hyperlipidemia: Secondary | ICD-10-CM | POA: Diagnosis not present

## 2021-01-10 DIAGNOSIS — G43009 Migraine without aura, not intractable, without status migrainosus: Secondary | ICD-10-CM | POA: Diagnosis not present

## 2021-01-10 DIAGNOSIS — M179 Osteoarthritis of knee, unspecified: Secondary | ICD-10-CM | POA: Diagnosis not present

## 2021-01-10 DIAGNOSIS — M189 Osteoarthritis of first carpometacarpal joint, unspecified: Secondary | ICD-10-CM | POA: Diagnosis not present

## 2021-01-10 DIAGNOSIS — I10 Essential (primary) hypertension: Secondary | ICD-10-CM | POA: Diagnosis not present

## 2021-01-10 DIAGNOSIS — I4891 Unspecified atrial fibrillation: Secondary | ICD-10-CM | POA: Diagnosis not present

## 2021-01-10 DIAGNOSIS — M810 Age-related osteoporosis without current pathological fracture: Secondary | ICD-10-CM | POA: Diagnosis not present

## 2021-01-22 ENCOUNTER — Other Ambulatory Visit (HOSPITAL_COMMUNITY): Payer: Self-pay | Admitting: Nurse Practitioner

## 2021-02-06 DIAGNOSIS — E0840 Diabetes mellitus due to underlying condition with diabetic neuropathy, unspecified: Secondary | ICD-10-CM | POA: Diagnosis not present

## 2021-02-06 DIAGNOSIS — E11319 Type 2 diabetes mellitus with unspecified diabetic retinopathy without macular edema: Secondary | ICD-10-CM | POA: Diagnosis not present

## 2021-02-06 DIAGNOSIS — E782 Mixed hyperlipidemia: Secondary | ICD-10-CM | POA: Diagnosis not present

## 2021-02-06 DIAGNOSIS — I10 Essential (primary) hypertension: Secondary | ICD-10-CM | POA: Diagnosis not present

## 2021-02-06 DIAGNOSIS — G43009 Migraine without aura, not intractable, without status migrainosus: Secondary | ICD-10-CM | POA: Diagnosis not present

## 2021-02-06 DIAGNOSIS — M810 Age-related osteoporosis without current pathological fracture: Secondary | ICD-10-CM | POA: Diagnosis not present

## 2021-02-06 DIAGNOSIS — E1165 Type 2 diabetes mellitus with hyperglycemia: Secondary | ICD-10-CM | POA: Diagnosis not present

## 2021-02-06 DIAGNOSIS — I4891 Unspecified atrial fibrillation: Secondary | ICD-10-CM | POA: Diagnosis not present

## 2021-02-07 DIAGNOSIS — Z7901 Long term (current) use of anticoagulants: Secondary | ICD-10-CM | POA: Diagnosis not present

## 2021-02-07 DIAGNOSIS — R350 Frequency of micturition: Secondary | ICD-10-CM | POA: Diagnosis not present

## 2021-02-08 ENCOUNTER — Other Ambulatory Visit: Payer: Self-pay

## 2021-02-08 ENCOUNTER — Encounter (INDEPENDENT_AMBULATORY_CARE_PROVIDER_SITE_OTHER): Payer: Medicare Other | Admitting: Ophthalmology

## 2021-02-08 DIAGNOSIS — H35033 Hypertensive retinopathy, bilateral: Secondary | ICD-10-CM

## 2021-02-08 DIAGNOSIS — H353112 Nonexudative age-related macular degeneration, right eye, intermediate dry stage: Secondary | ICD-10-CM | POA: Diagnosis not present

## 2021-02-08 DIAGNOSIS — H43813 Vitreous degeneration, bilateral: Secondary | ICD-10-CM

## 2021-02-08 DIAGNOSIS — H353221 Exudative age-related macular degeneration, left eye, with active choroidal neovascularization: Secondary | ICD-10-CM | POA: Diagnosis not present

## 2021-02-08 DIAGNOSIS — I10 Essential (primary) hypertension: Secondary | ICD-10-CM

## 2021-02-08 DIAGNOSIS — H35372 Puckering of macula, left eye: Secondary | ICD-10-CM | POA: Diagnosis not present

## 2021-02-13 DIAGNOSIS — L84 Corns and callosities: Secondary | ICD-10-CM | POA: Diagnosis not present

## 2021-02-13 DIAGNOSIS — E1142 Type 2 diabetes mellitus with diabetic polyneuropathy: Secondary | ICD-10-CM | POA: Diagnosis not present

## 2021-02-13 DIAGNOSIS — B351 Tinea unguium: Secondary | ICD-10-CM | POA: Diagnosis not present

## 2021-02-13 DIAGNOSIS — M79676 Pain in unspecified toe(s): Secondary | ICD-10-CM | POA: Diagnosis not present

## 2021-02-16 ENCOUNTER — Other Ambulatory Visit: Payer: Self-pay | Admitting: Internal Medicine

## 2021-02-16 DIAGNOSIS — R5381 Other malaise: Secondary | ICD-10-CM

## 2021-03-01 DIAGNOSIS — G4733 Obstructive sleep apnea (adult) (pediatric): Secondary | ICD-10-CM | POA: Diagnosis not present

## 2021-03-09 DIAGNOSIS — D6869 Other thrombophilia: Secondary | ICD-10-CM | POA: Diagnosis not present

## 2021-03-09 DIAGNOSIS — G4733 Obstructive sleep apnea (adult) (pediatric): Secondary | ICD-10-CM | POA: Diagnosis not present

## 2021-03-09 DIAGNOSIS — E559 Vitamin D deficiency, unspecified: Secondary | ICD-10-CM | POA: Diagnosis not present

## 2021-03-09 DIAGNOSIS — R197 Diarrhea, unspecified: Secondary | ICD-10-CM | POA: Diagnosis not present

## 2021-03-09 DIAGNOSIS — E782 Mixed hyperlipidemia: Secondary | ICD-10-CM | POA: Diagnosis not present

## 2021-03-09 DIAGNOSIS — Z7901 Long term (current) use of anticoagulants: Secondary | ICD-10-CM | POA: Diagnosis not present

## 2021-03-09 DIAGNOSIS — E1165 Type 2 diabetes mellitus with hyperglycemia: Secondary | ICD-10-CM | POA: Diagnosis not present

## 2021-03-09 DIAGNOSIS — Z7984 Long term (current) use of oral hypoglycemic drugs: Secondary | ICD-10-CM | POA: Diagnosis not present

## 2021-03-09 DIAGNOSIS — E538 Deficiency of other specified B group vitamins: Secondary | ICD-10-CM | POA: Diagnosis not present

## 2021-03-09 DIAGNOSIS — E11319 Type 2 diabetes mellitus with unspecified diabetic retinopathy without macular edema: Secondary | ICD-10-CM | POA: Diagnosis not present

## 2021-03-09 DIAGNOSIS — Z1389 Encounter for screening for other disorder: Secondary | ICD-10-CM | POA: Diagnosis not present

## 2021-03-09 DIAGNOSIS — H3530 Unspecified macular degeneration: Secondary | ICD-10-CM | POA: Diagnosis not present

## 2021-03-09 DIAGNOSIS — M109 Gout, unspecified: Secondary | ICD-10-CM | POA: Diagnosis not present

## 2021-03-09 DIAGNOSIS — Z0000 Encounter for general adult medical examination without abnormal findings: Secondary | ICD-10-CM | POA: Diagnosis not present

## 2021-03-09 DIAGNOSIS — I4891 Unspecified atrial fibrillation: Secondary | ICD-10-CM | POA: Diagnosis not present

## 2021-03-13 ENCOUNTER — Encounter (INDEPENDENT_AMBULATORY_CARE_PROVIDER_SITE_OTHER): Payer: Medicare Other | Admitting: Ophthalmology

## 2021-03-13 ENCOUNTER — Other Ambulatory Visit: Payer: Self-pay

## 2021-03-13 DIAGNOSIS — H353112 Nonexudative age-related macular degeneration, right eye, intermediate dry stage: Secondary | ICD-10-CM | POA: Diagnosis not present

## 2021-03-13 DIAGNOSIS — I10 Essential (primary) hypertension: Secondary | ICD-10-CM

## 2021-03-13 DIAGNOSIS — H353221 Exudative age-related macular degeneration, left eye, with active choroidal neovascularization: Secondary | ICD-10-CM

## 2021-03-13 DIAGNOSIS — H43813 Vitreous degeneration, bilateral: Secondary | ICD-10-CM | POA: Diagnosis not present

## 2021-03-13 DIAGNOSIS — H35372 Puckering of macula, left eye: Secondary | ICD-10-CM

## 2021-03-13 DIAGNOSIS — H35033 Hypertensive retinopathy, bilateral: Secondary | ICD-10-CM

## 2021-03-16 DIAGNOSIS — Z7901 Long term (current) use of anticoagulants: Secondary | ICD-10-CM | POA: Diagnosis not present

## 2021-03-21 ENCOUNTER — Emergency Department (HOSPITAL_COMMUNITY)
Admission: EM | Admit: 2021-03-21 | Discharge: 2021-03-21 | Disposition: A | Discharge status: Home / Self Care | Payer: Medicare Other | Attending: Emergency Medicine | Admitting: Emergency Medicine

## 2021-03-21 ENCOUNTER — Emergency Department (HOSPITAL_COMMUNITY): Payer: Medicare Other

## 2021-03-21 ENCOUNTER — Encounter (HOSPITAL_COMMUNITY): Payer: Self-pay

## 2021-03-21 ENCOUNTER — Other Ambulatory Visit: Payer: Self-pay

## 2021-03-21 DIAGNOSIS — I480 Paroxysmal atrial fibrillation: Secondary | ICD-10-CM | POA: Insufficient documentation

## 2021-03-21 DIAGNOSIS — Z20822 Contact with and (suspected) exposure to covid-19: Secondary | ICD-10-CM | POA: Insufficient documentation

## 2021-03-21 DIAGNOSIS — R1084 Generalized abdominal pain: Secondary | ICD-10-CM

## 2021-03-21 DIAGNOSIS — R0602 Shortness of breath: Secondary | ICD-10-CM | POA: Diagnosis not present

## 2021-03-21 DIAGNOSIS — Z7901 Long term (current) use of anticoagulants: Secondary | ICD-10-CM | POA: Diagnosis not present

## 2021-03-21 DIAGNOSIS — R531 Weakness: Secondary | ICD-10-CM | POA: Diagnosis not present

## 2021-03-21 DIAGNOSIS — Z96653 Presence of artificial knee joint, bilateral: Secondary | ICD-10-CM | POA: Diagnosis not present

## 2021-03-21 DIAGNOSIS — Z7984 Long term (current) use of oral hypoglycemic drugs: Secondary | ICD-10-CM | POA: Diagnosis not present

## 2021-03-21 DIAGNOSIS — Z79899 Other long term (current) drug therapy: Secondary | ICD-10-CM | POA: Diagnosis not present

## 2021-03-21 DIAGNOSIS — R0609 Other forms of dyspnea: Secondary | ICD-10-CM | POA: Diagnosis not present

## 2021-03-21 DIAGNOSIS — J9811 Atelectasis: Secondary | ICD-10-CM | POA: Diagnosis not present

## 2021-03-21 DIAGNOSIS — R001 Bradycardia, unspecified: Secondary | ICD-10-CM | POA: Diagnosis not present

## 2021-03-21 DIAGNOSIS — R609 Edema, unspecified: Secondary | ICD-10-CM | POA: Diagnosis not present

## 2021-03-21 DIAGNOSIS — K8020 Calculus of gallbladder without cholecystitis without obstruction: Secondary | ICD-10-CM | POA: Diagnosis not present

## 2021-03-21 DIAGNOSIS — R42 Dizziness and giddiness: Secondary | ICD-10-CM | POA: Insufficient documentation

## 2021-03-21 DIAGNOSIS — I10 Essential (primary) hypertension: Secondary | ICD-10-CM | POA: Insufficient documentation

## 2021-03-21 DIAGNOSIS — R109 Unspecified abdominal pain: Secondary | ICD-10-CM | POA: Diagnosis not present

## 2021-03-21 DIAGNOSIS — I4891 Unspecified atrial fibrillation: Secondary | ICD-10-CM | POA: Diagnosis not present

## 2021-03-21 DIAGNOSIS — E119 Type 2 diabetes mellitus without complications: Secondary | ICD-10-CM | POA: Insufficient documentation

## 2021-03-21 LAB — COMPREHENSIVE METABOLIC PANEL
ALT: 28 U/L (ref 0–44)
AST: 32 U/L (ref 15–41)
Albumin: 3.7 g/dL (ref 3.5–5.0)
Alkaline Phosphatase: 64 U/L (ref 38–126)
Anion gap: 10 (ref 5–15)
BUN: 23 mg/dL (ref 8–23)
CO2: 24 mmol/L (ref 22–32)
Calcium: 9.4 mg/dL (ref 8.9–10.3)
Chloride: 103 mmol/L (ref 98–111)
Creatinine, Ser: 0.84 mg/dL (ref 0.44–1.00)
GFR, Estimated: >60 mL/min (ref >60)
Glucose, Bld: 98 mg/dL (ref 70–99)
Potassium: 5.1 mmol/L (ref 3.5–5.1)
Sodium: 137 mmol/L (ref 135–145)
Total Bilirubin: 0.6 mg/dL (ref 0.3–1.2)
Total Protein: 7.2 g/dL (ref 6.5–8.1)

## 2021-03-21 LAB — CBC WITH DIFFERENTIAL/PLATELET
Abs Immature Granulocytes: 0.05 10*3/uL (ref 0.00–0.07)
Basophils Absolute: 0 10*3/uL (ref 0.0–0.1)
Basophils Relative: 0 %
Eosinophils Absolute: 0 10*3/uL (ref 0.0–0.5)
Eosinophils Relative: 1 %
HCT: 41 % (ref 36.0–46.0)
Hemoglobin: 12.6 g/dL (ref 12.0–15.0)
Immature Granulocytes: 1 %
Lymphocytes Relative: 18 %
Lymphs Abs: 0.8 10*3/uL (ref 0.7–4.0)
MCH: 28.2 pg (ref 26.0–34.0)
MCHC: 30.7 g/dL (ref 30.0–36.0)
MCV: 91.7 fL (ref 80.0–100.0)
Monocytes Absolute: 0.4 10*3/uL (ref 0.1–1.0)
Monocytes Relative: 8 %
Neutro Abs: 3.2 10*3/uL (ref 1.7–7.7)
Neutrophils Relative %: 72 %
Platelets: 95 10*3/uL — ABNORMAL LOW (ref 150–400)
RBC: 4.47 MIL/uL (ref 3.87–5.11)
RDW: 17.5 % — ABNORMAL HIGH (ref 11.5–15.5)
WBC: 4.4 10*3/uL (ref 4.0–10.5)
nRBC: 0 % (ref 0.0–0.2)

## 2021-03-21 LAB — URINALYSIS, ROUTINE W REFLEX MICROSCOPIC
Bilirubin Urine: NEGATIVE
Glucose, UA: NEGATIVE mg/dL
Hgb urine dipstick: NEGATIVE
Ketones, ur: NEGATIVE mg/dL
Leukocytes,Ua: NEGATIVE
Nitrite: NEGATIVE
Protein, ur: NEGATIVE mg/dL
Specific Gravity, Urine: 1.01 (ref 1.005–1.030)
pH: 6 (ref 5.0–8.0)

## 2021-03-21 LAB — PROTIME-INR
INR: 1.7 — ABNORMAL HIGH (ref 0.8–1.2)
Prothrombin Time: 20 seconds — ABNORMAL HIGH (ref 11.4–15.2)

## 2021-03-21 LAB — RESP PANEL BY RT-PCR (FLU A&B, COVID) ARPGX2
Influenza A by PCR: NEGATIVE
Influenza B by PCR: NEGATIVE
SARS Coronavirus 2 by RT PCR: NEGATIVE

## 2021-03-21 LAB — TROPONIN I (HIGH SENSITIVITY)
Troponin I (High Sensitivity): 4 ng/L (ref <18)
Troponin I (High Sensitivity): 6 ng/L (ref <18)

## 2021-03-21 LAB — BRAIN NATRIURETIC PEPTIDE: B Natriuretic Peptide: 145.5 pg/mL — ABNORMAL HIGH (ref 0.0–100.0)

## 2021-03-21 MED ORDER — SODIUM CHLORIDE 0.9 % IV BOLUS
1000.0000 mL | Freq: Once | INTRAVENOUS | Status: AC
  Administered 2021-03-21: 1000 mL via INTRAVENOUS

## 2021-03-21 MED ORDER — IOHEXOL 350 MG/ML SOLN
100.0000 mL | Freq: Once | INTRAVENOUS | Status: AC | PRN
  Administered 2021-03-21: 100 mL via INTRAVENOUS

## 2021-03-21 NOTE — ED Notes (Signed)
Ambulated pt with walker, pt exhibits strong steady gait.

## 2021-03-21 NOTE — ED Triage Notes (Signed)
Patient arrived by ems with increased weakness x 5-6 days. Patient reports increased SOB with exertion. Patient reports chest pain earlier today-patient states that her family found her to have HR 30, ems states they noticed 1 episode of 38 and the rest of the ride HR 50s

## 2021-03-21 NOTE — Discharge Instructions (Signed)
If you develop worsening, continued, or recurrent abdominal pain, uncontrolled vomiting, fever, chest or back pain, or any other new/concerning symptoms then return to the ER for evaluation.  

## 2021-03-21 NOTE — ED Provider Notes (Signed)
Emergency Medicine Provider Triage Evaluation Note  Natasha Glass , a 80 y.o. female  was evaluated in triage.  Pt complains of Weakness for the last few days. Reports her hr was 38 today and she was more sob than normal.   Review of Systems  Positive: Generalized weakness, sob, bradycardia Negative: fever  Physical Exam  There were no vitals taken for this visit. Gen:   Awake, no distress   Resp:  Normal effort  MSK:   Moves extremities without difficulty  Medical Decision Making  Medically screening exam initiated at 4:18 PM.  Appropriate orders placed.  Jonice Cerra Florentino was informed that the remainder of the evaluation will be completed by another provider, this initial triage assessment does not replace that evaluation, and the importance of remaining in the ED until their evaluation is complete.     Rodney Booze, PA-C 03/21/21 1634    Lajean Saver, MD 03/23/21 [phone]

## 2021-03-21 NOTE — ED Provider Notes (Signed)
[facility] EMERGENCY DEPARTMENT Provider Note   CSN: [id number] Arrival date & time: 03/21/21  1608     History No chief complaint on file.   Natasha Glass is a 80 y.o. female.  HPI 80 year old female presents with multiple complaints over the last few days.  Chief among them is lightheadedness.  Worse when she stands or gets up.  She feels diffusely weak.  She also had some dyspnea on exertion over the last 48 hours or so.  No cough, fever or chest pain.  Chronically has leg swelling that is unchanged.  She is also been noticing some abdominal pain for about a day.  Has been having diarrhea.  She is on warfarin but her INR was around 5 last week and had meds adjusted.  She also has been taking Bactrim for presumed UTI for the last 1 week.  Just stopped this yesterday.  She states that it did seem to help and she no longer has UTI symptoms.  Past Medical History:  Diagnosis Date  . Anemia   . Arthritis    "legs, hands, knees, back" (04/10/2016)  . Atrial fibrillation ([place]) 08/2018  . DVT (deep venous thrombosis) ([place]) 01/2009  . Dyspnea    due to weight   . GERD (gastroesophageal reflux disease)    occ.  . Gout   . Headache    "at least weekly" (04/10/2016)  . History of blood transfusion 1990s   "related to ulcerative colitis"  . History of colonic polyps 09/2010  . Hyperlipidemia   . Hypertension   . Lactose intolerance   . Macular degeneration, dry    bilateral  . Migraine    "q 2-3 months" (04/10/2016)  . Obesity   . Osteopenia   . Peripheral neuropathy   . Pneumonia 1970s  . PONV (postoperative nausea and vomiting)    hx of years ago none recent   . Pulmonary embolism ([place]) 01/2009  . Shortness of breath on exertion   . Sleep apnea    cpap pressure 4.0-16.0   . Type II diabetes mellitus ([place])    type II   . 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 Urinary incontinence    wears peripad    Patient Active Problem List   Diagnosis  Date Noted  . 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/2019  . 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 Pain in joint of right shoulder 09/22/2018  . Persistent atrial fibrillation ([place]) 08/18/2018  . PAF (paroxysmal atrial fibrillation) ([place])   . 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 Acute respiratory failure with hypoxia ([place]) 04/11/2016  . UTI (lower urinary tract infection) 04/10/2016  . Generalized weakness 04/10/2016  . Nausea 04/10/2016  . Diabetes ([place]) 04/10/2016  . Diabetes mellitus with complication ([place])   . Ascending aortic aneurysm ([place]) 05/18/2014  . Ulcerative colitis ([place]) 05/07/2012  . Obesity 05/07/2012  . Hypertension 05/07/2012  . History of pulmonary embolism 05/07/2012    Past Surgical History:  Procedure Laterality Date  . ABDOMINAL EXPLORATION SURGERY  1960  . ABDOMINAL HYSTERECTOMY  1986  . CARDIAC CATHETERIZATION    . CARDIOVERSION N/A 05/07/2018   Procedure: CARDIOVERSION;  Surgeon: Josue Hector, MD;  Location: [facility] ENDOSCOPY;  Service: Cardiovascular;  Laterality: N/A;  . COLONOSCOPY W/ BIOPSIES AND POLYPECTOMY  2005; 2011; 2012  . COLONOSCOPY WITH PROPOFOL N/A 09/10/2016   Procedure: COLONOSCOPY WITH PROPOFOL;  Surgeon: Garlan Fair, MD;  Location: [facility] ENDOSCOPY;  Service: Endoscopy;  Laterality: N/A;  . JOINT REPLACEMENT    . KNEE ARTHROSCOPY Left ~ 1999  . SHOULDER OPEN ROTATOR CUFF REPAIR Left 2011  . TOTAL KNEE ARTHROPLASTY Bilateral 2005-2012   left-right     OB History   No obstetric history on file.     Family History  Problem Relation Age of Onset  . Colon cancer Mother   . Lung cancer Mother   . Heart disease Father     Social History   Tobacco Use  . Smoking status: Never Smoker  . Smokeless tobacco: Never Used  Vaping Use  . Vaping Use: Never used  Substance Use Topics  . Alcohol use: No  . Drug use: No    Home Medications Prior  to Admission medications   Medication Sig Start Date End Date Taking? Authorizing Provider  acetaminophen (TYLENOL) 500 MG tablet Take 500 mg by mouth at bedtime.    [provider]  allopurinol (ZYLOPRIM) 300 MG tablet Take 150 mg by mouth every evening.  04/19/14   [provider]  Cholecalciferol (VITAMIN D3) 25 MCG (1000 UT) CAPS Take 3 capsules by mouth daily in the afternoon.    [provider]  clobetasol cream (TEMOVATE) 2.83 % Apply 1 application topically as needed.    [provider]  clonazePAM (KLONOPIN) 0.5 MG tablet Take 0.5 mg by mouth as needed. 06/22/20   [provider]  dofetilide (TIKOSYN) 250 MCG capsule TAKE 1 CAPSULE BY MOUTH TWICE DAILY 01/23/21   Sherran Needs, NP  doxycycline (VIBRA-TABS) 100 MG tablet Take 100 mg by mouth 2 (two) times daily. 09/18/20   [provider]  HYDROcodone-acetaminophen (NORCO/VICODIN) 5-325 MG tablet Take 1 tablet by mouth as needed. 08/28/19   [provider]  Hyprom-Naphaz-Polysorb-Zn Sulf (CLEAR EYES COMPLETE) SOLN 2 drops in both eyes    [provider]  losartan (COZAAR) 25 MG tablet Take 25 mg by mouth daily.    [provider]  magnesium oxide (MAG-OX) 400 (241.3 Mg) MG tablet Take 1 tablet (400 mg total) by mouth daily. 08/11/20   Sherran Needs, NP  metFORMIN (GLUCOPHAGE) 500 MG tablet Take 500 mg by mouth 2 (two) times daily with a meal.  04/19/14   [provider]  metoprolol tartrate (LOPRESSOR) 25 MG tablet Take 25-50 mg by mouth See admin instructions. Take 2 tablets in the morning, and 2 tablets in the evening.    [provider]  Multiple Vitamins-Minerals (PRESERVISION AREDS 2) CAPS Take 2 capsules by mouth daily.    [provider]  Naphazoline HCl (CLEAR EYES OP) Place 2 drops into both eyes 2 (two) times daily as needed (for dry eyes).    [provider]  [facility] ULTRA test strip  08/16/19   [provider]  potassium chloride SA (K-DUR,KLOR-CON) 20 MEQ tablet Take 1.5 tablets (30 mEq total) by mouth daily. Patient taking differently: Take 20 mEq by mouth daily. 08/22/18   Baldwin Jamaica, PA-C  SSD 1 % cream Apply topically 2 (two) times daily. 08/15/20   [provider]  vitamin B-12 (CYANOCOBALAMIN) 500 MCG tablet Take 500 mcg by mouth daily.    [provider]  VITAMIN D PO Take 3,000 Units by mouth daily.    [provider]  warfarin (COUMADIN) 5 MG tablet Take 5 mg by mouth daily.     [provider]    Allergies    Morphine and  related, Oysters [shellfish allergy], Amlodipine, Percocet [oxycodone-acetaminophen], Tdap [tetanus-diphth-acell pertussis], Tetanus toxoids, Tramadol, Carbamazepine, Lactose intolerance (gi), Macrodantin [nitrofurantoin], Tetanus-diphtheria toxoids td, Topiramate, Diovan [valsartan], Diphtheria toxoid, and Macrodantin [nitrofurantoin macrocrystal]  Review of Systems   Review of Systems  Constitutional: Negative for fever.  Respiratory: Positive for shortness of breath. Negative for cough.   Cardiovascular: Negative for chest pain.  Gastrointestinal: Positive for abdominal pain and diarrhea. Negative for vomiting.  Genitourinary: Negative for dysuria.  Neurological: Positive for weakness and light-headedness. Negative for dizziness.  All other systems reviewed and are negative.   Physical Exam Updated Vital Signs BP (!) 162/109   Pulse 61   Temp 98.1 F (36.7 C) (Oral)   Resp (!) 22   Ht 5' 3"  (1.6 m)   Wt 129.7 kg   SpO2 97%   BMI 50.66 kg/m   Physical Exam Vitals and nursing note reviewed.  Constitutional:      General: She is not in acute distress.    Appearance: She is well-developed. She is obese. She is not ill-appearing or diaphoretic.  HENT:     Head: Normocephalic and atraumatic.     Right Ear: External ear normal.     Left Ear: External ear normal.     Nose: Nose normal.  Eyes:     General:         Right eye: No discharge.        Left eye: No discharge.     Extraocular Movements: Extraocular movements intact.     Pupils: Pupils are equal, round, and reactive to light.  Cardiovascular:     Rate and Rhythm: Regular rhythm. Bradycardia present.     Heart sounds: Normal heart sounds.  Pulmonary:     Effort: Pulmonary effort is normal.     Breath sounds: Normal breath sounds. No wheezing.  Abdominal:     Palpations: Abdomen is soft.     Tenderness: There is abdominal tenderness.     Comments: Generalized, worst in upper abdomen.  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  Neurological:     Mental Status: She is alert.     Comments: CN 3-12 grossly intact. 5/5 strength in all 4 extremities. Grossly normal sensation. Normal finger to nose.   Psychiatric:        Mood and Affect: Mood is not anxious.     ED Results / Procedures / Treatments   Labs (all labs ordered are listed, but only abnormal results are displayed) Labs Reviewed  CBC WITH DIFFERENTIAL/PLATELET - Abnormal; Notable for the following components:      Result Value   RDW 17.5 (*)    Platelets 95 (*)    All other components within normal limits  PROTIME-INR - Abnormal; Notable for the following components:   Prothrombin Time 20.0 (*)    INR 1.7 (*)    All other components within normal limits  BRAIN NATRIURETIC PEPTIDE - Abnormal; Notable for the following components:   B Natriuretic Peptide 145.5 (*)    All other components within normal limits  RESP PANEL BY RT-PCR (FLU A&B, COVID) ARPGX2  COMPREHENSIVE METABOLIC PANEL  URINALYSIS, ROUTINE W REFLEX MICROSCOPIC  TROPONIN I (HIGH SENSITIVITY)  TROPONIN I (HIGH SENSITIVITY)    EKG EKG Interpretation  Date/Time:  Wednesday March 21 2021 16:14:46 EDT Ventricular Rate:  52 PR Interval:  226 QRS Duration: 96 QT Interval:  482 QTC  Calculation: 448 R  Axis:   39 Text Interpretation: Sinus bradycardia with sinus arrhythmia with 1st degree A-V block Low voltage QRS Incomplete right bundle branch block Borderline ECG Confirmed by Sherwood Gambler [phone]) on 03/21/2021 6:15:21 PM   Radiology DG Chest 2 View  Result Date: 03/21/2021 CLINICAL DATA:  Shortness of breath, weakness. EXAM: CHEST - 2 VIEW COMPARISON:  September 21, 2018 FINDINGS: Image rotated slightly to the RIGHT. Accounting for this cardiomediastinal contours and hilar structures are stable with mild cardiac enlargement. Subtle opacities at the RIGHT lung base. No lobar consolidation. No sign of pleural effusion. On limited assessment no acute skeletal process. IMPRESSION: Subtle opacities at the RIGHT lung base may represent atelectasis or developing infection. Electronically Signed   By: Zetta Bills M.D.   On: 03/21/2021 17:27   CT Angio Chest PE W and/or Wo Contrast  Result Date: 03/21/2021 CLINICAL DATA:  PE suspected, high prob Shortness of breath. EXAM: CT ANGIOGRAPHY CHEST WITH CONTRAST TECHNIQUE: Multidetector CT imaging of the chest was performed using the standard protocol during bolus administration of intravenous contrast. Multiplanar CT image reconstructions and MIPs were obtained to evaluate the vascular anatomy. CONTRAST:  141m OMNIPAQUE IOHEXOL 350 MG/ML SOLN COMPARISON:  Radiograph earlier this day. Most recent chest CTA 08/09/2020 FINDINGS: Cardiovascular: There are no filling defects within the pulmonary arteries to suggest pulmonary embolus. Stable size and configuration of ascending aortic aneurysm maximal dimension 4.4 cm. Mild aortic atherosclerosis. Conventional branching pattern from the aortic arch. Upper normal heart size. Mitral annulus calcifications. Coronary artery calcifications. No pericardial effusion. Mediastinum/Nodes: No enlarged mediastinal or hilar lymph nodes. No esophageal wall thickening. No thyroid nodule. Lungs/Pleura: Mild atelectasis in both lower  lobes and lingula. No evidence of pneumonia. Left upper lobe calcified granuloma again seen. Stable small subpleural opacity in the posterior right upper lobe, series 6, image 30. Trachea and central bronchi are patent. No pleural fluid. No findings of pulmonary edema. Mild central bronchial thickening. Upper Abdomen: Assessed on concurrent abdominal CT, reported separately. Musculoskeletal: Diffuse thoracic spondylosis with endplate spurring. There are no acute or suspicious osseous abnormalities. Review of the MIP images confirms the above findings. IMPRESSION: 1. No pulmonary embolus. 2. Stable size and configuration of ascending aortic aneurysm, maximal dimension 4.4 cm. 3. Mild central bronchial thickening. Mild atelectasis in both lower lobes and lingula. 4. Coronary artery calcifications. Aortic Atherosclerosis (ICD10-I70.0). Electronically Signed   By: MKeith RakeM.D.   On: 03/21/2021 20:43   CT ABDOMEN PELVIS W CONTRAST  Result Date: 03/21/2021 CLINICAL DATA:  Acute abdominal pain. EXAM: CT ABDOMEN AND PELVIS WITH CONTRAST TECHNIQUE: Multidetector CT imaging of the abdomen and pelvis was performed using the standard protocol following bolus administration of intravenous contrast. CONTRAST:  1046mOMNIPAQUE IOHEXOL 350 MG/ML SOLN COMPARISON:  No prior abdominal CT. Performed concurrently with chest CTA, reported separately. FINDINGS: Lower chest: Assessed fully on concurrent chest exam. Hepatobiliary: No focal hepatic abnormality. Layering stones or sludge in the gallbladder without CT findings of acute cholecystitis. There is no biliary dilatation. Pancreas: Fatty atrophy.  No ductal dilatation or inflammation. Spleen: Normal in size without focal abnormality. Adrenals/Urinary Tract: 15 mm low-density right adrenal nodule. Normal left adrenal gland. Extrarenal pelvis configuration of the right kidney is well as parapelvic cyst. No hydronephrosis. No perinephric edema or renal calculi. Decompressed  ureters. Urinary bladder is partially distended. No bladder wall thickening. Stomach/Bowel: Decompressed stomach. Normal positioning of the duodenum and ligament of Treitz. Unremarkable small bowel without obstruction or inflammation. Appendix  not visualized, no appendicitis. Transverse and sigmoid colonic redundancy. Small to moderate colonic stool burden. No colonic wall thickening or inflammation. Vascular/Lymphatic: Normal caliber abdominal aorta. Aortic atherosclerosis. Patent portal vein. No acute vascular findings. No enlarged lymph nodes in the abdomen or pelvis. Reproductive: The uterus is surgically absent. There is no adnexal mass. Other: No free air, free fluid, or intra-abdominal fluid collection. Small fat containing umbilical hernia. Musculoskeletal: The bones are diffusely under mineralized. Diffuse degenerative disc disease and facet hypertrophy in the spine. There are no acute or suspicious osseous abnormalities. Mild fatty atrophy of gluteal musculature. IMPRESSION: 1. No acute abnormality in the abdomen/pelvis. 2. Layering stones or sludge in the gallbladder without CT findings of acute cholecystitis. 3. 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 4. Colonic redundancy. Aortic Atherosclerosis (ICD10-I70.0). Electronically Signed   By: Keith Rake M.D.   On: 03/21/2021 20:36    Procedures Procedures   Medications Ordered in ED Medications  sodium chloride 0.9 % bolus 1,000 mL (1,000 mLs Intravenous New Bag/Given 03/21/21 2004)  iohexol (OMNIPAQUE) 350 MG/ML injection 100 mL (100 mLs Intravenous Contrast Given 03/21/21 2026)    ED Course  I have reviewed the triage vital signs and the nursing notes.  Pertinent labs & imaging results that were available during my care of the patient were reviewed by me and considered in my medical decision making (see chart for details).    MDM Rules/Calculators/A&P                          Patient's work-up is overall unremarkable.   CTA shows no obvious pulmonary findings that would suggest why she is short of breath.  Troponins negative x2.  CT of the abdomen and pelvis does show some stones/sludge in her gallbladder though no inflammatory findings.  WBC is normal and LFTs are normal.  My suspicion this is an acute cholecystitis is quite low.  She is able to ambulate without difficulty.  There was report of maybe she had some transient bradycardia in the 30s though this is never been documented in the ED and there is noticed rhythm strips from EMS.  Her lightheadedness sounds more than just spotty though and my suspicion that she has significant bradycardia is pretty low.  I do wonder if she has too many medication interactions with the numerous meds she is on plus the Bactrim she has been taking.  She will need to follow-up with her PCP for this. Final Clinical Impression(s) / ED Diagnoses Final diagnoses:  Generalized abdominal pain  Dyspnea on exertion  Calculus of gallbladder without cholecystitis without obstruction    Rx / DC Orders ED Discharge Orders    None       Sherwood Gambler, MD 03/21/21 2243

## 2021-03-26 ENCOUNTER — Other Ambulatory Visit: Payer: Self-pay | Admitting: Internal Medicine

## 2021-03-26 DIAGNOSIS — M810 Age-related osteoporosis without current pathological fracture: Secondary | ICD-10-CM | POA: Diagnosis not present

## 2021-03-26 DIAGNOSIS — Z1231 Encounter for screening mammogram for malignant neoplasm of breast: Secondary | ICD-10-CM

## 2021-03-26 DIAGNOSIS — I4891 Unspecified atrial fibrillation: Secondary | ICD-10-CM | POA: Diagnosis not present

## 2021-03-26 DIAGNOSIS — E1165 Type 2 diabetes mellitus with hyperglycemia: Secondary | ICD-10-CM | POA: Diagnosis not present

## 2021-03-26 DIAGNOSIS — G43009 Migraine without aura, not intractable, without status migrainosus: Secondary | ICD-10-CM | POA: Diagnosis not present

## 2021-03-26 DIAGNOSIS — I10 Essential (primary) hypertension: Secondary | ICD-10-CM | POA: Diagnosis not present

## 2021-03-26 DIAGNOSIS — E0840 Diabetes mellitus due to underlying condition with diabetic neuropathy, unspecified: Secondary | ICD-10-CM | POA: Diagnosis not present

## 2021-03-26 DIAGNOSIS — E11319 Type 2 diabetes mellitus with unspecified diabetic retinopathy without macular edema: Secondary | ICD-10-CM | POA: Diagnosis not present

## 2021-03-26 DIAGNOSIS — E782 Mixed hyperlipidemia: Secondary | ICD-10-CM | POA: Diagnosis not present

## 2021-04-10 ENCOUNTER — Encounter (INDEPENDENT_AMBULATORY_CARE_PROVIDER_SITE_OTHER): Payer: Medicare Other | Admitting: Ophthalmology

## 2021-04-10 ENCOUNTER — Other Ambulatory Visit: Payer: Self-pay

## 2021-04-10 DIAGNOSIS — H353221 Exudative age-related macular degeneration, left eye, with active choroidal neovascularization: Secondary | ICD-10-CM

## 2021-04-10 DIAGNOSIS — H43813 Vitreous degeneration, bilateral: Secondary | ICD-10-CM | POA: Diagnosis not present

## 2021-04-10 DIAGNOSIS — H35033 Hypertensive retinopathy, bilateral: Secondary | ICD-10-CM | POA: Diagnosis not present

## 2021-04-10 DIAGNOSIS — I10 Essential (primary) hypertension: Secondary | ICD-10-CM

## 2021-04-10 DIAGNOSIS — H353112 Nonexudative age-related macular degeneration, right eye, intermediate dry stage: Secondary | ICD-10-CM | POA: Diagnosis not present

## 2021-04-18 ENCOUNTER — Other Ambulatory Visit: Payer: Self-pay

## 2021-04-18 ENCOUNTER — Encounter (HOSPITAL_COMMUNITY): Payer: Self-pay | Admitting: Nurse Practitioner

## 2021-04-18 ENCOUNTER — Ambulatory Visit (HOSPITAL_COMMUNITY)
Admission: RE | Admit: 2021-04-18 | Discharge: 2021-04-18 | Disposition: A | Discharge status: Home / Self Care | Payer: Medicare Other | Source: Ambulatory Visit | Attending: Nurse Practitioner | Admitting: Nurse Practitioner

## 2021-04-18 VITALS — BP 118/72 | HR 59 | Ht 63.0 in | Wt 287.4 lb

## 2021-04-18 DIAGNOSIS — Z7901 Long term (current) use of anticoagulants: Secondary | ICD-10-CM | POA: Diagnosis not present

## 2021-04-18 DIAGNOSIS — E669 Obesity, unspecified: Secondary | ICD-10-CM | POA: Insufficient documentation

## 2021-04-18 DIAGNOSIS — I4819 Other persistent atrial fibrillation: Secondary | ICD-10-CM | POA: Diagnosis not present

## 2021-04-18 DIAGNOSIS — D6869 Other thrombophilia: Secondary | ICD-10-CM | POA: Diagnosis not present

## 2021-04-18 DIAGNOSIS — Z713 Dietary counseling and surveillance: Secondary | ICD-10-CM | POA: Insufficient documentation

## 2021-04-18 DIAGNOSIS — G4733 Obstructive sleep apnea (adult) (pediatric): Secondary | ICD-10-CM | POA: Insufficient documentation

## 2021-04-18 DIAGNOSIS — I10 Essential (primary) hypertension: Secondary | ICD-10-CM | POA: Diagnosis not present

## 2021-04-18 DIAGNOSIS — I480 Paroxysmal atrial fibrillation: Secondary | ICD-10-CM

## 2021-04-18 DIAGNOSIS — Z6841 Body Mass Index (BMI) 40.0 and over, adult: Secondary | ICD-10-CM | POA: Diagnosis not present

## 2021-04-18 LAB — MAGNESIUM: Magnesium: 1.6 mg/dL — ABNORMAL LOW (ref 1.7–2.4)
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
## 2021-04-19 ENCOUNTER — Other Ambulatory Visit (HOSPITAL_COMMUNITY): Payer: Self-pay | Admitting: *Deleted

## 2021-04-19 MED ORDER — MAGNESIUM OXIDE 400 MG PO TABS: ORAL_TABLET | ORAL | Status: DC

## 2021-04-23 DIAGNOSIS — E782 Mixed hyperlipidemia: Secondary | ICD-10-CM | POA: Diagnosis not present

## 2021-04-23 DIAGNOSIS — E11319 Type 2 diabetes mellitus with unspecified diabetic retinopathy without macular edema: Secondary | ICD-10-CM | POA: Diagnosis not present

## 2021-04-23 DIAGNOSIS — M810 Age-related osteoporosis without current pathological fracture: Secondary | ICD-10-CM | POA: Diagnosis not present

## 2021-04-23 DIAGNOSIS — I4891 Unspecified atrial fibrillation: Secondary | ICD-10-CM | POA: Diagnosis not present

## 2021-04-23 DIAGNOSIS — I10 Essential (primary) hypertension: Secondary | ICD-10-CM | POA: Diagnosis not present

## 2021-04-23 DIAGNOSIS — G43009 Migraine without aura, not intractable, without status migrainosus: Secondary | ICD-10-CM | POA: Diagnosis not present

## 2021-04-23 DIAGNOSIS — E0840 Diabetes mellitus due to underlying condition with diabetic neuropathy, unspecified: Secondary | ICD-10-CM | POA: Diagnosis not present

## 2021-04-23 DIAGNOSIS — E1165 Type 2 diabetes mellitus with hyperglycemia: Secondary | ICD-10-CM | POA: Diagnosis not present

## 2021-05-02 ENCOUNTER — Ambulatory Visit (HOSPITAL_COMMUNITY)
Admission: RE | Admit: 2021-05-02 | Discharge: 2021-05-02 | Disposition: A | Discharge status: Home / Self Care | Payer: Medicare Other | Source: Ambulatory Visit | Attending: Nurse Practitioner | Admitting: Nurse Practitioner

## 2021-05-02 ENCOUNTER — Other Ambulatory Visit: Payer: Self-pay

## 2021-05-02 DIAGNOSIS — I4819 Other persistent atrial fibrillation: Secondary | ICD-10-CM | POA: Diagnosis not present

## 2021-05-02 LAB — MAGNESIUM: Magnesium: 2 mg/dL (ref 1.7–2.4)

## 2021-05-08 DIAGNOSIS — I4891 Unspecified atrial fibrillation: Secondary | ICD-10-CM | POA: Diagnosis not present

## 2021-05-08 DIAGNOSIS — Z7901 Long term (current) use of anticoagulants: Secondary | ICD-10-CM | POA: Diagnosis not present

## 2021-05-09 ENCOUNTER — Encounter (INDEPENDENT_AMBULATORY_CARE_PROVIDER_SITE_OTHER): Payer: Medicare Other | Admitting: Ophthalmology

## 2021-05-09 ENCOUNTER — Other Ambulatory Visit: Payer: Self-pay

## 2021-05-09 DIAGNOSIS — H353112 Nonexudative age-related macular degeneration, right eye, intermediate dry stage: Secondary | ICD-10-CM

## 2021-05-09 DIAGNOSIS — I10 Essential (primary) hypertension: Secondary | ICD-10-CM

## 2021-05-09 DIAGNOSIS — H35033 Hypertensive retinopathy, bilateral: Secondary | ICD-10-CM

## 2021-05-09 DIAGNOSIS — H43813 Vitreous degeneration, bilateral: Secondary | ICD-10-CM | POA: Diagnosis not present

## 2021-05-09 DIAGNOSIS — H353221 Exudative age-related macular degeneration, left eye, with active choroidal neovascularization: Secondary | ICD-10-CM | POA: Diagnosis not present

## 2021-05-10 DIAGNOSIS — G4733 Obstructive sleep apnea (adult) (pediatric): Secondary | ICD-10-CM | POA: Diagnosis not present

## 2021-05-18 ENCOUNTER — Ambulatory Visit
Admission: RE | Admit: 2021-05-18 | Discharge: 2021-05-18 | Disposition: A | Discharge status: Home / Self Care | Payer: Medicare Other | Source: Ambulatory Visit | Attending: Internal Medicine | Admitting: Internal Medicine

## 2021-05-18 ENCOUNTER — Other Ambulatory Visit: Payer: Self-pay | Admitting: Internal Medicine

## 2021-05-18 ENCOUNTER — Other Ambulatory Visit: Payer: Self-pay

## 2021-05-18 DIAGNOSIS — N63 Unspecified lump in unspecified breast: Secondary | ICD-10-CM

## 2021-05-18 DIAGNOSIS — Z1231 Encounter for screening mammogram for malignant neoplasm of breast: Secondary | ICD-10-CM

## 2021-05-29 DIAGNOSIS — B351 Tinea unguium: Secondary | ICD-10-CM | POA: Diagnosis not present

## 2021-05-29 DIAGNOSIS — E1142 Type 2 diabetes mellitus with diabetic polyneuropathy: Secondary | ICD-10-CM | POA: Diagnosis not present

## 2021-05-29 DIAGNOSIS — L84 Corns and callosities: Secondary | ICD-10-CM | POA: Diagnosis not present

## 2021-05-29 DIAGNOSIS — M79676 Pain in unspecified toe(s): Secondary | ICD-10-CM | POA: Diagnosis not present

## 2021-06-01 DIAGNOSIS — E11319 Type 2 diabetes mellitus with unspecified diabetic retinopathy without macular edema: Secondary | ICD-10-CM | POA: Diagnosis not present

## 2021-06-01 DIAGNOSIS — M810 Age-related osteoporosis without current pathological fracture: Secondary | ICD-10-CM | POA: Diagnosis not present

## 2021-06-01 DIAGNOSIS — M189 Osteoarthritis of first carpometacarpal joint, unspecified: Secondary | ICD-10-CM | POA: Diagnosis not present

## 2021-06-01 DIAGNOSIS — G43009 Migraine without aura, not intractable, without status migrainosus: Secondary | ICD-10-CM | POA: Diagnosis not present

## 2021-06-01 DIAGNOSIS — E782 Mixed hyperlipidemia: Secondary | ICD-10-CM | POA: Diagnosis not present

## 2021-06-01 DIAGNOSIS — M179 Osteoarthritis of knee, unspecified: Secondary | ICD-10-CM | POA: Diagnosis not present

## 2021-06-01 DIAGNOSIS — E1165 Type 2 diabetes mellitus with hyperglycemia: Secondary | ICD-10-CM | POA: Diagnosis not present

## 2021-06-01 DIAGNOSIS — E0840 Diabetes mellitus due to underlying condition with diabetic neuropathy, unspecified: Secondary | ICD-10-CM | POA: Diagnosis not present

## 2021-06-01 DIAGNOSIS — I10 Essential (primary) hypertension: Secondary | ICD-10-CM | POA: Diagnosis not present

## 2021-06-01 DIAGNOSIS — I4891 Unspecified atrial fibrillation: Secondary | ICD-10-CM | POA: Diagnosis not present

## 2021-06-04 DIAGNOSIS — G4733 Obstructive sleep apnea (adult) (pediatric): Secondary | ICD-10-CM | POA: Diagnosis not present

## 2021-06-07 ENCOUNTER — Other Ambulatory Visit: Payer: Self-pay

## 2021-06-07 ENCOUNTER — Encounter (INDEPENDENT_AMBULATORY_CARE_PROVIDER_SITE_OTHER): Payer: Medicare Other | Admitting: Ophthalmology

## 2021-06-07 DIAGNOSIS — I10 Essential (primary) hypertension: Secondary | ICD-10-CM

## 2021-06-07 DIAGNOSIS — H353221 Exudative age-related macular degeneration, left eye, with active choroidal neovascularization: Secondary | ICD-10-CM | POA: Diagnosis not present

## 2021-06-07 DIAGNOSIS — H43813 Vitreous degeneration, bilateral: Secondary | ICD-10-CM | POA: Diagnosis not present

## 2021-06-07 DIAGNOSIS — H35033 Hypertensive retinopathy, bilateral: Secondary | ICD-10-CM

## 2021-06-07 DIAGNOSIS — H353112 Nonexudative age-related macular degeneration, right eye, intermediate dry stage: Secondary | ICD-10-CM

## 2021-06-19 DIAGNOSIS — Z7901 Long term (current) use of anticoagulants: Secondary | ICD-10-CM | POA: Diagnosis not present

## 2021-06-25 ENCOUNTER — Other Ambulatory Visit: Payer: Self-pay | Admitting: *Deleted

## 2021-06-25 DIAGNOSIS — I712 Thoracic aortic aneurysm, without rupture, unspecified: Secondary | ICD-10-CM

## 2021-06-30 ENCOUNTER — Ambulatory Visit
Admission: RE | Admit: 2021-06-30 | Discharge: 2021-06-30 | Disposition: A | Discharge status: Home / Self Care | Payer: Medicare Other | Source: Ambulatory Visit | Attending: Internal Medicine | Admitting: Internal Medicine

## 2021-06-30 ENCOUNTER — Other Ambulatory Visit: Payer: Self-pay

## 2021-06-30 ENCOUNTER — Ambulatory Visit: Payer: Medicare Other

## 2021-06-30 DIAGNOSIS — R922 Inconclusive mammogram: Secondary | ICD-10-CM | POA: Diagnosis not present

## 2021-06-30 DIAGNOSIS — N63 Unspecified lump in unspecified breast: Secondary | ICD-10-CM

## 2021-07-05 ENCOUNTER — Other Ambulatory Visit: Payer: Self-pay

## 2021-07-05 ENCOUNTER — Encounter (INDEPENDENT_AMBULATORY_CARE_PROVIDER_SITE_OTHER): Payer: Medicare Other | Admitting: Ophthalmology

## 2021-07-05 DIAGNOSIS — H353221 Exudative age-related macular degeneration, left eye, with active choroidal neovascularization: Secondary | ICD-10-CM | POA: Diagnosis not present

## 2021-07-05 DIAGNOSIS — E1165 Type 2 diabetes mellitus with hyperglycemia: Secondary | ICD-10-CM | POA: Diagnosis not present

## 2021-07-05 DIAGNOSIS — I10 Essential (primary) hypertension: Secondary | ICD-10-CM | POA: Diagnosis not present

## 2021-07-05 DIAGNOSIS — E782 Mixed hyperlipidemia: Secondary | ICD-10-CM | POA: Diagnosis not present

## 2021-07-05 DIAGNOSIS — H353112 Nonexudative age-related macular degeneration, right eye, intermediate dry stage: Secondary | ICD-10-CM

## 2021-07-05 DIAGNOSIS — E0840 Diabetes mellitus due to underlying condition with diabetic neuropathy, unspecified: Secondary | ICD-10-CM | POA: Diagnosis not present

## 2021-07-05 DIAGNOSIS — H35033 Hypertensive retinopathy, bilateral: Secondary | ICD-10-CM

## 2021-07-05 DIAGNOSIS — H43813 Vitreous degeneration, bilateral: Secondary | ICD-10-CM | POA: Diagnosis not present

## 2021-07-05 DIAGNOSIS — E11319 Type 2 diabetes mellitus with unspecified diabetic retinopathy without macular edema: Secondary | ICD-10-CM | POA: Diagnosis not present

## 2021-07-05 DIAGNOSIS — M810 Age-related osteoporosis without current pathological fracture: Secondary | ICD-10-CM | POA: Diagnosis not present

## 2021-07-05 DIAGNOSIS — G43009 Migraine without aura, not intractable, without status migrainosus: Secondary | ICD-10-CM | POA: Diagnosis not present

## 2021-07-05 DIAGNOSIS — I4891 Unspecified atrial fibrillation: Secondary | ICD-10-CM | POA: Diagnosis not present

## 2021-07-05 DIAGNOSIS — M179 Osteoarthritis of knee, unspecified: Secondary | ICD-10-CM | POA: Diagnosis not present

## 2021-07-16 DIAGNOSIS — D6869 Other thrombophilia: Secondary | ICD-10-CM | POA: Diagnosis not present

## 2021-07-16 DIAGNOSIS — N390 Urinary tract infection, site not specified: Secondary | ICD-10-CM | POA: Diagnosis not present

## 2021-07-16 DIAGNOSIS — I4891 Unspecified atrial fibrillation: Secondary | ICD-10-CM | POA: Diagnosis not present

## 2021-07-16 DIAGNOSIS — L84 Corns and callosities: Secondary | ICD-10-CM | POA: Diagnosis not present

## 2021-07-16 DIAGNOSIS — Z23 Encounter for immunization: Secondary | ICD-10-CM | POA: Diagnosis not present

## 2021-07-16 DIAGNOSIS — Z7901 Long term (current) use of anticoagulants: Secondary | ICD-10-CM | POA: Diagnosis not present

## 2021-07-16 DIAGNOSIS — Z86718 Personal history of other venous thrombosis and embolism: Secondary | ICD-10-CM | POA: Diagnosis not present

## 2021-08-02 ENCOUNTER — Encounter (INDEPENDENT_AMBULATORY_CARE_PROVIDER_SITE_OTHER): Payer: Medicare Other | Admitting: Ophthalmology

## 2021-08-02 ENCOUNTER — Other Ambulatory Visit: Payer: Self-pay

## 2021-08-02 DIAGNOSIS — H35033 Hypertensive retinopathy, bilateral: Secondary | ICD-10-CM

## 2021-08-02 DIAGNOSIS — H353112 Nonexudative age-related macular degeneration, right eye, intermediate dry stage: Secondary | ICD-10-CM | POA: Diagnosis not present

## 2021-08-02 DIAGNOSIS — I10 Essential (primary) hypertension: Secondary | ICD-10-CM | POA: Diagnosis not present

## 2021-08-02 DIAGNOSIS — H353221 Exudative age-related macular degeneration, left eye, with active choroidal neovascularization: Secondary | ICD-10-CM

## 2021-08-02 DIAGNOSIS — H35372 Puckering of macula, left eye: Secondary | ICD-10-CM

## 2021-08-02 DIAGNOSIS — H43813 Vitreous degeneration, bilateral: Secondary | ICD-10-CM | POA: Diagnosis not present

## 2021-08-06 ENCOUNTER — Other Ambulatory Visit: Payer: Self-pay

## 2021-08-06 ENCOUNTER — Ambulatory Visit: Payer: Medicare Other | Admitting: Physician Assistant

## 2021-08-06 VITALS — BP 137/65 | HR 93 | Ht 63.0 in | Wt 283.0 lb

## 2021-08-06 DIAGNOSIS — I712 Thoracic aortic aneurysm, without rupture, unspecified: Secondary | ICD-10-CM

## 2021-08-06 NOTE — Progress Notes (Signed)
HPI  The patient is a 80 year old woman who returns today for follow-up of a 4.5 cm fusiform ascending aortic aneurysm.  She denies any chest or back pain.  She does report some exertional fatigue.  She has chronic edema of both lower legs.  Overall, she is doing pretty well since her last appointment a year ago.  She does have chronic atrial fibrillation and she is rate controlled on Coumadin.  She is followed by the atrial fibrillation clinic.  Her biggest complaint is frequent UTIs and she has been talking to her primary care provider about starting a low-dose antibiotic for her to take daily.  She has been seeing Dr. Cyndia Bent yearly for aneurysmal dilatation surveillance.   Current Outpatient Medications on File Prior to Visit  Medication Sig Dispense Refill   acetaminophen (TYLENOL) 500 MG tablet Take 500 mg by mouth at bedtime.     allopurinol (ZYLOPRIM) 300 MG tablet Take 150 mg by mouth every evening.      Cholecalciferol (VITAMIN D3) 25 MCG (1000 UT) CAPS Take 3 capsules by mouth daily in the afternoon.     clobetasol cream (TEMOVATE) 5.80 % Apply 1 application topically as needed.     dofetilide (TIKOSYN) 250 MCG capsule TAKE 1 CAPSULE BY MOUTH TWICE DAILY 180 capsule 3   furosemide (LASIX) 40 MG tablet Take by mouth.     HYDROcodone-acetaminophen (NORCO/VICODIN) 5-325 MG tablet Take 1 tablet by mouth as needed.     Hyprom-Naphaz-Polysorb-Zn Sulf (CLEAR EYES COMPLETE) SOLN 2 drops in both eyes     losartan (COZAAR) 25 MG tablet Take 25 mg by mouth daily.     magnesium oxide (MAG-OX) 400 MG tablet Take 1 tablet in the AM and 1/2 tablet in the PM     metFORMIN (GLUCOPHAGE) 500 MG tablet Take 500 mg by mouth 2 (two) times daily with a meal.      metoprolol tartrate (LOPRESSOR) 25 MG tablet Take 25-50 mg by mouth See admin instructions. Take 2 tablets in the morning, and 2 tablets in the evening.     Multiple Vitamins-Minerals (PRESERVISION AREDS 2) CAPS Take 2 capsules by mouth  daily.     ONETOUCH ULTRA test strip      potassium chloride SA (K-DUR,KLOR-CON) 20 MEQ tablet Take 1.5 tablets (30 mEq total) by mouth daily. 30 tablet 6   SSD 1 % cream Apply topically 2 (two) times daily.     vitamin B-12 (CYANOCOBALAMIN) 500 MCG tablet Take 500 mcg by mouth daily.     warfarin (COUMADIN) 5 MG tablet Take 5 mg by mouth daily.      No current facility-administered medications on file prior to visit.     Physical Exam Vitals:   08/06/21 1405  BP: 137/65  Pulse: 93  SpO2: 90%    She is a morbidly obese woman with a BMI of 46 who is in no distress. Cardiac exam shows irregularly irregular Lungs are clear to auscultation bilaterally and in all fields There is marked bilateral lower extremity edema which is chronic.  Scaling of the skin with erythema.  Diagnostic Tests:  CLINICAL DATA:  PE suspected, high prob   Shortness of breath.   EXAM: CT ANGIOGRAPHY CHEST WITH CONTRAST   TECHNIQUE: Multidetector CT imaging of the chest was performed using the standard protocol during bolus administration of intravenous contrast. Multiplanar CT image reconstructions and MIPs were obtained to evaluate the vascular anatomy.   CONTRAST:  115m OMNIPAQUE IOHEXOL 350 MG/ML SOLN  COMPARISON:  Radiograph earlier this day. Most recent chest CTA 08/09/2020   FINDINGS: Cardiovascular: There are no filling defects within the pulmonary arteries to suggest pulmonary embolus. Stable size and configuration of ascending aortic aneurysm maximal dimension 4.4 cm. Mild aortic atherosclerosis. Conventional branching pattern from the aortic arch. Upper normal heart size. Mitral annulus calcifications. Coronary artery calcifications. No pericardial effusion.   Mediastinum/Nodes: No enlarged mediastinal or hilar lymph nodes. No esophageal wall thickening. No thyroid nodule.   Lungs/Pleura: Mild atelectasis in both lower lobes and lingula. No evidence of pneumonia. Left upper lobe  calcified granuloma again seen. Stable small subpleural opacity in the posterior right upper lobe, series 6, image 30. Trachea and central bronchi are patent. No pleural fluid. No findings of pulmonary edema. Mild central bronchial thickening.   Upper Abdomen: Assessed on concurrent abdominal CT, reported separately.   Musculoskeletal: Diffuse thoracic spondylosis with endplate spurring. There are no acute or suspicious osseous abnormalities.   Review of the MIP images confirms the above findings.   IMPRESSION: 1. No pulmonary embolus. 2. Stable size and configuration of ascending aortic aneurysm, maximal dimension 4.4 cm. 3. Mild central bronchial thickening. Mild atelectasis in both lower lobes and lingula. 4. Coronary artery calcifications.   Aortic Atherosclerosis (ICD10-I70.0).     Electronically Signed   By: Keith Rake M.D.   On: 03/21/2021 20:43  CLINICAL DATA:  THORACIC AORTIC ANEURYSM FOLLOW-UP.   EXAM: CT ANGIOGRAPHY CHEST WITH CONTRAST   TECHNIQUE: Multidetector CT imaging of the chest was performed using the standard protocol during bolus administration of intravenous contrast. Multiplanar CT image reconstructions and MIPs were obtained to evaluate the vascular anatomy.   CONTRAST:  57m ISOVUE-370 IOPAMIDOL (ISOVUE-370) INJECTION 76%   COMPARISON:  CTA 08/07/2019   FINDINGS: Cardiovascular: Stable size of the ascending aortic aneurysm, which measures 4.4 x 4.4 cm on series 4, image 57. The measured diameter at the sinuses of Valsalva measures 3.5 cm, unchanged. Measured diameter in the descending thoracic aorta at the main pulmonary outflow tract measures 2.5 by 2.4 cm, unchanged. No evidence of dissection. No pericardial effusion. Great vessels are patent. No evidence of central pulmonary embolus.   Mediastinum/Nodes: No enlarged mediastinal, hilar, or axillary lymph nodes. Thyroid gland, trachea, and esophagus demonstrate no significant  findings.   Lungs/Pleura: Similar calcified granuloma in the left upper lobe. Bibasilar atelectasis/scar. Similar 4 mm nodular opacity abutting the pleura in the posterior right upper lobe (series 6, image 27). No consolidation.   Upper Abdomen: No acute abnormality. Similar 1.3 cm right adrenal nodule, probably an adenoma.   Musculoskeletal: Multilevel degenerative change of the thoracic spine with flowing anterior osteophytes, suggestive of diffuse idiopathic skeletal hyperostosis.   Review of the MIP images confirms the above findings.   IMPRESSION: 1. Stable aneurysm of the ascending aorta, measuring up to 4.4 cm. Recommend semi-annual imaging followup by CTA or MRA and referral to cardiothoracic surgery if not already obtained. This recommendation follows 2010 ACCF/AHA/AATS/ACR/ASA/SCA/SCAI/SIR/STS/SVM Guidelines for the Diagnosis and Management of Patients With Thoracic Aortic Disease. Circulation. 2010; 121:: [id number] Aortic aneurysm NOS (ICD10-I71.9) 2. No acute intrathoracic findings. 3. 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     Electronically Signed   By: FMargaretha SheffieldMD   On: 08/09/2020 10:45   Impression:  This 80year old patient has a stable 4.4 cm fusiform ascending aortic aneurysm.  This is still well below the surgical threshold of 5.5 cm.  She had a 2D echocardiogram in 2018 showing a trileaflet aortic valve with mildly  thickened leaflets and no stenosis or regurgitation.  I reviewed the CTA images from June of this year with her and answered her questions.  I stressed the importance of continued good blood pressure control and preventing further enlargement and acute aortic dissection.    We discussed diet recommendations involving a low-sodium diet.  She is also recommended to maintain her low-carb diabetic diet.  She has not been doing any routine exercise but plans to increase her walking.   Plan:  I will plan to see her back in 1 year with a CTA  of the chest.  I spent 30 minutes performing this established patient evaluation and > 50% of this time was spent face to face counseling and coordinating the care of this patient's aortic aneurysm.   Nicholes Rough, PA-C [facility] [phone]

## 2021-08-21 DIAGNOSIS — M65341 Trigger finger, right ring finger: Secondary | ICD-10-CM | POA: Diagnosis not present

## 2021-08-26 ENCOUNTER — Other Ambulatory Visit: Payer: Self-pay

## 2021-08-26 ENCOUNTER — Encounter (HOSPITAL_COMMUNITY): Payer: Self-pay

## 2021-08-26 ENCOUNTER — Inpatient Hospital Stay (HOSPITAL_COMMUNITY)
Admission: EM | Admit: 2021-08-26 | Discharge: 2021-08-30 | DRG: 871 | Disposition: A | Discharge status: Skilled Nursing Facility | Payer: Medicare Other | Attending: Internal Medicine | Admitting: Internal Medicine

## 2021-08-26 ENCOUNTER — Emergency Department (HOSPITAL_COMMUNITY): Payer: Medicare Other

## 2021-08-26 DIAGNOSIS — M109 Gout, unspecified: Secondary | ICD-10-CM | POA: Diagnosis not present

## 2021-08-26 DIAGNOSIS — E785 Hyperlipidemia, unspecified: Secondary | ICD-10-CM | POA: Diagnosis present

## 2021-08-26 DIAGNOSIS — E1142 Type 2 diabetes mellitus with diabetic polyneuropathy: Secondary | ICD-10-CM | POA: Diagnosis present

## 2021-08-26 DIAGNOSIS — R2242 Localized swelling, mass and lump, left lower limb: Secondary | ICD-10-CM | POA: Diagnosis not present

## 2021-08-26 DIAGNOSIS — M25562 Pain in left knee: Secondary | ICD-10-CM | POA: Diagnosis not present

## 2021-08-26 DIAGNOSIS — R6520 Severe sepsis without septic shock: Secondary | ICD-10-CM

## 2021-08-26 DIAGNOSIS — I5032 Chronic diastolic (congestive) heart failure: Secondary | ICD-10-CM | POA: Diagnosis not present

## 2021-08-26 DIAGNOSIS — Z7901 Long term (current) use of anticoagulants: Secondary | ICD-10-CM

## 2021-08-26 DIAGNOSIS — Z86711 Personal history of pulmonary embolism: Secondary | ICD-10-CM | POA: Diagnosis not present

## 2021-08-26 DIAGNOSIS — D696 Thrombocytopenia, unspecified: Secondary | ICD-10-CM | POA: Diagnosis not present

## 2021-08-26 DIAGNOSIS — I480 Paroxysmal atrial fibrillation: Secondary | ICD-10-CM | POA: Diagnosis not present

## 2021-08-26 DIAGNOSIS — E1165 Type 2 diabetes mellitus with hyperglycemia: Secondary | ICD-10-CM | POA: Diagnosis not present

## 2021-08-26 DIAGNOSIS — G928 Other toxic encephalopathy: Secondary | ICD-10-CM | POA: Diagnosis not present

## 2021-08-26 DIAGNOSIS — Z86718 Personal history of other venous thrombosis and embolism: Secondary | ICD-10-CM

## 2021-08-26 DIAGNOSIS — E1169 Type 2 diabetes mellitus with other specified complication: Secondary | ICD-10-CM | POA: Diagnosis not present

## 2021-08-26 DIAGNOSIS — I110 Hypertensive heart disease with heart failure: Secondary | ICD-10-CM | POA: Diagnosis present

## 2021-08-26 DIAGNOSIS — K219 Gastro-esophageal reflux disease without esophagitis: Secondary | ICD-10-CM | POA: Diagnosis present

## 2021-08-26 DIAGNOSIS — R0902 Hypoxemia: Secondary | ICD-10-CM | POA: Diagnosis not present

## 2021-08-26 DIAGNOSIS — M1990 Unspecified osteoarthritis, unspecified site: Secondary | ICD-10-CM | POA: Diagnosis not present

## 2021-08-26 DIAGNOSIS — Z8249 Family history of ischemic heart disease and other diseases of the circulatory system: Secondary | ICD-10-CM

## 2021-08-26 DIAGNOSIS — G4733 Obstructive sleep apnea (adult) (pediatric): Secondary | ICD-10-CM | POA: Diagnosis present

## 2021-08-26 DIAGNOSIS — E872 Acidosis, unspecified: Secondary | ICD-10-CM | POA: Diagnosis present

## 2021-08-26 DIAGNOSIS — Z87440 Personal history of urinary (tract) infections: Secondary | ICD-10-CM

## 2021-08-26 DIAGNOSIS — R531 Weakness: Secondary | ICD-10-CM

## 2021-08-26 DIAGNOSIS — I4819 Other persistent atrial fibrillation: Secondary | ICD-10-CM | POA: Diagnosis not present

## 2021-08-26 DIAGNOSIS — Z741 Need for assistance with personal care: Secondary | ICD-10-CM | POA: Diagnosis not present

## 2021-08-26 DIAGNOSIS — R5383 Other fatigue: Secondary | ICD-10-CM | POA: Diagnosis not present

## 2021-08-26 DIAGNOSIS — Z8701 Personal history of pneumonia (recurrent): Secondary | ICD-10-CM

## 2021-08-26 DIAGNOSIS — D638 Anemia in other chronic diseases classified elsewhere: Secondary | ICD-10-CM | POA: Diagnosis not present

## 2021-08-26 DIAGNOSIS — Z888 Allergy status to other drugs, medicaments and biological substances status: Secondary | ICD-10-CM

## 2021-08-26 DIAGNOSIS — L03116 Cellulitis of left lower limb: Secondary | ICD-10-CM | POA: Diagnosis present

## 2021-08-26 DIAGNOSIS — R600 Localized edema: Secondary | ICD-10-CM | POA: Diagnosis not present

## 2021-08-26 DIAGNOSIS — E669 Obesity, unspecified: Secondary | ICD-10-CM | POA: Diagnosis present

## 2021-08-26 DIAGNOSIS — R262 Difficulty in walking, not elsewhere classified: Secondary | ICD-10-CM | POA: Diagnosis not present

## 2021-08-26 DIAGNOSIS — Z20822 Contact with and (suspected) exposure to covid-19: Secondary | ICD-10-CM | POA: Diagnosis not present

## 2021-08-26 DIAGNOSIS — Z7984 Long term (current) use of oral hypoglycemic drugs: Secondary | ICD-10-CM

## 2021-08-26 DIAGNOSIS — Z90710 Acquired absence of both cervix and uterus: Secondary | ICD-10-CM

## 2021-08-26 DIAGNOSIS — Z881 Allergy status to other antibiotic agents status: Secondary | ICD-10-CM

## 2021-08-26 DIAGNOSIS — M6281 Muscle weakness (generalized): Secondary | ICD-10-CM | POA: Diagnosis not present

## 2021-08-26 DIAGNOSIS — R52 Pain, unspecified: Secondary | ICD-10-CM | POA: Diagnosis present

## 2021-08-26 DIAGNOSIS — Z9181 History of falling: Secondary | ICD-10-CM | POA: Diagnosis not present

## 2021-08-26 DIAGNOSIS — N3001 Acute cystitis with hematuria: Secondary | ICD-10-CM

## 2021-08-26 DIAGNOSIS — M1A00X0 Idiopathic chronic gout, unspecified site, without tophus (tophi): Secondary | ICD-10-CM | POA: Diagnosis not present

## 2021-08-26 DIAGNOSIS — Z91013 Allergy to seafood: Secondary | ICD-10-CM

## 2021-08-26 DIAGNOSIS — E739 Lactose intolerance, unspecified: Secondary | ICD-10-CM | POA: Diagnosis not present

## 2021-08-26 DIAGNOSIS — I10 Essential (primary) hypertension: Secondary | ICD-10-CM | POA: Diagnosis present

## 2021-08-26 DIAGNOSIS — D6869 Other thrombophilia: Secondary | ICD-10-CM | POA: Diagnosis not present

## 2021-08-26 DIAGNOSIS — L03115 Cellulitis of right lower limb: Secondary | ICD-10-CM | POA: Diagnosis not present

## 2021-08-26 DIAGNOSIS — M25572 Pain in left ankle and joints of left foot: Secondary | ICD-10-CM | POA: Diagnosis not present

## 2021-08-26 DIAGNOSIS — Z882 Allergy status to sulfonamides status: Secondary | ICD-10-CM

## 2021-08-26 DIAGNOSIS — E118 Type 2 diabetes mellitus with unspecified complications: Secondary | ICD-10-CM

## 2021-08-26 DIAGNOSIS — H3530 Unspecified macular degeneration: Secondary | ICD-10-CM | POA: Diagnosis not present

## 2021-08-26 DIAGNOSIS — Z96642 Presence of left artificial hip joint: Secondary | ICD-10-CM | POA: Diagnosis not present

## 2021-08-26 DIAGNOSIS — M25462 Effusion, left knee: Secondary | ICD-10-CM | POA: Diagnosis not present

## 2021-08-26 DIAGNOSIS — R6889 Other general symptoms and signs: Secondary | ICD-10-CM | POA: Diagnosis not present

## 2021-08-26 DIAGNOSIS — I7121 Aneurysm of the ascending aorta, without rupture: Secondary | ICD-10-CM | POA: Diagnosis not present

## 2021-08-26 DIAGNOSIS — M79605 Pain in left leg: Secondary | ICD-10-CM | POA: Diagnosis not present

## 2021-08-26 DIAGNOSIS — L0390 Cellulitis, unspecified: Secondary | ICD-10-CM | POA: Diagnosis present

## 2021-08-26 DIAGNOSIS — K5190 Ulcerative colitis, unspecified, without complications: Secondary | ICD-10-CM | POA: Diagnosis not present

## 2021-08-26 DIAGNOSIS — R296 Repeated falls: Secondary | ICD-10-CM | POA: Diagnosis present

## 2021-08-26 DIAGNOSIS — I6782 Cerebral ischemia: Secondary | ICD-10-CM | POA: Diagnosis not present

## 2021-08-26 DIAGNOSIS — M79604 Pain in right leg: Secondary | ICD-10-CM | POA: Diagnosis not present

## 2021-08-26 DIAGNOSIS — I878 Other specified disorders of veins: Secondary | ICD-10-CM | POA: Diagnosis present

## 2021-08-26 DIAGNOSIS — M8580 Other specified disorders of bone density and structure, unspecified site: Secondary | ICD-10-CM | POA: Diagnosis not present

## 2021-08-26 DIAGNOSIS — A419 Sepsis, unspecified organism: Principal | ICD-10-CM

## 2021-08-26 DIAGNOSIS — N390 Urinary tract infection, site not specified: Secondary | ICD-10-CM | POA: Diagnosis not present

## 2021-08-26 DIAGNOSIS — R2681 Unsteadiness on feet: Secondary | ICD-10-CM | POA: Diagnosis not present

## 2021-08-26 DIAGNOSIS — M79672 Pain in left foot: Secondary | ICD-10-CM | POA: Diagnosis not present

## 2021-08-26 DIAGNOSIS — M5136 Other intervertebral disc degeneration, lumbar region: Secondary | ICD-10-CM | POA: Diagnosis not present

## 2021-08-26 DIAGNOSIS — Z79899 Other long term (current) drug therapy: Secondary | ICD-10-CM

## 2021-08-26 DIAGNOSIS — E1159 Type 2 diabetes mellitus with other circulatory complications: Secondary | ICD-10-CM | POA: Diagnosis not present

## 2021-08-26 DIAGNOSIS — Z885 Allergy status to narcotic agent status: Secondary | ICD-10-CM

## 2021-08-26 DIAGNOSIS — Z743 Need for continuous supervision: Secondary | ICD-10-CM | POA: Diagnosis not present

## 2021-08-26 DIAGNOSIS — I152 Hypertension secondary to endocrine disorders: Secondary | ICD-10-CM | POA: Diagnosis not present

## 2021-08-26 DIAGNOSIS — R5381 Other malaise: Secondary | ICD-10-CM | POA: Diagnosis not present

## 2021-08-26 DIAGNOSIS — Z6841 Body Mass Index (BMI) 40.0 and over, adult: Secondary | ICD-10-CM | POA: Diagnosis not present

## 2021-08-26 DIAGNOSIS — Z887 Allergy status to serum and vaccine status: Secondary | ICD-10-CM

## 2021-08-26 DIAGNOSIS — I700 Atherosclerosis of aorta: Secondary | ICD-10-CM | POA: Diagnosis not present

## 2021-08-26 HISTORY — DX: Aneurysm of the ascending aorta, without rupture: I71.21

## 2021-08-26 HISTORY — DX: Personal history of pneumonia (recurrent): Z87.01

## 2021-08-26 HISTORY — DX: Paroxysmal atrial fibrillation: I48.0

## 2021-08-26 LAB — URINALYSIS, ROUTINE W REFLEX MICROSCOPIC
Bacteria, UA: NONE SEEN
Bilirubin Urine: NEGATIVE
Glucose, UA: NEGATIVE mg/dL
Ketones, ur: 5 mg/dL — AB
Nitrite: POSITIVE — AB
Protein, ur: 100 mg/dL — AB
Specific Gravity, Urine: 1.019 (ref 1.005–1.030)
WBC, UA: >50 WBC/hpf — ABNORMAL HIGH (ref 0–5)
pH: 8 (ref 5.0–8.0)

## 2021-08-26 LAB — COMPREHENSIVE METABOLIC PANEL
ALT: 20 U/L (ref 0–44)
AST: 28 U/L (ref 15–41)
Albumin: 4 g/dL (ref 3.5–5.0)
Alkaline Phosphatase: 57 U/L (ref 38–126)
Anion gap: 12 (ref 5–15)
BUN: 23 mg/dL (ref 8–23)
CO2: 24 mmol/L (ref 22–32)
Calcium: 9.2 mg/dL (ref 8.9–10.3)
Chloride: 100 mmol/L (ref 98–111)
Creatinine, Ser: 0.76 mg/dL (ref 0.44–1.00)
GFR, Estimated: >60 mL/min (ref >60)
Glucose, Bld: 159 mg/dL — ABNORMAL HIGH (ref 70–99)
Potassium: 4.3 mmol/L (ref 3.5–5.1)
Sodium: 136 mmol/L (ref 135–145)
Total Bilirubin: 0.8 mg/dL (ref 0.3–1.2)
Total Protein: 7.7 g/dL (ref 6.5–8.1)

## 2021-08-26 LAB — PROTIME-INR
INR: 1.8 — ABNORMAL HIGH (ref 0.8–1.2)
Prothrombin Time: 20.9 seconds — ABNORMAL HIGH (ref 11.4–15.2)

## 2021-08-26 LAB — CBC WITH DIFFERENTIAL/PLATELET
Abs Immature Granulocytes: 0.16 10*3/uL — ABNORMAL HIGH (ref 0.00–0.07)
Basophils Absolute: 0 10*3/uL (ref 0.0–0.1)
Basophils Relative: 0 %
Eosinophils Absolute: 0 10*3/uL (ref 0.0–0.5)
Eosinophils Relative: 0 %
HCT: 42.6 % (ref 36.0–46.0)
Hemoglobin: 13.4 g/dL (ref 12.0–15.0)
Immature Granulocytes: 1 %
Lymphocytes Relative: 6 %
Lymphs Abs: 0.9 10*3/uL (ref 0.7–4.0)
MCH: 28.8 pg (ref 26.0–34.0)
MCHC: 31.5 g/dL (ref 30.0–36.0)
MCV: 91.4 fL (ref 80.0–100.0)
Monocytes Absolute: 0.6 10*3/uL (ref 0.1–1.0)
Monocytes Relative: 4 %
Neutro Abs: 12.6 10*3/uL — ABNORMAL HIGH (ref 1.7–7.7)
Neutrophils Relative %: 89 %
Platelets: 126 10*3/uL — ABNORMAL LOW (ref 150–400)
RBC: 4.66 MIL/uL (ref 3.87–5.11)
RDW: 17.3 % — ABNORMAL HIGH (ref 11.5–15.5)
WBC: 14.3 10*3/uL — ABNORMAL HIGH (ref 4.0–10.5)
nRBC: 0 % (ref 0.0–0.2)

## 2021-08-26 LAB — RESP PANEL BY RT-PCR (FLU A&B, COVID) ARPGX2
Influenza A by PCR: NEGATIVE
Influenza B by PCR: NEGATIVE
SARS Coronavirus 2 by RT PCR: NEGATIVE

## 2021-08-26 LAB — LACTIC ACID, PLASMA
Lactic Acid, Venous: 3.2 mmol/L (ref 0.5–1.9)
Lactic Acid, Venous: 3.2 mmol/L (ref 0.5–1.9)

## 2021-08-26 LAB — APTT: aPTT: 34 seconds (ref 24–36)

## 2021-08-26 MED ORDER — SODIUM CHLORIDE 0.9 % IV BOLUS
1000.0000 mL | Freq: Once | INTRAVENOUS | Status: AC
  Administered 2021-08-26: 1000 mL via INTRAVENOUS

## 2021-08-26 MED ORDER — LACTATED RINGERS IV SOLN: INTRAVENOUS | Status: DC

## 2021-08-26 MED ORDER — SODIUM CHLORIDE 0.9 % IV SOLN
2.0000 g | Freq: Once | INTRAVENOUS | Status: AC
  Administered 2021-08-26: 1 g via INTRAVENOUS
  Filled 2021-08-26: qty 20

## 2021-08-26 MED ORDER — ACETAMINOPHEN 325 MG PO TABS
650.0000 mg | ORAL_TABLET | Freq: Once | ORAL | Status: AC
  Administered 2021-08-26: 650 mg via ORAL
  Filled 2021-08-26: qty 2

## 2021-08-26 MED ORDER — SODIUM CHLORIDE 0.9 % IV SOLN
1.0000 g | Freq: Once | INTRAVENOUS | Status: DC
  Filled 2021-08-26: qty 10

## 2021-08-26 NOTE — ED Triage Notes (Signed)
Pt arrived via EMS with complaints of generalized weakness and fatigue. Pt states this began today. States she fell at home while trying to open the door because her left leg gave out. Denies hitting head or LOC. Pt takes coumadin.

## 2021-08-26 NOTE — H&P (Signed)
TRH H&P    Patient Demographics:    Natasha Glass, is a 80 y.o. female  MRN: [id number]  DOB - 11/30/1940  Admit Date - 08/26/2021  Referring MD/NP/PA: Ileene Patrick  Outpatient Primary MD for the patient is Josetta Huddle, MD  Patient coming from: Home  Chief complaint- generalized weakness   HPI:    Natasha Glass  is a 80 y.o. female, with history of atrial fibrillation, DVT, PE, hyperlipidemia, hypertension, lactose intolerance, macular degeneration, peripheral neuropathy, sleep apnea, type 2 diabetes mellitus, and more presents the ED with a chief complaint of generalized weakness.  Patient reports that she suddenly noticed weakness today.  She tried to get up, and her left leg was asleep and she fell onto the floor in her left side.  She reports that there was some pain at that time but it went away spontaneously.  She was not able to get up.  At baseline she is not able to get up if she is not completely on the floor.  When she is found herself on the floor previously she would scoot over to the front steps, where she could put her legs down underneath her and then stand up.  She reports she did not even try that today because she was too weak.  At baseline she ambulates with a walker, and she did have a walker close to her, but she could not reach it.  She reports she normally stands up and then grabs a walker.  Patient denies hitting her head or losing consciousness.  Patient had no preceding factors to her fall, including no chest pain, palpitations, dizziness, dyspnea.  Patient reports that she just felt like her leg was weak and slid down to the floor.  She does feel like her knee catches now.  She reports she is unable to bend it past 45 degrees at this time.  Not because of pain, because the knee will not bend.  We will get an x-ray of the left knee.  Patient thinks it her symptoms came on suddenly, but per her own  story she was not behaving as her normal [place] this morning.  She normally gets up and eats breakfast early and then goes to church.  She did not get up, did not feel hungry, slept through church.  This is abnormal for her.  Son at bedside says he saw her yesterday and she was ambulating with just her cane, and doing fine.  She has complained of dysuria for the last 2 or 3 weeks.  Patient does have a history of chronic UTIs, and just finished a course of antibiotics 2 weeks ago.  She has not noticed any fevers at home.  She has not noticed if her right leg has been gradually becoming more erythematous or not.  She reports that her legs are always erythematous, but is visibly more erythematous than the left and she is not aware of that.  Patient has had urine culture here before.  It grew Klebsiella pneumonia.  This was back in 2017.  At  that time it was sensitive to all except for ampicillin.  Patient does not smoke, does not drink, does not use illicit drugs.  She is vaccinated for COVID.  Patient is full code.  Son does report the patient was slightly confused after her fall.  Even now she is having trouble remembering what day it is, and that is abnormal for her.  She is otherwise oriented.  In the ED Temp 101.9, heart rate 85-104, respiratory rate 17-31, blood pressure 104/58, satting 92% UA is indicative of UTI Physical exam is indicative of cellulitis of the right lower extremity Negative respiratory panel Urine culture and blood culture pending Chest x-ray shows no active disease Lactic acid is 3.2x2 White blood cell count 14.3 Chemistry panel unremarkable EKG shows A. fib with a rate of 97 Rocephin 2 g given in the ED 2 L bolus given in the ED LR 150 mils per hour Admission requested for further management of sepsis which is likely secondary to cellulitis, but UTI is also on the differential   Review of systems:    In addition to the HPI above,  No Fever-chills, No Headache, No  changes with Vision or hearing, No problems swallowing food or Liquids, No Chest pain, Cough or Shortness of Breath, No Abdominal pain, No Nausea or Vomiting, bowel movements are regular, No Blood in stool or Urine, No dysuria, No new skin rashes or bruises, No new joints pains-aches,  No new asymmetric weakness, tingling, numbness in any extremity, No recent weight gain or loss, No polyuria, polydypsia or polyphagia, No significant Mental Stressors.  All other systems reviewed and are negative.    Past History of the following :    Past Medical History:  Diagnosis Date   Anemia    Arthritis    "legs, hands, knees, back" (04/10/2016)   Atrial fibrillation ([place]) 08/2018   DVT (deep venous thrombosis) ([place]) 01/2009   Dyspnea    due to weight    GERD (gastroesophageal reflux disease)    occ.   Gout    Headache    "at least weekly" (04/10/2016)   History of blood transfusion 1990s   "related to ulcerative colitis"   History of colonic polyps 09/2010   Hyperlipidemia    Hypertension    Lactose intolerance    Macular degeneration, dry    bilateral   Migraine    "q 2-3 months" (04/10/2016)   Obesity    Osteopenia    Peripheral neuropathy    Pneumonia 1970s   PONV (postoperative nausea and vomiting)    hx of years ago none recent    Pulmonary embolism ([place]) 01/2009   Shortness of breath on exertion    Sleep apnea    cpap pressure 4.0-16.0    Type II diabetes mellitus ([place])    type II    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)    Urinary incontinence    wears peripad      Past Surgical History:  Procedure Laterality Date   [place] N/A 05/07/2018   Procedure: CARDIOVERSION;  Surgeon: Josue Hector, MD;  Location: [facility] ENDOSCOPY;  Service: Cardiovascular;  Laterality: N/A;   COLONOSCOPY W/ BIOPSIES AND POLYPECTOMY  2005; 2011; 2012   COLONOSCOPY WITH PROPOFOL  N/A 09/10/2016   Procedure: COLONOSCOPY WITH PROPOFOL;  Surgeon: Garlan Fair, MD;  Location: [facility] ENDOSCOPY;  Service: Endoscopy;  Laterality: N/A;   JOINT  REPLACEMENT     KNEE ARTHROSCOPY Left ~ [place] Left 2011   TOTAL KNEE ARTHROPLASTY Bilateral 2005-2012   left-right      Social History:      Social History   Tobacco Use   Smoking status: Never   Smokeless tobacco: Never  Substance Use Topics   Alcohol use: No       Family History :     Family History  Problem Relation Age of Onset   Colon cancer Mother    Lung cancer Mother    Heart disease Father       Home Medications:   Prior to Admission medications   Medication Sig Start Date End Date Taking? Authorizing Provider  acetaminophen (TYLENOL) 500 MG tablet Take 500 mg by mouth at bedtime.   Yes [provider]  allopurinol (ZYLOPRIM) 300 MG tablet Take 150 mg by mouth every evening.  04/19/14  Yes [provider]  Cholecalciferol (VITAMIN D3) 25 MCG (1000 UT) CAPS Take 3 capsules by mouth daily in the afternoon.   Yes [provider]  clobetasol cream (TEMOVATE) 1.61 % Apply 1 application topically as needed.   Yes [provider]  dofetilide (TIKOSYN) 250 MCG capsule TAKE 1 CAPSULE BY MOUTH TWICE DAILY 01/23/21  Yes Sherran Needs, NP  furosemide (LASIX) 40 MG tablet Take 60 mg by mouth daily. 04/05/21  Yes [provider]  HYDROcodone-acetaminophen (NORCO/VICODIN) 5-325 MG tablet Take 1 tablet by mouth as needed. 08/28/19  Yes [provider]  Hyprom-Naphaz-Polysorb-Zn Sulf (CLEAR EYES COMPLETE) SOLN Place 2 drops into both eyes 2 (two) times daily.   Yes [provider]  losartan (COZAAR) 25 MG tablet Take 25 mg by mouth daily.   Yes [provider]  magnesium oxide (MAG-OX) 400 MG tablet Take 1 tablet in the AM and 1/2 tablet in the PM 04/19/21  Yes Sherran Needs, NP  metFORMIN (GLUCOPHAGE) 500 MG  tablet Take 500 mg by mouth 2 (two) times daily with a meal.  04/19/14  Yes [provider]  metoprolol tartrate (LOPRESSOR) 25 MG tablet Take 25-50 mg by mouth See admin instructions. Take 2 tablets in the morning, and 2 tablets in the evening.   Yes [provider]  Multiple Vitamins-Minerals (PRESERVISION AREDS 2) CAPS Take 2 capsules by mouth daily.   Yes [provider]  potassium chloride SA (K-DUR,KLOR-CON) 20 MEQ tablet Take 1.5 tablets (30 mEq total) by mouth daily. 08/22/18  Yes Baldwin Jamaica, PA-C  warfarin (COUMADIN) 5 MG tablet Take 5 mg by mouth daily. 5 mg daily except for Monday take 1/2 tablet   Yes [provider]  [facility] ULTRA test strip  08/16/19   [provider]  SSD 1 % cream Apply topically 2 (two) times daily. Patient not taking: No sig reported 08/15/20   [provider]  vitamin B-12 (CYANOCOBALAMIN) 500 MCG tablet Take 500 mcg by mouth daily.    [provider]     Allergies:     Allergies  Allergen Reactions   Bactrim [Sulfamethoxazole-Trimethoprim]     Drop in BP   Morphine And Related Other (See Comments)    Unresponsive-Very bad reaction.  Can take hydrocodone.    Oysters [Shellfish Allergy] Shortness Of Breath, Itching, Swelling and Rash   Amlodipine Swelling and Other (See Comments)    Of legs   Percocet [Oxycodone-Acetaminophen] Other (See Comments)    hallucinations   Tdap [Tetanus-Diphth-Acell  Pertussis] Itching, Swelling and Rash   Tetanus Toxoids Itching, Swelling and Rash   Tramadol Swelling and Other (See Comments)    Of legs   Carbamazepine Other (See Comments)   Lactose Intolerance (Gi)    Macrodantin [Nitrofurantoin] Other (See Comments)   Tetanus-Diphtheria Toxoids Td Other (See Comments)   Topiramate Other (See Comments)   Diovan [Valsartan] Other (See Comments)    Cough   Diphtheria Toxoid Itching, Swelling and Rash   Macrodantin [Nitrofurantoin Macrocrystal] Other (See  Comments)    unspecified     Physical Exam:   Vitals  Blood pressure (!) 104/58, pulse 85, temperature 98.4 F (36.9 C), temperature source Oral, resp. rate 18, height 5' 3"  (1.6 m), weight 128.4 kg, SpO2 92 %.   1.  General: Patient lying supine in bed,  no acute distress   2. Psychiatric: Alert and oriented x 3, mood and behavior normal for situation, pleasant and cooperative with exam   3. Neurologic: Speech and language are normal, face is symmetric, moves all 4 extremities voluntarily, at baseline without acute deficits on limited exam   4. HEENMT:  Head is atraumatic, normocephalic, pupils reactive to light, neck is supple, trachea is midline, mucous membranes are moist   5. Respiratory : Lungs are clear to auscultation bilaterally without wheezing, rhonchi, rales, no cyanosis, no increase in work of breathing or accessory muscle use   6. Cardiovascular : Heart rate normal, rhythm is regular, no murmurs, rubs or gallops, no peripheral edema, peripheral pulses palpated   7. Gastrointestinal:  Abdomen is soft, nondistended, nontender to palpation bowel sounds active, no masses or organomegaly palpated   8. Skin:  Skin is warm, dry and intact without rashes, acute lesions, or ulcers on limited exam   9.Musculoskeletal:  Significant lymphedema both lower extremities, scaling and venous stasis changes of both lower extremities, right lower extremity is erythematous, hot to the touch.  Left lower extremity is tender at the knee.     Data Review:    CBC Recent Labs  Lab 08/26/21 1838  WBC 14.3*  HGB 13.4  HCT 42.6  PLT 126*  MCV 91.4  MCH 28.8  MCHC 31.5  RDW 17.3*  LYMPHSABS 0.9  MONOABS 0.6  EOSABS 0.0  BASOSABS 0.0   ------------------------------------------------------------------------------------------------------------------  Results for orders placed or performed during the hospital encounter of 08/26/21 (from the past 48 hour(s))  Lactic acid,  plasma     Status: Abnormal   Collection Time: 08/26/21  6:38 PM  Result Value Ref Range   Lactic Acid, Venous 3.2 (HH) 0.5 - 1.9 mmol/L    Comment: CRITICAL RESULT CALLED TO, READ BACK BY AND VERIFIED WITH: GALLOWAY,B 1917 08/26/2021 COLEMAN,R Performed at [facility], [address], [place], [place] [address]   Comprehensive metabolic panel     Status: Abnormal   Collection Time: 08/26/21  6:38 PM  Result Value Ref Range   Sodium 136 135 - 145 mmol/L   Potassium 4.3 3.5 - 5.1 mmol/L   Chloride 100 98 - 111 mmol/L   CO2 24 22 - 32 mmol/L   Glucose, Bld 159 (H) 70 - 99 mg/dL    Comment: Glucose reference range applies only to samples taken after fasting for at least 8 hours.   BUN 23 8 - 23 mg/dL   Creatinine, Ser 0.76 0.44 - 1.00 mg/dL   Calcium 9.2 8.9 - 10.3 mg/dL   Total Protein 7.7 6.5 - 8.1 g/dL   Albumin 4.0 3.5 - 5.0 g/dL  AST 28 15 - 41 U/L   ALT 20 0 - 44 U/L   Alkaline Phosphatase 57 38 - 126 U/L   Total Bilirubin 0.8 0.3 - 1.2 mg/dL   GFR, Estimated >60 >60 mL/min    Comment: (NOTE) Calculated using the CKD-EPI Creatinine Equation (2021)    Anion gap 12 5 - 15    Comment: Performed at [facility], [address] [place], [place] [address]  CBC WITH DIFFERENTIAL     Status: Abnormal   Collection Time: 08/26/21  6:38 PM  Result Value Ref Range   WBC 14.3 (H) 4.0 - 10.5 K/uL   RBC 4.66 3.87 - 5.11 MIL/uL   Hemoglobin 13.4 12.0 - 15.0 g/dL   HCT 42.6 36.0 - 46.0 %   MCV 91.4 80.0 - 100.0 fL   MCH 28.8 26.0 - 34.0 pg   MCHC 31.5 30.0 - 36.0 g/dL   RDW 17.3 (H) 11.5 - 15.5 %   Platelets 126 (L) 150 - 400 K/uL    Comment: REPEATED TO VERIFY   nRBC 0.0 0.0 - 0.2 %   Neutrophils Relative % 89 %   Neutro Abs 12.6 (H) 1.7 - 7.7 K/uL   Lymphocytes Relative 6 %   Lymphs Abs 0.9 0.7 - 4.0 K/uL   Monocytes Relative 4 %   Monocytes Absolute 0.6 0.1 - 1.0 K/uL   Eosinophils Relative 0 %   Eosinophils Absolute 0.0 0.0 - 0.5 K/uL   Basophils Relative 0 %   Basophils  Absolute 0.0 0.0 - 0.1 K/uL   Immature Granulocytes 1 %   Abs Immature Granulocytes 0.16 (H) 0.00 - 0.07 K/uL    Comment: Performed at [facility], [address] [place], [place] [address]  Protime-INR     Status: Abnormal   Collection Time: 08/26/21  6:38 PM  Result Value Ref Range   Prothrombin Time 20.9 (H) 11.4 - 15.2 seconds   INR 1.8 (H) 0.8 - 1.2    Comment: (NOTE) INR goal varies based on device and disease states. Performed at [facility], [address] [place], [place] [address]   APTT     Status: None   Collection Time: 08/26/21  6:38 PM  Result Value Ref Range   aPTT 34 24 - 36 seconds    Comment: Performed at [facility], [address] [place], [place] [address]  Blood Culture (routine x 2)     Status: None (Preliminary result)   Collection Time: 08/26/21  6:39 PM   Specimen: Right Antecubital; Blood  Result Value Ref Range   Specimen Description      RIGHT ANTECUBITAL BOTTLES DRAWN AEROBIC AND ANAEROBIC   Special Requests      Blood Culture results may not be optimal due to an inadequate volume of blood received in culture bottles Performed at [facility], [address], [place], [place] [address]    Culture PENDING    Report Status PENDING   Blood Culture (routine x 2)     Status: None (Preliminary result)   Collection Time: 08/26/21  6:39 PM   Specimen: Left Antecubital; Blood  Result Value Ref Range   Specimen Description      LEFT ANTECUBITAL BOTTLES DRAWN AEROBIC AND ANAEROBIC   Special Requests      Blood Culture results may not be optimal due to an inadequate volume of blood received in culture bottles Performed at [facility], [address] [place], [place] [address]    Culture PENDING  Report Status PENDING   Resp Panel by RT-PCR (Flu A&B, Covid) Nasopharyngeal Swab     Status: None   Collection Time: 08/26/21  6:50 PM   Specimen: Nasopharyngeal Swab; Nasopharyngeal(NP) swabs in vial transport medium  Result Value Ref Range   SARS  Coronavirus 2 by RT PCR NEGATIVE NEGATIVE    Comment: (NOTE) SARS-CoV-2 target nucleic acids are NOT DETECTED.  The SARS-CoV-2 RNA is generally detectable in upper respiratory specimens during the acute phase of infection. The lowest concentration of SARS-CoV-2 viral copies this assay can detect is 138 copies/mL. A negative result does not preclude SARS-Cov-2 infection and should not be used as the sole basis for treatment or other patient management decisions. A negative result may occur with  improper specimen collection/handling, submission of specimen other than nasopharyngeal swab, presence of viral mutation(s) within the areas targeted by this assay, and inadequate number of viral copies(<138 copies/mL). A negative result must be combined with clinical observations, patient history, and epidemiological information. The expected result is Negative.  Fact Sheet for Patients:  EntrepreneurPulse.com.au  Fact Sheet for Healthcare Providers:  IncredibleEmployment.be  This test is no t yet approved or cleared by the [place] FDA and  has been authorized for detection and/or diagnosis of SARS-CoV-2 by FDA under an Emergency Use Authorization (EUA). This EUA will remain  in effect (meaning this test can be used) for the duration of the COVID-19 declaration under Section 564(b)(1) of the Act, 21 U.S.C.section 360bbb-3(b)(1), unless the authorization is terminated  or revoked sooner.       Influenza A by PCR NEGATIVE NEGATIVE   Influenza B by PCR NEGATIVE NEGATIVE    Comment: (NOTE) The Xpert Xpress SARS-CoV-2/FLU/RSV plus assay is intended as an aid in the diagnosis of influenza from Nasopharyngeal swab specimens and should not be used as a sole basis for treatment. Nasal washings and aspirates are unacceptable for Xpert Xpress SARS-CoV-2/FLU/RSV testing.  Fact Sheet for Patients: EntrepreneurPulse.com.au  Fact Sheet  for Healthcare Providers: IncredibleEmployment.be  This test is not yet approved or cleared by the [place] FDA and has been authorized for detection and/or diagnosis of SARS-CoV-2 by FDA under an Emergency Use Authorization (EUA). This EUA will remain in effect (meaning this test can be used) for the duration of the COVID-19 declaration under Section 564(b)(1) of the Act, 21 U.S.C. section 360bbb-3(b)(1), unless the authorization is terminated or revoked.  Performed at [facility], [address], [place], [place] [address]   Lactic acid, plasma     Status: Abnormal   Collection Time: 08/26/21  8:51 PM  Result Value Ref Range   Lactic Acid, Venous 3.2 (HH) 0.5 - 1.9 mmol/L    Comment: CRITICAL RESULT CALLED TO, READ BACK BY AND VERIFIED WITH: GALLOWAY,B 2138 08/26/2021 COLEMAN,R Performed at [facility], [address], [place], [place] [address]   Urinalysis, Routine w reflex microscopic Urine, Catheterized     Status: Abnormal   Collection Time: 08/26/21  9:05 PM  Result Value Ref Range   Color, Urine AMBER (A) YELLOW    Comment: BIOCHEMICALS MAY BE AFFECTED BY COLOR   APPearance TURBID (A) CLEAR   Specific Gravity, Urine 1.019 1.005 - 1.030   pH 8.0 5.0 - 8.0   Glucose, UA NEGATIVE NEGATIVE mg/dL   Hgb urine dipstick SMALL (A) NEGATIVE   Bilirubin Urine NEGATIVE NEGATIVE   Ketones, ur 5 (A) NEGATIVE mg/dL   Protein, ur 100 (A) NEGATIVE mg/dL   Nitrite POSITIVE (A) NEGATIVE  Leukocytes,Ua LARGE (A) NEGATIVE   RBC / HPF 11-20 0 - 5 RBC/hpf   WBC, UA >50 (H) 0 - 5 WBC/hpf   Bacteria, UA NONE SEEN NONE SEEN   Squamous Epithelial / LPF 0-5 0 - 5   WBC Clumps PRESENT    Mucus PRESENT     Comment: Performed at [facility], [address], [place], [place] [address]    Chemistries  Recent Labs  Lab 08/26/21 1838  NA 136  K 4.3  CL 100  CO2 24  GLUCOSE 159*  BUN 23  CREATININE 0.76  CALCIUM 9.2  AST 28  ALT 20  ALKPHOS 57  BILITOT 0.8    ------------------------------------------------------------------------------------------------------------------  ------------------------------------------------------------------------------------------------------------------ GFR: Estimated Creatinine Clearance: 73.3 mL/min (by C-G formula based on SCr of 0.76 mg/dL). Liver Function Tests: Recent Labs  Lab 08/26/21 1838  AST 28  ALT 20  ALKPHOS 57  BILITOT 0.8  PROT 7.7  ALBUMIN 4.0   No results for input(s): LIPASE, AMYLASE in the last 168 hours. No results for input(s): AMMONIA in the last 168 hours. Coagulation Profile: Recent Labs  Lab 08/26/21 1838  INR 1.8*   Cardiac Enzymes: No results for input(s): CKTOTAL, CKMB, CKMBINDEX, TROPONINI in the last 168 hours. BNP (last 3 results) No results for input(s): PROBNP in the last 8760 hours. HbA1C: No results for input(s): HGBA1C in the last 72 hours. CBG: No results for input(s): GLUCAP in the last 168 hours. Lipid Profile: No results for input(s): CHOL, HDL, LDLCALC, TRIG, CHOLHDL, LDLDIRECT in the last 72 hours. Thyroid Function Tests: No results for input(s): TSH, T4TOTAL, FREET4, T3FREE, THYROIDAB in the last 72 hours. Anemia Panel: No results for input(s): VITAMINB12, FOLATE, FERRITIN, TIBC, IRON, RETICCTPCT in the last 72 hours.  --------------------------------------------------------------------------------------------------------------- Urine analysis:    Component Value Date/Time   COLORURINE AMBER (A) 08/26/2021 2105   APPEARANCEUR TURBID (A) 08/26/2021 2105   LABSPEC 1.019 08/26/2021 2105   PHURINE 8.0 08/26/2021 2105   GLUCOSEU NEGATIVE 08/26/2021 2105   HGBUR SMALL (A) 08/26/2021 2105   BILIRUBINUR NEGATIVE 08/26/2021 2105   KETONESUR 5 (A) 08/26/2021 2105   PROTEINUR 100 (A) 08/26/2021 2105   UROBILINOGEN 0.2 01/04/2011 0915   NITRITE POSITIVE (A) 08/26/2021 2105   LEUKOCYTESUR LARGE (A) 08/26/2021 2105      Imaging Results:    DG  Chest Port 1 View  Result Date: 08/26/2021 CLINICAL DATA:  Sepsis EXAM: PORTABLE CHEST 1 VIEW COMPARISON:  03/21/2021 FINDINGS: Chronic interstitial thickening is unchanged. Lungs are otherwise clear. No pneumothorax or pleural effusion. Cardiac size within normal limits. Pulmonary vascularity is normal. Osseous structures are age-appropriate. No acute bone abnormality. IMPRESSION: No active disease. Electronically Signed   By: Fidela Salisbury M.D.   On: 08/26/2021 19:08    My personal review of EKG: Rhythm afib, Rate 97/min, QTc 436 ,no Acute ST changes   Assessment & Plan:    Active Problems:   Obesity   Hypertension   History of pulmonary embolism   Acute lower UTI   Generalized weakness   Diabetes mellitus with complication (HCC)   Sepsis ([place])   Cellulitis   Sepsis  Sepsis secondary to UTI versus cellulitis SIRS criteria fever 101.9, tachycardia 104, tachypnea 31, leukocytosis 14.3 Most likely source of infection is cellulitis -patient seems to be chronically colonized with several rounds of antibiotics and chronic dysuria Life threatening organ dysfunction 2/2 infection is evidenced by: Lactic acidosis 3.2 Antibiotics started Rocephin Fluids given prior to admission 2 L bolus, 150  mils per hour Sepsis order set utilized Blood cultures and urine culture pending Trend lactic acid Monitor this patient on telemetry Acute metabolic encephalopathy Son reports very brief period of confusion prior to arriving in the ED At baseline at my exam Likely secondary to above Generalized weakness and fall Secondary to above Nonfocal PT eval and treat DMII Hold metformin Sliding scale coverage Carb modified diet Obesity Extensive counseling on the importance of weight loss and healthy food choices HTN Blood pressure soft in the ED 804/58, holding home blood pressure medications  Afib Continue Tikosyn and warfarin Check INR in the a.m. Hx of PE and DVT Continue warfarin as  above     DVT Prophylaxis-   warfarin  AM Labs Ordered, also please review Full Orders  Family Communication: Admission, patients condition and plan of care including tests being ordered have been discussed with the patient and son who indicate understanding and agree with the plan and Code Status.  Code Status: Full  Admission status:Inpatient :The appropriate admission status for this patient is INPATIENT. Inpatient status is judged to be reasonable and necessary in order to provide the required intensity of service to ensure the patient's safety. The patient's presenting symptoms, physical exam findings, and initial radiographic and laboratory data in the context of their chronic comorbidities is felt to place them at high risk for further clinical deterioration. Furthermore, it is not anticipated that the patient will be medically stable for discharge from the hospital within 2 midnights of admission. The following factors support the admission status of inpatient.     The patient's presenting symptoms include generalized weakness. The worrisome physical exam findings include generalized weakness, erythema and swelling of the right lower extremity The initial radiographic and laboratory data are worrisome because of UA is indicative of UTI, leukocytosis, lactic acidosis The chronic co-morbidities include hypertension, diabetes mellitus type 2, atrial fibrillation.       * I certify that at the point of admission it is my clinical judgment that the patient will require inpatient hospital care spanning beyond 2 midnights from the point of admission due to high intensity of service, high risk for further deterioration and high frequency of surveillance required.*  Disposition: Anticipated Discharge  72hrs Discharge to home  Time spent in minutes : [place] DO

## 2021-08-26 NOTE — ED Provider Notes (Signed)
[facility] EMERGENCY DEPARTMENT Provider Note   CSN: [id number] Arrival date & time: 08/26/21  1755     History Chief Complaint  Patient presents with   Fatigue    Natasha Glass is a 80 y.o. female.  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 fib currently on Eliquis, hypertension, type 2 diabetes, sleep apnea presents with chief complaint of weakness.  Patient states that she has felt weak since today, she states that she took a nap in her chair and was unable to get out of it, she states that she tried to stand up but slid out from her chair and onto the floor.  sHe was unable to get up.  She had to have assistance and was later brought to the emergency department.  She denies  headaches, change in vision, denies unilateral paresthesias or weakness.  She denies  fevers, chills, nasal ingestion, sore throat, cough, chest pain, shortness of breath, stomach pain, nausea, vomit, diarrhea, general body aches.  She does endorse that she is having increased burning when she urinates, states been going on for the last couple weeks, denies flank pain, side pain, dysuria, hematuria or decreased urinary frequency.  She has no other complaints at this time.  States that she been compliant with all of her medications.  Spoke with son who was at bedside he was able to validate the story, states that she was completely fine yesterday but today she  become more confused, and weak, but he denies any recent falls, has not noticed any cough, fever, nausea, vomit, diarrhea or urinary symptoms.  Past Medical History:  Diagnosis Date   Anemia    Arthritis    "legs, hands, knees, back" (04/10/2016)   Atrial fibrillation ([place]) 08/2018   DVT (deep venous thrombosis) ([place]) 01/2009   Dyspnea    due to weight    GERD (gastroesophageal reflux disease)    occ.   Gout    Headache    "at least weekly" (04/10/2016)   History of blood transfusion 1990s   "related to  ulcerative colitis"   History of colonic polyps 09/2010   Hyperlipidemia    Hypertension    Lactose intolerance    Macular degeneration, dry    bilateral   Migraine    "q 2-3 months" (04/10/2016)   Obesity    Osteopenia    Peripheral neuropathy    Pneumonia 1970s   PONV (postoperative nausea and vomiting)    hx of years ago none recent    Pulmonary embolism ([place]) 01/2009   Shortness of breath on exertion    Sleep apnea    cpap pressure 4.0-16.0    Type II diabetes mellitus (HCC)    type II    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)    Urinary incontinence    wears peripad    Patient Active Problem List   Diagnosis Date Noted   Sepsis ([place]) 08/26/2021   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/2019   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   Pain in joint of right shoulder 09/22/2018   Persistent atrial fibrillation ([place]) 08/18/2018   PAF (paroxysmal atrial fibrillation) (HCC)    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 ([place]) 01/19/2018   Acute respiratory failure with hypoxia ([place]) 04/11/2016   UTI (lower urinary tract infection) 04/10/2016   Generalized weakness 04/10/2016   Nausea 04/10/2016   Diabetes ([place]) 04/10/2016   Diabetes mellitus with complication ([place])  Ascending aortic aneurysm ([place]) 05/18/2014   Ulcerative colitis ([place]) 05/07/2012   Obesity 05/07/2012   Hypertension 05/07/2012   History of pulmonary embolism 05/07/2012    Past Surgical History:  Procedure Laterality Date   [place] N/A 05/07/2018   Procedure: CARDIOVERSION;  Surgeon: Josue Hector, MD;  Location: [facility] ENDOSCOPY;  Service: Cardiovascular;  Laterality: N/A;   COLONOSCOPY W/ BIOPSIES AND POLYPECTOMY  2005; 2011; 2012   COLONOSCOPY WITH PROPOFOL N/A 09/10/2016   Procedure: COLONOSCOPY WITH PROPOFOL;  Surgeon: Garlan Fair, MD;  Location: [facility] ENDOSCOPY;  Service: Endoscopy;  Laterality: N/A;   JOINT REPLACEMENT     KNEE ARTHROSCOPY Left ~ [place] Left 2011   TOTAL KNEE ARTHROPLASTY Bilateral 2005-2012   left-right     OB History   No obstetric history on file.     Family History  Problem Relation Age of Onset   Colon cancer Mother    Lung cancer Mother    Heart disease Father     Social History   Tobacco Use   Smoking status: Never   Smokeless tobacco: Never  Vaping Use   Vaping Use: Never used  Substance Use Topics   Alcohol use: No   Drug use: No    Home Medications Prior to Admission medications   Medication Sig Start Date End Date Taking? Authorizing Provider  acetaminophen (TYLENOL) 500 MG tablet Take 500 mg by mouth at bedtime.   Yes [provider]  allopurinol (ZYLOPRIM) 300 MG tablet Take 150 mg by mouth every evening.  04/19/14  Yes [provider]  Cholecalciferol (VITAMIN D3) 25 MCG (1000 UT) CAPS Take 3 capsules by mouth daily in the afternoon.   Yes [provider]  clobetasol cream (TEMOVATE) 0.16 % Apply 1 application topically as needed.   Yes [provider]  dofetilide (TIKOSYN) 250 MCG capsule TAKE 1 CAPSULE BY MOUTH TWICE DAILY 01/23/21  Yes Sherran Needs, NP  furosemide (LASIX) 40 MG tablet Take 60 mg by mouth daily. 04/05/21  Yes [provider]  HYDROcodone-acetaminophen (NORCO/VICODIN) 5-325 MG tablet Take 1 tablet by mouth as needed. 08/28/19  Yes [provider]  Hyprom-Naphaz-Polysorb-Zn Sulf (CLEAR EYES COMPLETE) SOLN Place 2 drops into both eyes 2 (two) times daily.   Yes [provider]  losartan (COZAAR) 25 MG tablet Take 25 mg by mouth daily.   Yes [provider]  magnesium oxide (MAG-OX) 400 MG tablet Take 1 tablet in the AM and 1/2 tablet in the PM 04/19/21  Yes Sherran Needs, NP  metFORMIN (GLUCOPHAGE) 500 MG tablet Take 500 mg by mouth 2 (two)  times daily with a meal.  04/19/14  Yes [provider]  metoprolol tartrate (LOPRESSOR) 25 MG tablet Take 25-50 mg by mouth See admin instructions. Take 2 tablets in the morning, and 2 tablets in the evening.   Yes [provider]  Multiple Vitamins-Minerals (PRESERVISION AREDS 2) CAPS Take 2 capsules by mouth daily.   Yes [provider]  potassium chloride SA (K-DUR,KLOR-CON) 20 MEQ tablet Take 1.5 tablets (30 mEq total) by mouth daily. 08/22/18  Yes Baldwin [place], PA-C  warfarin (COUMADIN) 5 MG tablet Take 5 mg by mouth daily. 5 mg daily except for Monday take 1/2 tablet   Yes [provider]  ONETOUCH ULTRA test strip  08/16/19  [provider]  SSD 1 % cream Apply topically 2 (two) times daily. Patient not taking: No sig reported 08/15/20   [provider]  vitamin B-12 (CYANOCOBALAMIN) 500 MCG tablet Take 500 mcg by mouth daily.    [provider]    Allergies    Bactrim [sulfamethoxazole-trimethoprim], Morphine and related, Oysters [shellfish allergy], Amlodipine, Percocet [oxycodone-acetaminophen], Tdap [tetanus-diphth-acell pertussis], Tetanus toxoids, Tramadol, Carbamazepine, Lactose intolerance (gi), Macrodantin [nitrofurantoin], Tetanus-diphtheria toxoids td, Topiramate, Diovan [valsartan], Diphtheria toxoid, and Macrodantin [nitrofurantoin macrocrystal]  Review of Systems   Review of Systems  Constitutional:  Negative for chills and fever.  HENT:  Negative for congestion.   Respiratory:  Negative for shortness of breath.   Cardiovascular:  Negative for chest pain.  Gastrointestinal:  Negative for abdominal pain, diarrhea, nausea and vomiting.  Genitourinary:  Positive for dysuria. Negative for enuresis, flank pain and frequency.  Musculoskeletal:  Negative for back pain.  Skin:  Negative for rash.  Neurological:  Positive for weakness. Negative for dizziness.  Hematological:  Does not bruise/bleed easily.    Physical Exam Updated Vital Signs BP (!) 104/58 (BP Location: Right Wrist)   Pulse 85   Temp 98.4 F (36.9 C) (Oral)   Resp 18   Ht 5' 3"  (1.6 m)   Wt 128.4 kg   SpO2 92%   BMI 50.13 kg/m   Physical Exam Vitals and nursing note reviewed.  Constitutional:      General: She is not in acute distress.    Appearance: She is ill-appearing and toxic-appearing.     Comments: Vital signs noted for tachycardia, oral temperature of 100.2, nontachypneic, nonhypoxic, she was ill-appearing, but alert did not appear to be in any acute distress.  HENT:     Head: Normocephalic and atraumatic.     Nose: No congestion.     Mouth/Throat:     Mouth: Mucous membranes are moist.     Pharynx: Oropharynx is clear. No oropharyngeal exudate or posterior oropharyngeal erythema.  Eyes:     Conjunctiva/sclera: Conjunctivae normal.  Cardiovascular:     Rate and Rhythm: Tachycardia present. Rhythm irregular.     Pulses: Normal pulses.     Heart sounds: No murmur heard.   No friction rub. No gallop.  Pulmonary:     Effort: No respiratory distress.     Breath sounds: Rales present. No wheezing or rhonchi.     Comments: Patient slight rales heard in the right lower lobe, no wheezing, rhonchi, stridor present.  Able to speak in full sentences.  No acute respiratory distress. Abdominal:     Palpations: Abdomen is soft.     Tenderness: There is no abdominal tenderness. There is no right CVA tenderness or left CVA tenderness.  Musculoskeletal:     Comments: Patient is able to move all 4 extremities, neurovascular is fully intact.  She has noted bilateral nonpitting edema with chronic lymphedema skin changes, but she has erythema noted on the right leg from the ankle to the mid shin, there is no ulcers present, no drainage or discharge noted.  It was warm to the touch nontender to palpation.  Skin:    General: Skin is warm and dry.  Neurological:     Mental Status: She is alert and oriented to person, place,  and time.     Comments: No facial asymmetry, no difficult word finding, able follow two-step commands, no unilateral weakness present.  Psychiatric:        Mood and Affect: Mood normal.  ED Results / Procedures / Treatments   Labs (all labs ordered are listed, but only abnormal results are displayed) Labs Reviewed  LACTIC ACID, PLASMA - Abnormal; Notable for the following components:      Result Value   Lactic Acid, Venous 3.2 (*)    All other components within normal limits  LACTIC ACID, PLASMA - Abnormal; Notable for the following components:   Lactic Acid, Venous 3.2 (*)    All other components within normal limits  COMPREHENSIVE METABOLIC PANEL - Abnormal; Notable for the following components:   Glucose, Bld 159 (*)    All other components within normal limits  CBC WITH DIFFERENTIAL/PLATELET - Abnormal; Notable for the following components:   WBC 14.3 (*)    RDW 17.3 (*)    Platelets 126 (*)    Neutro Abs 12.6 (*)    Abs Immature Granulocytes 0.16 (*)    All other components within normal limits  PROTIME-INR - Abnormal; Notable for the following components:   Prothrombin Time 20.9 (*)    INR 1.8 (*)    All other components within normal limits  URINALYSIS, ROUTINE W REFLEX MICROSCOPIC - Abnormal; Notable for the following components:   Color, Urine AMBER (*)    APPearance TURBID (*)    Hgb urine dipstick SMALL (*)    Ketones, ur 5 (*)    Protein, ur 100 (*)    Nitrite POSITIVE (*)    Leukocytes,Ua LARGE (*)    WBC, UA >50 (*)    All other components within normal limits  RESP PANEL BY RT-PCR (FLU A&B, COVID) ARPGX2  CULTURE, BLOOD (ROUTINE X 2)  CULTURE, BLOOD (ROUTINE X 2)  URINE CULTURE  APTT    EKG EKG Interpretation  Date/Time:  Sunday August 26 2021 18:04:18 EST Ventricular Rate:  97 PR Interval:  225 QRS Duration: 99 QT Interval:  343 QTC Calculation: 436 R Axis:   79 Text Interpretation: Sinus rhythm Atrial premature complexes Prolonged PR  interval Low voltage, extremity and precordial leads Confirmed by Noemi Chapel [phone]) on 08/26/2021 9:41:28 PM  Radiology DG Chest Port 1 View  Result Date: 08/26/2021 CLINICAL DATA:  Sepsis EXAM: PORTABLE CHEST 1 VIEW COMPARISON:  03/21/2021 FINDINGS: Chronic interstitial thickening is unchanged. Lungs are otherwise clear. No pneumothorax or pleural effusion. Cardiac size within normal limits. Pulmonary vascularity is normal. Osseous structures are age-appropriate. No acute bone abnormality. IMPRESSION: No active disease. Electronically Signed   By: Fidela Salisbury M.D.   On: 08/26/2021 19:08    Procedures .Critical Care Performed by: Marcello Fennel, PA-C Authorized by: Marcello Fennel, PA-C   Critical care provider statement:    Critical care time (minutes):  45   Critical care time was exclusive of:  Separately billable procedures and treating other patients   Critical care was necessary to treat or prevent imminent or life-threatening deterioration of the following conditions:  Sepsis   Critical care was time spent personally by me on the following activities:  Ordering and performing treatments and interventions, ordering and review of laboratory studies, ordering and review of radiographic studies, pulse oximetry, re-evaluation of patient's condition, review of old charts, examination of patient, evaluation of patient's response to treatment, discussions with consultants and development of treatment plan with patient or surrogate   I assumed direction of critical care for this patient from another provider in my specialty: no     Care discussed with: admitting provider     Medications Ordered in ED Medications  lactated  ringers infusion ( Intravenous New Bag/Given 08/26/21 1949)  sodium chloride 0.9 % bolus 1,000 mL (0 mLs Intravenous Stopped 08/26/21 2012)  cefTRIAXone (ROCEPHIN) 2 g in sodium chloride 0.9 % 100 mL IVPB (0 g Intravenous Stopped 08/26/21 1936)  sodium  chloride 0.9 % bolus 1,000 mL (0 mLs Intravenous Stopped 08/26/21 2210)  acetaminophen (TYLENOL) tablet 650 mg (650 mg Oral Given 08/26/21 2014)    ED Course  I have reviewed the triage vital signs and the nursing notes.  Pertinent labs & imaging results that were available during my care of the patient were reviewed by me and considered in my medical decision making (see chart for details).    MDM Rules/Calculators/A&P                          Initial impression-presents with weakness, she is alert, toxic appearing, vital signs consistent with sepsis.  Will activate code sepsis, likely due to UTI, start her on antibiotics and continue to monitor.  Work-up-CBC shows leukocytosis of 14.3, CMP shows glucose of 159, prothrombin time 20.9, INR 1.8, APTT 34, lactic lactic 3.2, second lactic 3.2.  UA consistent with UTI positive nitrates, leukocytes, red blood cells, many white blood cells.  Respiratory panel negative.  Chest x-ray unremarkable, EKG A. fib without signs of ischemia.  Reassessment-patient is reassessed, she is having no complaints this time, vital signs remained stable, will continue to monitor.  Suspect this is likely UTI.  Despite 2 L of fluid patient has not urinated, will BladderScan and if urine present will obtain In-N-Out cath for further evaluation.  UA consistent with UTI, will continue with antibiotic course, recommend admission for further treatment of UTI as well as cellulitis.  Patient and son are agreeable this plan will consult hospitalist for admission.  Consult-spoke with Dr. Nori Riis who will admit the patient.  Rule out-I have low suspicion for ACS as patient denies chest pain, shortness of breath, EKG without signs of ischemia.  She was noted to be tachycardic but likely this is secondary due to infection from her UTI and/or cellulitis of the right lower leg.  I have low suspicion for PE as she is nontachypneic, nonhypoxic, presentation atypical of etiology.  I have  low suspicion for CVA or intracranial head bleed as patient denies headaches, change in vision, paresthesia weakness upper/ lower extremities, no focal deficit present on my exam.  She is on Coumadin and she had a fall but she really slid onto her bottom there is no head trauma present very low suspicion for head bleed will defer imaging at this time.  I have low suspicion for DVT of the right lower leg as there is no unilateral swelling, she does have erythema which I suspect is likely due to cellulitis, would defer on DVT study but if symptoms not improve after antibiotic use would recommend follow-up with DVT study.  Plan-admission for sepsis likely multifactorial UTI as well as right lower leg cellulitis.  Final Clinical Impression(s) / ED Diagnoses Final diagnoses:  Sepsis without acute organ dysfunction, due to unspecified organism [facility])  Acute cystitis with hematuria  Cellulitis of right lower extremity    Rx / DC Orders ED Discharge Orders     None        Aron Baba 08/26/21 2237    Noemi Chapel, MD 08/28/21 [phone]

## 2021-08-26 NOTE — ED Notes (Signed)
Bladder scan showed 126m

## 2021-08-26 NOTE — ED Notes (Signed)
Pt soiled with foul smelling urine and stool. Pericare provided, brief placed, and purwick in place.

## 2021-08-27 ENCOUNTER — Inpatient Hospital Stay (HOSPITAL_COMMUNITY): Payer: Medicare Other

## 2021-08-27 ENCOUNTER — Encounter (HOSPITAL_COMMUNITY): Payer: Self-pay | Admitting: Family Medicine

## 2021-08-27 DIAGNOSIS — I480 Paroxysmal atrial fibrillation: Secondary | ICD-10-CM | POA: Diagnosis not present

## 2021-08-27 DIAGNOSIS — I10 Essential (primary) hypertension: Secondary | ICD-10-CM

## 2021-08-27 DIAGNOSIS — G4733 Obstructive sleep apnea (adult) (pediatric): Secondary | ICD-10-CM

## 2021-08-27 DIAGNOSIS — L0390 Cellulitis, unspecified: Secondary | ICD-10-CM | POA: Diagnosis present

## 2021-08-27 LAB — GLUCOSE, CAPILLARY
Glucose-Capillary: 163 mg/dL — ABNORMAL HIGH (ref 70–99)
Glucose-Capillary: 109 mg/dL — ABNORMAL HIGH (ref 70–99)
Glucose-Capillary: 183 mg/dL — ABNORMAL HIGH (ref 70–99)
Glucose-Capillary: 160 mg/dL — ABNORMAL HIGH (ref 70–99)

## 2021-08-27 LAB — CBC WITH DIFFERENTIAL/PLATELET
Abs Immature Granulocytes: 0.09 10*3/uL — ABNORMAL HIGH (ref 0.00–0.07)
Basophils Absolute: 0 10*3/uL (ref 0.0–0.1)
Basophils Relative: 0 %
Eosinophils Absolute: 0 10*3/uL (ref 0.0–0.5)
Eosinophils Relative: 0 %
HCT: 37.1 % (ref 36.0–46.0)
Hemoglobin: 11.5 g/dL — ABNORMAL LOW (ref 12.0–15.0)
Immature Granulocytes: 1 %
Lymphocytes Relative: 11 %
Lymphs Abs: 1 10*3/uL (ref 0.7–4.0)
MCH: 28.9 pg (ref 26.0–34.0)
MCHC: 31 g/dL (ref 30.0–36.0)
MCV: 93.2 fL (ref 80.0–100.0)
Monocytes Absolute: 0.6 10*3/uL (ref 0.1–1.0)
Monocytes Relative: 7 %
Neutro Abs: 7 10*3/uL (ref 1.7–7.7)
Neutrophils Relative %: 81 %
RBC: 3.98 MIL/uL (ref 3.87–5.11)
RDW: 17.4 % — ABNORMAL HIGH (ref 11.5–15.5)
Smear Review: UNDETERMINED
WBC: 8.6 10*3/uL (ref 4.0–10.5)
nRBC: 0 % (ref 0.0–0.2)

## 2021-08-27 LAB — COMPREHENSIVE METABOLIC PANEL
ALT: 18 U/L (ref 0–44)
AST: 32 U/L (ref 15–41)
Albumin: 3.2 g/dL — ABNORMAL LOW (ref 3.5–5.0)
Alkaline Phosphatase: 48 U/L (ref 38–126)
Anion gap: 6 (ref 5–15)
BUN: 20 mg/dL (ref 8–23)
CO2: 26 mmol/L (ref 22–32)
Calcium: 8.3 mg/dL — ABNORMAL LOW (ref 8.9–10.3)
Chloride: 106 mmol/L (ref 98–111)
Creatinine, Ser: 0.61 mg/dL (ref 0.44–1.00)
GFR, Estimated: >60 mL/min (ref >60)
Glucose, Bld: 146 mg/dL — ABNORMAL HIGH (ref 70–99)
Potassium: 3.6 mmol/L (ref 3.5–5.1)
Sodium: 138 mmol/L (ref 135–145)
Total Bilirubin: 0.6 mg/dL (ref 0.3–1.2)
Total Protein: 6.4 g/dL — ABNORMAL LOW (ref 6.5–8.1)

## 2021-08-27 LAB — PROTIME-INR
INR: 1.9 — ABNORMAL HIGH (ref 0.8–1.2)
Prothrombin Time: 21.7 seconds — ABNORMAL HIGH (ref 11.4–15.2)

## 2021-08-27 LAB — CORTISOL-AM, BLOOD: Cortisol - AM: 17 ug/dL (ref 6.7–22.6)

## 2021-08-27 LAB — HEMOGLOBIN A1C
Hgb A1c MFr Bld: 6.2 % — ABNORMAL HIGH (ref 4.8–5.6)
Mean Plasma Glucose: 131.24 mg/dL

## 2021-08-27 LAB — PROCALCITONIN
Procalcitonin: <0.1 ng/mL
Procalcitonin: <0.1 ng/mL

## 2021-08-27 LAB — MAGNESIUM: Magnesium: 1.8 mg/dL (ref 1.7–2.4)

## 2021-08-27 LAB — LACTIC ACID, PLASMA
Lactic Acid, Venous: 1.7 mmol/L (ref 0.5–1.9)
Lactic Acid, Venous: 1.5 mmol/L (ref 0.5–1.9)

## 2021-08-27 LAB — CBG MONITORING, ED: Glucose-Capillary: 115 mg/dL — ABNORMAL HIGH (ref 70–99)

## 2021-08-27 MED ORDER — HYDROCODONE-ACETAMINOPHEN 5-325 MG PO TABS
1.0000 | ORAL_TABLET | Freq: Four times a day (QID) | ORAL | Status: DC | PRN
  Administered 2021-08-27 – 2021-08-29 (×3): 1 via ORAL
  Filled 2021-08-27 (×5): qty 1

## 2021-08-27 MED ORDER — NYSTATIN 100000 UNIT/GM EX OINT
TOPICAL_OINTMENT | Freq: Two times a day (BID) | CUTANEOUS | Status: DC
  Administered 2021-08-27 – 2021-08-30 (×2): 1 via TOPICAL
  Filled 2021-08-27: qty 15

## 2021-08-27 MED ORDER — SODIUM CHLORIDE 0.9 % IV SOLN
2.0000 g | INTRAVENOUS | Status: DC
  Administered 2021-08-27 – 2021-08-29 (×3): 2 g via INTRAVENOUS
  Filled 2021-08-27 (×3): qty 20

## 2021-08-27 MED ORDER — METOPROLOL TARTRATE 5 MG/5ML IV SOLN: 5.0000 mg | INTRAVENOUS | Status: DC | PRN

## 2021-08-27 MED ORDER — METOPROLOL TARTRATE 50 MG PO TABS
50.0000 mg | ORAL_TABLET | Freq: Two times a day (BID) | ORAL | Status: DC
  Administered 2021-08-27 – 2021-08-29 (×4): 50 mg via ORAL
  Filled 2021-08-27 (×4): qty 1

## 2021-08-27 MED ORDER — MAGNESIUM SULFATE 2 GM/50ML IV SOLN
2.0000 g | Freq: Once | INTRAVENOUS | Status: AC
  Administered 2021-08-27: 2 g via INTRAVENOUS
  Filled 2021-08-27: qty 50

## 2021-08-27 MED ORDER — MAGNESIUM OXIDE -MG SUPPLEMENT 400 (240 MG) MG PO TABS
400.0000 mg | ORAL_TABLET | Freq: Every day | ORAL | Status: DC
  Administered 2021-08-27 – 2021-08-28 (×2): 400 mg via ORAL
  Filled 2021-08-27 (×2): qty 1

## 2021-08-27 MED ORDER — ACETAMINOPHEN 650 MG RE SUPP: 650.0000 mg | Freq: Four times a day (QID) | RECTAL | Status: DC | PRN

## 2021-08-27 MED ORDER — VITAMIN B-12 1000 MCG PO TABS
500.0000 ug | ORAL_TABLET | Freq: Every day | ORAL | Status: DC
  Administered 2021-08-27 – 2021-08-30 (×4): 500 ug via ORAL
  Filled 2021-08-27 (×4): qty 1

## 2021-08-27 MED ORDER — LACTATED RINGERS IV SOLN: INTRAVENOUS | Status: DC

## 2021-08-27 MED ORDER — INSULIN ASPART 100 UNIT/ML IJ SOLN
0.0000 [IU] | Freq: Three times a day (TID) | INTRAMUSCULAR | Status: DC
  Administered 2021-08-27: 3 [IU] via SUBCUTANEOUS
  Administered 2021-08-28 (×2): 2 [IU] via SUBCUTANEOUS

## 2021-08-27 MED ORDER — DOFETILIDE 250 MCG PO CAPS
250.0000 ug | ORAL_CAPSULE | Freq: Two times a day (BID) | ORAL | Status: DC
  Administered 2021-08-27 – 2021-08-30 (×7): 250 ug via ORAL
  Filled 2021-08-27 (×11): qty 1

## 2021-08-27 MED ORDER — ACETAMINOPHEN 325 MG PO TABS
650.0000 mg | ORAL_TABLET | Freq: Four times a day (QID) | ORAL | Status: DC | PRN
  Administered 2021-08-27 – 2021-08-30 (×4): 650 mg via ORAL
  Filled 2021-08-27 (×4): qty 2

## 2021-08-27 MED ORDER — POTASSIUM CHLORIDE CRYS ER 20 MEQ PO TBCR
20.0000 meq | EXTENDED_RELEASE_TABLET | Freq: Every day | ORAL | Status: DC
  Administered 2021-08-27 – 2021-08-28 (×2): 20 meq via ORAL
  Filled 2021-08-27 (×2): qty 1

## 2021-08-27 MED ORDER — ONDANSETRON HCL 4 MG PO TABS: 4.0000 mg | ORAL_TABLET | Freq: Four times a day (QID) | ORAL | Status: DC | PRN

## 2021-08-27 MED ORDER — ONDANSETRON HCL 4 MG/2ML IJ SOLN: 4.0000 mg | Freq: Four times a day (QID) | INTRAMUSCULAR | Status: DC | PRN

## 2021-08-27 MED ORDER — WARFARIN - PHYSICIAN DOSING INPATIENT: Freq: Every day | Status: DC

## 2021-08-27 MED ORDER — METOPROLOL SUCCINATE ER 50 MG PO TB24
50.0000 mg | ORAL_TABLET | Freq: Every day | ORAL | Status: DC
Start: 2021-08-27 — End: 2021-08-27
  Administered 2021-08-27: 50 mg via ORAL
  Filled 2021-08-27: qty 1

## 2021-08-27 MED ORDER — DILTIAZEM HCL 25 MG/5ML IV SOLN
5.0000 mg | Freq: Once | INTRAVENOUS | Status: AC
  Administered 2021-08-27: 5 mg via INTRAVENOUS
  Filled 2021-08-27: qty 5

## 2021-08-27 MED ORDER — WARFARIN SODIUM 5 MG PO TABS
5.0000 mg | ORAL_TABLET | Freq: Every day | ORAL | Status: DC
  Administered 2021-08-27 – 2021-08-29 (×3): 5 mg via ORAL
  Filled 2021-08-27 (×3): qty 1

## 2021-08-27 MED ORDER — DILTIAZEM HCL 30 MG PO TABS
30.0000 mg | ORAL_TABLET | Freq: Four times a day (QID) | ORAL | Status: DC | PRN
  Administered 2021-08-27: 30 mg via ORAL
  Filled 2021-08-27: qty 1

## 2021-08-27 MED ORDER — LACTATED RINGERS IV SOLN: INTRAVENOUS | Status: AC

## 2021-08-27 MED ORDER — HYDROCODONE-ACETAMINOPHEN 5-325 MG PO TABS: 1.0000 | ORAL_TABLET | ORAL | Status: DC | PRN

## 2021-08-27 NOTE — Progress Notes (Addendum)
   08/27/21 1252  Vitals  BP 115/81  MAP (mmHg) 89  BP Location Left Wrist  BP Method Automatic  Patient Position (if appropriate) Sitting  Pulse Rate (!) 123  Pulse Rate Source Dinamap  ECG Heart Rate (!) 123  Resp 20  MEWS COLOR  MEWS Score Color Yellow  Oxygen Therapy  SpO2 96 %  O2 Device Room Air  Pain Assessment  Pain Scale 0-10  Pain Score 4  MEWS Score  MEWS Temp 0  MEWS Systolic 0  MEWS Pulse 2  MEWS RR 0  MEWS LOC 0  MEWS Score 2   Pt remains up in chair, states HA much improved since oral pain med admin'd. Pt denies any SOB or CP. Pt's heart rate 120-130 bpm, afib. Other VSS. Skin W&D, color pink. Pt alert and talkative, oriented x4. Pt states that she has been trying to move her left knee and exercise it. New orders received from MD [facility] related to heart rate.

## 2021-08-27 NOTE — Consult Note (Signed)
Cardiology Consultation:   Patient ID: Natasha Glass; [id number]; 09-Jun-1941   Admit date: 08/26/2021 Date of Consult: 08/27/2021  Primary Care Provider: Josetta Huddle, MD Primary Electrophysiologist: Thompson Grayer, MD   Patient Profile:   Natasha Glass is an 80 y.o. female with a history of paroxysmal atrial fibrillation, type 2 diabetes mellitus, asymptomatic ascending thoracic aortic aneurysm, hypertension, hyperlipidemia, and previous DVT/pulmonary embolus in 2010 who is being seen today for the evaluation of recurrent atrial fibrillation at the request of Dr. Roger Shelter.  History of Present Illness:   Natasha Glass is currently admitted to the hospital with suspected sepsis in the setting of UTI and lower extremity cellulitis.  In the course of her work-up and subsequent admission, she has not received her regular metoprolol or Tikosyn doses on a scheduled basis.  Per review of telemetry I see that she was in sinus rhythm with heart rate in the 90s earlier this morning, between 10 and 11 a.m. went into rapid atrial fibrillation.  She was evaluated by Dr. Roger Shelter who clarified her metoprolol and Tikosyn orders, also gave her supplemental intravenous diltiazem boluses.  She is in no distress, aware of a sense of palpitations.  She tells me that she feels breakthrough atrial fibrillation at least a few times within a period of 6 months, sometimes it last for several hours at a time.  She generally tries to "relax" when this happens and does not make any other medication adjustments.  She has follow-up with Dr. Rayann Heman and mainly in the atrial fibrillation clinic, saw Ms. Kayleen Memos NP in July of this year, I reviewed the note.  CHA2DS2-VASc score is 5 and she is on Coumadin for stroke prophylaxis.  Past Medical History:  Diagnosis Date   Anemia    Arthritis    DVT (deep venous thrombosis) ([place]) 01/2009   GERD (gastroesophageal reflux disease)    Gout    History of blood transfusion     1990's   History of colonic polyps 09/2010   History of pneumonia    Hyperlipidemia    Hypertension    Lactose intolerance    Macular degeneration, dry    bilateral   Migraine    Obesity    Osteopenia    PAF (paroxysmal atrial fibrillation) (HCC)    Peripheral neuropathy    Pulmonary embolism ([place]) 01/2009   Sleep apnea    CPAP pressure 4.0-16.0   Thoracic ascending aortic aneurysm    Type II diabetes mellitus (HCC)    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)    Urinary incontinence     Past Surgical History:  Procedure Laterality Date   [place] N/A 05/07/2018   Procedure: CARDIOVERSION;  Surgeon: Josue Hector, MD;  Location: [facility] ENDOSCOPY;  Service: Cardiovascular;  Laterality: N/A;   COLONOSCOPY W/ BIOPSIES AND POLYPECTOMY  2005; 2011; 2012   COLONOSCOPY WITH PROPOFOL N/A 09/10/2016   Procedure: COLONOSCOPY WITH PROPOFOL;  Surgeon: Garlan Fair, MD;  Location: [facility] ENDOSCOPY;  Service: Endoscopy;  Laterality: N/A;   JOINT REPLACEMENT     KNEE ARTHROSCOPY Left ~ [place] Left 2011   TOTAL KNEE ARTHROPLASTY Bilateral 2005-2012   left-right     Inpatient Medications: Scheduled Meds:  dofetilide  250 mcg Oral BID   insulin aspart  0-15 Units Subcutaneous TID WC   metoprolol succinate  50 mg Oral Daily  nystatin ointment   Topical BID   vitamin B-12  500 mcg Oral Daily   warfarin  5 mg Oral q1600   Warfarin - Physician Dosing Inpatient   Does not apply q1600   Continuous Infusions:  cefTRIAXone (ROCEPHIN)  IV     lactated ringers     PRN Meds: acetaminophen **OR** acetaminophen, HYDROcodone-acetaminophen, ondansetron **OR** ondansetron (ZOFRAN) IV  Allergies:    Allergies  Allergen Reactions   Bactrim [Sulfamethoxazole-Trimethoprim]     Drop in BP   Morphine And Related Other (See Comments)    Unresponsive-Very bad  reaction.  Can take hydrocodone.    Oysters [Shellfish Allergy] Shortness Of Breath, Itching, Swelling and Rash   Amlodipine Swelling and Other (See Comments)    Of legs   Percocet [Oxycodone-Acetaminophen] Other (See Comments)    hallucinations   Tdap [Tetanus-Diphth-Acell Pertussis] Itching, Swelling and Rash   Tetanus Toxoids Itching, Swelling and Rash   Tramadol Swelling and Other (See Comments)    Of legs   Carbamazepine Other (See Comments)   Lactose Intolerance (Gi)    Macrodantin [Nitrofurantoin] Other (See Comments)   Tetanus-Diphtheria Toxoids Td Other (See Comments)   Topiramate Other (See Comments)   Diovan [Valsartan] Other (See Comments)    Cough   Diphtheria Toxoid Itching, Swelling and Rash   Macrodantin [Nitrofurantoin Macrocrystal] Other (See Comments)    unspecified    Social History:   Social History   Tobacco Use   Smoking status: Never   Smokeless tobacco: Never  Substance Use Topics   Alcohol use: No     Family History:   The patient's family history includes Colon cancer in her mother; Heart disease in her father; Lung cancer in her mother.  ROS:  Please see the history of present illness.  Leg swelling and discomfort.  All other ROS reviewed and negative.     Physical Exam/Data:   Vitals:   08/27/21 1252 08/27/21 1333 08/27/21 1347 08/27/21 1404  BP: 115/81 98/60 105/67 108/62  Pulse: (!) 123 (!) 125 (!) 101 (!) 108  Resp: 20 20 20    Temp:      TempSrc:      SpO2: 96% 96% 97%   Weight:      Height:        Intake/Output Summary (Last 24 hours) at 08/27/2021 1458 Last data filed at 08/27/2021 1300 Gross per 24 hour  Intake 1240 ml  Output --  Net 1240 ml   Filed Weights   08/26/21 1800 08/27/21 0829  Weight: 128.4 kg 127.7 kg   Body mass index is 49.88 kg/m.   Gen: Elderly woman, appears comfortable at rest sitting in bedside chair. HEENT: Conjunctiva and lids normal, oropharynx clear with moist mucosa. Neck: Supple, no  elevated JVP or carotid bruits, no thyromegaly. Lungs: Clear to auscultation, nonlabored breathing at rest. Cardiac: Largely regular on my examination, no S3, 1/6 systolic murmur, no pericardial rub. Abdomen: Soft, nontender, bowel sounds present. Extremities: Lymphedema present with erythema right lower extremity. Skin: Warm and dry. Musculoskeletal: No kyphosis. Neuropsychiatric: Alert and oriented x3, affect grossly appropriate.  EKG:  An ECG dated 08/26/2021 was personally reviewed today and demonstrated:  Sinus rhythm with PAC, prolonged PR interval, QTC 436 ms.  Telemetry:  I personally reviewed telemetry which shows rapid atrial fibrillation.  Relevant CV Studies:  Echocardiogram 06/02/2017: - Left ventricle: The cavity size was normal. Systolic function was    normal. The estimated ejection fraction was in the range of 55%  to 60%. Wall motion was normal; there were no regional wall    motion abnormalities. Left ventricular diastolic function    parameters were normal.  - Mitral valve: Calcified annulus.  - Left atrium: The atrium was mildly dilated.  - Right atrium: The atrium was mildly dilated.   Laboratory Data:  Chemistry Recent Labs  Lab 08/26/21 1838 08/27/21 0335  NA 136 138  K 4.3 3.6  CL 100 106  CO2 24 26  GLUCOSE 159* 146*  BUN 23 20  CREATININE 0.76 0.61  CALCIUM 9.2 8.3*  GFRNONAA >60 >60  ANIONGAP 12 6    Recent Labs  Lab 08/26/21 1838 08/27/21 0335  PROT 7.7 6.4*  ALBUMIN 4.0 3.2*  AST 28 32  ALT 20 18  ALKPHOS 57 48  BILITOT 0.8 0.6   Hematology Recent Labs  Lab 08/26/21 1838 08/27/21 0335  WBC 14.3* 8.6  RBC 4.66 3.98  HGB 13.4 11.5*  HCT 42.6 37.1  MCV 91.4 93.2  MCH 28.8 28.9  MCHC 31.5 31.0  RDW 17.3* 17.4*  PLT 126* DCLMP    Radiology/Studies:  DG Knee 1-2 Views Left  Result Date: 08/27/2021 CLINICAL DATA:  Knee pain EXAM: LEFT KNEE - 1-2 VIEW COMPARISON:  None. FINDINGS: Left knee arthroplasty. No fracture or  dislocation is seen. Small to moderate suprapatellar knee joint effusion. IMPRESSION: Left knee arthroplasty. Small to moderate suprapatellar knee joint effusion. Electronically Signed   By: Julian Hy M.D.   On: 08/27/2021 01:48   CT HEAD WO CONTRAST (5MM)  Result Date: 08/27/2021 CLINICAL DATA:  Weakness, fatigue EXAM: CT HEAD WITHOUT CONTRAST TECHNIQUE: Contiguous axial images were obtained from the base of the skull through the vertex without intravenous contrast. COMPARISON:  None. FINDINGS: Brain: No evidence of acute infarction, hemorrhage, hydrocephalus, extra-axial collection or mass lesion/mass effect. Mild subcortical white matter and periventricular small vessel ischemic changes. Vascular: No hyperdense vessel or unexpected calcification. Skull: Normal. Negative for fracture or focal lesion. Sinuses/Orbits: The visualized paranasal sinuses are essentially clear. The mastoid air cells are unopacified. Other: None. IMPRESSION: No evidence of acute intracranial abnormality. Mild small vessel ischemic changes. Electronically Signed   By: Julian Hy M.D.   On: 08/27/2021 01:50   DG Chest Port 1 View  Result Date: 08/26/2021 CLINICAL DATA:  Sepsis EXAM: PORTABLE CHEST 1 VIEW COMPARISON:  03/21/2021 FINDINGS: Chronic interstitial thickening is unchanged. Lungs are otherwise clear. No pneumothorax or pleural effusion. Cardiac size within normal limits. Pulmonary vascularity is normal. Osseous structures are age-appropriate. No acute bone abnormality. IMPRESSION: No active disease. Electronically Signed   By: Fidela Salisbury M.D.   On: 08/26/2021 19:08    Assessment and Plan:   1.  Paroxysmal atrial fibrillation with CHA2DS2-VASc score of 5.  She is followed regularly by Ms. Carroll NP in the atrial fibrillation clinic.  Currently on Tikosyn, Lopressor, and Coumadin as an outpatient.  She has experienced an episode of breakthrough atrial fibrillation today in the setting of sepsis due  to UTI and lower extremity cellulitis, also inconsistency in medication dosing just recently.  Baseline ECG in sinus rhythm shows QTC 436 ms.  Potassium 3.6 and magnesium 1.8.  INR is 1.9 at this point.  2.  Essential hypertension.  She is on losartan and Lasix as an outpatient, currently held.  Also on both magnesium supplement and potassium supplement as an outpatient.  3.  OSA on CPAP.  Continue Tikosyn 250 mcg twice daily and change Toprol-XL back to Lopressor 50 mg  twice daily which she was previously tolerating as an outpatient.  With current potassium and magnesium level, will resume potassium and magnesium supplement even though she is off diuretic for now.  This can be adjusted as needed.  Expect that her rhythm will convert back to sinus.  We will continue to follow.  Signed, Rozann Lesches, MD  08/27/2021 2:58 PM

## 2021-08-27 NOTE — Progress Notes (Signed)
Date and time results received: 08/27/2021 @ 1908  Test: Blood culture Critical Value: Gram (+) cocci in clusters  Name of Provider Notified: Zierle-Gosh  Orders Received? Or Actions Taken?: awaiting response

## 2021-08-27 NOTE — Progress Notes (Signed)
PROGRESS NOTE    Patient: Natasha Glass                            PCP: Josetta Huddle, MD                    DOB: [birth year]            DOA: 08/26/2021 ZOX:[id number]             DOS: 08/27/2021, 9:30 AM   LOS: 1 day   Date of Service: The patient was seen and examined on 08/27/2021  Subjective:   The patient was seen and examined this morning. Currently hemodynamically stable, afebrile normotensive Denies any headaches chest pain or shortness of breath Complain of generalized weakness especially in her lower legs,    Brief Narrative:   Careen Mauch  is a 80 y.o. female, with history of Atrial fibrillation, DVT, PE, hyperlipidemia, hypertension, lactose intolerance, macular degeneration, peripheral neuropathy, sleep apnea, type 2 diabetes mellitus, and more presents the ED with a chief complaint of generalized weakness.  Patient reports that she suddenly noticed weakness today.  She tried to get up, and her left leg was asleep and she fell onto the floor in her left side.  She reports that there was some pain at that time but it went away spontaneously.  She was not able to get up.  At baseline she ambulates with a walker.  She has complained of dysuria for the last 2 or 3 weeks.  Patient does have a history of chronic UTIs, and just finished a course of antibiotics 2 weeks ago. She is vaccinated for COVID.  Patient is full code.  ED:  Temp 101.9, heart rate 85-104, respiratory rate 17-31, blood pressure 104/58, satting 92% UA is indicative of UTI Physical exam is indicative of cellulitis of the right lower extremity Negative respiratory panel Urine culture and blood culture pending Chest x-ray shows no active disease Lactic acid is 3.2 x 2 White blood cell count 14.3, Chemistry panel unremarkable EKG shows A. fib with a rate of 97 Rocephin 2 g given in the ED,  2 L bolus given in the ED,  LR 150 mils per hour Admission requested for further management of sepsis.   Assessment  & Plan:   Active Problems:   Obesity   Hypertension   History of pulmonary embolism   Acute lower UTI   Generalized weakness   Diabetes mellitus with complication (HCC)   Sepsis ([place])   Cellulitis   Sepsis likely due to UTI and lower extremity cellulitis -Met sepsis criteria on admission but TM 101.9, HR 104, RR 31, WBC 14.3, lactic acid 3.2 -Started on aggressive IV fluid resuscitation and broad-spectrum antibiotics per sepsis protocol -We will follow-up with urine and blood cultures -Continue to follow electrolytes, lactic acid (3.2, 3.2, 1.7)  -Continue to improve, currently hemodynamically stable  Toxic metabolic encephalopathy -Likely due to sepsis -Mentation has improved, back to baseline  Bilateral lower extremity cellulitis, venous stasis -Continue antibiotics as above, will obtain ultrasound to rule out DVT (history of DVT) (on Coumadin, INR-close to therapeutic 1.9   Generalized weakness, falls-left knee pain -Left knee pain, with bilateral lower leg weakness left greater than right -Consulting PT/OT for evaluation recommendations -As needed analgesics -X-ray: Left knee  - Left knee arthroplasty.- Small to moderate suprapatellar knee joint effusion. -CT of the head negative for any acute abnormalities  Diabetes mellitus type  2 -Home medication metformin-on hold -Checking CBG q. ACH S, SSI coverage -Carb modified diabetic diet  Hypertension -Mildly hypotensive on arrival likely due to sepsis, with holding BP meds at this time -Once improved Home medications will be resumed slowly  History of persistent atrial fibrillation -Continue Lopressor, Tikosyn  -Continuing warfarin, with INR goal 2-3, pharmacy consulted  History of DVT/PE -On warfarin   ----------------------------------------------------------------------------------------------------------------------------------------------- Nutritional status:  The patient's BMI is: Body mass index is 49.88  kg/m. I agree with the assessment and plan as outline .Marland Kitchen   ---------------------------------------------------------------------------------------------------------------------------------------------------- Cultures; Blood Cultures x 2 >> Urine Culture  >>> NGT    Antimicrobials: IV Rocephin   Consultants: None   ------------------------------------------------------------------------------------------------------------------------------------------------  DVT prophylaxis:  SCD/Compression stockings and ZH:YQMVHQIO Code Status:   Code Status: Full Code  Family Communication: No family member present at bedside- attempt will be made to update daily The above findings and plan of care has been discussed with patient (and family)  in detail,  they expressed understanding and agreement of above. -Advance care planning has been discussed.   Admission status:   Status is: Inpatient  Remains inpatient appropriate because: Requiring aggressive treatment for sepsis, IV fluid, IV antibiotics, Severe debility, knee pain needing evaluation with PT      Level of care: Telemetry   Procedures:   No admission procedures for hospital encounter.   Antimicrobials:  Anti-infectives (From admission, onward)    Start     Dose/Rate Route Frequency Ordered Stop   08/27/21 1700  cefTRIAXone (ROCEPHIN) 2 g in sodium chloride 0.9 % 100 mL IVPB        2 g 200 mL/hr over 30 Minutes Intravenous Every 24 hours 08/27/21 0246 09/03/21 1659   08/26/21 1845  cefTRIAXone (ROCEPHIN) 1 g in sodium chloride 0.9 % 100 mL IVPB  Status:  Discontinued        1 g 200 mL/hr over 30 Minutes Intravenous  Once 08/26/21 1834 08/26/21 1842   08/26/21 1845  cefTRIAXone (ROCEPHIN) 2 g in sodium chloride 0.9 % 100 mL IVPB        2 g 200 mL/hr over 30 Minutes Intravenous  Once 08/26/21 1842 08/26/21 1936        Medication:   dofetilide  250 mcg Oral BID   insulin aspart  0-15 Units Subcutaneous TID WC    vitamin B-12  500 mcg Oral Daily   warfarin  5 mg Oral q1600   Warfarin - Physician Dosing Inpatient   Does not apply q1600    acetaminophen **OR** acetaminophen, HYDROcodone-acetaminophen, ondansetron **OR** ondansetron (ZOFRAN) IV   Objective:   Vitals:   08/27/21 0618 08/27/21 0700 08/27/21 0715 08/27/21 0829  BP: (!) 156/82 (!) 145/78  (!) 142/75  Pulse: 88 93 92 92  Resp: (!) 26 (!) 26 (!) 21 20  Temp:    98.9 F (37.2 C)  TempSrc:    Oral  SpO2: 94% 96% 95% 94%  Weight:    127.7 kg  Height:        Intake/Output Summary (Last 24 hours) at 08/27/2021 0930 Last data filed at 08/26/2021 2012 Gross per 24 hour  Intake 1000 ml  Output --  Net 1000 ml   Filed Weights   08/26/21 1800 08/27/21 0829  Weight: 128.4 kg 127.7 kg     Examination:   Physical Exam  Constitution:  Alert, cooperative, no distress,  Appears calm and comfortable  Psychiatric: Normal and stable mood and affect, cognition intact,   HEENT: Normocephalic, PERRL,  otherwise with in Normal limits  Chest:Chest symmetric Cardio vascular:  S1/S2, RRR, No murmure, No Rubs or Gallops  pulmonary: Clear to auscultation bilaterally, respirations unlabored, negative wheezes / crackles Abdomen: Soft, non-tender, non-distended, bowel sounds,no masses, no organomegaly Muscular skeletal: Limited exam - in bed, able to move all 4 extremities, reduced strength lower extremities left greater than right due to knee pain  Neuro: CNII-XII intact. , normal motor and sensation, reflexes intact  Extremities: ++3 BL pitting edema lower extremities, +2 pulses ,  Skin: Dry, warm to touch, negative for any Rashes, No open wounds Wounds: per nursing documentation    ------------------------------------------------------------------------------------------------------------------------------------------    LABs:  CBC Latest Ref Rng & Units 08/27/2021 08/26/2021 03/21/2021  WBC 4.0 - 10.5 K/uL 8.6 14.3(H) 4.4  Hemoglobin  12.0 - 15.0 g/dL 11.5(L) 13.4 12.6  Hematocrit 36.0 - 46.0 % 37.1 42.6 41.0  Platelets 150 - 400 K/uL DCLMP 126(L) 95(L)   CMP Latest Ref Rng & Units 08/27/2021 08/26/2021 03/21/2021  Glucose 70 - 99 mg/dL 146(H) 159(H) 98  BUN 8 - 23 mg/dL 20 23 23   Creatinine 0.44 - 1.00 mg/dL 0.61 0.76 0.84  Sodium 135 - 145 mmol/L 138 136 137  Potassium 3.5 - 5.1 mmol/L 3.6 4.3 5.1  Chloride 98 - 111 mmol/L 106 100 103  CO2 22 - 32 mmol/L 26 24 24   Calcium 8.9 - 10.3 mg/dL 8.3(L) 9.2 9.4  Total Protein 6.5 - 8.1 g/dL 6.4(L) 7.7 7.2  Total Bilirubin 0.3 - 1.2 mg/dL 0.6 0.8 0.6  Alkaline Phos 38 - 126 U/L 48 57 64  AST 15 - 41 U/L 32 28 32  ALT 0 - 44 U/L 18 20 28        Micro Results Recent Results (from the past 240 hour(s))  Blood Culture (routine x 2)     Status: None (Preliminary result)   Collection Time: 08/26/21  6:39 PM   Specimen: Right Antecubital; Blood  Result Value Ref Range Status   Specimen Description   Final    RIGHT ANTECUBITAL BOTTLES DRAWN AEROBIC AND ANAEROBIC   Special Requests   Final    Blood Culture results may not be optimal due to an inadequate volume of blood received in culture bottles   Culture   Final    NO GROWTH < 24 HOURS Performed at [facility], [address], [place], [place] [address]    Report Status PENDING  Incomplete  Blood Culture (routine x 2)     Status: None (Preliminary result)   Collection Time: 08/26/21  6:39 PM   Specimen: Left Antecubital; Blood  Result Value Ref Range Status   Specimen Description   Final    LEFT ANTECUBITAL BOTTLES DRAWN AEROBIC AND ANAEROBIC   Special Requests   Final    Blood Culture results may not be optimal due to an inadequate volume of blood received in culture bottles   Culture   Final    NO GROWTH < 24 HOURS Performed at [facility], [address] [place], [place] [address]    Report Status PENDING  Incomplete  Resp Panel by RT-PCR (Flu A&B, Covid) Nasopharyngeal Swab     Status: None   Collection  Time: 08/26/21  6:50 PM   Specimen: Nasopharyngeal Swab; Nasopharyngeal(NP) swabs in vial transport medium  Result Value Ref Range Status   SARS Coronavirus 2 by RT PCR NEGATIVE NEGATIVE Final    Comment: (NOTE) SARS-CoV-2 target nucleic acids are NOT DETECTED.  The SARS-CoV-2 RNA is generally  detectable in upper respiratory specimens during the acute phase of infection. The lowest concentration of SARS-CoV-2 viral copies this assay can detect is 138 copies/mL. A negative result does not preclude SARS-Cov-2 infection and should not be used as the sole basis for treatment or other patient management decisions. A negative result may occur with  improper specimen collection/handling, submission of specimen other than nasopharyngeal swab, presence of viral mutation(s) within the areas targeted by this assay, and inadequate number of viral copies(<138 copies/mL). A negative result must be combined with clinical observations, patient history, and epidemiological information. The expected result is Negative.  Fact Sheet for Patients:  EntrepreneurPulse.com.au  Fact Sheet for Healthcare Providers:  IncredibleEmployment.be  This test is no t yet approved or cleared by the [place] FDA and  has been authorized for detection and/or diagnosis of SARS-CoV-2 by FDA under an Emergency Use Authorization (EUA). This EUA will remain  in effect (meaning this test can be used) for the duration of the COVID-19 declaration under Section 564(b)(1) of the Act, 21 U.S.C.section 360bbb-3(b)(1), unless the authorization is terminated  or revoked sooner.       Influenza A by PCR NEGATIVE NEGATIVE Final   Influenza B by PCR NEGATIVE NEGATIVE Final    Comment: (NOTE) The Xpert Xpress SARS-CoV-2/FLU/RSV plus assay is intended as an aid in the diagnosis of influenza from Nasopharyngeal swab specimens and should not be used as a sole basis for treatment. Nasal washings  and aspirates are unacceptable for Xpert Xpress SARS-CoV-2/FLU/RSV testing.  Fact Sheet for Patients: EntrepreneurPulse.com.au  Fact Sheet for Healthcare Providers: IncredibleEmployment.be  This test is not yet approved or cleared by the [place] FDA and has been authorized for detection and/or diagnosis of SARS-CoV-2 by FDA under an Emergency Use Authorization (EUA). This EUA will remain in effect (meaning this test can be used) for the duration of the COVID-19 declaration under Section 564(b)(1) of the Act, 21 U.S.C. section 360bbb-3(b)(1), unless the authorization is terminated or revoked.  Performed at [facility], [address], [place], [place] [address]     Radiology Reports DG Knee 1-2 Views Left  Result Date: 08/27/2021 CLINICAL DATA:  Knee pain EXAM: LEFT KNEE - 1-2 VIEW COMPARISON:  None. FINDINGS: Left knee arthroplasty. No fracture or dislocation is seen. Small to moderate suprapatellar knee joint effusion. IMPRESSION: Left knee arthroplasty. Small to moderate suprapatellar knee joint effusion. Electronically Signed   By: Julian Hy M.D.   On: 08/27/2021 01:48   CT HEAD WO CONTRAST (5MM)  Result Date: 08/27/2021 CLINICAL DATA:  Weakness, fatigue EXAM: CT HEAD WITHOUT CONTRAST TECHNIQUE: Contiguous axial images were obtained from the base of the skull through the vertex without intravenous contrast. COMPARISON:  None. FINDINGS: Brain: No evidence of acute infarction, hemorrhage, hydrocephalus, extra-axial collection or mass lesion/mass effect. Mild subcortical white matter and periventricular small vessel ischemic changes. Vascular: No hyperdense vessel or unexpected calcification. Skull: Normal. Negative for fracture or focal lesion. Sinuses/Orbits: The visualized paranasal sinuses are essentially clear. The mastoid air cells are unopacified. Other: None. IMPRESSION: No evidence of acute intracranial abnormality. Mild small  vessel ischemic changes. Electronically Signed   By: Julian Hy M.D.   On: 08/27/2021 01:50   DG Chest Port 1 View  Result Date: 08/26/2021 CLINICAL DATA:  Sepsis EXAM: PORTABLE CHEST 1 VIEW COMPARISON:  03/21/2021 FINDINGS: Chronic interstitial thickening is unchanged. Lungs are otherwise clear. No pneumothorax or pleural effusion. Cardiac size within normal limits. Pulmonary vascularity is normal. Osseous structures are age-appropriate.  No acute bone abnormality. IMPRESSION: No active disease. Electronically Signed   By: Fidela Salisbury M.D.   On: 08/26/2021 19:08    SIGNED: Deatra James, MD, FHM. [facility],  Pager (please use [email or web address] to page/text) Please use Epic Secure Chat for non-urgent communication (7AM-7PM)  If 7PM-7AM, please contact night-coverage [email or web address], 08/27/2021, 9:30 AM

## 2021-08-27 NOTE — TOC Initial Note (Signed)
Transition of Care [facility]) - Initial/Assessment Note    Patient Details  Name: Natasha Glass MRN: [id number] Date of Birth: [birth year]  Transition of Care [facility]) CM/SW Contact:    Salome Arnt, [place] Phone Number: 08/27/2021, 3:54 PM  Clinical Narrative:  Pt admitted due to sepsis secondary to UTI versus cellulitis. Pt reports she lives alone. Her daughter lives nearby and assists as needed. Pt drives herself to appointments. PT evaluated pt and recommend SNF. Pt is not interested in SNF and requests home health. Referred and accepted by Tommi Rumps with [facility] for HHPT, OT, and RN. MD notified for orders. TOC will continue to follow.                  Expected Discharge Plan: [place] Barriers to Discharge: Continued Medical Work up   Patient Goals and CMS Choice Patient states their goals for this hospitalization and ongoing recovery are:: return home   Choice offered to / list presented to : Patient  Expected Discharge Plan and Services Expected Discharge Plan: [place] In-house Referral: Clinical Social Work   Post Acute Care Choice: [place] arrangements for the past 2 months: [place]                 DME Arranged: N/A         HH Arranged: RN, PT, OT HH Agency: [place] Date [place]: 08/27/21 Time [place]: 1552 Representative spoke with at [place]: Tommi Rumps  Prior Living Arrangements/Services Living arrangements for the past 2 months: [place] with:: [place] Patient language and need for interpreter reviewed:: Yes Do you feel safe going back to the place where you live?: Yes          Current home services: DME (cane, walker) Criminal Activity/Legal Involvement Pertinent to Current Situation/Hospitalization: No - Comment as needed  Activities of Daily Living      Permission Sought/Granted                  Emotional Assessment    Attitude/Demeanor/Rapport: Engaged Affect (typically observed): Appropriate Orientation: : Oriented to [place], Oriented to Place, Oriented to  Time, Oriented to Situation Alcohol / Substance Use: Not Applicable Psych Involvement: No (comment)  Admission diagnosis:  Pain [R52] Acute cystitis with hematuria [N30.01] Cellulitis of right lower extremity [L03.115] Sepsis ([place]) [A41.9] Sepsis without acute organ dysfunction, due to unspecified organism [facility]) [A41.9] Patient Active Problem List   Diagnosis Date Noted   Cellulitis 08/27/2021   Sepsis ([employer]) 08/26/2021   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/2019   Acquired thrombophilia ([place]) 09/17/2019   Pain in joint of right shoulder 09/22/2018   Persistent atrial fibrillation ([place]) 08/18/2018   PAF (paroxysmal atrial fibrillation) ([place])    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 [place]  [address] Phone: [phone] Fax: [phone]  [facility] - [place], [place] [facility] [address] [place] [address] [place] [address] Phone: [phone] Fax: [phone]     Social Determinants of Health (SDOH) Interventions    Readmission Risk Interventions No flowsheet data  found.

## 2021-08-27 NOTE — Evaluation (Addendum)
Physical Therapy Evaluation Patient Details Name: [name] MRN: [id number] DOB: [birth year] Today's Date: 08/27/2021  History of Present Illness  Natasha Glass  is a 80 y.o. female, with history of atrial fibrillation, DVT, PE, hyperlipidemia, hypertension, lactose intolerance, macular degeneration, peripheral neuropathy, sleep apnea, type 2 diabetes mellitus, and more presents the ED with a chief complaint of generalized weakness.  Patient reports that she suddenly noticed weakness today.  She tried to get up, and her left leg was asleep and she fell onto the floor in her left side.  She reports that there was some pain at that time but it went away spontaneously.  She was not able to get up.  At baseline she is not able to get up if she is not completely on the floor.  When she is found herself on the floor previously she would scoot over to the front steps, where she could put her legs down underneath her and then stand up.  She reports she did not even try that today because she was too weak.  At baseline she ambulates with a walker, and she did have a walker close to her, but she could not reach it.  She reports she normally stands up and then grabs a walker.  Patient denies hitting her head or losing consciousness.  Patient had no preceding factors to her fall, including no chest pain, palpitations, dizziness, dyspnea.  Patient reports that she just felt like her leg was weak and slid down to the floor.  She does feel like her knee catches now.  She reports she is unable to bend it past 45 degrees at this time.  Not because of pain, because the knee will not bend.  We will get an x-ray of the left knee.  Patient thinks it her symptoms came on suddenly, but per her own story she was not behaving as her normal [place] this morning.  She normally gets up and eats breakfast early and then goes to church.  She did not get up, did not feel hungry, slept through church.  This is abnormal for her.  Son at  bedside says he saw her yesterday and she was ambulating with just her cane, and doing fine.  She has complained of dysuria for the last 2 or 3 weeks.  Patient does have a history of chronic UTIs, and just finished a course of antibiotics 2 weeks ago.  She has not noticed any fevers at home.  She has not noticed if her right leg has been gradually becoming more erythematous or not.  She reports that her legs are always erythematous, but is visibly more erythematous than the left and she is not aware of that.   Clinical Impression  Patient presents in bed awake, alert, and agreeable for therapy w/ son and pastor at bedside w/ B LE swelling. Patient required Mod A for transitioning from supine to sitting at EOB, demonstrated slow labored movement d/t generalized weakness, used bed rails for support, and c/o pain in L LE w/ knee flexion d/t recent fall. Patient scooted on the bed towards the chair, required increased time. Patient required multiple attempts to perform a sit to stand transfer using a RW, d/t generalized weakness and pain in L LE, required Mod A to complete transfer. Patient performed a step pivot transfer from bed to chair w/ Mod assist d/t generalized weakness, pain in L LE, and fatigue. Patient tolerated sitting up in chair after therapy w/ son at bedside. Patient  will benefit from continued skilled physical therapy in hospital and recommended venue below to increase strength, balance, endurance for safe ADLs and gait.          Recommendations for follow up therapy are one component of a multi-disciplinary discharge planning process, led by the attending physician.  Recommendations may be updated based on patient status, additional functional criteria and insurance authorization.  Follow Up Recommendations Skilled nursing-short term rehab (<3 hours/day)    Assistance Recommended at Discharge Frequent or constant Supervision/Assistance  Functional Status Assessment Patient has had a  recent decline in their functional status and demonstrates the ability to make significant improvements in function in a reasonable and predictable amount of time.  Equipment Recommendations  None recommended by PT    Recommendations for Other Services       Precautions / Restrictions Precautions Precautions: Fall Restrictions Weight Bearing Restrictions: No      Mobility  Bed Mobility Overal bed mobility: Needs Assistance Bed Mobility: Supine to Sit     Supine to sit: Mod assist     General bed mobility comments: Required support of legs for transitioning from supine w/ HOB elevated to sitting at EOB. C/o pain in L LE.    Transfers Overall transfer level: Needs assistance Equipment used: Rolling walker (2 wheels) Transfers: Sit to/from Stand;Bed to chair/wheelchair/BSC Sit to Stand: Mod assist   Step pivot transfers: Mod assist       General transfer comment: Slow labored movement, c/o L LE pain.    Ambulation/Gait                  Stairs            Wheelchair Mobility    Modified Rankin (Stroke Patients Only)       Balance Overall balance assessment: Needs assistance Sitting-balance support: Bilateral upper extremity supported;Feet supported Sitting balance-Leahy Scale: Fair Sitting balance - Comments: fair/good sitting at EOB   Standing balance support: Bilateral upper extremity supported;During functional activity Standing balance-Leahy Scale: Fair Standing balance comment: fair using RW                             Pertinent Vitals/Pain Pain Assessment: 0-10 Pain Score: 5  Pain Location: L LE Pain Descriptors / Indicators: Grimacing;Sore Pain Intervention(s): Limited activity within patient's tolerance;Monitored during session    [place] expects to be discharged to:: Private residence Living Arrangements: Alone Available Help at Discharge: Family Type of Home: House Home Access: Stairs to  enter Entrance Stairs-Rails: None Entrance Stairs-Number of Steps: 1   Home Layout: One level Home Equipment: Rollator (4 wheels);Grab bars - tub/shower;Shower seat - built in;BSC/3in1;Cane - quad;Cane - single point      Prior Function Prior Level of Function : Driving;Independent/Modified Independent             Mobility Comments: Modified independent household and community ambulator, drives. Patient reports using a quad cane and SPC to ambulate occasionally at church and to ambulate to her mailbox. Patient reports using a rollator most of the time and sleeping in her lift chair at night. ADLs Comments: Patient reports independence w/ ADL's.     Hand Dominance        Extremity/Trunk Assessment   Upper Extremity Assessment Upper Extremity Assessment: Overall WFL for tasks assessed    Lower Extremity Assessment Lower Extremity Assessment: Generalized weakness       Communication   Communication: No difficulties  Cognition  Arousal/Alertness: Awake/alert Behavior During Therapy: WFL for tasks assessed/performed Overall Cognitive Status: Within Functional Limits for tasks assessed                                          General Comments      Exercises     Assessment/Plan    PT Assessment Patient needs continued PT services  PT Problem List Decreased strength;Decreased mobility;Decreased safety awareness;Decreased range of motion;Decreased coordination;Decreased activity tolerance;Decreased balance;Decreased knowledge of use of DME       PT Treatment Interventions DME instruction;Therapeutic exercise;Gait training;Balance training;Stair training;Functional mobility training;Therapeutic activities;Patient/family education    PT Goals (Current goals can be found in the Care Plan section)  Acute Rehab PT Goals Patient Stated Goal: Return home after rehab. PT Goal Formulation: With patient/family Time For Goal Achievement: 09/10/21 Potential to  Achieve Goals: Fair    Frequency Min 3X/week   Barriers to discharge        Co-evaluation               AM-PAC PT "6 Clicks" Mobility  Outcome Measure Help needed turning from your back to your side while in a flat bed without using bedrails?: A Lot Help needed moving from lying on your back to sitting on the side of a flat bed without using bedrails?: A Lot Help needed moving to and from a bed to a chair (including a wheelchair)?: A Lot Help needed standing up from a chair using your arms (e.g., wheelchair or bedside chair)?: A Lot Help needed to walk in hospital room?: A Lot Help needed climbing 3-5 steps with a railing? : Total 6 Click Score: 11    End of Session   Activity Tolerance: Patient tolerated treatment well;Patient limited by fatigue;Patient limited by pain Patient left: in chair;with call bell/phone within reach;with family/visitor present Nurse Communication: Mobility status PT Visit Diagnosis: Unsteadiness on feet (R26.81);Other abnormalities of gait and mobility (R26.89);Muscle weakness (generalized) (M62.81)    Time: 4782-9562 PT Time Calculation (min) (ACUTE ONLY): 30 min   Charges:   PT Evaluation $PT Eval Moderate Complexity: 1 Mod PT Treatments $Therapeutic Activity: 23-37 mins        Cassie Jones, SPT  During this treatment session, the therapist was present, participating in and directing the treatment.  3:31 PM, 08/27/21 Lonell Grandchild, MPT Physical Therapist with [facility] [phone] office [phone] mobile phone

## 2021-08-27 NOTE — Plan of Care (Addendum)
  Problem: Acute Rehab PT Goals(only PT should resolve) Goal: Pt Will Go Supine/Side To Sit Outcome: Progressing Flowsheets (Taken 08/27/2021 1459) Pt will go Supine/Side to Sit: with minimal assist Goal: Patient Will Transfer Sit To/From Stand Outcome: Progressing Flowsheets (Taken 08/27/2021 1459) Patient will transfer sit to/from stand: with minimal assist Goal: Pt Will Transfer Bed To Chair/Chair To Bed Outcome: Progressing Flowsheets (Taken 08/27/2021 1459) Pt will Transfer Bed to Chair/Chair to Bed: with min assist Goal: Pt Will Ambulate Outcome: Progressing Flowsheets (Taken 08/27/2021 1459) Pt will Ambulate:  10 feet  with moderate assist  with rolling walker   Cassie Jones, SPT  During this treatment session, the therapist was present, participating in and directing the treatment.  3:33 PM, 08/27/21 Lonell Grandchild, MPT Physical Therapist with [facility] 336 [phone] office [phone] mobile phone

## 2021-08-27 NOTE — Progress Notes (Signed)
   08/27/21 1128  Assess: MEWS Score  BP 112/78  Pulse Rate (!) 133  ECG Heart Rate (!) 134  Resp 20  Level of Consciousness Alert  SpO2 95 %  O2 Device Room Air  Assess: MEWS Score  MEWS Temp 0  MEWS Systolic 0  MEWS Pulse 3  MEWS RR 0  MEWS LOC 0  MEWS Score 3  MEWS Score Color Yellow  Assess: if the MEWS score is Yellow or Red  Were vital signs taken at a resting state? Yes  Treat  Pain Scale 0-10  Pain Score 7  Pain Type Acute pain  Pain Location Head  Take Vital Signs  Increase Vital Sign Frequency  Yellow: Q 2hr X 2 then Q 4hr X 2, if remains yellow, continue Q 4hrs  Notify: Charge Nurse/RN  Name of Charge Nurse/RN Notified Juanell Fairly, RN  Date Charge Nurse/RN Notified 08/27/21  Time Charge Nurse/RN Notified 1135  Notify: Provider  Provider Name/Title Shahmedhi  Date Provider Notified 08/27/21  Time Provider Notified 1140  Notification Type Page    Pt up to chair with PT, central tele called to notify that pt's heart rate up in 130's afib. Pt's other VSS, pt c/o continued headache and left knee pain, pt medicated with ordered pain med. Pt with no SOB or chest pain. When advised that her HR is elevated, pt states, "Well, I took my ticlosyn a little while a ago, so maybe it'll kick in soon." Son at bedside. Awaiting response from MD regarding heart rate.

## 2021-08-27 NOTE — Progress Notes (Signed)
EKG obtained and paper copy placed in chart.

## 2021-08-27 NOTE — Progress Notes (Signed)
   08/27/21 1800  Assess: MEWS Score  BP (!) 144/90  Pulse Rate (!) 116  ECG Heart Rate (!) 118  Resp 20  Level of Consciousness Alert  SpO2 96 %  O2 Device Room Air  Assess: MEWS Score  MEWS Temp 0  MEWS Systolic 0  MEWS Pulse 2  MEWS RR 0  MEWS LOC 0  MEWS Score 2  MEWS Score Color Yellow  Assess: if the MEWS score is Yellow or Red  Were vital signs taken at a resting state? Yes  Treat  Pain Scale 0-10  Pain Score 2  Pain Type Acute pain  Pain Location Leg  Pain Orientation Left  Take Vital Signs  Increase Vital Sign Frequency  Yellow: Q 2hr X 2 then Q 4hr X 2, if remains yellow, continue Q 4hrs  Notify: Charge Nurse/RN  Name of Charge Nurse/RN Notified mary ann howerton, rn  Date Charge Nurse/RN Notified 08/27/21  Time Charge Nurse/RN Notified 4327   Pt resting quietly in bed, son at bedside. Pt with heart rate increased to 110-120/min, other VSS, pt denies SOB or chest pain. Cardizem 43m oral given per PRN order for HR > 100/min.

## 2021-08-28 ENCOUNTER — Inpatient Hospital Stay (HOSPITAL_COMMUNITY): Payer: Medicare Other

## 2021-08-28 DIAGNOSIS — I4819 Other persistent atrial fibrillation: Secondary | ICD-10-CM

## 2021-08-28 LAB — GLUCOSE, CAPILLARY
Glucose-Capillary: 121 mg/dL — ABNORMAL HIGH (ref 70–99)
Glucose-Capillary: 146 mg/dL — ABNORMAL HIGH (ref 70–99)
Glucose-Capillary: 147 mg/dL — ABNORMAL HIGH (ref 70–99)
Glucose-Capillary: 93 mg/dL (ref 70–99)
Glucose-Capillary: 145 mg/dL — ABNORMAL HIGH (ref 70–99)

## 2021-08-28 LAB — BLOOD CULTURE ID PANEL (REFLEXED) - BCID2

## 2021-08-28 LAB — CBC
HCT: 35.8 % — ABNORMAL LOW (ref 36.0–46.0)
Hemoglobin: 11.2 g/dL — ABNORMAL LOW (ref 12.0–15.0)
MCH: 29.1 pg (ref 26.0–34.0)
MCHC: 31.3 g/dL (ref 30.0–36.0)
MCV: 93 fL (ref 80.0–100.0)
Platelets: 104 10*3/uL — ABNORMAL LOW (ref 150–400)
RBC: 3.85 MIL/uL — ABNORMAL LOW (ref 3.87–5.11)
RDW: 17.5 % — ABNORMAL HIGH (ref 11.5–15.5)
WBC: 6.7 10*3/uL (ref 4.0–10.5)
nRBC: 0 % (ref 0.0–0.2)

## 2021-08-28 LAB — URINE CULTURE: Culture: <10000 — AB

## 2021-08-28 LAB — BASIC METABOLIC PANEL
Anion gap: 4 — ABNORMAL LOW (ref 5–15)
BUN: 14 mg/dL (ref 8–23)
CO2: 27 mmol/L (ref 22–32)
Calcium: 8 mg/dL — ABNORMAL LOW (ref 8.9–10.3)
Chloride: 105 mmol/L (ref 98–111)
Creatinine, Ser: 0.53 mg/dL (ref 0.44–1.00)
GFR, Estimated: >60 mL/min (ref >60)
Glucose, Bld: 124 mg/dL — ABNORMAL HIGH (ref 70–99)
Potassium: 3.7 mmol/L (ref 3.5–5.1)
Sodium: 136 mmol/L (ref 135–145)

## 2021-08-28 LAB — PROTIME-INR
INR: 2 — ABNORMAL HIGH (ref 0.8–1.2)
Prothrombin Time: 22.8 seconds — ABNORMAL HIGH (ref 11.4–15.2)

## 2021-08-28 MED ORDER — POTASSIUM CHLORIDE CRYS ER 20 MEQ PO TBCR
40.0000 meq | EXTENDED_RELEASE_TABLET | Freq: Every day | ORAL | Status: DC
  Administered 2021-08-29 – 2021-08-30 (×2): 40 meq via ORAL
  Filled 2021-08-28 (×2): qty 2

## 2021-08-28 MED ORDER — MAGNESIUM OXIDE -MG SUPPLEMENT 400 (240 MG) MG PO TABS
800.0000 mg | ORAL_TABLET | Freq: Every day | ORAL | Status: DC
  Administered 2021-08-29 – 2021-08-30 (×2): 800 mg via ORAL
  Filled 2021-08-28 (×2): qty 2

## 2021-08-28 NOTE — Progress Notes (Signed)
PROGRESS NOTE    Patient: Natasha Glass                            PCP: Josetta Huddle, MD                    DOB: [birth year]            DOA: 08/26/2021 JOI:[id number]             DOS: 08/28/2021, 3:49 PM   LOS: 2 days   Date of Service: The patient was seen and examined on 08/28/2021  Subjective:   The patient was seen and examined this morning, still in A. fib, improved shortness of breath, denies any chest pain..  Denies any fever chills. Complain generalized weakness, lower extremity edema   Brief Narrative:   Natasha Glass  is a 80 y.o. female, with history of Atrial fibrillation, DVT, PE, hyperlipidemia, hypertension, lactose intolerance, macular degeneration, peripheral neuropathy, sleep apnea, type 2 diabetes mellitus, and more presents the ED with a chief complaint of generalized weakness.  Patient reports that she suddenly noticed weakness today.  She tried to get up, and her left leg was asleep and she fell onto the floor in her left side.  She reports that there was some pain at that time but it went away spontaneously.  She was not able to get up.  At baseline she ambulates with a walker.  She has complained of dysuria for the last 2 or 3 weeks.  Patient does have a history of chronic UTIs, and just finished a course of antibiotics 2 weeks ago. She is vaccinated for COVID.  Patient is full code.  ED:  Temp 101.9, heart rate 85-104, respiratory rate 17-31, blood pressure 104/58, satting 92% UA is indicative of UTI Physical exam is indicative of cellulitis of the right lower extremity Negative respiratory panel Urine culture and blood culture pending Chest x-ray shows no active disease Lactic acid is 3.2 x 2 White blood cell count 14.3, Chemistry panel unremarkable EKG shows A. fib with a rate of 97 Rocephin 2 g given in the ED,  2 L bolus given in the ED,  LR 150 mils per hour Admission requested for further management of sepsis.   Assessment & Plan:   Active  Problems:   Obesity   Hypertension   History of pulmonary embolism   Acute lower UTI   Generalized weakness   Diabetes mellitus with complication (HCC)   Sepsis ([place])   Cellulitis   Sepsis likely due to UTI and lower extremity cellulitis Improved sepsis physiology today, afebrile, mildly hypotensive, satting 97% on room air  -Admission met sepsis criteria on admission but TM 101.9, HR 104, RR 31, WBC 14.3, lactic acid 3.2 -Started on aggressive IV fluid resuscitation and broad-spectrum antibiotics per sepsis protocol -We will follow-up with urine and blood cultures -Continue to follow electrolytes, lactic acid (3.2, 3.2, 1.7)  -Continue to improve, currently hemodynamically stable  Toxic metabolic encephalopathy -Likely due to sepsis -Much improved back to baseline  Bilateral lower extremity cellulitis, venous stasis -Continue antibiotics as above,  (history of DVT) (on Coumadin, INR-close to therapeutic 1.9 >> 2.0 Bilateral lower extremity Doppler negative for any acute DVTs    Generalized weakness, falls-left knee pain -Left knee pain, with bilateral lower leg weakness left greater than right - PT/OT for evaluation recommendations -As needed analgesics -X-ray: Left knee  - Left knee arthroplasty.- Small to  moderate suprapatellar knee joint effusion. -CT of the head negative for any acute abnormalities   Diabetes mellitus type 2 -Home medication metformin-on hold -Checking CBG q. ACH S, SSI coverage -Carb modified diabetic diet  Hypertension -Blood pressure remained soft --hypotensive on arrival likely due to sepsis,  -Holding BP meds -Once improved Home medications will be resumed slowly  History of persistent atrial fibrillation -Continue Lopressor, Tikosyn  -Continuing warfarin, with INR goal 2-3, pharmacy consulted A. fib, rate is more controlled, cardiology consulted following appreciate input  History of DVT/PE/ Afib  -On  warfarin   ----------------------------------------------------------------------------------------------------------------------------------------------- Nutritional status:  The patient's BMI is: Body mass index is 49.88 kg/m. I agree with the assessment and plan as outline .Marland Kitchen   ---------------------------------------------------------------------------------------------------------------------------------------------------- Cultures; Blood Cultures x 2 >> Urine Culture  >>> NGT    Antimicrobials: IV Rocephin   Consultants: None   ------------------------------------------------------------------------------------------------------------------------------------------------  DVT prophylaxis:  SCD/Compression stockings and DQ:QIWLNLGX Code Status:   Code Status: Full Code  Family Communication: No family member present at bedside- attempt will be made to update daily The above findings and plan of care has been discussed with patient (and family)  in detail,  they expressed understanding and agreement of above. -Advance care planning has been discussed.   Admission status:   Status is: Inpatient  Remains inpatient appropriate because: Requiring aggressive treatment for sepsis, IV fluid, IV antibiotics, Severe debility, knee pain needing evaluation with PT      Level of care: Telemetry   Procedures:   No admission procedures for hospital encounter.   Antimicrobials:  Anti-infectives (From admission, onward)    Start     Dose/Rate Route Frequency Ordered Stop   08/27/21 1700  cefTRIAXone (ROCEPHIN) 2 g in sodium chloride 0.9 % 100 mL IVPB        2 g 200 mL/hr over 30 Minutes Intravenous Every 24 hours 08/27/21 0246 09/03/21 1659   08/26/21 1845  cefTRIAXone (ROCEPHIN) 1 g in sodium chloride 0.9 % 100 mL IVPB  Status:  Discontinued        1 g 200 mL/hr over 30 Minutes Intravenous  Once 08/26/21 1834 08/26/21 1842   08/26/21 1845  cefTRIAXone (ROCEPHIN) 2 g in  sodium chloride 0.9 % 100 mL IVPB        2 g 200 mL/hr over 30 Minutes Intravenous  Once 08/26/21 1842 08/26/21 1936        Medication:   dofetilide  250 mcg Oral BID   insulin aspart  0-15 Units Subcutaneous TID WC   [START ON 08/29/2021] magnesium oxide  800 mg Oral Daily   metoprolol tartrate  50 mg Oral BID   nystatin ointment   Topical BID   [START ON 08/29/2021] potassium chloride  40 mEq Oral Daily   vitamin B-12  500 mcg Oral Daily   warfarin  5 mg Oral q1600   Warfarin - Physician Dosing Inpatient   Does not apply q1600    acetaminophen **OR** acetaminophen, diltiazem, HYDROcodone-acetaminophen, ondansetron **OR** ondansetron (ZOFRAN) IV   Objective:   Vitals:   08/27/21 2152 08/28/21 0231 08/28/21 0556 08/28/21 1325  BP: 117/72 121/88 110/72 103/67  Pulse: 82 (!) 105 98 84  Resp: 20 14 18 17   Temp: 98.2 F (36.8 C) 98.3 F (36.8 C) 97.6 F (36.4 C) 98.4 F (36.9 C)  TempSrc: Oral Oral  Oral  SpO2: 96% 97% 96% 97%  Weight:      Height:        Intake/Output Summary (Last 24  hours) at 08/28/2021 1549 Last data filed at 08/28/2021 0900 Gross per 24 hour  Intake 2009.5 ml  Output 800 ml  Net 1209.5 ml   Filed Weights   08/26/21 1800 08/27/21 0829  Weight: 128.4 kg 127.7 kg     Examination:      Physical Exam:   General:  Alert, oriented, cooperative, no distress;   HEENT:  Normocephalic, PERRL, otherwise with in Normal limits   Neuro:  CNII-XII intact. , normal motor and sensation, reflexes intact   Lungs:   Clear to auscultation BL, Respirations unlabored, no wheezes / crackles  Cardio:    S1/S2, RRR, No murmure, No Rubs or Gallops   Abdomen:   Soft, non-tender, bowel sounds active all four quadrants,  no guarding or peritoneal signs.  Muscular skeletal:  Limited exam - in bed, able to move all 4 extremities, Normal strength,  2+ pulses,  symmetric, +++3 BL pitting edema  Skin:  Dry, warm to touch, negative for any Rashes,  Wounds: Please  see nursing documentation          ------------------------------------------------------------------------------------------------------------------------------------------    LABs:  CBC Latest Ref Rng & Units 08/28/2021 08/27/2021 08/26/2021  WBC 4.0 - 10.5 K/uL 6.7 8.6 14.3(H)  Hemoglobin 12.0 - 15.0 g/dL 11.2(L) 11.5(L) 13.4  Hematocrit 36.0 - 46.0 % 35.8(L) 37.1 42.6  Platelets 150 - 400 K/uL 104(L) DCLMP 126(L)   CMP Latest Ref Rng & Units 08/28/2021 08/27/2021 08/26/2021  Glucose 70 - 99 mg/dL 124(H) 146(H) 159(H)  BUN 8 - 23 mg/dL 14 20 23   Creatinine 0.44 - 1.00 mg/dL 0.53 0.61 0.76  Sodium 135 - 145 mmol/L 136 138 136  Potassium 3.5 - 5.1 mmol/L 3.7 3.6 4.3  Chloride 98 - 111 mmol/L 105 106 100  CO2 22 - 32 mmol/L 27 26 24   Calcium 8.9 - 10.3 mg/dL 8.0(L) 8.3(L) 9.2  Total Protein 6.5 - 8.1 g/dL - 6.4(L) 7.7  Total Bilirubin 0.3 - 1.2 mg/dL - 0.6 0.8  Alkaline Phos 38 - 126 U/L - 48 57  AST 15 - 41 U/L - 32 28  ALT 0 - 44 U/L - 18 20       Micro Results Recent Results (from the past 240 hour(s))  Blood Culture (routine x 2)     Status: None (Preliminary result)   Collection Time: 08/26/21  6:39 PM   Specimen: Right Antecubital; Blood  Result Value Ref Range Status   Specimen Description   Final    RIGHT ANTECUBITAL BOTTLES DRAWN AEROBIC AND ANAEROBIC Performed at [facility], [address] [place], [place] [address]    Special Requests   Final    Blood Culture results may not be optimal due to an inadequate volume of blood received in culture bottles Performed at [facility], [address], [place], [place] [address]    Culture  Setup Time   Final    GRAM POSITIVE COCCI IN CLUSTERS Gram Stain Report Called to,Read Back By and Verified With: CRYSTAL REYNOLD @ 1908 ON 08/27/21 C VARNER IN BOTH AEROBIC AND ANAEROBIC BOTTLES Organism ID to follow CRITICAL RESULT CALLED TO, READ BACK BY AND VERIFIED WITH: PIA PARRIS RN 08/28/2021 @0109  BY JW    Culture    Final    GRAM POSITIVE COCCI TOO YOUNG TO READ Performed at [facility], [place] [address] [place],  [address]    Report Status PENDING  Incomplete  Blood Culture (routine x 2)     Status: None (  Preliminary result)   Collection Time: 08/26/21  6:39 PM   Specimen: Left Antecubital; Blood  Result Value Ref Range Status   Specimen Description   Final    LEFT ANTECUBITAL BOTTLES DRAWN AEROBIC AND ANAEROBIC   Special Requests   Final    Blood Culture results may not be optimal due to an inadequate volume of blood received in culture bottles   Culture   Final    NO GROWTH 2 DAYS Performed at [facility], [address] [place], [place] [address]    Report Status PENDING  Incomplete  Blood Culture ID Panel (Reflexed)     Status: Abnormal   Collection Time: 08/26/21  6:39 PM  Result Value Ref Range Status   Enterococcus faecalis NOT DETECTED NOT DETECTED Final   Enterococcus Faecium NOT DETECTED NOT DETECTED Final   Listeria monocytogenes NOT DETECTED NOT DETECTED Final   Staphylococcus species DETECTED (A) NOT DETECTED Final    Comment: CRITICAL RESULT CALLED TO, READ BACK BY AND VERIFIED WITH: PIA PARRIS RN 08/28/2021 @0109  BY JW    Staphylococcus aureus (BCID) NOT DETECTED NOT DETECTED Final   Staphylococcus epidermidis NOT DETECTED NOT DETECTED Final   Staphylococcus lugdunensis NOT DETECTED NOT DETECTED Final   Streptococcus species NOT DETECTED NOT DETECTED Final   Streptococcus agalactiae NOT DETECTED NOT DETECTED Final   Streptococcus pneumoniae NOT DETECTED NOT DETECTED Final   Streptococcus pyogenes NOT DETECTED NOT DETECTED Final   A.calcoaceticus-baumannii NOT DETECTED NOT DETECTED Final   Bacteroides fragilis NOT DETECTED NOT DETECTED Final   Enterobacterales NOT DETECTED NOT DETECTED Final   Enterobacter cloacae complex NOT DETECTED NOT DETECTED Final   Escherichia coli NOT DETECTED NOT DETECTED Final   Klebsiella aerogenes NOT DETECTED NOT DETECTED Final    Klebsiella oxytoca NOT DETECTED NOT DETECTED Final   Klebsiella pneumoniae NOT DETECTED NOT DETECTED Final   Proteus species NOT DETECTED NOT DETECTED Final   Salmonella species NOT DETECTED NOT DETECTED Final   Serratia marcescens NOT DETECTED NOT DETECTED Final   Haemophilus influenzae NOT DETECTED NOT DETECTED Final   Neisseria meningitidis NOT DETECTED NOT DETECTED Final   Pseudomonas aeruginosa NOT DETECTED NOT DETECTED Final   Stenotrophomonas maltophilia NOT DETECTED NOT DETECTED Final   Candida albicans NOT DETECTED NOT DETECTED Final   Candida auris NOT DETECTED NOT DETECTED Final   Candida glabrata NOT DETECTED NOT DETECTED Final   Candida krusei NOT DETECTED NOT DETECTED Final   Candida parapsilosis NOT DETECTED NOT DETECTED Final   Candida tropicalis NOT DETECTED NOT DETECTED Final   Cryptococcus neoformans/gattii NOT DETECTED NOT DETECTED Final    Comment: Performed at [facility], [address] [place], [place] [address]  Resp Panel by RT-PCR (Flu A&B, Covid) Nasopharyngeal Swab     Status: None   Collection Time: 08/26/21  6:50 PM   Specimen: Nasopharyngeal Swab; Nasopharyngeal(NP) swabs in vial transport medium  Result Value Ref Range Status   SARS Coronavirus 2 by RT PCR NEGATIVE NEGATIVE Final    Comment: (NOTE) SARS-CoV-2 target nucleic acids are NOT DETECTED.  The SARS-CoV-2 RNA is generally detectable in upper respiratory specimens during the acute phase of infection. The lowest concentration of SARS-CoV-2 viral copies this assay can detect is 138 copies/mL. A negative result does not preclude SARS-Cov-2 infection and should not be used as the sole basis for treatment or other patient management decisions. A negative result may occur with  improper specimen collection/handling, submission of specimen other than nasopharyngeal swab, presence of  viral mutation(s) within the areas targeted by this assay, and inadequate number of viral copies(<138  copies/mL). A negative result must be combined with clinical observations, patient history, and epidemiological information. The expected result is Negative.  Fact Sheet for Patients:  EntrepreneurPulse.com.au  Fact Sheet for Healthcare Providers:  IncredibleEmployment.be  This test is no t yet approved or cleared by the [place] FDA and  has been authorized for detection and/or diagnosis of SARS-CoV-2 by FDA under an Emergency Use Authorization (EUA). This EUA will remain  in effect (meaning this test can be used) for the duration of the COVID-19 declaration under Section 564(b)(1) of the Act, 21 U.S.C.section 360bbb-3(b)(1), unless the authorization is terminated  or revoked sooner.       Influenza A by PCR NEGATIVE NEGATIVE Final   Influenza B by PCR NEGATIVE NEGATIVE Final    Comment: (NOTE) The Xpert Xpress SARS-CoV-2/FLU/RSV plus assay is intended as an aid in the diagnosis of influenza from Nasopharyngeal swab specimens and should not be used as a sole basis for treatment. Nasal washings and aspirates are unacceptable for Xpert Xpress SARS-CoV-2/FLU/RSV testing.  Fact Sheet for Patients: EntrepreneurPulse.com.au  Fact Sheet for Healthcare Providers: IncredibleEmployment.be  This test is not yet approved or cleared by the [place] FDA and has been authorized for detection and/or diagnosis of SARS-CoV-2 by FDA under an Emergency Use Authorization (EUA). This EUA will remain in effect (meaning this test can be used) for the duration of the COVID-19 declaration under Section 564(b)(1) of the Act, 21 U.S.C. section 360bbb-3(b)(1), unless the authorization is terminated or revoked.  Performed at  [facility], [address], [place], [place] [address]   Urine Culture     Status: Abnormal   Collection Time: 08/26/21  9:05 PM   Specimen: Urine, Clean Catch  Result Value Ref Range Status    Specimen Description   Final    URINE, CLEAN CATCH Performed at [facility], [address], [place], [place] [address]    Special Requests   Final    NONE Performed at [facility], [address], [place], [place] [address]    Culture (A)  Final    <10,000 COLONIES/mL INSIGNIFICANT GROWTH Performed at [facility], [place] [address] [place], [place] [address]    Report Status 08/28/2021 FINAL  Final  Culture, blood (routine x 2)     Status: None (Preliminary result)   Collection Time: 08/27/21  8:50 PM   Specimen: Left Antecubital; Blood  Result Value Ref Range Status   Specimen Description LEFT ANTECUBITAL  Final   Special Requests   Final    Blood Culture adequate volume BOTTLES DRAWN AEROBIC AND ANAEROBIC   Culture   Final    NO GROWTH < 12 HOURS Performed at [facility], [address] [place], [place] [address]    Report Status PENDING  Incomplete  Culture, blood (routine x 2)     Status: None (Preliminary result)   Collection Time: 08/27/21  8:53 PM   Specimen: BLOOD RIGHT HAND  Result Value Ref Range Status   Specimen Description BLOOD RIGHT HAND  Final   Special Requests   Final    Blood Culture adequate volume BOTTLES DRAWN AEROBIC AND ANAEROBIC   Culture   Final    NO GROWTH < 12 HOURS Performed at [facility], [address] [place], [place] [address]    Report Status PENDING  Incomplete    Radiology Reports DG Knee 1-2 Views Left  Result  Date: 08/27/2021 CLINICAL DATA:  Knee pain EXAM: LEFT KNEE - 1-2 VIEW COMPARISON:  None. FINDINGS: Left knee arthroplasty. No fracture or dislocation is seen. Small to moderate suprapatellar knee joint effusion. IMPRESSION: Left knee arthroplasty. Small to moderate suprapatellar knee joint effusion. Electronically Signed   By: Julian Hy M.D.   On: 08/27/2021 01:48   CT HEAD WO CONTRAST (5MM)  Result Date: 08/27/2021 CLINICAL DATA:  Weakness, fatigue EXAM: CT HEAD WITHOUT CONTRAST TECHNIQUE: Contiguous axial  images were obtained from the base of the skull through the vertex without intravenous contrast. COMPARISON:  None. FINDINGS: Brain: No evidence of acute infarction, hemorrhage, hydrocephalus, extra-axial collection or mass lesion/mass effect. Mild subcortical white matter and periventricular small vessel ischemic changes. Vascular: No hyperdense vessel or unexpected calcification. Skull: Normal. Negative for fracture or focal lesion. Sinuses/Orbits: The visualized paranasal sinuses are essentially clear. The mastoid air cells are unopacified. Other: None. IMPRESSION: No evidence of acute intracranial abnormality. Mild small vessel ischemic changes. Electronically Signed   By: Julian Hy M.D.   On: 08/27/2021 01:50   US Venous Img Lower Bilateral (DVT)  Result Date: 08/28/2021 CLINICAL DATA:  Bilateral lower extremity pain and edema. Recent fall. History of DVT and pulmonary embolism. Patient is currently on anticoagulation. Evaluate for acute chronic DVT. EXAM: BILATERAL LOWER EXTREMITY VENOUS DOPPLER ULTRASOUND TECHNIQUE: Gray-scale sonography with graded compression, as well as color Doppler and duplex ultrasound were performed to evaluate the lower extremity deep venous systems from the level of the common femoral vein and including the common femoral, femoral, profunda femoral, popliteal and calf veins including the posterior tibial, peroneal and gastrocnemius veins when visible. The superficial great saphenous vein was also interrogated. Spectral Doppler was utilized to evaluate flow at rest and with distal augmentation maneuvers in the common femoral, femoral and popliteal veins. COMPARISON:  None. FINDINGS: Examination is degraded due to patient body habitus and poor sonographic window. RIGHT LOWER EXTREMITY Common Femoral Vein: No evidence of thrombus. Normal compressibility, respiratory phasicity and response to augmentation. Saphenofemoral Junction: No evidence of thrombus. Normal  compressibility and flow on color Doppler imaging. Profunda Femoral Vein: No evidence of thrombus. Normal compressibility and flow on color Doppler imaging. Femoral Vein: No evidence of thrombus. Normal compressibility, respiratory phasicity and response to augmentation. Popliteal Vein: No evidence of thrombus. Normal compressibility, respiratory phasicity and response to augmentation. Calf Veins: No evidence of thrombus. Normal compressibility and flow on color Doppler imaging. Superficial Great Saphenous Vein: No evidence of thrombus. Normal compressibility. Venous Reflux:  None. Other Findings:  None. LEFT LOWER EXTREMITY Common Femoral Vein: No evidence of thrombus. Normal compressibility, respiratory phasicity and response to augmentation. Saphenofemoral Junction: No evidence of thrombus. Normal compressibility and flow on color Doppler imaging. Profunda Femoral Vein: No evidence of thrombus. Normal compressibility and flow on color Doppler imaging. Femoral Vein: No evidence of thrombus. Normal compressibility, respiratory phasicity and response to augmentation. Popliteal Vein: No evidence of thrombus. Normal compressibility, respiratory phasicity and response to augmentation. Calf Veins: No evidence of thrombus. Normal compressibility and flow on color Doppler imaging. Superficial Great Saphenous Vein: No evidence of thrombus. Normal compressibility. Venous Reflux:  None. Other Findings:  None. IMPRESSION: No evidence of acute or chronic DVT within either lower extremity. Electronically Signed   By: Sandi Mariscal M.D.   On: 08/28/2021 09:29   DG Chest Port 1 View  Result Date: 08/26/2021 CLINICAL DATA:  Sepsis EXAM: PORTABLE CHEST 1 VIEW COMPARISON:  03/21/2021 FINDINGS: Chronic interstitial thickening is unchanged. Lungs  are otherwise clear. No pneumothorax or pleural effusion. Cardiac size within normal limits. Pulmonary vascularity is normal. Osseous structures are age-appropriate. No acute bone  abnormality. IMPRESSION: No active disease. Electronically Signed   By: Fidela Salisbury M.D.   On: 08/26/2021 19:08    SIGNED: Deatra James, MD, FHM. [facility],  Pager (please use [email or web address] to page/text) Please use Epic Secure Chat for non-urgent communication (7AM-7PM)  If 7PM-7AM, please contact night-coverage [email or web address], 08/28/2021, 3:49 PM

## 2021-08-28 NOTE — Plan of Care (Signed)
  Problem: Acute Rehab OT Goals (only OT should resolve) Goal: Pt. Will Perform Grooming Flowsheets (Taken 08/28/2021 1219) Pt Will Perform Grooming:  with min guard assist  standing  with adaptive equipment Goal: Pt. Will Perform Upper Body Dressing Flowsheets (Taken 08/28/2021 1219) Pt Will Perform Upper Body Dressing:  with modified independence  sitting Goal: Pt. Will Perform Lower Body Dressing Flowsheets (Taken 08/28/2021 1219) Pt Will Perform Lower Body Dressing:  with modified independence  sitting/lateral leans  with adaptive equipment Goal: Pt. Will Transfer To Toilet [place] (Taken 08/28/2021 1219) Pt Will Transfer to Toilet:  with min guard assist  stand pivot transfer Goal: Pt/Caregiver Will Perform Home Exercise Program Flowsheets (Taken 08/28/2021 1219) Pt/caregiver will Perform Home Exercise Program:  Increased ROM  Increased strength  Right Upper extremity  Left upper extremity  Independently  Terence Bart OT, MOT

## 2021-08-28 NOTE — Progress Notes (Signed)
Physical Therapy Treatment Patient Details Name: Natasha Glass MRN: [id number] DOB: [birth year] Today's Date: 08/28/2021   History of Present Illness Natasha Glass  is a 80 y.o. female with a chief complaint of generalized weakness. PMH: arthritis, DVT, GERD, HTN, macular degeneration, obesity, osteopenia, afib, peripheral neuropathy, PE 2010, thoracic ascending aortic anneurysm, diabetes    PT Comments    Pt requires heavy mod A to power up from recliner with rocking momentum, dependent on BUE assisting and multiple attempts, limited by L knee pain. Pt requires +2 to power to stand from low seated toileted. Pt ambulates to/from restroom with RW, min A to steady and VC for foot clearance to decrease fear of falling. Pt tolerates BLE strengthening exercises with increased L knee pain and decreased AROM compared to R side. Pt verbalized understanding of assistance needed and agreeable to SNF at [facility]. Pt states will only go to [facility] due to son being local to [place]- will pass to [facility]. Pt limited by L knee pain and will continue to progress as able.    Recommendations for follow up therapy are one component of a multi-disciplinary discharge planning process, led by the attending physician.  Recommendations may be updated based on patient status, additional functional criteria and insurance authorization.  Follow Up Recommendations  Skilled nursing-short term rehab (<3 hours/day)     Assistance Recommended at Discharge Frequent or constant Supervision/Assistance  Equipment Recommendations  None recommended by PT    Recommendations for Other Services       Precautions / Restrictions Precautions Precautions: Fall Restrictions Weight Bearing Restrictions: No     Mobility  Bed Mobility  General bed mobility comments: in recliner    Transfers Overall transfer level: Needs assistance Equipment used: Rolling walker (2 wheels) Transfers: Sit to/from Stand Sit to Stand: Mod  assist;+2 physical assistance  General transfer comment: heavy mod A to power up from recliner with pt pulling on bedrail with R UE and L UE pushing from recliner; mod A +2 to power up from toilet with rocking momentum and multiple attempts    Ambulation/Gait Ambulation/Gait assistance: Min assist Gait Distance (Feet): 10 Feet (x2) Assistive device: Rolling walker (2 wheels) Gait Pattern/deviations: Step-to pattern;Decreased stride length;Decreased weight shift to left Gait velocity: decreased  General Gait Details: very slow step to pattern with decreased L weight-bearing due to pain, minimal bil foot clearance, slightly unsteady requiring min A and high fear of falling requiring encouragement   Stairs             Wheelchair Mobility    Modified Rankin (Stroke Patients Only)       Balance Overall balance assessment: Needs assistance  Standing balance support: During functional activity;Bilateral upper extremity supported;Reliant on assistive device for balance Standing balance-Leahy Scale: Poor     Cognition Arousal/Alertness: Awake/alert Behavior During Therapy: WFL for tasks assessed/performed Overall Cognitive Status: Within Functional Limits for tasks assessed       Exercises General Exercises - Lower Extremity Long Arc Quad: Seated;AROM;Strengthening;Both;5 reps (2 sets) Hip Flexion/Marching: Seated;AROM;Strengthening;Both;5 reps (2 sets) Toe Raises: Seated;AROM;Strengthening;Both;15 reps Heel Raises: Seated;AROM;Strengthening;Both;15 reps    General Comments        Pertinent Vitals/Pain Pain Assessment: 0-10 Pain Score: 10-Worst pain ever Pain Location: L knee Pain Descriptors / Indicators: Sore Pain Intervention(s): Limited activity within patient's tolerance;Monitored during session    Home Living  Prior Function            PT Goals (current goals can now be found in the care plan section) Acute Rehab PT  Goals Patient Stated Goal: Return home after rehab. PT Goal Formulation: With patient/family Time For Goal Achievement: 09/10/21 Potential to Achieve Goals: Fair Progress towards PT goals: Progressing toward goals    Frequency    Min 3X/week      PT Plan Current plan remains appropriate    Co-evaluation              AM-PAC PT "6 Clicks" Mobility   Outcome Measure  Help needed turning from your back to your side while in a flat bed without using bedrails?: A Lot Help needed moving from lying on your back to sitting on the side of a flat bed without using bedrails?: A Lot Help needed moving to and from a bed to a chair (including a wheelchair)?: A Lot Help needed standing up from a chair using your arms (e.g., wheelchair or bedside chair)?: A Lot Help needed to walk in hospital room?: A Lot Help needed climbing 3-5 steps with a railing? : Total 6 Click Score: 11    End of Session   Activity Tolerance: Patient limited by fatigue;Patient limited by pain Patient left: in chair;with call bell/phone within reach;with nursing/sitter in room;with family/visitor present Nurse Communication: Mobility status PT Visit Diagnosis: Unsteadiness on feet (R26.81);Other abnormalities of gait and mobility (R26.89);Muscle weakness (generalized) (M62.81)     Time: 2957-4734 PT Time Calculation (min) (ACUTE ONLY): 41 min  Charges:  $Gait Training: 8-22 mins $Therapeutic Exercise: 8-22 mins $Therapeutic Activity: 8-22 mins                      Tori Shellene Sweigert PT, DPT 08/28/21, 3:06 PM

## 2021-08-28 NOTE — Progress Notes (Signed)
Date and time results received: 08/28/21 0110 (use smartphrase ".now" to insert current time)  Test: blood cultures Critical Value: + aerobic and anaerobic staph species, no resistance  Name of Provider Notified: Dr. Carlynn Purl  Orders Received? Or Actions Taken?: Actions Taken: MD on call notified

## 2021-08-28 NOTE — Progress Notes (Signed)
Progress Note  Patient Name: Natasha Glass Date of Encounter: 08/28/2021  Primary Cardiologist: Dr. Thompson Grayer  Subjective   No palpitations or shortness of breath this morning.  Inpatient Medications    Scheduled Meds:  dofetilide  250 mcg Oral BID   insulin aspart  0-15 Units Subcutaneous TID WC   magnesium oxide  400 mg Oral Daily   metoprolol tartrate  50 mg Oral BID   nystatin ointment   Topical BID   potassium chloride  20 mEq Oral Daily   vitamin B-12  500 mcg Oral Daily   warfarin  5 mg Oral q1600   Warfarin - Physician Dosing Inpatient   Does not apply q1600   Continuous Infusions:  cefTRIAXone (ROCEPHIN)  IV 2 g (08/27/21 1709)   PRN Meds: acetaminophen **OR** acetaminophen, diltiazem, HYDROcodone-acetaminophen, ondansetron **OR** ondansetron (ZOFRAN) IV   Vital Signs    Vitals:   08/27/21 1948 08/27/21 2152 08/28/21 0231 08/28/21 0556  BP: 117/76 117/72 121/88 110/72  Pulse: (!) 116 82 (!) 105 98  Resp: 20 20 14 18   Temp:  98.2 F (36.8 C) 98.3 F (36.8 C) 97.6 F (36.4 C)  TempSrc:  Oral Oral   SpO2: 98% 96% 97% 96%  Weight:      Height:        Intake/Output Summary (Last 24 hours) at 08/28/2021 0844 Last data filed at 08/28/2021 0500 Gross per 24 hour  Intake 2009.5 ml  Output 800 ml  Net 1209.5 ml   Filed Weights   08/26/21 1800 08/27/21 0829  Weight: 128.4 kg 127.7 kg    Telemetry    Rate controlled atrial fibrillation.  Personally reviewed.  ECG    An ECG dated 08/28/2021 was personally reviewed today and demonstrated:  Atrial fibrillation with QTc 454 ms.  Physical Exam   GEN: No acute distress.   Neck: No JVD. Cardiac: Irregularly irregular, 1/6 systolic murmur. Respiratory: Nonlabored. Clear to auscultation bilaterally. GI: Soft, nontender, bowel sounds present. MS: Lymphedema, erythema right lower extremity. Neuro:  Nonfocal. Psych: Alert and oriented x 3. Normal affect.  Labs    Chemistry Recent Labs  Lab  08/26/21 1838 08/27/21 0335 08/28/21 0501  NA 136 138 136  K 4.3 3.6 3.7  CL 100 106 105  CO2 24 26 27   GLUCOSE 159* 146* 124*  BUN 23 20 14   CREATININE 0.76 0.61 0.53  CALCIUM 9.2 8.3* 8.0*  PROT 7.7 6.4*  --   ALBUMIN 4.0 3.2*  --   AST 28 32  --   ALT 20 18  --   ALKPHOS 57 48  --   BILITOT 0.8 0.6  --   GFRNONAA >60 >60 >60  ANIONGAP 12 6 4*     Hematology Recent Labs  Lab 08/26/21 1838 08/27/21 0335 08/28/21 0501  WBC 14.3* 8.6 6.7  RBC 4.66 3.98 3.85*  HGB 13.4 11.5* 11.2*  HCT 42.6 37.1 35.8*  MCV 91.4 93.2 93.0  MCH 28.8 28.9 29.1  MCHC 31.5 31.0 31.3  RDW 17.3* 17.4* 17.5*  PLT 126* DCLMP 104*    Radiology    DG Knee 1-2 Views Left  Result Date: 08/27/2021 CLINICAL DATA:  Knee pain EXAM: LEFT KNEE - 1-2 VIEW COMPARISON:  None. FINDINGS: Left knee arthroplasty. No fracture or dislocation is seen. Small to moderate suprapatellar knee joint effusion. IMPRESSION: Left knee arthroplasty. Small to moderate suprapatellar knee joint effusion. Electronically Signed   By: Julian Hy M.D.   On: 08/27/2021 01:48  CT HEAD WO CONTRAST (5MM)  Result Date: 08/27/2021 CLINICAL DATA:  Weakness, fatigue EXAM: CT HEAD WITHOUT CONTRAST TECHNIQUE: Contiguous axial images were obtained from the base of the skull through the vertex without intravenous contrast. COMPARISON:  None. FINDINGS: Brain: No evidence of acute infarction, hemorrhage, hydrocephalus, extra-axial collection or mass lesion/mass effect. Mild subcortical white matter and periventricular small vessel ischemic changes. Vascular: No hyperdense vessel or unexpected calcification. Skull: Normal. Negative for fracture or focal lesion. Sinuses/Orbits: The visualized paranasal sinuses are essentially clear. The mastoid air cells are unopacified. Other: None. IMPRESSION: No evidence of acute intracranial abnormality. Mild small vessel ischemic changes. Electronically Signed   By: Julian Hy M.D.   On:  08/27/2021 01:50   DG Chest Port 1 View  Result Date: 08/26/2021 CLINICAL DATA:  Sepsis EXAM: PORTABLE CHEST 1 VIEW COMPARISON:  03/21/2021 FINDINGS: Chronic interstitial thickening is unchanged. Lungs are otherwise clear. No pneumothorax or pleural effusion. Cardiac size within normal limits. Pulmonary vascularity is normal. Osseous structures are age-appropriate. No acute bone abnormality. IMPRESSION: No active disease. Electronically Signed   By: Fidela Salisbury M.D.   On: 08/26/2021 19:08    Assessment & Plan    1.  Paroxysmal to persistent atrial fibrillation with CHA2DS2-VASc score of 5.  She had recurrent arrhythmia yesterday which persists today but heart rate is controlled at this point and she has been placed on her standing cardiac regimen including Tikosyn and Lopressor.  Short acting Cardizem available for breakthrough RVR.  Follow-up ECG this morning reviewed.  2.  Essential hypertension.  Blood pressure is normal, losartan remains on hold.  Continue Tikosyn and Lopressor on standing basis, short acting Cardizem available for RVR.  Increasing both potassium and magnesium supplements, follow-up BMET and magnesium tomorrow.  No indication for cardioversion now, she still may convert with time.  Can reconsider if she remains in atrial fibrillation after other acute issues have resolved.  Signed, Rozann Lesches, MD  08/28/2021, 8:44 AM

## 2021-08-28 NOTE — Evaluation (Signed)
Occupational Therapy Evaluation Patient Details Name: Natasha Glass MRN: [id number] DOB: 1941/08/02 Today's Date: 08/28/2021   History of Present Illness Natasha Glass  is a 80 y.o. female, with history of atrial fibrillation, DVT, PE, hyperlipidemia, hypertension, lactose intolerance, macular degeneration, peripheral neuropathy, sleep apnea, type 2 diabetes mellitus, and more presents the ED with a chief complaint of generalized weakness.  Patient reports that she suddenly noticed weakness today.  She tried to get up, and her left leg was asleep and she fell onto the floor in her left side.  She reports that there was some pain at that time but it went away spontaneously.  She was not able to get up.  At baseline she is not able to get up if she is not completely on the floor.  When she is found herself on the floor previously she would scoot over to the front steps, where she could put her legs down underneath her and then stand up.  She reports she did not even try that today because she was too weak.  At baseline she ambulates with a walker, and she did have a walker close to her, but she could not reach it.  She reports she normally stands up and then grabs a walker.  Patient denies hitting her head or losing consciousness.  Patient had no preceding factors to her fall, including no chest pain, palpitations, dizziness, dyspnea.  Patient reports that she just felt like her leg was weak and slid down to the floor.  She does feel like her knee catches now.  She reports she is unable to bend it past 45 degrees at this time.  Not because of pain, because the knee will not bend.  We will get an x-ray of the left knee.  Patient thinks it her symptoms came on suddenly, but per her own story she was not behaving as her normal [place] this morning.  She normally gets up and eats breakfast early and then goes to church.  She did not get up, did not feel hungry, slept through church.  This is abnormal for her.  Son at  bedside says he saw her yesterday and she was ambulating with just her cane, and doing fine.  She has complained of dysuria for the last 2 or 3 weeks.  Patient does have a history of chronic UTIs, and just finished a course of antibiotics 2 weeks ago.  She has not noticed any fevers at home.  She has not noticed if her right leg has been gradually becoming more erythematous or not.  She reports that her legs are always erythematous, but is visibly more erythematous than the left and she is not aware of that.   Clinical Impression   Pt agreeable to OT evaluation today. Pt demonstrates limited shoulder A/ROM against gravity as a baseline issue. Pt required min to mod A for supine to sit bed mobility with slow labored movement due to L LE pain. Pt requires Mod A for sit to stand from EOB and from chair. Once standing pt needs more min G to min A for steps forward and backward. Pt reports more interested in home health but was recommended SNF rehab due to level of assist needed for mobility at this point. Pt will benefit from continued OT in the hospital and recommended venue below to increase strength, balance, and endurance for safe ADL's.        Recommendations for follow up therapy are one component of a multi-disciplinary  discharge planning process, led by the attending physician.  Recommendations may be updated based on patient status, additional functional criteria and insurance authorization.   Follow Up Recommendations  Skilled nursing-short term rehab (<3 hours/day)    Assistance Recommended at Discharge Intermittent Supervision/Assistance  Functional Status Assessment  Patient has had a recent decline in their functional status and demonstrates the ability to make significant improvements in function in a reasonable and predictable amount of time.  Equipment Recommendations  None recommended by OT    Recommendations for Other Services       Precautions / Restrictions  Precautions Precautions: Fall Restrictions Weight Bearing Restrictions: No      Mobility Bed Mobility Overal bed mobility: Needs Assistance Bed Mobility: Supine to Sit     Supine to sit: Min assist;Mod assist     General bed mobility comments: Required support of legs for transitioning from supine w/ HOB elevated to sitting at EOB. Single hand held assist also needed.    Transfers Overall transfer level: Needs assistance Equipment used: Rolling walker (2 wheels) Transfers: Sit to/from Stand;Bed to chair/wheelchair/BSC Sit to Stand: Mod assist     Step pivot transfers: Min assist;Mod assist     General transfer comment: Slow labored movement.      Balance Overall balance assessment: Needs assistance Sitting-balance support: Bilateral upper extremity supported;Feet supported Sitting balance-Leahy Scale: Fair Sitting balance - Comments: fair/good sitting at EOB   Standing balance support: Bilateral upper extremity supported;During functional activity Standing balance-Leahy Scale: Fair Standing balance comment: poor to fair using RW                           ADL either performed or assessed with clinical judgement   ADL Overall ADL's : Needs assistance/impaired                         Toilet Transfer: Moderate assistance;Stand-pivot;Rolling walker (2 wheels) Toilet Transfer Details (indicate cue type and reason): Simulated via EOB to chair transfer using RW.         Functional mobility during ADLs: Minimal assistance;Rolling walker (2 wheels) General ADL Comments: Pt able to functionally ambulate forward and backward with RW with slow movement.     Vision Baseline Vision/History: 6 Macular Degeneration;1 Wears glasses Ability to See in Adequate Light: 1 Impaired Patient Visual Report: No change from baseline (Pt reports needing regular shots in L eye for macular degeneration.) Vision Assessment?: No apparent visual deficits (outisde  basline deficits)                Pertinent Vitals/Pain Pain Assessment: 0-10 Pain Score: 10-Worst pain ever Pain Location: L LE with movement Pain Descriptors / Indicators: Sharp Pain Intervention(s): Limited activity within patient's tolerance;Monitored during session;Repositioned     Hand Dominance Right   Extremity/Trunk Assessment Upper Extremity Assessment Upper Extremity Assessment: Generalized weakness;RUE deficits/detail;LUE deficits/detail RUE Deficits / Details: 2+/5 shoulder flexion and abduction. Pt reports this has been a baseline issues for ~10 years. LUE Deficits / Details: Generally weak.   Lower Extremity Assessment Lower Extremity Assessment: Defer to PT evaluation   Cervical / Trunk Assessment Cervical / Trunk Assessment: Normal   Communication Communication Communication: No difficulties   Cognition Arousal/Alertness: Awake/alert Behavior During Therapy: WFL for tasks assessed/performed Overall Cognitive Status: Within Functional Limits for tasks assessed  Home Living Family/patient expects to be discharged to:: Private residence Living Arrangements: Alone Available Help at Discharge: Family Type of Home: House Home Access: Stairs to enter Technical brewer of Steps: 1 Entrance Stairs-Rails: None Home Layout: One level     Bathroom Shower/Tub: Occupational psychologist: Handicapped height     [place]: Rollator (4 wheels);Grab bars - tub/shower;Shower seat - built in;BSC/3in1;Cane - quad;Cane - single point;Hospital bed          Prior Functioning/Environment Prior Level of Function : Driving;Independent/Modified Independent             Mobility Comments: Modified independent household and community ambulator, drives. Patient reports using a quad cane and SPC to ambulate occasionally at church and to ambulate to her mailbox. Patient  reports using a rollator most of the time and sleeping in her lift chair at night. ADLs Comments: Patient reports independence w/ ADL's.        OT Problem List: Decreased strength;Decreased range of motion;Decreased activity tolerance;Impaired balance (sitting and/or standing);Obesity;Pain      OT Treatment/Interventions: [place]-care/ADL training;Therapeutic exercise;Manual therapy;Therapeutic activities;Patient/family education;Balance training    OT Goals(Current goals can be found in the care plan section) Acute Rehab OT Goals Patient Stated Goal: return home OT Goal Formulation: With patient Time For Goal Achievement: 09/11/21 Potential to Achieve Goals: Good  OT Frequency: Min 2X/week    End of Session Equipment Utilized During Treatment: Rolling walker (2 wheels) Nurse Communication: Other (comment);Mobility status (Notified NT pt was in chair with need of bed change.)  Activity Tolerance: Patient tolerated treatment well Patient left: in chair;with call bell/phone within reach  OT Visit Diagnosis: Unsteadiness on feet (R26.81);Other abnormalities of gait and mobility (R26.89);Muscle weakness (generalized) (M62.81);History of falling (Z91.81);Pain Pain - Right/Left: Left Pain - part of body: Leg                Time: 1025-1045 OT Time Calculation (min): 20 min Charges:  OT General Charges $OT Visit: 1 Visit OT Evaluation $OT Eval Moderate Complexity: 1 Mod  Timmya Blazier OT, MOT  Larey Seat 08/28/2021, 12:15 PM

## 2021-08-29 DIAGNOSIS — I4819 Other persistent atrial fibrillation: Secondary | ICD-10-CM | POA: Diagnosis not present

## 2021-08-29 DIAGNOSIS — N3001 Acute cystitis with hematuria: Secondary | ICD-10-CM

## 2021-08-29 LAB — CBC
HCT: 34.8 % — ABNORMAL LOW (ref 36.0–46.0)
Hemoglobin: 10.7 g/dL — ABNORMAL LOW (ref 12.0–15.0)
MCH: 28.3 pg (ref 26.0–34.0)
MCHC: 30.7 g/dL (ref 30.0–36.0)
MCV: 92.1 fL (ref 80.0–100.0)
Platelets: 121 10*3/uL — ABNORMAL LOW (ref 150–400)
RBC: 3.78 MIL/uL — ABNORMAL LOW (ref 3.87–5.11)
RDW: 17.2 % — ABNORMAL HIGH (ref 11.5–15.5)
WBC: 5.5 10*3/uL (ref 4.0–10.5)
nRBC: 0 % (ref 0.0–0.2)

## 2021-08-29 LAB — GLUCOSE, CAPILLARY
Glucose-Capillary: 99 mg/dL (ref 70–99)
Glucose-Capillary: 118 mg/dL — ABNORMAL HIGH (ref 70–99)
Glucose-Capillary: 118 mg/dL — ABNORMAL HIGH (ref 70–99)
Glucose-Capillary: 125 mg/dL — ABNORMAL HIGH (ref 70–99)

## 2021-08-29 LAB — BASIC METABOLIC PANEL
Anion gap: 7 (ref 5–15)
BUN: 11 mg/dL (ref 8–23)
CO2: 26 mmol/L (ref 22–32)
Calcium: 8.1 mg/dL — ABNORMAL LOW (ref 8.9–10.3)
Chloride: 105 mmol/L (ref 98–111)
Creatinine, Ser: 0.49 mg/dL (ref 0.44–1.00)
GFR, Estimated: >60 mL/min (ref >60)
Glucose, Bld: 111 mg/dL — ABNORMAL HIGH (ref 70–99)
Potassium: 4.2 mmol/L (ref 3.5–5.1)
Sodium: 138 mmol/L (ref 135–145)

## 2021-08-29 LAB — PROTIME-INR
INR: 2.1 — ABNORMAL HIGH (ref 0.8–1.2)
Prothrombin Time: 23.8 seconds — ABNORMAL HIGH (ref 11.4–15.2)

## 2021-08-29 MED ORDER — FUROSEMIDE 40 MG PO TABS
40.0000 mg | ORAL_TABLET | Freq: Every day | ORAL | Status: DC
  Filled 2021-08-29: qty 1

## 2021-08-29 MED ORDER — FUROSEMIDE 10 MG/ML IJ SOLN
40.0000 mg | Freq: Once | INTRAMUSCULAR | Status: AC
  Administered 2021-08-29: 40 mg via INTRAVENOUS
  Filled 2021-08-29: qty 4

## 2021-08-29 MED ORDER — METOPROLOL TARTRATE 50 MG PO TABS
75.0000 mg | ORAL_TABLET | Freq: Two times a day (BID) | ORAL | Status: DC
  Administered 2021-08-29: 75 mg via ORAL
  Filled 2021-08-29 (×2): qty 1

## 2021-08-29 NOTE — Progress Notes (Signed)
Occupational Therapy Treatment Patient Details Name: Natasha Glass MRN: [id number] DOB: 1940-12-25 Today's Date: 08/29/2021   History of present illness Natasha Glass  is a 80 y.o. female with a chief complaint of generalized weakness. PMH: arthritis, DVT, GERD, HTN, macular degeneration, obesity, osteopenia, afib, peripheral neuropathy, PE 2010, thoracic ascending aortic anneurysm, diabetes   OT comments  Pt agreeable to OT treatment. Pt still requires moderate assist for initial sit to stand using RW and gait belt. Pt was able to ambulate to sink and stand for grooming tasks with Min G assist for ~6.5 minutes today before returning to chair. Pt educated on A/AROM with use of cane to work on R UE shoulder ROM. Pt demonstrates most limitations in A/AROM with shoulder abduction. Pt educated to complete supine to reduce difficulty. Pt left in chair with call bell within reach. Pt will benefit from continued OT in the hospital and recommended venue below to increase strength, balance, and endurance for safe ADL's.       Recommendations for follow up therapy are one component of a multi-disciplinary discharge planning process, led by the attending physician.  Recommendations may be updated based on patient status, additional functional criteria and insurance authorization.    Follow Up Recommendations  Skilled nursing-short term rehab (<3 hours/day)    Assistance Recommended at Discharge Intermittent Supervision/Assistance  Equipment Recommendations  None recommended by OT    Recommendations for Other Services      Precautions / Restrictions Precautions Precautions: Fall Restrictions Weight Bearing Restrictions: No       Mobility Bed Mobility                    Transfers Overall transfer level: Needs assistance Equipment used: Rolling walker (2 wheels) Transfers: Sit to/from Stand Sit to Stand: Mod assist   Step pivot transfers: Mod assist;Min guard       General  transfer comment: Mod A needed for initial sit to stand but pt at level of Min G when standing and ambulating for trasnfers.     Balance Overall balance assessment: Needs assistance         Standing balance support: During functional activity;Bilateral upper extremity supported;Reliant on assistive device for balance Standing balance-Leahy Scale: Poor Standing balance comment: poor to fair using RW                           ADL either performed or assessed with clinical judgement   ADL Overall ADL's : Needs assistance/impaired     Grooming: Wash/dry hands;Oral care;Brushing hair;Standing;Min guard Grooming Details (indicate cue type and reason): Pt completed standing with RW at sink for ~6 minutes.                             Functional mobility during ADLs: Min guard;Rolling walker (2 wheels) General ADL Comments: Pt able to ambulate to sink with Min G assist using RW.      Cognition Arousal/Alertness: Awake/alert Behavior During Therapy: WFL for tasks assessed/performed Overall Cognitive Status: Within Functional Limits for tasks assessed                                            Exercises Exercises: General Upper Extremity General Exercises - Upper Extremity Shoulder Flexion: AAROM;5 reps;Seated;Both Shoulder ABduction: Right;AAROM;Seated;5 reps (x5  internal and external rotation A/AROM seated) Shoulder Horizontal ABduction: AAROM;Both;Seated (x5 protraction A/AROM) Chair Push Up: Other (comment) (x3 chair push up/sit to stand; moderate assist needed)                Pertinent Vitals/ Pain       Pain Assessment: 0-10 Pain Score: 6  Pain Location: L knee when standing Pain Descriptors / Indicators: Sharp Pain Intervention(s): Limited activity within patient's tolerance;Monitored during session;Repositioned                                                          Frequency  Min 2X/week         Progress Toward Goals  OT Goals(current goals can now be found in the care plan section)  Progress towards OT goals: Progressing toward goals  Acute Rehab OT Goals Patient Stated Goal: return home OT Goal Formulation: With patient Time For Goal Achievement: 09/11/21 Potential to Achieve Goals: Good ADL Goals Pt Will Perform Grooming: with min guard assist;standing;with adaptive equipment Pt Will Perform Upper Body Dressing: with modified independence;sitting Pt Will Perform Lower Body Dressing: with modified independence;sitting/lateral leans;with adaptive equipment Pt Will Transfer to Toilet: with min guard assist;stand pivot transfer Pt/caregiver will Perform Home Exercise Program: Increased ROM;Increased strength;Right Upper extremity;Left upper extremity;Independently  Plan Discharge plan remains appropriate                                    End of Session Equipment Utilized During Treatment: Rolling walker (2 wheels)  OT Visit Diagnosis: Unsteadiness on feet (R26.81);Other abnormalities of gait and mobility (R26.89);Muscle weakness (generalized) (M62.81);History of falling (Z91.81);Pain Pain - Right/Left: Left Pain - part of body: Leg   Activity Tolerance Patient tolerated treatment well   Patient Left in chair;with call bell/phone within reach   Nurse Communication          Time: 1031-5945 OT Time Calculation (min): 30 min  Charges: OT General Charges $OT Visit: 1 Visit OT Treatments $[facility]/Home Management : 8-22 mins $Therapeutic Exercise: 8-22 mins  Quetzalli Clos OT, MOT  Larey Seat 08/29/2021, 10:09 AM

## 2021-08-29 NOTE — Progress Notes (Addendum)
[facility]                                                                              Patient Demographics  Natasha Glass, is a 80 y.o. female, DOB - [birth year], OIT:[id number]  Admit date - 08/26/2021   Admitting Physician Rolla Plate, DO  Outpatient Primary MD for the patient is Josetta Huddle, MD  Outpatient specialists:   LOS - 3  days   Medical records reviewed and are as summarized below:    Chief Complaint  Patient presents with   Fatigue       Brief summary   Patient is a 80 year old female with history of A. fib, DVT, PE, HLP, hypertension, peripheral neuropathy, sleep apnea, diabetes mellitus type 2 presented with generalized weakness.  Patient reported sudden weakness on the day of admission, try to get up and her left leg was asleep and she fell onto the floor on her left side.  Pain spontaneously resolved however she is was not able to get up, at baseline ambulates with a walker.  She had complained of dysuria for the last 2 or 3 weeks, has a history of chronic UTIs, had finished a course of antibiotics 2 weeks ago In ED, febrile, 1001.9, heart rate 85-1 04, respiratory rate 17-31, BP 104/58, O2 sats 92%.  UA showed UTI.  Physical exam with cellulitis of the right lower extremity.  Chest x-ray negative.  Lactic acid 3.2 patient was admitted for further work-up.   Assessment & Plan   Sepsis secondary to UTI, right lower extremity cellulitis, POA -Met sepsis criteria on admission, febrile, tachycardia, tachypnea, leukocytosis with lactic acidosis.  Source likely due to UTI/cellulitis -Currently improving, patient was placed on aggressive IV fluid hydration, -Leukocytosis improved, -Urine culture showed less than 10,000 colonies  bilateral lower extremity cellulitis with venous stasis -No acute DVT -Continue antibiotics  Persistent atrial fibrillation -Cardiology following, continue Lopressor, Tikosyn follows Dr. Rayann Heman in the A. fib   clinic -INR therapeutic, continue warfarin per pharmacy  Chronic diastolic CHF -Per patient she is on Lasix 40 mg daily at home, occasionally 60 mg  -Lasix  was held on admission due to #1, feeling somewhat dyspnea on exertion and swelling in her lower extremities -I's and O's with 2 L positive, will give Lasix 40 mg IV x1, resume Lasix p.o. 40 mg daily in a.m.   Acute metabolic encephalopathy -Likely due to #1, resolved, currently alert and oriented x3, at baseline  Generalized debility, falls, left knee pain -PT evaluation recommended SNF -X-ray left knee showed small to moderate suprapatellar knee joint effusion, left knee arthroplasty. CT head negative for any acute abnormalities  Essential hypertension -BP was somewhat soft at the time of admission, hence antihypertensives were held  History of DVT/PE/A. Fib Continue warfarin  Diabetes mellitus type 2, NIDDM, uncontrolled with hyperglycemia - continue SSI.  Recent Labs    08/28/21 1119 08/28/21 1644 08/28/21 2125 08/29/21 0756 08/29/21 1215 08/29/21 1602  GLUCAP 146* 93 145* 99 118* 118*      Obesity Estimated body mass index is 49.88 kg/m as calculated from the following:   Height as of  this encounter: _0  (1.6 m).   Weight as of this encounter: 127.7 kg.  Code Status: Full CODE STATUS DVT Prophylaxis:   warfarin (COUMADIN) tablet 5 mg   Level of Care: Level of care: Telemetry Family Communication: Discussed all imaging results, lab results, explained to the patient    Disposition Plan:     Status is: Inpatient  Remains inpatient appropriate because: Requiring IV Lasix today, hopefully DC to STR/SNF in 24 to 48 hours once bed available  Time Spent in minutes 35 minutes   Procedures:  None  Consultants:   Cardiology  Antimicrobials:   Anti-infectives (From admission, onward)    Start     Dose/Rate Route Frequency Ordered Stop   08/27/21 1700  cefTRIAXone (ROCEPHIN) 2 g in sodium chloride 0.9  % 100 mL IVPB        2 g 200 mL/hr over 30 Minutes Intravenous Every 24 hours 08/27/21 0246 09/03/21 1659   08/26/21 1845  cefTRIAXone (ROCEPHIN) 1 g in sodium chloride 0.9 % 100 mL IVPB  Status:  Discontinued        1 g 200 mL/hr over 30 Minutes Intravenous  Once 08/26/21 1834 08/26/21 1842   08/26/21 1845  cefTRIAXone (ROCEPHIN) 2 g in sodium chloride 0.9 % 100 mL IVPB        2 g 200 mL/hr over 30 Minutes Intravenous  Once 08/26/21 1842 08/26/21 1936          Medications  Scheduled Meds:  dofetilide  250 mcg Oral BID   [START ON 08/30/2021] furosemide  40 mg Oral Daily   insulin aspart  0-15 Units Subcutaneous TID WC   magnesium oxide  800 mg Oral Daily   metoprolol tartrate  75 mg Oral BID   nystatin ointment   Topical BID   potassium chloride  40 mEq Oral Daily   vitamin B-12  500 mcg Oral Daily   warfarin  5 mg Oral q1600   Warfarin - Physician Dosing Inpatient   Does not apply q1600   Continuous Infusions:  cefTRIAXone (ROCEPHIN)  IV 2 g (08/28/21 1518)   PRN Meds:.acetaminophen **OR** acetaminophen, diltiazem, HYDROcodone-acetaminophen, ondansetron **OR** ondansetron (ZOFRAN) IV      Subjective:   [name] was seen and examined today.  Still feels weak, trying to ambulate with walker with physical therapy.  States has not received Lasix for 2 days, now feeling somewhat dyspneic with exertion and lower extremity swelling.  Patient denies dizziness, abdominal pain, N/V/D/C,  No acute events overnight.    Objective:   Vitals:   08/28/21 1325 08/28/21 2123 08/29/21 0604 08/29/21 0754  BP: 103/67 121/76 126/69 (!) 143/80  Pulse: 84 92 (!) 102 (!) 106  Resp: _1 Temp: 98.4 F (36.9 C) 98 F (36.7 C) 98.4 F (36.9 C)   TempSrc: Oral Oral Oral   SpO2: 97% 97% 97% 97%  Weight:      Height:        Intake/Output Summary (Last 24 hours) at 08/29/2021 1216 Last data filed at 08/29/2021 0501 Gross per 24 hour  Intake --  Output 700 ml  Net -700 ml      Wt Readings from Last 3 Encounters:  08/27/21 127.7 kg  08/06/21 128.4 kg  04/18/21 130.4 kg     Exam General: Alert and oriented x 3, NAD Cardiovascular: Irregularly irregular Respiratory: Clear to auscultation bilaterally, no wheezing, rales or rhonchi Gastrointestinal: Soft, nontender, nondistended, + bowel sounds Ext: 1+, bilateral lower  extremity venous stasis, erythema RLE improving Neuro:  no acute neurodeficits Skin: Erythema right lower extremity, improving Psych: Normal affect and demeanor, alert and oriented x3    Data Reviewed:  I have personally reviewed following labs and imaging studies  Micro Results Recent Results (from the past 240 hour(s))  Blood Culture (routine x 2)     Status: None (Preliminary result)   Collection Time: 08/26/21  6:39 PM   Specimen: Right Antecubital; Blood  Result Value Ref Range Status   Specimen Description   Final    RIGHT ANTECUBITAL BOTTLES DRAWN AEROBIC AND ANAEROBIC Performed at [facility], [address], [place], [place] [address]    Special Requests   Final    Blood Culture results may not be optimal due to an inadequate volume of blood received in culture bottles Performed at [facility], [address] [place], [place] [address]    Culture  Setup Time   Final    GRAM POSITIVE COCCI IN CLUSTERS Gram Stain Report Called to,Read Back By and Verified With: CRYSTAL REYNOLD @ 1908 ON 08/27/21 C VARNER IN BOTH AEROBIC AND ANAEROBIC BOTTLES Organism ID to follow CRITICAL RESULT CALLED TO, READ BACK BY AND VERIFIED WITH: PIA PARRIS RN 08/28/2021 _0  BY JW    Culture   Final    GRAM POSITIVE COCCI IDENTIFICATION TO FOLLOW Performed at [facility], [place] [address], [place], [place] [address]    Report Status PENDING  Incomplete  Blood Culture (routine x 2)     Status: None (Preliminary result)   Collection Time: 08/26/21  6:39 PM   Specimen: Left Antecubital; Blood  Result Value Ref Range Status   Specimen  Description   Final    LEFT ANTECUBITAL BOTTLES DRAWN AEROBIC AND ANAEROBIC   Special Requests   Final    Blood Culture results may not be optimal due to an inadequate volume of blood received in culture bottles   Culture   Final    NO GROWTH 2 DAYS Performed at [facility], [address], [place], [place] [address]    Report Status PENDING  Incomplete  Blood Culture ID Panel (Reflexed)     Status: Abnormal   Collection Time: 08/26/21  6:39 PM  Result Value Ref Range Status   Enterococcus faecalis NOT DETECTED NOT DETECTED Final   Enterococcus Faecium NOT DETECTED NOT DETECTED Final   Listeria monocytogenes NOT DETECTED NOT DETECTED Final   Staphylococcus species DETECTED (A) NOT DETECTED Final    Comment: CRITICAL RESULT CALLED TO, READ BACK BY AND VERIFIED WITH: PIA PARRIS RN 08/28/2021 _1  BY JW    Staphylococcus aureus (BCID) NOT DETECTED NOT DETECTED Final   Staphylococcus epidermidis NOT DETECTED NOT DETECTED Final   Staphylococcus lugdunensis NOT DETECTED NOT DETECTED Final   Streptococcus species NOT DETECTED NOT DETECTED Final   Streptococcus agalactiae NOT DETECTED NOT DETECTED Final   Streptococcus pneumoniae NOT DETECTED NOT DETECTED Final   Streptococcus pyogenes NOT DETECTED NOT DETECTED Final   A.calcoaceticus-baumannii NOT DETECTED NOT DETECTED Final   Bacteroides fragilis NOT DETECTED NOT DETECTED Final   Enterobacterales NOT DETECTED NOT DETECTED Final   Enterobacter cloacae complex NOT DETECTED NOT DETECTED Final   Escherichia coli NOT DETECTED NOT DETECTED Final   Klebsiella aerogenes NOT DETECTED NOT DETECTED Final   Klebsiella oxytoca NOT DETECTED NOT DETECTED Final   Klebsiella pneumoniae NOT DETECTED NOT DETECTED Final   Proteus species NOT DETECTED NOT DETECTED Final   Salmonella species NOT DETECTED NOT  DETECTED Final   Serratia marcescens NOT DETECTED NOT DETECTED Final   Haemophilus influenzae NOT DETECTED NOT DETECTED Final   Neisseria  meningitidis NOT DETECTED NOT DETECTED Final   Pseudomonas aeruginosa NOT DETECTED NOT DETECTED Final   Stenotrophomonas maltophilia NOT DETECTED NOT DETECTED Final   Candida albicans NOT DETECTED NOT DETECTED Final   Candida auris NOT DETECTED NOT DETECTED Final   Candida glabrata NOT DETECTED NOT DETECTED Final   Candida krusei NOT DETECTED NOT DETECTED Final   Candida parapsilosis NOT DETECTED NOT DETECTED Final   Candida tropicalis NOT DETECTED NOT DETECTED Final   Cryptococcus neoformans/gattii NOT DETECTED NOT DETECTED Final    Comment: Performed at [facility], [place] [address] [place], [place] [address]  Resp Panel by RT-PCR (Flu A&B, Covid) Nasopharyngeal Swab     Status: None   Collection Time: 08/26/21  6:50 PM   Specimen: Nasopharyngeal Swab; Nasopharyngeal(NP) swabs in vial transport medium  Result Value Ref Range Status   SARS Coronavirus 2 by RT PCR NEGATIVE NEGATIVE Final    Comment: (NOTE) SARS-CoV-2 target nucleic acids are NOT DETECTED.  The SARS-CoV-2 RNA is generally detectable in upper respiratory specimens during the acute phase of infection. The lowest concentration of SARS-CoV-2 viral copies this assay can detect is 138 copies/mL. A negative result does not preclude SARS-Cov-2 infection and should not be used as the sole basis for treatment or other patient management decisions. A negative result may occur with  improper specimen collection/handling, submission of specimen other than nasopharyngeal swab, presence of viral mutation(s) within the areas targeted by this assay, and inadequate number of viral copies(<138 copies/mL). A negative result must be combined with clinical observations, patient history, and epidemiological information. The expected result is Negative.  Fact Sheet for Patients:  EntrepreneurPulse.com.au  Fact Sheet for Healthcare Providers:  IncredibleEmployment.be  This test is no t yet  approved or cleared by the [place] FDA and  has been authorized for detection and/or diagnosis of SARS-CoV-2 by FDA under an Emergency Use Authorization (EUA). This EUA will remain  in effect (meaning this test can be used) for the duration of the COVID-19 declaration under Section 564(b)(1) of the Act, 21 U.S.C.section 360bbb-3(b)(1), unless the authorization is terminated  or revoked sooner.       Influenza A by PCR NEGATIVE NEGATIVE Final   Influenza B by PCR NEGATIVE NEGATIVE Final    Comment: (NOTE) The Xpert Xpress SARS-CoV-2/FLU/RSV plus assay is intended as an aid in the diagnosis of influenza from Nasopharyngeal swab specimens and should not be used as a sole basis for treatment. Nasal washings and aspirates are unacceptable for Xpert Xpress SARS-CoV-2/FLU/RSV testing.  Fact Sheet for Patients: EntrepreneurPulse.com.au  Fact Sheet for Healthcare Providers: IncredibleEmployment.be  This test is not yet approved or cleared by the [place] FDA and has been authorized for detection and/or diagnosis of SARS-CoV-2 by FDA under an Emergency Use Authorization (EUA). This EUA will remain in effect (meaning this test can be used) for the duration of the COVID-19 declaration under Section 564(b)(1) of the Act, 21 U.S.C. section 360bbb-3(b)(1), unless the authorization is terminated or revoked.  Performed at [facility], [address] [place], [place] [address]   Urine Culture     Status: Abnormal   Collection Time: 08/26/21  9:05 PM   Specimen: Urine, Clean Catch  Result Value Ref Range Status   Specimen Description   Final    URINE, CLEAN CATCH Performed at [facility], [address], [place], [place] [address]    Special Requests   Final    NONE Performed at [facility], [address] [place], [place] [address]    Culture (A)  Final    <10,000 COLONIES/mL INSIGNIFICANT GROWTH Performed at [place] [address] [place], [place] [address]    Report Status 08/28/2021 FINAL  Final  Culture, blood (routine x 2)     Status: None (Preliminary result)   Collection Time: 08/27/21  8:50 PM   Specimen: Left Antecubital; Blood  Result Value Ref Range Status   Specimen Description LEFT ANTECUBITAL  Final   Special Requests   Final    Blood Culture adequate volume BOTTLES DRAWN AEROBIC AND ANAEROBIC   Culture   Final    NO GROWTH < 12 HOURS Performed at [facility], [address], [place], [place] [address]    Report Status PENDING  Incomplete  Culture, blood (routine x 2)     Status: None (Preliminary result)   Collection Time: 08/27/21  8:53 PM   Specimen: BLOOD RIGHT HAND  Result Value Ref Range Status   Specimen Description BLOOD RIGHT HAND  Final   Special Requests   Final    Blood Culture adequate volume BOTTLES DRAWN AEROBIC AND ANAEROBIC   Culture   Final    NO GROWTH < 12 HOURS Performed at [facility], [address], [place],  [address]    Report Status PENDING  Incomplete    Radiology Reports DG Knee 1-2 Views Left  Result Date: 08/27/2021 CLINICAL DATA:  Knee pain EXAM: LEFT KNEE - 1-2 VIEW COMPARISON:  None. FINDINGS: Left knee arthroplasty. No fracture or dislocation is seen. Small to moderate suprapatellar knee joint effusion. IMPRESSION: Left knee arthroplasty. Small to moderate suprapatellar knee joint effusion. Electronically Signed   By: Julian Hy M.D.   On: 08/27/2021 01:48   CT HEAD WO CONTRAST (5MM)  Result Date: 08/27/2021 CLINICAL DATA:  Weakness, fatigue EXAM: CT HEAD WITHOUT CONTRAST TECHNIQUE: Contiguous axial images were obtained from the base of the skull through the vertex without intravenous contrast. COMPARISON:  None. FINDINGS: Brain: No evidence of acute infarction, hemorrhage, hydrocephalus, extra-axial collection or mass lesion/mass effect. Mild subcortical white matter and periventricular small vessel ischemic changes. Vascular: No  hyperdense vessel or unexpected calcification. Skull: Normal. Negative for fracture or focal lesion. Sinuses/Orbits: The visualized paranasal sinuses are essentially clear. The mastoid air cells are unopacified. Other: None. IMPRESSION: No evidence of acute intracranial abnormality. Mild small vessel ischemic changes. Electronically Signed   By: Julian Hy M.D.   On: 08/27/2021 01:50   US Venous Img Lower Bilateral (DVT)  Result Date: 08/28/2021 CLINICAL DATA:  Bilateral lower extremity pain and edema. Recent fall. History of DVT and pulmonary embolism. Patient is currently on anticoagulation. Evaluate for acute chronic DVT. EXAM: BILATERAL LOWER EXTREMITY VENOUS DOPPLER ULTRASOUND TECHNIQUE: Gray-scale sonography with graded compression, as well as color Doppler and duplex ultrasound were performed to evaluate the lower extremity deep venous systems from the level of the common femoral vein and including the common femoral, femoral, profunda femoral, popliteal and calf veins including the posterior tibial, peroneal and gastrocnemius veins when visible. The superficial great saphenous vein was also interrogated. Spectral Doppler was utilized to evaluate flow at rest and with distal augmentation maneuvers in the common femoral, femoral and popliteal veins. COMPARISON:  None. FINDINGS: Examination is degraded due to patient body habitus and poor sonographic window. RIGHT LOWER EXTREMITY Common Femoral  Vein: No evidence of thrombus. Normal compressibility, respiratory phasicity and response to augmentation. Saphenofemoral Junction: No evidence of thrombus. Normal compressibility and flow on color Doppler imaging. Profunda Femoral Vein: No evidence of thrombus. Normal compressibility and flow on color Doppler imaging. Femoral Vein: No evidence of thrombus. Normal compressibility, respiratory phasicity and response to augmentation. Popliteal Vein: No evidence of thrombus. Normal compressibility, respiratory  phasicity and response to augmentation. Calf Veins: No evidence of thrombus. Normal compressibility and flow on color Doppler imaging. Superficial Great Saphenous Vein: No evidence of thrombus. Normal compressibility. Venous Reflux:  None. Other Findings:  None. LEFT LOWER EXTREMITY Common Femoral Vein: No evidence of thrombus. Normal compressibility, respiratory phasicity and response to augmentation. Saphenofemoral Junction: No evidence of thrombus. Normal compressibility and flow on color Doppler imaging. Profunda Femoral Vein: No evidence of thrombus. Normal compressibility and flow on color Doppler imaging. Femoral Vein: No evidence of thrombus. Normal compressibility, respiratory phasicity and response to augmentation. Popliteal Vein: No evidence of thrombus. Normal compressibility, respiratory phasicity and response to augmentation. Calf Veins: No evidence of thrombus. Normal compressibility and flow on color Doppler imaging. Superficial Great Saphenous Vein: No evidence of thrombus. Normal compressibility. Venous Reflux:  None. Other Findings:  None. IMPRESSION: No evidence of acute or chronic DVT within either lower extremity. Electronically Signed   By: Sandi Mariscal M.D.   On: 08/28/2021 09:29   DG Chest Port 1 View  Result Date: 08/26/2021 CLINICAL DATA:  Sepsis EXAM: PORTABLE CHEST 1 VIEW COMPARISON:  03/21/2021 FINDINGS: Chronic interstitial thickening is unchanged. Lungs are otherwise clear. No pneumothorax or pleural effusion. Cardiac size within normal limits. Pulmonary vascularity is normal. Osseous structures are age-appropriate. No acute bone abnormality. IMPRESSION: No active disease. Electronically Signed   By: Fidela Salisbury M.D.   On: 08/26/2021 19:08    Lab Data:  CBC: Recent Labs  Lab 08/26/21 1838 08/27/21 0335 08/28/21 0501 08/29/21 0512  WBC 14.3* 8.6 6.7 5.5  NEUTROABS 12.6* 7.0  --   --   HGB 13.4 11.5* 11.2* 10.7*  HCT 42.6 37.1 35.8* 34.8*  MCV 91.4 93.2 93.0 92.1   PLT 126* DCLMP 104* 233*   Basic Metabolic Panel: Recent Labs  Lab 08/26/21 1838 08/27/21 0335 08/28/21 0501 08/29/21 0512  NA 136 138 136 138  K 4.3 3.6 3.7 4.2  CL 100 106 105 105  CO2 _0 GLUCOSE 159* 146* 124* 111*  BUN _1 CREATININE 0.76 0.61 0.53 0.49  CALCIUM 9.2 8.3* 8.0* 8.1*  MG  --  1.8  --   --    GFR: Estimated Creatinine Clearance: 73 mL/min (by C-G formula based on SCr of 0.49 mg/dL). Liver Function Tests: Recent Labs  Lab 08/26/21 1838 08/27/21 0335  AST 28 32  ALT 20 18  ALKPHOS 57 48  BILITOT 0.8 0.6  PROT 7.7 6.4*  ALBUMIN 4.0 3.2*   No results for input(s): LIPASE, AMYLASE in the last 168 hours. No results for input(s): AMMONIA in the last 168 hours. Coagulation Profile: Recent Labs  Lab 08/26/21 1838 08/27/21 0335 08/28/21 0501 08/29/21 0512  INR 1.8* 1.9* 2.0* 2.1*   Cardiac Enzymes: No results for input(s): CKTOTAL, CKMB, CKMBINDEX, TROPONINI in the last 168 hours. BNP (last 3 results) No results for input(s): PROBNP in the last 8760 hours. HbA1C: Recent Labs    08/27/21 0335  HGBA1C 6.2*   CBG: Recent Labs  Lab 08/28/21 1116 08/28/21 1119 08/28/21 1644 08/28/21 2125 08/29/21 0756  GLUCAP 147* 146* 93 145* 99   Lipid Profile: No results for input(s): CHOL, HDL, LDLCALC, TRIG, CHOLHDL, LDLDIRECT in the last 72 hours. Thyroid Function Tests: No results for input(s): TSH, T4TOTAL, FREET4, T3FREE, THYROIDAB in the last 72 hours. Anemia Panel: No results for input(s): VITAMINB12, FOLATE, FERRITIN, TIBC, IRON, RETICCTPCT in the last 72 hours. Urine analysis:    Component Value Date/Time   COLORURINE AMBER (A) 08/26/2021 2105   APPEARANCEUR TURBID (A) 08/26/2021 2105   LABSPEC 1.019 08/26/2021 2105   PHURINE 8.0 08/26/2021 2105   GLUCOSEU NEGATIVE 08/26/2021 2105   HGBUR SMALL (A) 08/26/2021 2105   BILIRUBINUR NEGATIVE 08/26/2021 2105   KETONESUR 5 (A) 08/26/2021 2105   PROTEINUR 100 (A) 08/26/2021  2105   UROBILINOGEN 0.2 01/04/2011 0915   NITRITE POSITIVE (A) 08/26/2021 2105   LEUKOCYTESUR LARGE (A) 08/26/2021 2105     Gabrial Domine M.D. [facility] 08/29/2021, 12:16 PM  Available via Epic secure chat 7am-7pm After 7 pm, please refer to night coverage provider listed on amion.

## 2021-08-29 NOTE — Progress Notes (Signed)
ANTICOAGULATION CONSULT NOTE - Initial Consult  Pharmacy Consult for warfarin Indication: atrial fibrillation and hx DVT  Allergies  Allergen Reactions   Bactrim [Sulfamethoxazole-Trimethoprim]     Drop in BP   Morphine And Related Other (See Comments)    Unresponsive-Very bad reaction.  Can take hydrocodone.    Oysters [Shellfish Allergy] Shortness Of Breath, Itching, Swelling and Rash   Amlodipine Swelling and Other (See Comments)    Of legs   Percocet [Oxycodone-Acetaminophen] Other (See Comments)    hallucinations   Tdap [Tetanus-Diphth-Acell Pertussis] Itching, Swelling and Rash   Tetanus Toxoids Itching, Swelling and Rash   Tramadol Swelling and Other (See Comments)    Of legs   Carbamazepine Other (See Comments)   Lactose Intolerance (Gi)    Macrodantin [Nitrofurantoin] Other (See Comments)   Tetanus-Diphtheria Toxoids Td Other (See Comments)   Topiramate Other (See Comments)   Diovan [Valsartan] Other (See Comments)    Cough   Diphtheria Toxoid Itching, Swelling and Rash   Macrodantin [Nitrofurantoin Macrocrystal] Other (See Comments)    unspecified    Patient Measurements: Height: 5' 3"  (160 cm) Weight: 127.7 kg (281 lb 9.6 oz) IBW/kg (Calculated) : 52.4 Heparin Dosing Weight:   Vital Signs: Temp: 98.4 F (36.9 C) (11/16 0604) Temp Source: Oral (11/16 0604) BP: 143/80 (11/16 0754) Pulse Rate: 106 (11/16 0754)  Labs: Recent Labs    08/26/21 1838 08/27/21 0335 08/28/21 0501 08/29/21 0512  HGB 13.4 11.5* 11.2* 10.7*  HCT 42.6 37.1 35.8* 34.8*  PLT 126* DCLMP 104* 121*  APTT 34  --   --   --   LABPROT 20.9* 21.7* 22.8* 23.8*  INR 1.8* 1.9* 2.0* 2.1*  CREATININE 0.76 0.61 0.53 0.49    Estimated Creatinine Clearance: 73 mL/min (by C-G formula based on SCr of 0.49 mg/dL).   Medical History: Past Medical History:  Diagnosis Date   Anemia    Arthritis    DVT (deep venous thrombosis) ([place]) 01/2009   GERD (gastroesophageal reflux disease)    Gout     History of blood transfusion    1990's   History of colonic polyps 09/2010   History of pneumonia    Hyperlipidemia    Hypertension    Lactose intolerance    Macular degeneration, dry    bilateral   Migraine    Obesity    Osteopenia    PAF (paroxysmal atrial fibrillation) (HCC)    Peripheral neuropathy    Pulmonary embolism ([place]) 01/2009   Sleep apnea    CPAP pressure 4.0-16.0   Thoracic ascending aortic aneurysm    Type II diabetes mellitus (HCC)    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   08/25/2021   allopurinol (ZYLOPRIM) 300 MG tablet Take 150 mg by mouth every evening.    08/25/2021   Cholecalciferol (VITAMIN D3) 25 MCG (1000 UT) CAPS Take 3 capsules by mouth daily in the afternoon.   08/26/2021   clobetasol cream (TEMOVATE) 4.26 % Apply 1 application topically as needed.   Past Month   dofetilide (TIKOSYN) 250 MCG capsule TAKE 1 CAPSULE BY MOUTH TWICE DAILY 180 capsule 3 08/26/2021   furosemide (LASIX) 40 MG tablet Take 60 mg by mouth daily.   08/26/2021   HYDROcodone-acetaminophen (NORCO/VICODIN) 5-325 MG tablet Take 1 tablet by mouth as needed.  unknown   Hyprom-Naphaz-Polysorb-Zn Sulf (CLEAR EYES COMPLETE) SOLN Place 2 drops into both eyes 2 (two) times daily.   08/25/2021   losartan (COZAAR) 25 MG tablet Take 25 mg by mouth daily.   08/26/2021   magnesium oxide (MAG-OX) 400 MG tablet Take 1 tablet in the AM and 1/2 tablet in the PM   08/26/2021   metFORMIN (GLUCOPHAGE) 500 MG tablet Take 500 mg by mouth 2 (two) times daily with a meal.    08/26/2021   metoprolol tartrate (LOPRESSOR) 25 MG tablet Take 25-50 mg by mouth See admin instructions. Take 2 tablets in the morning, and 2 tablets in the evening.   08/26/2021 at 0900   Multiple Vitamins-Minerals (PRESERVISION AREDS 2)  CAPS Take 2 capsules by mouth daily.   08/26/2021   potassium chloride SA (K-DUR,KLOR-CON) 20 MEQ tablet Take 1.5 tablets (30 mEq total) by mouth daily. 30 tablet 6 08/26/2021   warfarin (COUMADIN) 5 MG tablet Take 5 mg by mouth daily. 5 mg daily except for Monday take 1/2 tablet   08/25/2021 at 1700   ONETOUCH ULTRA test strip       SSD 1 % cream Apply topically 2 (two) times daily. (Patient not taking: No sig reported)   Not Taking   vitamin B-12 (CYANOCOBALAMIN) 500 MCG tablet Take 500 mcg by mouth daily.       Assessment: Pharmacy consulted to dose warfarin in patient with afib/hx DVT. Home dose listed as 2.5 mg on Mon and 5 mg ROW.  IN 2.1    Goal of Therapy:  INR 2-3 Monitor platelets by anticoagulation protocol: Yes   Plan:  Warfarin 5 mg daily. Monitor daily INR and s/s of bleeding.  Margot Ables, PharmD Clinical Pharmacist 08/29/2021 12:28 PM

## 2021-08-29 NOTE — TOC Progression Note (Signed)
Transition of Care [facility] - Progression Note    Patient Details  Name: Natasha Glass MRN: [id number] Date of Birth: 1941/03/28  Transition of [facility]) CM/SW Contact  Shade Flood, LCSW Phone Number: 08/29/2021, 10:38 AM  Clinical Narrative:     TOC following. Pt now agreeable to SNF rehab and would like to go to [facility] Will refer and follow up with pt on whether they can offer her a bed or not.  Expected Discharge Plan: [place] Barriers to Discharge: Continued Medical Work up  Expected Discharge Plan and Services Expected Discharge Plan: [place] In-house Referral: Clinical Social Work   Post Acute Care Choice: [place] arrangements for the past 2 months: Single Family Home                 DME Arranged: N/A         HH Arranged: RN, PT, OT HH Agency: [place] Date [place]: 08/27/21 Time [place]: 3329 Representative spoke with at [place]: [place] ([place]) Interventions    Readmission Risk Interventions No flowsheet data found.

## 2021-08-29 NOTE — Patient Instructions (Signed)

## 2021-08-29 NOTE — NC FL2 (Signed)
[place] LEVEL OF CARE SCREENING TOOL     IDENTIFICATION  Patient Name: Natasha Glass Birthdate: 01/12/1941 Sex: female Admission Date (Current Location): 08/26/2021  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   Pain in joint of right shoulder 09/22/2018   Persistent atrial fibrillation ([place]) 08/18/2018   PAF (paroxysmal atrial fibrillation) ([place])    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 ([place]) 01/19/2018   Acute respiratory failure with hypoxia ([place]) 04/11/2016   Acute lower UTI 04/10/2016   Generalized weakness 04/10/2016   Nausea 04/10/2016   Diabetes ([place]) 04/10/2016   Diabetes mellitus with complication (HCC)    Ascending aortic aneurysm ([place]) 05/18/2014   Ulcerative colitis ([place]) 05/07/2012   Obesity 05/07/2012   Hypertension 05/07/2012   History of pulmonary embolism 05/07/2012    Orientation RESPIRATION BLADDER Height & Weight     [place], Time, Situation, Place  Normal Incontinent Weight: 281 lb 9.6 oz (127.7 kg) Height:  5' 3"  (160 cm)  BEHAVIORAL SYMPTOMS/MOOD NEUROLOGICAL BOWEL NUTRITION STATUS      Continent Diet (see dc summary)  AMBULATORY STATUS COMMUNICATION OF NEEDS Skin   Extensive Assist Verbally Normal                       Personal Care Assistance  Level of Assistance  Bathing, Feeding, Dressing Bathing Assistance: Limited assistance Feeding assistance: Independent Dressing Assistance: Limited assistance     Functional Limitations Info  Sight, Hearing, Speech Sight Info: Adequate Hearing Info: Adequate Speech Info: Adequate    SPECIAL CARE FACTORS FREQUENCY  PT (By licensed PT), OT (By licensed OT)     PT Frequency: 5x week OT Frequency: 3x week            Contractures Contractures Info: Not present    Additional Factors Info  Code Status, Allergies Code Status Info: Full Allergies Info: Bactrim (Sulfamethoxazole-trimethoprim), Morphine And Related, Oysters (Shellfish Allergy), Amlodipine, Percocet (Oxycodone-acetaminophen), Tdap (Tetanus-diphth-acell Pertussis), Tetanus Toxoids, Tramadol, Carbamazepine, Lactose Intolerance (Gi), Macrodantin (Nitrofurantoin), Tetanus-diphtheria Toxoids Td, Topiramate, Diovan (Valsartan), Diphtheria Toxoid, Macrodantin (Nitrofurantoin Macrocrystal)           Current Medications (08/29/2021):  This is the current hospital active medication list Current Facility-Administered Medications  Medication Dose Route Frequency Provider Last Rate Last Admin   acetaminophen (TYLENOL) tablet 650 mg  650 mg Oral Q6H PRN Zierle-Ghosh, Asia B, DO   650 mg at 08/28/21 1752   Or   acetaminophen (TYLENOL) suppository 650 mg  650 mg Rectal Q6H PRN Zierle-Ghosh, Asia B, DO       cefTRIAXone (ROCEPHIN) 2 g in sodium chloride 0.9 % 100 mL IVPB  2 g Intravenous Q24H Zierle-Ghosh, Asia B, DO 200 mL/hr at 08/28/21 1518 2 g at 08/28/21 1518   diltiazem (CARDIZEM) tablet 30 mg  30  mg Oral Q6H PRN Satira Sark, MD   30 mg at 08/27/21 1828   dofetilide (TIKOSYN) capsule 250 mcg  250 mcg Oral BID Zierle-Ghosh, Asia B, DO   250 mcg at 08/28/21 2103   furosemide (LASIX) injection 40 mg  40 mg Intravenous Once Rai, Vernelle Emerald, MD       [START ON 08/30/2021] furosemide (LASIX) tablet 40 mg  40 mg Oral Daily Rai,  Ripudeep K, MD       HYDROcodone-acetaminophen (NORCO/VICODIN) 5-325 MG per tablet 1 tablet  1 tablet Oral Q6H PRN Zierle-Ghosh, Asia B, DO   1 tablet at 08/29/21 0743   insulin aspart (novoLOG) injection 0-15 Units  0-15 Units Subcutaneous TID WC Zierle-Ghosh, Asia B, DO   2 Units at 08/28/21 1141   magnesium oxide (MAG-OX) tablet 800 mg  800 mg Oral Daily Satira Sark, MD   800 mg at 08/29/21 0801   metoprolol tartrate (LOPRESSOR) tablet 75 mg  75 mg Oral BID Satira Sark, MD       nystatin ointment (MYCOSTATIN)   Topical BID Deatra James, MD   Given at 08/29/21 0801   ondansetron (ZOFRAN) tablet 4 mg  4 mg Oral Q6H PRN Zierle-Ghosh, Asia B, DO       Or   ondansetron (ZOFRAN) injection 4 mg  4 mg Intravenous Q6H PRN Zierle-Ghosh, Asia B, DO       potassium chloride SA (KLOR-CON) CR tablet 40 mEq  40 mEq Oral Daily Satira Sark, MD   40 mEq at 08/29/21 0800   vitamin B-12 (CYANOCOBALAMIN) tablet 500 mcg  500 mcg Oral Daily Zierle-Ghosh, Asia B, DO   500 mcg at 08/29/21 0800   warfarin (COUMADIN) tablet 5 mg  5 mg Oral q1600 Zierle-Ghosh, Asia B, DO   5 mg at 08/28/21 1521   Warfarin - Physician Dosing Inpatient   Does not apply q1600 Zierle-Ghosh, [place] B, DO         Discharge Medications: Please see discharge summary for a list of discharge medications.  Relevant Imaging Results:  Relevant Lab Results:   Additional Information SSN: [id number], [place]

## 2021-08-29 NOTE — Progress Notes (Signed)
Progress Note  Patient Name: Natasha Glass Date of Encounter: 08/29/2021  Primary Cardiologist: Dr. Thompson Grayer  Subjective   Sitting in bedside chair.  No palpitations or shortness of breath at rest.  Legs are feeling better.  Inpatient Medications    Scheduled Meds:  dofetilide  250 mcg Oral BID   insulin aspart  0-15 Units Subcutaneous TID WC   magnesium oxide  800 mg Oral Daily   metoprolol tartrate  50 mg Oral BID   nystatin ointment   Topical BID   potassium chloride  40 mEq Oral Daily   vitamin B-12  500 mcg Oral Daily   warfarin  5 mg Oral q1600   Warfarin - Physician Dosing Inpatient   Does not apply q1600   Continuous Infusions:  cefTRIAXone (ROCEPHIN)  IV 2 g (08/28/21 1518)   PRN Meds: acetaminophen **OR** acetaminophen, diltiazem, HYDROcodone-acetaminophen, ondansetron **OR** ondansetron (ZOFRAN) IV   Vital Signs    Vitals:   08/28/21 1325 08/28/21 2123 08/29/21 0604 08/29/21 0754  BP: 103/67 121/76 126/69 (!) 143/80  Pulse: 84 92 (!) 102 (!) 106  Resp: 17 19 20    Temp: 98.4 F (36.9 C) 98 F (36.7 C) 98.4 F (36.9 C)   TempSrc: Oral Oral Oral   SpO2: 97% 97% 97% 97%  Weight:      Height:        Intake/Output Summary (Last 24 hours) at 08/29/2021 0855 Last data filed at 08/29/2021 0501 Gross per 24 hour  Intake 480 ml  Output 700 ml  Net -220 ml   Filed Weights   08/26/21 1800 08/27/21 0829  Weight: 128.4 kg 127.7 kg    Telemetry    Atrial fibrillation with heart rate 100-110 recently.  Personally reviewed.  ECG    An ECG dated 08/28/2021 was personally reviewed today and demonstrated:  Atrial fibrillation with QTc 454 ms.  Physical Exam   GEN: No acute distress.   Neck: No JVD. Cardiac: Irregularly irregular, 1/6 systolic murmur. Respiratory: Nonlabored.  Clear to auscultation. GI: Soft, nontender, bowel sounds present. MS: Erythema right lower extremity with lymphedema. Neuro:  Nonfocal. Psych: Alert and oriented x 3.  Normal affect.  Labs    Chemistry Recent Labs  Lab 08/26/21 1838 08/27/21 0335 08/28/21 0501 08/29/21 0512  NA 136 138 136 138  K 4.3 3.6 3.7 4.2  CL 100 106 105 105  CO2 24 26 27 26   GLUCOSE 159* 146* 124* 111*  BUN 23 20 14 11   CREATININE 0.76 0.61 0.53 0.49  CALCIUM 9.2 8.3* 8.0* 8.1*  PROT 7.7 6.4*  --   --   ALBUMIN 4.0 3.2*  --   --   AST 28 32  --   --   ALT 20 18  --   --   ALKPHOS 57 48  --   --   BILITOT 0.8 0.6  --   --   GFRNONAA >60 >60 >60 >60  ANIONGAP 12 6 4* 7     Hematology Recent Labs  Lab 08/27/21 0335 08/28/21 0501 08/29/21 0512  WBC 8.6 6.7 5.5  RBC 3.98 3.85* 3.78*  HGB 11.5* 11.2* 10.7*  HCT 37.1 35.8* 34.8*  MCV 93.2 93.0 92.1  MCH 28.9 29.1 28.3  MCHC 31.0 31.3 30.7  RDW 17.4* 17.5* 17.2*  PLT DCLMP 104* 121*    Radiology    US Venous Img Lower Bilateral (DVT)  Result Date: 08/28/2021 CLINICAL DATA:  Bilateral lower extremity pain and edema. Recent  fall. History of DVT and pulmonary embolism. Patient is currently on anticoagulation. Evaluate for acute chronic DVT. EXAM: BILATERAL LOWER EXTREMITY VENOUS DOPPLER ULTRASOUND TECHNIQUE: Gray-scale sonography with graded compression, as well as color Doppler and duplex ultrasound were performed to evaluate the lower extremity deep venous systems from the level of the common femoral vein and including the common femoral, femoral, profunda femoral, popliteal and calf veins including the posterior tibial, peroneal and gastrocnemius veins when visible. The superficial great saphenous vein was also interrogated. Spectral Doppler was utilized to evaluate flow at rest and with distal augmentation maneuvers in the common femoral, femoral and popliteal veins. COMPARISON:  None. FINDINGS: Examination is degraded due to patient body habitus and poor sonographic window. RIGHT LOWER EXTREMITY Common Femoral Vein: No evidence of thrombus. Normal compressibility, respiratory phasicity and response to  augmentation. Saphenofemoral Junction: No evidence of thrombus. Normal compressibility and flow on color Doppler imaging. Profunda Femoral Vein: No evidence of thrombus. Normal compressibility and flow on color Doppler imaging. Femoral Vein: No evidence of thrombus. Normal compressibility, respiratory phasicity and response to augmentation. Popliteal Vein: No evidence of thrombus. Normal compressibility, respiratory phasicity and response to augmentation. Calf Veins: No evidence of thrombus. Normal compressibility and flow on color Doppler imaging. Superficial Great Saphenous Vein: No evidence of thrombus. Normal compressibility. Venous Reflux:  None. Other Findings:  None. LEFT LOWER EXTREMITY Common Femoral Vein: No evidence of thrombus. Normal compressibility, respiratory phasicity and response to augmentation. Saphenofemoral Junction: No evidence of thrombus. Normal compressibility and flow on color Doppler imaging. Profunda Femoral Vein: No evidence of thrombus. Normal compressibility and flow on color Doppler imaging. Femoral Vein: No evidence of thrombus. Normal compressibility, respiratory phasicity and response to augmentation. Popliteal Vein: No evidence of thrombus. Normal compressibility, respiratory phasicity and response to augmentation. Calf Veins: No evidence of thrombus. Normal compressibility and flow on color Doppler imaging. Superficial Great Saphenous Vein: No evidence of thrombus. Normal compressibility. Venous Reflux:  None. Other Findings:  None. IMPRESSION: No evidence of acute or chronic DVT within either lower extremity. Electronically Signed   By: Sandi Mariscal M.D.   On: 08/28/2021 09:29    Assessment & Plan    1.  Paroxysmal to persistent atrial fibrillation with CHA2DS2-VASc score of 5.  She remains in atrial fibrillation, asymptomatic at this time.  Continues on Tikosyn, Lopressor, and as needed short acting Cardizem.  Also on Coumadin for stroke prophylaxis per pharmacy with INR  2.1.  2.  Essential hypertension.  Blood pressure is normal, losartan remains on hold.  3.  Sepsis due to UTI and lower extremity cellulitis, management per primary team.  Continue Tikosyn at present dose, increase Lopressor to 75 mg twice daily.  Short acting Cardizem still available as needed.  Continue Coumadin per pharmacy.  Continue to track electrolytes while hospitalized, will order BMET and magnesium for the morning along with ECG.  QTc 457 ms today.  If she is still in atrial fibrillation at time of appropriate discharge per primary team from the perspective of her infection treatment, we will set up follow-up in atrial fibrillation clinic.  If she does not convert in the meanwhile, elective cardioversion can be set up as an outpatient.  Signed, Rozann Lesches, MD  08/29/2021, 8:55 AM

## 2021-08-30 ENCOUNTER — Inpatient Hospital Stay
Admission: RE | Admit: 2021-08-30 | Discharge: 2021-09-15 | Disposition: A | Discharge status: Home / Self Care | Payer: Medicare Other | Source: Ambulatory Visit | Attending: Internal Medicine | Admitting: Internal Medicine

## 2021-08-30 ENCOUNTER — Encounter: Payer: Self-pay | Admitting: Adult Health

## 2021-08-30 ENCOUNTER — Encounter (INDEPENDENT_AMBULATORY_CARE_PROVIDER_SITE_OTHER): Payer: Medicare Other | Admitting: Ophthalmology

## 2021-08-30 ENCOUNTER — Non-Acute Institutional Stay (SKILLED_NURSING_FACILITY): Payer: Medicare Other | Admitting: Adult Health

## 2021-08-30 DIAGNOSIS — E1159 Type 2 diabetes mellitus with other circulatory complications: Secondary | ICD-10-CM

## 2021-08-30 DIAGNOSIS — I480 Paroxysmal atrial fibrillation: Secondary | ICD-10-CM | POA: Diagnosis not present

## 2021-08-30 DIAGNOSIS — D638 Anemia in other chronic diseases classified elsewhere: Secondary | ICD-10-CM

## 2021-08-30 DIAGNOSIS — I5032 Chronic diastolic (congestive) heart failure: Secondary | ICD-10-CM | POA: Diagnosis not present

## 2021-08-30 DIAGNOSIS — K5190 Ulcerative colitis, unspecified, without complications: Secondary | ICD-10-CM | POA: Diagnosis not present

## 2021-08-30 DIAGNOSIS — M5136 Other intervertebral disc degeneration, lumbar region: Secondary | ICD-10-CM

## 2021-08-30 DIAGNOSIS — E118 Type 2 diabetes mellitus with unspecified complications: Secondary | ICD-10-CM | POA: Diagnosis not present

## 2021-08-30 DIAGNOSIS — R2242 Localized swelling, mass and lump, left lower limb: Secondary | ICD-10-CM | POA: Diagnosis not present

## 2021-08-30 DIAGNOSIS — D696 Thrombocytopenia, unspecified: Secondary | ICD-10-CM | POA: Diagnosis not present

## 2021-08-30 DIAGNOSIS — I700 Atherosclerosis of aorta: Secondary | ICD-10-CM | POA: Diagnosis not present

## 2021-08-30 DIAGNOSIS — M1A00X0 Idiopathic chronic gout, unspecified site, without tophus (tophi): Secondary | ICD-10-CM

## 2021-08-30 DIAGNOSIS — R262 Difficulty in walking, not elsewhere classified: Secondary | ICD-10-CM | POA: Diagnosis not present

## 2021-08-30 DIAGNOSIS — I152 Hypertension secondary to endocrine disorders: Secondary | ICD-10-CM | POA: Diagnosis not present

## 2021-08-30 DIAGNOSIS — R600 Localized edema: Secondary | ICD-10-CM

## 2021-08-30 DIAGNOSIS — D6869 Other thrombophilia: Secondary | ICD-10-CM

## 2021-08-30 DIAGNOSIS — I4819 Other persistent atrial fibrillation: Secondary | ICD-10-CM | POA: Diagnosis not present

## 2021-08-30 DIAGNOSIS — M1A9XX0 Chronic gout, unspecified, without tophus (tophi): Secondary | ICD-10-CM | POA: Insufficient documentation

## 2021-08-30 DIAGNOSIS — M6281 Muscle weakness (generalized): Secondary | ICD-10-CM | POA: Diagnosis not present

## 2021-08-30 DIAGNOSIS — L03115 Cellulitis of right lower limb: Secondary | ICD-10-CM | POA: Diagnosis not present

## 2021-08-30 DIAGNOSIS — M79672 Pain in left foot: Secondary | ICD-10-CM | POA: Diagnosis not present

## 2021-08-30 DIAGNOSIS — Z7901 Long term (current) use of anticoagulants: Secondary | ICD-10-CM | POA: Diagnosis not present

## 2021-08-30 DIAGNOSIS — Z741 Need for assistance with personal care: Secondary | ICD-10-CM | POA: Diagnosis not present

## 2021-08-30 DIAGNOSIS — M109 Gout, unspecified: Secondary | ICD-10-CM | POA: Insufficient documentation

## 2021-08-30 DIAGNOSIS — I10 Essential (primary) hypertension: Secondary | ICD-10-CM | POA: Insufficient documentation

## 2021-08-30 DIAGNOSIS — Z9181 History of falling: Secondary | ICD-10-CM | POA: Diagnosis not present

## 2021-08-30 DIAGNOSIS — R2681 Unsteadiness on feet: Secondary | ICD-10-CM | POA: Diagnosis not present

## 2021-08-30 DIAGNOSIS — E1169 Type 2 diabetes mellitus with other specified complication: Secondary | ICD-10-CM | POA: Insufficient documentation

## 2021-08-30 DIAGNOSIS — N3001 Acute cystitis with hematuria: Secondary | ICD-10-CM | POA: Diagnosis not present

## 2021-08-30 DIAGNOSIS — N390 Urinary tract infection, site not specified: Secondary | ICD-10-CM | POA: Diagnosis not present

## 2021-08-30 DIAGNOSIS — I7121 Aneurysm of the ascending aorta, without rupture: Secondary | ICD-10-CM | POA: Diagnosis not present

## 2021-08-30 DIAGNOSIS — R6520 Severe sepsis without septic shock: Secondary | ICD-10-CM

## 2021-08-30 DIAGNOSIS — E1165 Type 2 diabetes mellitus with hyperglycemia: Secondary | ICD-10-CM | POA: Diagnosis not present

## 2021-08-30 DIAGNOSIS — M25572 Pain in left ankle and joints of left foot: Secondary | ICD-10-CM | POA: Diagnosis not present

## 2021-08-30 DIAGNOSIS — A419 Sepsis, unspecified organism: Secondary | ICD-10-CM

## 2021-08-30 LAB — CBC
HCT: 35.7 % — ABNORMAL LOW (ref 36.0–46.0)
Hemoglobin: 11 g/dL — ABNORMAL LOW (ref 12.0–15.0)
MCH: 28.9 pg (ref 26.0–34.0)
MCHC: 30.8 g/dL (ref 30.0–36.0)
MCV: 93.9 fL (ref 80.0–100.0)
Platelets: 160 10*3/uL (ref 150–400)
RBC: 3.8 MIL/uL — ABNORMAL LOW (ref 3.87–5.11)
RDW: 17 % — ABNORMAL HIGH (ref 11.5–15.5)
WBC: 7.5 10*3/uL (ref 4.0–10.5)
nRBC: 0 % (ref 0.0–0.2)

## 2021-08-30 LAB — BASIC METABOLIC PANEL
Anion gap: 6 (ref 5–15)
BUN: 14 mg/dL (ref 8–23)
CO2: 27 mmol/L (ref 22–32)
Calcium: 8.4 mg/dL — ABNORMAL LOW (ref 8.9–10.3)
Chloride: 105 mmol/L (ref 98–111)
Creatinine, Ser: 0.53 mg/dL (ref 0.44–1.00)
GFR, Estimated: >60 mL/min (ref >60)
Glucose, Bld: 113 mg/dL — ABNORMAL HIGH (ref 70–99)
Potassium: 4.1 mmol/L (ref 3.5–5.1)
Sodium: 138 mmol/L (ref 135–145)

## 2021-08-30 LAB — GLUCOSE, CAPILLARY
Glucose-Capillary: 103 mg/dL — ABNORMAL HIGH (ref 70–99)
Glucose-Capillary: 105 mg/dL — ABNORMAL HIGH (ref 70–99)

## 2021-08-30 LAB — CULTURE, BLOOD (ROUTINE X 2)

## 2021-08-30 LAB — PROTIME-INR
INR: 2.3 — ABNORMAL HIGH (ref 0.8–1.2)
Prothrombin Time: 25.1 seconds — ABNORMAL HIGH (ref 11.4–15.2)

## 2021-08-30 MED ORDER — HYDROCODONE-ACETAMINOPHEN 5-325 MG PO TABS: 1.0000 | ORAL_TABLET | Freq: Four times a day (QID) | ORAL | 0 refills | Status: DC | PRN

## 2021-08-30 MED ORDER — METOPROLOL TARTRATE 50 MG PO TABS
100.0000 mg | ORAL_TABLET | Freq: Two times a day (BID) | ORAL | Status: DC
  Administered 2021-08-30: 10:00:00 100 mg via ORAL

## 2021-08-30 MED ORDER — CEPHALEXIN 500 MG PO CAPS: 500.0000 mg | ORAL_CAPSULE | Freq: Four times a day (QID) | ORAL | 0 refills | Status: AC

## 2021-08-30 MED ORDER — METOPROLOL TARTRATE 100 MG PO TABS: 100.0000 mg | ORAL_TABLET | Freq: Two times a day (BID) | ORAL | Status: DC

## 2021-08-30 MED ORDER — FUROSEMIDE 40 MG PO TABS
60.0000 mg | ORAL_TABLET | Freq: Every day | ORAL | Status: DC
  Administered 2021-08-30: 10:00:00 60 mg via ORAL

## 2021-08-30 MED ORDER — WARFARIN - PHARMACIST DOSING INPATIENT: Freq: Every day | Status: DC

## 2021-08-30 MED ORDER — DILTIAZEM HCL 30 MG PO TABS: 30.0000 mg | ORAL_TABLET | Freq: Four times a day (QID) | ORAL | Status: DC | PRN

## 2021-08-30 MED ORDER — WARFARIN SODIUM 5 MG PO TABS: 5.0000 mg | ORAL_TABLET | Freq: Once | ORAL | Status: DC

## 2021-08-30 NOTE — Discharge Summary (Signed)
Physician Discharge Summary   Patient ID: Natasha Glass MRN: [id number] DOB/AGE: [birth year] 80 y.o.  Admit date: 08/26/2021 Discharge date: 08/30/2021  Primary Care Physician:  Josetta Huddle, MD   Recommendations for Outpatient Follow-up:  Follow up with PCP in 1-2 weeks Patient needs to follow-up with A. fib clinic, Dr. Rayann Heman within next 1 to 2 weeks.  Home Health: Patient going to skilled nursing facility Equipment/Devices:   Discharge Condition: stable  CODE STATUS: FULL  Diet recommendation: Carb modified diet   Discharge Diagnoses:   Present on Admission:  Sepsis ([place]), present on admission   Right lower extremity cellulitis  Paroxysmal to persistent atrial fibrillation   Chronic diastolic CHF   Obesity  Hypertension  History of pulmonary embolism  Diabetes mellitus type II, NIDDM, uncontrolled ([place])  Acute metabolic encephalopathy, resolved  Generalized debility with falls   Consults: Cardiology    Allergies:   Allergies  Allergen Reactions   Bactrim [Sulfamethoxazole-Trimethoprim]     Drop in BP   Morphine And Related Other (See Comments)    Unresponsive-Very bad reaction.  Can take hydrocodone.    Oysters [Shellfish Allergy] Shortness Of Breath, Itching, Swelling and Rash   Amlodipine Swelling and Other (See Comments)    Of legs   Percocet [Oxycodone-Acetaminophen] Other (See Comments)    hallucinations   Tdap [Tetanus-Diphth-Acell Pertussis] Itching, Swelling and Rash   Tetanus Toxoids Itching, Swelling and Rash   Tramadol Swelling and Other (See Comments)    Of legs   Carbamazepine Other (See Comments)   Lactose Intolerance (Gi)    Macrodantin [Nitrofurantoin] Other (See Comments)   Tetanus-Diphtheria Toxoids Td Other (See Comments)   Topiramate Other (See Comments)   Diovan [Valsartan] Other (See Comments)    Cough   Diphtheria Toxoid Itching, Swelling and Rash   Macrodantin [Nitrofurantoin Macrocrystal] Other (See Comments)     unspecified     DISCHARGE MEDICATIONS: Allergies as of 08/30/2021       Reactions   Bactrim [sulfamethoxazole-trimethoprim]    Drop in BP   Morphine And Related Other (See Comments)   Unresponsive-Very bad reaction.  Can take hydrocodone.    Oysters [shellfish Allergy] Shortness Of Breath, Itching, Swelling, Rash   Amlodipine Swelling, Other (See Comments)   Of legs   Percocet [oxycodone-acetaminophen] Other (See Comments)   hallucinations   Tdap [tetanus-diphth-acell Pertussis] Itching, Swelling, Rash   Tetanus Toxoids Itching, Swelling, Rash   Tramadol Swelling, Other (See Comments)   Of legs   Carbamazepine Other (See Comments)   Lactose Intolerance (gi)    Macrodantin [nitrofurantoin] Other (See Comments)   Tetanus-diphtheria Toxoids Td Other (See Comments)   Topiramate Other (See Comments)   Diovan [valsartan] Other (See Comments)   Cough   Diphtheria Toxoid Itching, Swelling, Rash   Macrodantin [nitrofurantoin Macrocrystal] Other (See Comments)   unspecified        Medication List     STOP taking these medications    SSD 1 % cream Generic drug: silver sulfADIAZINE       TAKE these medications    acetaminophen 500 MG tablet Commonly known as: TYLENOL Take 500 mg by mouth at bedtime.   allopurinol 300 MG tablet Commonly known as: ZYLOPRIM Take 150 mg by mouth every evening.   cephALEXin 500 MG capsule Commonly known as: Keflex Take 1 capsule (500 mg total) by mouth 4 (four) times daily for 4 days.   Clear Eyes Complete Soln Place 2 drops into both eyes 2 (two) times daily.  clobetasol cream 0.05 % Commonly known as: TEMOVATE Apply 1 application topically as needed.   diltiazem 30 MG tablet Commonly known as: CARDIZEM Take 1 tablet (30 mg total) by mouth every 6 (six) hours as needed (For atial fibrillation with HR greater than 100 bpm.).   dofetilide 250 MCG capsule Commonly known as: TIKOSYN TAKE 1 CAPSULE BY MOUTH TWICE DAILY    furosemide 40 MG tablet Commonly known as: LASIX Take 60 mg by mouth daily.   HYDROcodone-acetaminophen 5-325 MG tablet Commonly known as: NORCO/VICODIN Take 1 tablet by mouth every 6 (six) hours as needed for moderate pain or severe pain. What changed:  when to take this reasons to take this   losartan 25 MG tablet Commonly known as: COZAAR Take 25 mg by mouth daily.   magnesium oxide 400 MG tablet Commonly known as: MAG-OX Take 1 tablet in the AM and 1/2 tablet in the PM   metFORMIN 500 MG tablet Commonly known as: GLUCOPHAGE Take 500 mg by mouth 2 (two) times daily with a meal.   metoprolol tartrate 100 MG tablet Commonly known as: LOPRESSOR Take 1 tablet (100 mg total) by mouth 2 (two) times daily. What changed:  medication strength how much to take when to take this additional instructions   OneTouch Ultra test strip Generic drug: glucose blood   potassium chloride SA 20 MEQ tablet Commonly known as: KLOR-CON Take 1.5 tablets (30 mEq total) by mouth daily.   PreserVision AREDS 2 Caps Take 2 capsules by mouth daily.   vitamin B-12 500 MCG tablet Commonly known as: CYANOCOBALAMIN Take 500 mcg by mouth daily.   Vitamin D3 25 MCG (1000 UT) Caps Take 3 capsules by mouth daily in the afternoon.   warfarin 5 MG tablet Commonly known as: COUMADIN Take 5 mg by mouth daily. 5 mg daily except for Monday take 1/2 tablet         Brief H and P: For complete details please refer to admission H and P, but in brief Patient is a 80 year old female with history of A. fib, DVT, PE, HLP, hypertension, peripheral neuropathy, sleep apnea, diabetes mellitus type 2 presented with generalized weakness.  Patient reported sudden weakness on the day of admission, try to get up and her left leg was asleep and she fell onto the floor on her left side.  Pain spontaneously resolved however she is was not able to get up, at baseline ambulates with a walker.  She had complained of  dysuria for the last 2 or 3 weeks, has a history of chronic UTIs, had finished a course of antibiotics 2 weeks ago In ED, febrile, 1001.9, heart rate 85-1 04, respiratory rate 17-31, BP 104/58, O2 sats 92%.  UA showed UTI.  Physical exam with cellulitis of the right lower extremity.  Chest x-ray negative.  Lactic acid 3.2 patient was admitted for further work-up.  Hospital Course:   Sepsis likely due to right lower extremity cellulitis, POA -Met sepsis criteria on admission, febrile, tachycardia, tachypnea, leukocytosis with lactic acidosis.  Source initially thought to be due to acute UTI or cellulitis.   -Resolved, initially placed on aggressive IV fluid hydration, now discontinued and patient resumed on home dose of Lasix.   -Leukocytosis improved, -Urine culture showed less than 10,000 colonies, hence acute UTI ruled out -Complete full course with p.o. Keflex for 4 more days   Right lower extremity cellulitis -No acute DVT -Continue antibiotics, p.o. Keflex for 4 more days to complete course  Persistent atrial fibrillation -CHA2DS2-VASc score of 5.  INR therapeutic, continue warfarin -Cardiology was consulted.  Patient remains in atrial fibrillation, asymptomatic.  She was continued on Tikosyn, Lopressor, as needed short-acting Cardizem. Patient will need follow-up appointment in the A. fib clinic when she completes her rehab.  Per cardiology, no clear indication for TEE guided cardioversion either.   Chronic diastolic CHF -Per patient she is on Lasix 40 mg daily at home, occasionally 60 mg  -Lasix was held on admission due to sepsis, received Lasix 40 mg IV x1 on 11/16, resumed home dose of oral Lasix.    Acute metabolic encephalopathy -Likely due to #1, resolved, currently alert and oriented x3, at baseline   Generalized debility, falls, left knee pain -PT evaluation recommended SNF -X-ray left knee showed small to moderate suprapatellar knee joint effusion, left knee  arthroplasty. CT head negative for any acute abnormalities   Essential hypertension -BP at the time of admission was soft and antihypertensives were held.  Now improved, resumed outpatient meds.  History of DVT/PE/A. Fib Continue warfarin   Diabetes mellitus type 2, NIDDM, uncontrolled with hyperglycemia Patient was placed on sliding scale insulin while inpatient, resume outpatient regimen       Obesity Estimated body mass index is 49.88 kg/m as calculated from the following:   Height as of this encounter: 5' 3"  (1.6 m).   Weight as of this encounter: 127.7 kg.    Day of Discharge S: No acute complaints, feeling better.  No acute chest pain or shortness of breath.  BP (!) 149/95 (BP Location: Left Wrist)   Pulse (!) 106   Temp 98.3 F (36.8 C)   Resp 18   Ht 5' 3"  (1.6 m)   Wt 127.7 kg   SpO2 96%   BMI 49.88 kg/m   Physical Exam: General: Alert and awake oriented x3 not in any acute distress. CVS: irregularly irregular Chest: clear to auscultation bilaterally, no wheezing rales or rhonchi Abdomen: soft nontender, nondistended, normal bowel sounds Extremities: Chronic lymphedema, erythema lower right lower extremity clearing Neuro: no new deficits    Get Medicines reviewed and adjusted: Please take all your medications with you for your next visit with your Primary MD  Please request your Primary MD to go over all hospital tests and procedure/radiological results at the follow up. Please ask your Primary MD to get all Hospital records sent to his/her office.  If you experience worsening of your admission symptoms, develop shortness of breath, life threatening emergency, suicidal or homicidal thoughts you must seek medical attention immediately by calling 911 or calling your MD immediately  if symptoms less severe.  You must read complete instructions/literature along with all the possible adverse reactions/side effects for all the Medicines you take and that have  been prescribed to you. Take any new Medicines after you have completely understood and accept all the possible adverse reactions/side effects.   Do not drive when taking pain medications.   Do not take more than prescribed Pain, Sleep and Anxiety Medications  Special Instructions: If you have smoked or chewed Tobacco  in the last 2 yrs please stop smoking, stop any regular Alcohol  and or any Recreational drug use.  Wear Seat belts while driving.  Please note  You were cared for by a hospitalist during your hospital stay. Once you are discharged, your primary care physician will handle any further medical issues. Please note that NO REFILLS for any discharge medications will be authorized once you are discharged,  as it is imperative that you return to your primary care physician (or establish a relationship with a primary care physician if you do not have one) for your aftercare needs so that they can reassess your need for medications and monitor your lab values.   The results of significant diagnostics from this hospitalization (including imaging, microbiology, ancillary and laboratory) are listed below for reference.      Procedures/Studies:  DG Knee 1-2 Views Left  Result Date: 08/27/2021 CLINICAL DATA:  Knee pain EXAM: LEFT KNEE - 1-2 VIEW COMPARISON:  None. FINDINGS: Left knee arthroplasty. No fracture or dislocation is seen. Small to moderate suprapatellar knee joint effusion. IMPRESSION: Left knee arthroplasty. Small to moderate suprapatellar knee joint effusion. Electronically Signed   By: Julian Hy M.D.   On: 08/27/2021 01:48   CT HEAD WO CONTRAST (5MM)  Result Date: 08/27/2021 CLINICAL DATA:  Weakness, fatigue EXAM: CT HEAD WITHOUT CONTRAST TECHNIQUE: Contiguous axial images were obtained from the base of the skull through the vertex without intravenous contrast. COMPARISON:  None. FINDINGS: Brain: No evidence of acute infarction, hemorrhage, hydrocephalus,  extra-axial collection or mass lesion/mass effect. Mild subcortical white matter and periventricular small vessel ischemic changes. Vascular: No hyperdense vessel or unexpected calcification. Skull: Normal. Negative for fracture or focal lesion. Sinuses/Orbits: The visualized paranasal sinuses are essentially clear. The mastoid air cells are unopacified. Other: None. IMPRESSION: No evidence of acute intracranial abnormality. Mild small vessel ischemic changes. Electronically Signed   By: Julian Hy M.D.   On: 08/27/2021 01:50   US Venous Img Lower Bilateral (DVT)  Result Date: 08/28/2021 CLINICAL DATA:  Bilateral lower extremity pain and edema. Recent fall. History of DVT and pulmonary embolism. Patient is currently on anticoagulation. Evaluate for acute chronic DVT. EXAM: BILATERAL LOWER EXTREMITY VENOUS DOPPLER ULTRASOUND TECHNIQUE: Gray-scale sonography with graded compression, as well as color Doppler and duplex ultrasound were performed to evaluate the lower extremity deep venous systems from the level of the common femoral vein and including the common femoral, femoral, profunda femoral, popliteal and calf veins including the posterior tibial, peroneal and gastrocnemius veins when visible. The superficial great saphenous vein was also interrogated. Spectral Doppler was utilized to evaluate flow at rest and with distal augmentation maneuvers in the common femoral, femoral and popliteal veins. COMPARISON:  None. FINDINGS: Examination is degraded due to patient body habitus and poor sonographic window. RIGHT LOWER EXTREMITY Common Femoral Vein: No evidence of thrombus. Normal compressibility, respiratory phasicity and response to augmentation. Saphenofemoral Junction: No evidence of thrombus. Normal compressibility and flow on color Doppler imaging. Profunda Femoral Vein: No evidence of thrombus. Normal compressibility and flow on color Doppler imaging. Femoral Vein: No evidence of thrombus. Normal  compressibility, respiratory phasicity and response to augmentation. Popliteal Vein: No evidence of thrombus. Normal compressibility, respiratory phasicity and response to augmentation. Calf Veins: No evidence of thrombus. Normal compressibility and flow on color Doppler imaging. Superficial Great Saphenous Vein: No evidence of thrombus. Normal compressibility. Venous Reflux:  None. Other Findings:  None. LEFT LOWER EXTREMITY Common Femoral Vein: No evidence of thrombus. Normal compressibility, respiratory phasicity and response to augmentation. Saphenofemoral Junction: No evidence of thrombus. Normal compressibility and flow on color Doppler imaging. Profunda Femoral Vein: No evidence of thrombus. Normal compressibility and flow on color Doppler imaging. Femoral Vein: No evidence of thrombus. Normal compressibility, respiratory phasicity and response to augmentation. Popliteal Vein: No evidence of thrombus. Normal compressibility, respiratory phasicity and response to augmentation. Calf Veins: No evidence of thrombus. Normal compressibility  and flow on color Doppler imaging. Superficial Great Saphenous Vein: No evidence of thrombus. Normal compressibility. Venous Reflux:  None. Other Findings:  None. IMPRESSION: No evidence of acute or chronic DVT within either lower extremity. Electronically Signed   By: Sandi Mariscal M.D.   On: 08/28/2021 09:29   DG Chest Port 1 View  Result Date: 08/26/2021 CLINICAL DATA:  Sepsis EXAM: PORTABLE CHEST 1 VIEW COMPARISON:  03/21/2021 FINDINGS: Chronic interstitial thickening is unchanged. Lungs are otherwise clear. No pneumothorax or pleural effusion. Cardiac size within normal limits. Pulmonary vascularity is normal. Osseous structures are age-appropriate. No acute bone abnormality. IMPRESSION: No active disease. Electronically Signed   By: Fidela Salisbury M.D.   On: 08/26/2021 19:08      LAB RESULTS: Basic Metabolic Panel: Recent Labs  Lab 08/27/21 0335 08/28/21 0501  08/29/21 0512 08/30/21 0511  NA 138   < > 138 138  K 3.6   < > 4.2 4.1  CL 106   < > 105 105  CO2 26   < > 26 27  GLUCOSE 146*   < > 111* 113*  BUN 20   < > 11 14  CREATININE 0.61   < > 0.49 0.53  CALCIUM 8.3*   < > 8.1* 8.4*  MG 1.8  --   --   --    < > = values in this interval not displayed.   Liver Function Tests: Recent Labs  Lab 08/26/21 1838 08/27/21 0335  AST 28 32  ALT 20 18  ALKPHOS 57 48  BILITOT 0.8 0.6  PROT 7.7 6.4*  ALBUMIN 4.0 3.2*   No results for input(s): LIPASE, AMYLASE in the last 168 hours. No results for input(s): AMMONIA in the last 168 hours. CBC: Recent Labs  Lab 08/27/21 0335 08/28/21 0501 08/29/21 0512 08/30/21 0511  WBC 8.6   < > 5.5 7.5  NEUTROABS 7.0  --   --   --   HGB 11.5*   < > 10.7* 11.0*  HCT 37.1   < > 34.8* 35.7*  MCV 93.2   < > 92.1 93.9  PLT DCLMP   < > 121* 160   < > = values in this interval not displayed.   Cardiac Enzymes: No results for input(s): CKTOTAL, CKMB, CKMBINDEX, TROPONINI in the last 168 hours. BNP: Invalid input(s): POCBNP CBG: Recent Labs  Lab 08/30/21 0739 08/30/21 1122  GLUCAP 103* 105*       Disposition and Follow-up: Discharge Instructions     (HEART FAILURE PATIENTS) Call MD:  Anytime you have any of the following symptoms: 1) 3 pound weight gain in 24 hours or 5 pounds in 1 week 2) shortness of breath, with or without a dry hacking cough 3) swelling in the hands, feet or stomach 4) if you have to sleep on extra pillows at night in order to breathe.   Complete by: As directed    Diet Carb Modified   Complete by: As directed    Increase activity slowly   Complete by: As directed         DISPOSITION: [facility] information for follow-up providers     [facility], [facility] Follow up.   Specialty: Home Health Services Why: Will contact you to schedule home health visits. Contact information: [place] [address] [phone]         Thompson Grayer, MD Follow up.   Specialty: Cardiology Contact  information: [place] [address] [phone]         Josetta Huddle, MD. Schedule an appointment as soon as possible for a visit in 2 week(s).   Specialty: Internal Medicine Contact information: [address] [place] [address] [phone]         Thompson Grayer, MD Follow up in 2 week(s).   Specialty: Cardiology Why: for hospital follow-up, obtain labs Contact information: [place] [address] [phone]              Contact information for after-discharge care     [facility] .   Service: Skilled Nursing Contact information: [address] [place] [phone]                      Time coordinating discharge:  76mns   Signed:   REstill CottaM.D. [facility] 08/30/2021, 12:20 PM

## 2021-08-30 NOTE — Progress Notes (Signed)
ANTICOAGULATION CONSULT NOTE - Initial Consult  Pharmacy Consult for warfarin Indication: atrial fibrillation and hx DVT  Allergies  Allergen Reactions   Bactrim [Sulfamethoxazole-Trimethoprim]     Drop in BP   Morphine And Related Other (See Comments)    Unresponsive-Very bad reaction.  Can take hydrocodone.    Oysters [Shellfish Allergy] Shortness Of Breath, Itching, Swelling and Rash   Amlodipine Swelling and Other (See Comments)    Of legs   Percocet [Oxycodone-Acetaminophen] Other (See Comments)    hallucinations   Tdap [Tetanus-Diphth-Acell Pertussis] Itching, Swelling and Rash   Tetanus Toxoids Itching, Swelling and Rash   Tramadol Swelling and Other (See Comments)    Of legs   Carbamazepine Other (See Comments)   Lactose Intolerance (Gi)    Macrodantin [Nitrofurantoin] Other (See Comments)   Tetanus-Diphtheria Toxoids Td Other (See Comments)   Topiramate Other (See Comments)   Diovan [Valsartan] Other (See Comments)    Cough   Diphtheria Toxoid Itching, Swelling and Rash   Macrodantin [Nitrofurantoin Macrocrystal] Other (See Comments)    unspecified    Patient Measurements: Height: 5' 3"  (160 cm) Weight: 127.7 kg (281 lb 9.6 oz) IBW/kg (Calculated) : 52.4 Heparin Dosing Weight:   Vital Signs: Temp: 98.3 F (36.8 C) (11/17 0608) Temp Source: Oral (11/16 2104) BP: 149/95 (11/17 0608) Pulse Rate: 106 (11/17 0608)  Labs: Recent Labs    08/28/21 0501 08/29/21 0512 08/30/21 0511  HGB 11.2* 10.7* 11.0*  HCT 35.8* 34.8* 35.7*  PLT 104* 121* 160  LABPROT 22.8* 23.8* 25.1*  INR 2.0* 2.1* 2.3*  CREATININE 0.53 0.49 0.53     Estimated Creatinine Clearance: 73 mL/min (by C-G formula based on SCr of 0.53 mg/dL).   Medical History: Past Medical History:  Diagnosis Date   Anemia    Arthritis    DVT (deep venous thrombosis) ([place]) 01/2009   GERD (gastroesophageal reflux disease)    Gout    History of blood transfusion    1990's   History of colonic  polyps 09/2010   History of pneumonia    Hyperlipidemia    Hypertension    Lactose intolerance    Macular degeneration, dry    bilateral   Migraine    Obesity    Osteopenia    PAF (paroxysmal atrial fibrillation) ([facility])    Peripheral neuropathy    Pulmonary embolism ([place]) 01/2009   Sleep apnea    CPAP pressure 4.0-16.0   Thoracic ascending aortic aneurysm    Type II diabetes mellitus ([facility])    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   08/25/2021   allopurinol (ZYLOPRIM) 300 MG tablet Take 150 mg by mouth every evening.    08/25/2021   Cholecalciferol (VITAMIN D3) 25 MCG (1000 UT) CAPS Take 3 capsules by mouth daily in the afternoon.   08/26/2021   clobetasol cream (TEMOVATE) 3.24 % Apply 1 application topically as needed.   Past Month   dofetilide (TIKOSYN) 250 MCG capsule TAKE 1 CAPSULE BY MOUTH TWICE DAILY 180 capsule 3 08/26/2021   furosemide (LASIX) 40 MG tablet Take 60 mg by mouth daily.   08/26/2021   HYDROcodone-acetaminophen (NORCO/VICODIN) 5-325 MG tablet Take 1 tablet by mouth as needed.   unknown   Hyprom-Naphaz-Polysorb-Zn Sulf (CLEAR EYES COMPLETE) SOLN Place 2 drops into both eyes 2 (two)  times daily.   08/25/2021   losartan (COZAAR) 25 MG tablet Take 25 mg by mouth daily.   08/26/2021   magnesium oxide (MAG-OX) 400 MG tablet Take 1 tablet in the AM and 1/2 tablet in the PM   08/26/2021   metFORMIN (GLUCOPHAGE) 500 MG tablet Take 500 mg by mouth 2 (two) times daily with a meal.    08/26/2021   metoprolol tartrate (LOPRESSOR) 25 MG tablet Take 25-50 mg by mouth See admin instructions. Take 2 tablets in the morning, and 2 tablets in the evening.   08/26/2021 at 0900   Multiple Vitamins-Minerals (PRESERVISION AREDS 2) CAPS Take 2 capsules by mouth daily.   08/26/2021   potassium  chloride SA (K-DUR,KLOR-CON) 20 MEQ tablet Take 1.5 tablets (30 mEq total) by mouth daily. 30 tablet 6 08/26/2021   warfarin (COUMADIN) 5 MG tablet Take 5 mg by mouth daily. 5 mg daily except for Monday take 1/2 tablet   08/25/2021 at 1700   ONETOUCH ULTRA test strip       SSD 1 % cream Apply topically 2 (two) times daily. (Patient not taking: No sig reported)   Not Taking   vitamin B-12 (CYANOCOBALAMIN) 500 MCG tablet Take 500 mcg by mouth daily.       Assessment: Pharmacy consulted to dose warfarin in patient with afib/hx DVT. Home dose listed as 2.5 mg on Mon and 5 mg ROW.  INR 2.1> 2.3    Goal of Therapy:  INR 2-3 Monitor platelets by anticoagulation protocol: Yes   Plan:  Warfarin 5 mg x 1 dose Monitor daily INR and s/s of bleeding.  Margot Ables, PharmD Clinical Pharmacist 08/30/2021 8:16 AM

## 2021-08-30 NOTE — Progress Notes (Signed)
Progress Note  Patient Name: Natasha Glass Date of Encounter: 08/30/2021  Primary Cardiologist: Dr. Thompson Grayer  Subjective   No palpitations or shortness of breath.  Legs gradually feeling better.  Anticipates transition into rehab.  Inpatient Medications    Scheduled Meds:  dofetilide  250 mcg Oral BID   furosemide  40 mg Oral Daily   insulin aspart  0-15 Units Subcutaneous TID WC   magnesium oxide  800 mg Oral Daily   metoprolol tartrate  75 mg Oral BID   nystatin ointment   Topical BID   potassium chloride  40 mEq Oral Daily   vitamin B-12  500 mcg Oral Daily   warfarin  5 mg Oral ONCE-1600   Warfarin - Pharmacist Dosing Inpatient   Does not apply q1600   Continuous Infusions:  cefTRIAXone (ROCEPHIN)  IV 2 g (08/29/21 1615)   PRN Meds: acetaminophen **OR** acetaminophen, diltiazem, HYDROcodone-acetaminophen, ondansetron **OR** ondansetron (ZOFRAN) IV   Vital Signs    Vitals:   08/29/21 0754 08/29/21 1507 08/29/21 2104 08/30/21 0608  BP: (!) 143/80 (!) 133/93 (!) 142/83 (!) 149/95  Pulse: (!) 106 94 (!) 106 (!) 106  Resp:  18 18 18   Temp:  98.4 F (36.9 C) 98.6 F (37 C) 98.3 F (36.8 C)  TempSrc:  Oral Oral   SpO2: 97% 100% 96% 96%  Weight:      Height:        Intake/Output Summary (Last 24 hours) at 08/30/2021 0927 Last data filed at 08/29/2021 1700 Gross per 24 hour  Intake 240 ml  Output 1500 ml  Net -1260 ml   Filed Weights   08/26/21 1800 08/27/21 0829  Weight: 128.4 kg 127.7 kg    Telemetry    Atrial fibrillation.  Personally reviewed.  ECG    An ECG dated 08/29/2021 was personally reviewed today and demonstrated:  Atrial fibrillation with QTc 457 ms.  Physical Exam   GEN: No acute distress.   Neck: No JVD. Cardiac: Irregularly irregular with 1/6 systolic murmur. Respiratory: Nonlabored.  Clear to auscultation. GI: Soft, nontender, bowel sounds present. MS: Chronic lymphedema with clearing erythema right lower extremity from  initial demarcation. Neuro:  Nonfocal. Psych: Alert and oriented x 3. Normal affect.  Labs    Chemistry Recent Labs  Lab 08/26/21 1838 08/27/21 0335 08/28/21 0501 08/29/21 0512 08/30/21 0511  NA 136 138 136 138 138  K 4.3 3.6 3.7 4.2 4.1  CL 100 106 105 105 105  CO2 24 26 27 26 27   GLUCOSE 159* 146* 124* 111* 113*  BUN 23 20 14 11 14   CREATININE 0.76 0.61 0.53 0.49 0.53  CALCIUM 9.2 8.3* 8.0* 8.1* 8.4*  PROT 7.7 6.4*  --   --   --   ALBUMIN 4.0 3.2*  --   --   --   AST 28 32  --   --   --   ALT 20 18  --   --   --   ALKPHOS 57 48  --   --   --   BILITOT 0.8 0.6  --   --   --   GFRNONAA >60 >60 >60 >60 >60  ANIONGAP 12 6 4* 7 6     Hematology Recent Labs  Lab 08/28/21 0501 08/29/21 0512 08/30/21 0511  WBC 6.7 5.5 7.5  RBC 3.85* 3.78* 3.80*  HGB 11.2* 10.7* 11.0*  HCT 35.8* 34.8* 35.7*  MCV 93.0 92.1 93.9  MCH 29.1 28.3 28.9  MCHC 31.3 30.7 30.8  RDW 17.5* 17.2* 17.0*  PLT 104* 121* 160    Radiology    No results found.  Assessment & Plan    1.  Paroxysmal to persistent atrial fibrillation with CHA2DS2-VASc score of 5.  She remains in atrial fibrillation, asymptomatic at this time.  Continues on Tikosyn, Lopressor, and as needed short acting Cardizem.  Also on Coumadin for stroke prophylaxis per pharmacy with INR 2.1.  2.  Essential hypertension.  Blood pressure trending back up, losartan remains on hold.  3.  Sepsis due to UTI and lower extremity cellulitis, management per primary team.  Continue Tikosyn at present dose, increase Lopressor to 100 mg twice daily.  Continue Coumadin per pharmacy (INR therapeutic, she was subtherapeutic at presentation however).  Agree with resumption of Lasix, will adjust to 60 mg daily which was her home dose.  Can probably go back on losartan as well if blood pressure remains up.  Would not cardiovert now since she was recently subtherapeutic, no clear indication for TEE guided cardioversion either.  This can be readdressed  as an outpatient if she does not convert in the meanwhile.  Please make sure that she has follow-up in the atrial fibrillation clinic when she completes her rehabilitation.  Signed, Rozann Lesches, MD  08/30/2021, 9:27 AM

## 2021-08-30 NOTE — TOC Transition Note (Signed)
Transition of Care [facility]) - CM/SW Discharge Note   Patient Details  Name: Natasha Glass MRN: [id number] Date of Birth: [birth year]  [facility] CM/SW Contact:  Shade Flood, LCSW Phone Number: 08/30/2021, 12:38 PM   Clinical Narrative:     Pt stable for dc today per MD. Pt has insurance auth for SNF rehab and [facility] can take pt today. Updated pt and her son and they remain in agreement with the dc plan.  DC clinical sent electronically. RN to call report.   There are no other TOC needs for dc.  Final next level of care: [place] Barriers to Discharge: Barriers Resolved   Patient Goals and CMS Choice Patient states their goals for this hospitalization and ongoing recovery are:: return home   Choice offered to / list presented to : Patient  Discharge Placement              Patient chooses bed at: [facility] Patient to be transferred to facility by: w/c Name of family member notified: Josph Macho Patient and family notified of of transfer: 08/30/21  Discharge Plan and Services In-house Referral: Clinical Social Work   Post Acute Care Choice: Home Health          DME Arranged: N/A         HH Arranged: RN, PT, OT HH Agency: [place] Date [place]: 08/27/21 Time [place]: 1552 Representative spoke with at [place]: [place] ([place]) Interventions     Readmission Risk Interventions Readmission Risk Prevention Plan 08/30/2021  Medication Screening Complete  Transportation Screening Complete  Some recent data might be hidden

## 2021-08-30 NOTE — Progress Notes (Signed)
Location:  [place] Room Number: 151 Place of Service:  SNF (31)   CODE STATUS: full code   Allergies  Allergen Reactions   Bactrim [Sulfamethoxazole-Trimethoprim]     Drop in BP   Morphine And Related Other (See Comments)    Unresponsive-Very bad reaction.  Can take hydrocodone.    Oysters [Shellfish Allergy] Shortness Of Breath, Itching, Swelling and Rash   Amlodipine Swelling and Other (See Comments)    Of legs   Percocet [Oxycodone-Acetaminophen] Other (See Comments)    hallucinations   Tdap [Tetanus-Diphth-Acell Pertussis] Itching, Swelling and Rash   Tetanus Toxoids Itching, Swelling and Rash   Tramadol Swelling and Other (See Comments)    Of legs   Carbamazepine Other (See Comments)   Lactose Intolerance (Gi)    Macrodantin [Nitrofurantoin] Other (See Comments)   Tetanus-Diphtheria Toxoids Td Other (See Comments)   Topiramate Other (See Comments)   Diovan [Valsartan] Other (See Comments)    Cough   Diphtheria Toxoid Itching, Swelling and Rash   Macrodantin [Nitrofurantoin Macrocrystal] Other (See Comments)    unspecified    Chief Complaint  Patient presents with   Hospitalization Follow-up    HPI:  She is an 80 year old woman who has been hospitalized from 08-26-21 through 08-30-21. Her medical history includes: diabetes gerd hypertension. She had a fall on the day of admission due to weakness. She was not able to get up. She normally ambulates with a walker. She was admitted with sepsis likely from right lower extremity cellulitis. She was given IVF initially she is now on her routine lasix dosing. She was ruled out of UTI. She is on 4 more days of keflex; will need to extend this for another 7 days. She continues to bilateral lower extremity inflammation present. She is here for short term rehab with her goal to return back home. She does have diarrhea for the past several weeks; will need probiotic due to her abt use. She will continue to  be followed for her chronic illnesses including:  PAF (paroxsymal atrial fibrillation). Hypertension associated with type 2 diabetes mellitus:  Aortic atherosclerosis )/aneurysm of ascending aorta without rupture   Ulcerative colitis without complication unspecified location      Past Medical History:  Diagnosis Date   Anemia    Arthritis    DVT (deep venous thrombosis) ([place]) 01/2009   GERD (gastroesophageal reflux disease)    Gout    History of blood transfusion    1990's   History of colonic polyps 09/2010   History of pneumonia    Hyperlipidemia    Hypertension    Lactose intolerance    Macular degeneration, dry    bilateral   Migraine    Obesity    Osteopenia    PAF (paroxysmal atrial fibrillation) (HCC)    Peripheral neuropathy    Pulmonary embolism ([place]) 01/2009   Sleep apnea    CPAP pressure 4.0-16.0   Thoracic ascending aortic aneurysm    Type II diabetes mellitus ([place])    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)    Urinary incontinence     Past Surgical History:  Procedure Laterality Date   [place] N/A 05/07/2018   Procedure: CARDIOVERSION;  Surgeon: Josue Hector, MD;  Location: [facility] ENDOSCOPY;  Service: Cardiovascular;  Laterality: N/A;   COLONOSCOPY W/ BIOPSIES AND POLYPECTOMY  2005; 2011; 2012   COLONOSCOPY  WITH PROPOFOL N/A 09/10/2016   Procedure: COLONOSCOPY WITH PROPOFOL;  Surgeon: Garlan Fair, MD;  Location: [facility] ENDOSCOPY;  Service: Endoscopy;  Laterality: N/A;   JOINT REPLACEMENT     KNEE ARTHROSCOPY Left ~ [place] Left 2011   TOTAL KNEE ARTHROPLASTY Bilateral 2005-2012   left-right    Social History   Socioeconomic History   Marital status: Widowed    Spouse name: Not on file   Number of children: Not on file   Years of education: Not on file   Highest education level: Not on file  Occupational  History   Not on file  Tobacco Use   Smoking status: Never   Smokeless tobacco: Never  Vaping Use   Vaping Use: Never used  Substance and Sexual Activity   Alcohol use: No   Drug use: No   Sexual activity: Not Currently  Other Topics Concern   Not on file  Social History Narrative   Not on file   Social Determinants of Health   Financial Resource Strain: Not on file  Food Insecurity: Not on file  Transportation Needs: Not on file  Physical Activity: Not on file  Stress: Not on file  Social Connections: Not on file  Intimate Partner Violence: Not on file   Family History  Problem Relation Age of Onset   Colon cancer Mother    Lung cancer Mother    Heart disease Father       VITAL SIGNS BP 128/72   Pulse 98   Temp 97.9 F (36.6 C)   Ht 5' 3"  (1.6 m)   Wt 281 lb (127.5 kg)   BMI 49.78 kg/m   Outpatient Encounter Medications as of 08/30/2021  Medication Sig   acetaminophen (TYLENOL) 500 MG tablet Take 500 mg by mouth at bedtime.   allopurinol (ZYLOPRIM) 300 MG tablet Take 150 mg by mouth every evening.    cephALEXin (KEFLEX) 500 MG capsule Take 1 capsule (500 mg total) by mouth 4 (four) times daily for 4 days.   Cholecalciferol (VITAMIN D3) 25 MCG (1000 UT) CAPS Take 3 capsules by mouth daily in the afternoon.   clobetasol cream (TEMOVATE) 3.30 % Apply 1 application topically as needed.   diltiazem (CARDIZEM) 30 MG tablet Take 1 tablet (30 mg total) by mouth every 6 (six) hours as needed (For atial fibrillation with HR greater than 100 bpm.).   dofetilide (TIKOSYN) 250 MCG capsule TAKE 1 CAPSULE BY MOUTH TWICE DAILY   furosemide (LASIX) 40 MG tablet Take 60 mg by mouth daily.   HYDROcodone-acetaminophen (NORCO/VICODIN) 5-325 MG tablet Take 1 tablet by mouth every 6 (six) hours as needed for moderate pain or severe pain.   Hyprom-Naphaz-Polysorb-Zn Sulf (CLEAR EYES COMPLETE) SOLN Place 2 drops into both eyes 2 (two) times daily.   losartan (COZAAR) 25 MG tablet  Take 25 mg by mouth daily.   magnesium oxide (MAG-OX) 400 MG tablet Take 1 tablet in the AM and 1/2 tablet in the PM   metFORMIN (GLUCOPHAGE) 500 MG tablet Take 500 mg by mouth 2 (two) times daily with a meal.    metoprolol tartrate (LOPRESSOR) 100 MG tablet Take 1 tablet (100 mg total) by mouth 2 (two) times daily.   Multiple Vitamins-Minerals (PRESERVISION AREDS 2) CAPS Take 2 capsules by mouth daily.   ONETOUCH ULTRA test strip    potassium chloride SA (K-DUR,KLOR-CON) 20 MEQ tablet Take 1.5 tablets (30 mEq total) by mouth daily.   vitamin  B-12 (CYANOCOBALAMIN) 500 MCG tablet Take 500 mcg by mouth daily.   warfarin (COUMADIN) 5 MG tablet Take 5 mg by mouth daily. 5 mg daily except for Monday take 1/2 tablet      SIGNIFICANT DIAGNOSTIC EXAMS  TODAY  08-26-21: chest x-ray:  Chronic interstitial thickening is unchanged. Lungs are otherwise clear. No pneumothorax or pleural effusion. Cardiac size within normal limits. Pulmonary vascularity is normal. Osseous structures are age-appropriate. No acute bone abnormality.  08-27-21: left knee x-ray:  Left knee arthroplasty. Small to moderate suprapatellar knee joint effusion.  08-27-21: ct of head:  No evidence of acute intracranial abnormality. Mild small vessel ischemic changes.  08-28-21: bilateral lower extremity doppler No evidence of acute or chronic DVT within either lower extremity.   LABS REVIEWED TODAY  08-26-21: wbc 14.3; hgb 13.4; hct 42.6; mcv 91.4 plt 126; glucose 159; bun 23; creat 0.76; k+ 4.3; na++ 136; ca 9.2 GFR >60; liver normal albumin 4.0; urine culture <10,000 blood culture: staphylococcus 08-27-21: wbc 8.6; hgb 11.5; hct 37.1 ;mcv 93.2 plt clump: glucose 146; bun 20; creat 0.61; k+ 3.6; na++ 138; ca 9.2; GFR>60; liver normal albumin 3.2; mag 1.8; blood culture: no growth; INR 1.9 08-30-21: wbc7.5; hgb 11.0; hct 35.7; mcv 93.9 plt 160; glucose  113; bun 14; creat 0.52; k+ 4.1; na++ 138; ca 8.4; GFR >60; INR 2.3     Review of Systems  Constitutional:  Negative for malaise/fatigue.  Respiratory:  Negative for cough and shortness of breath.   Cardiovascular:  Negative for chest pain, palpitations and leg swelling.  Gastrointestinal:  Positive for diarrhea. Negative for abdominal pain and heartburn.  Musculoskeletal:  Negative for back pain, joint pain and myalgias.  Skin: Negative.   Neurological:  Negative for dizziness.  Psychiatric/Behavioral:  The patient is not nervous/anxious.     Physical Exam Constitutional:      General: She is not in acute distress.    Appearance: She is well-developed. She is morbidly obese. She is not diaphoretic.  Neck:     Thyroid: No thyromegaly.  Cardiovascular:     Rate and Rhythm: Normal rate and regular rhythm.     Pulses: Normal pulses.     Heart sounds: Normal heart sounds.  Pulmonary:     Effort: Pulmonary effort is normal. No respiratory distress.     Breath sounds: Normal breath sounds.  Abdominal:     General: Bowel sounds are normal. There is no distension.     Palpations: Abdomen is soft.     Tenderness: There is no abdominal tenderness.  Musculoskeletal:        General: Normal range of motion.     Cervical back: Neck supple.     Right lower leg: Edema present.     Left lower leg: Edema present.     Comments: 2+ pitting pedal edema Lymphedema present bilateral lower extremities Limited ROM in bilateral lower extremities   Lymphadenopathy:     Cervical: No cervical adenopathy.  Skin:    General: Skin is warm and dry.     Comments: Bilateral lower extremities discolored Scaly bilateral lower extremities Redness and warmth on bilateral ankle areas of legs   Neurological:     Mental Status: She is alert and oriented to person, place, and time.  Psychiatric:        Mood and Affect: Mood normal.      ASSESSMENT/ PLAN:  TODAY  Sepsis with acute organ dysfunction without septic shock due to unspecified organism; unspecified  type:/cellulitis  right lower extremity is slowly improve will continue keflex 500 mg four times daily for total of 12 days.  Will monitor her status.   2. PAF (paroxsymal atrial fibrillation) heart rate is stable but remains irregular: will continue tikosyn 250 mcg daily lopressor 100 mg twice daily cardizem 30 mg every 6 hours as needed for rate control; is on long term coumadin therapy will monitor her status.    3. Hypertension associated with type 2 diabetes mellitus: is stable b/p 128/72: will continue lopressor 100 mg twice daily ans cozaar 25 mg daily   4. Aortic atherosclerosis (ct 03-21-21)/aneurysm of ascending aorta without rupture  will monitor   5. Ulcerative colitis without complication unspecified location : is without change has had several weeks of diarrheal stools; will place her on probiotic due to her abt use.   6. Type 2 diabetes mellitus with obesity: is stable hgb a1c 6.2 will continue metformin 500 mg twice daily will monitor   7. Degeneration of lumbar intervertebral discs is stable will continue tylenol 500 mg nightly for pain management has vicodin 5/325 mg every 6 hours as needed for pain  8. Acquired thrombophilia / thrombocytopenia: is stable plt count is 160; will continue to monitor her status.   9. Bilateral lower extremity edema: is stable will continue lasix 60 mg daily with k+ 30 meq daily   10. 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 Morbid obesity: BMI is 49.78  12. Anemia associated with diabetes mellitus: is without change hgb 11.0 will monitor    Ok Edwards NP [facility]  Contact [phone] Monday through Friday 8am- 5pm  After hours call [phone]

## 2021-08-31 ENCOUNTER — Encounter: Payer: Self-pay | Admitting: Adult Health

## 2021-08-31 ENCOUNTER — Non-Acute Institutional Stay (SKILLED_NURSING_FACILITY): Payer: Medicare Other | Admitting: Internal Medicine

## 2021-08-31 ENCOUNTER — Encounter: Payer: Self-pay | Admitting: Internal Medicine

## 2021-08-31 ENCOUNTER — Other Ambulatory Visit (HOSPITAL_COMMUNITY)
Admission: RE | Admit: 2021-08-31 | Discharge: 2021-08-31 | Disposition: A | Discharge status: Home / Self Care | Payer: Medicare Other | Source: Skilled Nursing Facility | Attending: Adult Health | Admitting: Adult Health

## 2021-08-31 DIAGNOSIS — E1169 Type 2 diabetes mellitus with other specified complication: Secondary | ICD-10-CM

## 2021-08-31 DIAGNOSIS — R6520 Severe sepsis without septic shock: Secondary | ICD-10-CM | POA: Diagnosis not present

## 2021-08-31 DIAGNOSIS — I4819 Other persistent atrial fibrillation: Secondary | ICD-10-CM

## 2021-08-31 DIAGNOSIS — L03115 Cellulitis of right lower limb: Secondary | ICD-10-CM | POA: Diagnosis not present

## 2021-08-31 DIAGNOSIS — I480 Paroxysmal atrial fibrillation: Secondary | ICD-10-CM | POA: Insufficient documentation

## 2021-08-31 DIAGNOSIS — A419 Sepsis, unspecified organism: Secondary | ICD-10-CM | POA: Diagnosis not present

## 2021-08-31 LAB — PROTIME-INR
INR: 2.3 — ABNORMAL HIGH (ref 0.8–1.2)
Prothrombin Time: 24.8 seconds — ABNORMAL HIGH (ref 11.4–15.2)

## 2021-08-31 NOTE — Patient Instructions (Signed)
See assessment and plan under each diagnosis in the problem list and acutely for this visit 

## 2021-08-31 NOTE — Progress Notes (Signed)
Location:  [place] Room Number: 151-P Place of Service:  SNF (31) Provider: Ok Edwards, NP  CODE STATUS: FULL CODE   Allergies  Allergen Reactions   Bactrim [Sulfamethoxazole-Trimethoprim]     Drop in BP   Morphine And Related Other (See Comments)    Unresponsive-Very bad reaction.  Can take hydrocodone.    Oysters [Shellfish Allergy] Shortness Of Breath, Itching, Swelling and Rash   Amlodipine Swelling and Other (See Comments)    Of legs   Percocet [Oxycodone-Acetaminophen] Other (See Comments)    hallucinations   Tdap [Tetanus-Diphth-Acell Pertussis] Itching, Swelling and Rash   Tetanus Toxoids Itching, Swelling and Rash   Tramadol Swelling and Other (See Comments)    Of legs   Carbamazepine Other (See Comments)   Lactose Intolerance (Gi)    Macrodantin [Nitrofurantoin] Other (See Comments)   Other Other (See Comments)   Tetanus-Diphtheria Toxoids Td Other (See Comments)   Topiramate Other (See Comments)   Diovan [Valsartan] Other (See Comments)    Cough   Diphtheria Toxoid Itching, Swelling and Rash   Macrodantin [Nitrofurantoin Macrocrystal] Other (See Comments)    unspecified    Chief Complaint  Patient presents with   Hospitalization Follow-up    Hospitalization follow up.    HPI:    Past Medical History:  Diagnosis Date   Anemia    Arthritis    DVT (deep venous thrombosis) ([place]) 01/2009   GERD (gastroesophageal reflux disease)    Gout    History of blood transfusion    1990's   History of colonic polyps 09/2010   History of pneumonia    Hyperlipidemia    Hypertension    Lactose intolerance    Macular degeneration, dry    bilateral   Migraine    Obesity    Osteopenia    PAF (paroxysmal atrial fibrillation) (HCC)    Peripheral neuropathy    Pulmonary embolism ([place]) 01/2009   Sleep apnea    CPAP pressure 4.0-16.0   Thoracic ascending aortic aneurysm    Type II diabetes mellitus (HCC)    Universal ulcerative  (chronic) colitis(556.6)    Urinary incontinence     Past Surgical History:  Procedure Laterality Date   [place] N/A 05/07/2018   Procedure: CARDIOVERSION;  Surgeon: Josue Hector, MD;  Location: [facility] ENDOSCOPY;  Service: Cardiovascular;  Laterality: N/A;   COLONOSCOPY W/ BIOPSIES AND POLYPECTOMY  2005; 2011; 2012   COLONOSCOPY WITH PROPOFOL N/A 09/10/2016   Procedure: COLONOSCOPY WITH PROPOFOL;  Surgeon: Garlan Fair, MD;  Location: [facility] ENDOSCOPY;  Service: Endoscopy;  Laterality: N/A;   JOINT REPLACEMENT     KNEE ARTHROSCOPY Left ~ [place] Left 2011   TOTAL KNEE ARTHROPLASTY Bilateral 2005-2012   left-right    Social History   Socioeconomic History   Marital status: Widowed    Spouse name: Not on file   Number of children: Not on file   Years of education: Not on file   Highest education level: Not on file  Occupational History   Not on file  Tobacco Use   Smoking status: Never   Smokeless tobacco: Never  Vaping Use   Vaping Use: Never used  Substance and Sexual Activity   Alcohol use: No   Drug use: No   Sexual activity: Not Currently  Other Topics Concern   Not  on file  Social History Narrative   Not on file   Social Determinants of Health   Financial Resource Strain: Not on file  Food Insecurity: Not on file  Transportation Needs: Not on file  Physical Activity: Not on file  Stress: Not on file  Social Connections: Not on file  Intimate Partner Violence: Not on file   Family History  Problem Relation Age of Onset   Colon cancer Mother    Lung cancer Mother    Heart disease Father       VITAL SIGNS BP 138/80   Pulse 86   Temp 98.3 F (36.8 C)   Resp (!) 22   Ht 5' 3"  (1.6 m)   SpO2 98%   BMI 49.78 kg/m   Outpatient Encounter Medications as of 08/31/2021  Medication Sig   acetaminophen (TYLENOL) 500 MG  tablet Take 500 mg by mouth at bedtime.   allopurinol (ZYLOPRIM) 300 MG tablet Take 150 mg by mouth every evening.    cephALEXin (KEFLEX) 500 MG capsule Take 1 capsule (500 mg total) by mouth 4 (four) times daily for 4 days.   Cholecalciferol (VITAMIN D3) 25 MCG (1000 UT) CAPS Take 3 capsules by mouth daily in the afternoon.   clobetasol cream (TEMOVATE) 5.46 % Apply 1 application topically as needed.   diltiazem (CARDIZEM) 30 MG tablet Take 1 tablet (30 mg total) by mouth every 6 (six) hours as needed (For atial fibrillation with HR greater than 100 bpm.).   dofetilide (TIKOSYN) 250 MCG capsule TAKE 1 CAPSULE BY MOUTH TWICE DAILY   furosemide (LASIX) 40 MG tablet Take 60 mg by mouth daily.   HYDROcodone-acetaminophen (NORCO/VICODIN) 5-325 MG tablet Take 1 tablet by mouth every 6 (six) hours as needed for moderate pain or severe pain.   losartan (COZAAR) 25 MG tablet Take 25 mg by mouth daily.   magnesium oxide (MAG-OX) 400 MG tablet Take 400 mg by mouth at bedtime.   metFORMIN (GLUCOPHAGE) 500 MG tablet Take 500 mg by mouth 2 (two) times daily with a meal.    metoprolol tartrate (LOPRESSOR) 100 MG tablet Take 1 tablet (100 mg total) by mouth 2 (two) times daily.   Multiple Vitamins-Minerals (PRESERVISION AREDS 2) CAPS Take 2 capsules by mouth daily.   Naphazoline-Hydroxyprop Mcell 0.03-0.5 % SOLN Place 1 drop into both eyes 2 (two) times daily.   nystatin (MYCOSTATIN) 100000 UNIT/ML suspension Take 30 mLs by mouth 4 (four) times daily.   potassium chloride SA (K-DUR,KLOR-CON) 20 MEQ tablet Take 1.5 tablets (30 mEq total) by mouth daily.   Probiotic Product (RISA-BID PROBIOTIC) TABS Take 1 tablet by mouth daily.   vitamin B-12 (CYANOCOBALAMIN) 500 MCG tablet Take 500 mcg by mouth daily.   warfarin (COUMADIN) 5 MG tablet Take 5 mg by mouth daily. 5 mg daily except for Monday take 1/2 tablet   [DISCONTINUED] Hyprom-Naphaz-Polysorb-Zn Sulf (CLEAR EYES COMPLETE) SOLN Place 2 drops into both eyes 2  (two) times daily.   [DISCONTINUED] magnesium oxide (MAG-OX) 400 MG tablet Take 1 tablet in the AM and 1/2 tablet in the PM   [DISCONTINUED] ONETOUCH ULTRA test strip    No facility-administered encounter medications on file as of 08/31/2021.     SIGNIFICANT DIAGNOSTIC EXAMS       ASSESSMENT/ PLAN:     Ok Edwards NP [facility]  Contact [phone] Monday through Friday 8am- 5pm  After hours call [phone]   This encounter was created in error - please disregard.

## 2021-08-31 NOTE — Progress Notes (Signed)
NURSING HOME LOCATION: [facility] ROOM NUMBER:  [id number]  CODE STATUS:  Full Code  PCP:  Josetta Huddle MD  This is a comprehensive admission note to this SNFperformed on this date less than 30 days from date of admission. Included are preadmission medical/surgical history; reconciled medication list; family history; social history and comprehensive review of systems.  Corrections and additions to the records were documented. Comprehensive physical exam was also performed 08/30/2021. Additionally a clinical summary was entered for each active diagnosis pertinent to this admission in the Problem List to enhance continuity of care.  HPI: Patient was hospitalized 11/13 - 08/30/2021 with sepsis in the context of right lower extremity cellulitis. Patient reported acute onset of weakness on the day of admission.  When she attempted to rise she states that her left leg was "asleep" and she fell on the floor on her left side.  Subsequently she was unable to rise; her baseline is ambulation with a walker.  She injured her left knee in the fall.  Imaging revealed small-moderate subpatellar effusion and evidence of previous left knee arthroplasty.   She had experienced dysuria over the previous 2-3 weeks in the context of a history of chronic UTIs.  She had completed a course of antibiotics 2 weeks prior to admission. In the ED temp was 101.9, pulse rate variable from 85-104, respiratory rate 17-31, and blood pressure 104/58.  Because of the soft blood pressure antihypertensives were initially held.  Urinalysis was abnormal and CBC revealed leukocytosis.  Lactic acid was 3.2. Aggressive IV fluid hydration was initiated and subsequently tapered and discontinued.  Urine culture revealed less than 10,000 colonies ruling out acute UTI as the etiology of her sepsis.   She was to receive a full course of oral Keflex for the cellulitis.She has a history of DVT but the acute right lower extremity  cellulitis was not associated with such. At admission H/H was 13.4/42.6; white count was 14,300; platelet count was 126,000.  [place] H/H was 10.7/34.8 and nadir platelet count was 104,000.  At discharge H/H was 11/35.7 with a platelet count of 160,000.  Creatinine range was 0.53 to 0.76; GFR remained greater than 60 throughout hospitalization.  Protein caloric malnutrition was suggested by an albumin of 3.2 and total protein of 6.4. She received sliding scale insulin for her diabetes with good control as evidenced by an A1c of 6.2%.  Outpatient regimen was resumed at discharge. She exhibited persistent AF; PT/INR was therapeutic.  Tikosyn, Lopressor, and as needed short acting Cardizem were continued.  Outpatient follow-up in the A. fib clinic was recommended post discharge from the SNF She does have a history of chronic diastolic congestive heart failure and is on Lasix 40-60 mg daily as indicated clinically.  Lasix was initially held at admission because of sepsis; but she received 40 mg of IV Lasix on 11/16 with subsequent resumption of oral Lasix at home maintenance dose. Her acute metabolic encephalopathy resolved with the rehydration and antibiotic therapy. Because of generalized debility with associated falls, PT recommended rehab at [facility].  Past medical and surgical history: Includes history of asthma, history of DVT and PTE, GERD, history of gout, history of colon polyps, dyslipidemia, essential hypertension, macular degeneration, PAF, peripheral neuropathy, history of ascending aortic aneurysm,  Social history: Nondrinker, non-smoker.  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  She has been an avid reader in the past until her macular degeneration affected here ability to read.Her favorite books are autobiographies.  Family history: Noncontributory due to advanced age.   Review of systems: She is intelligent & provides an excellent  history.  She describes decreased appetite  which she relates to "nerves".  She described the reason for hospitalization as "something in my blood, sepsis".  She states that she has had 7 UTIs within the last year.  She states that cultures have been performed each time antibiotics are prescribed except for 1 or 2 of these episodes.  She validates she has sleep apnea; her daughter is to bring in her CPAP machine.  Constitutional: No fever, significant weight change Eyes: No redness, discharge, pain, vision change ENT/mouth: No nasal congestion, purulent discharge, earache, change in hearing, sore throat  Cardiovascular: No chest pain, palpitations, paroxysmal nocturnal dyspnea Respiratory: No cough, sputum production, hemoptysis Gastrointestinal: No heartburn, dysphagia, abdominal pain, nausea /vomiting, rectal bleeding, melena, change in bowels Genitourinary: No dysuria, hematuria, pyuria, incontinence, nocturia @ this time Musculoskeletal: No joint stiffness, joint swelling Dermatologic: No rash, pruritus, change in appearance of skin Neurologic: No dizziness, headache, syncope, seizures, numbness, tingling Psychiatric: No significant insomnia Endocrine: No change in hair/skin/nails, excessive thirst, excessive hunger, excessive urination  Hematologic/lymphatic: No significant bruising, lymphadenopathy, abnormal bleeding Allergy/immunology: No itchy/watery eyes, significant sneezing, urticaria, angioedema  Physical exam:  Pertinent or positive findings: She is morbidly obese.  Her brows are prominent but eyebrows are decreased.  Ptosis is present bilaterally, greater on the left.  Lower lids are puffy.  She is edentulous and wearing only the upper plate.  Heart sounds are distant and rhythm is irregular and rate slow.  Breath sounds are decreased inferiorly.  Abdomen is massive.  There is dramatic fusiform changes of the knees.  She has 1-2+ pitting edema of the feet with half plus at the sock line.  Dorsalis pedis pulses are stronger than  posterior tibial pulses.  She has hyperpigmentation and keratotic changes of the shins greater on the right with associated brawny erythema but without frank cellulitis.  General appearance:  no acute distress, increased work of breathing is present.   Lymphatic: No lymphadenopathy about the head, neck, axilla. Eyes: No conjunctival inflammation or lid edema is present. There is no scleral icterus. Ears:  External ear exam shows no significant lesions or deformities.   Nose:  External nasal examination shows no deformity or inflammation. Nasal mucosa are pink and moist without lesions, exudates Oral exam: Lips and gums are healthy appearing.There is no oropharyngeal erythema or exudate. Neck:  No thyromegaly, masses, tenderness noted.    Heart:  No gallop, murmur, click, rub.  Lungs: without wheezes, rhonchi, rales, rubs. Abdomen: Bowel sounds are normal.  Abdomen is soft and nontender with no organomegaly, hernias, masses. GU: Deferred  Extremities:  No cyanosis, clubbing. Neurologic exam:Balance, Rhomberg, finger to nose testing could not be completed due to clinical state Skin: Warm & dry w/o tenting.  See clinical summary under each active problem in the Problem List with associated updated therapeutic plan

## 2021-08-31 NOTE — Assessment & Plan Note (Addendum)
She is completing oral Keflex course for right lower extremity cellulitis associated with sepsis syndrome 11/13 - 08/30/2021 SNF Wound Care Nurse monitoring

## 2021-08-31 NOTE — Assessment & Plan Note (Addendum)
Current A1c is 6.4% indicating excellent control.  A1c goal with advanced age and comorbidities will be less than 8%.  Monitor for hypoglycemia necessary.

## 2021-08-31 NOTE — Assessment & Plan Note (Signed)
Rhythm is irregular but rate is slow.  No indication for change in A. fib regimen.

## 2021-09-01 LAB — CULTURE, BLOOD (ROUTINE X 2)
Culture: NO GROWTH
Culture: NO GROWTH
Culture: NO GROWTH
Special Requests: ADEQUATE
Special Requests: ADEQUATE

## 2021-09-01 NOTE — Assessment & Plan Note (Signed)
Wound Care Nurse @ SNF following cellulitis response to Keflex

## 2021-09-04 ENCOUNTER — Encounter (INDEPENDENT_AMBULATORY_CARE_PROVIDER_SITE_OTHER): Payer: Medicare Other | Admitting: Ophthalmology

## 2021-09-05 ENCOUNTER — Ambulatory Visit: Payer: Medicare Other | Admitting: Internal Medicine

## 2021-09-10 ENCOUNTER — Non-Acute Institutional Stay (SKILLED_NURSING_FACILITY): Payer: Medicare Other | Admitting: Adult Health

## 2021-09-10 ENCOUNTER — Other Ambulatory Visit (HOSPITAL_COMMUNITY)
Admission: RE | Admit: 2021-09-10 | Discharge: 2021-09-10 | Disposition: A | Discharge status: Home / Self Care | Payer: Medicare Other | Source: Skilled Nursing Facility | Attending: Adult Health | Admitting: Adult Health

## 2021-09-10 ENCOUNTER — Encounter: Payer: Self-pay | Admitting: Adult Health

## 2021-09-10 DIAGNOSIS — I4819 Other persistent atrial fibrillation: Secondary | ICD-10-CM | POA: Diagnosis not present

## 2021-09-10 DIAGNOSIS — Z7901 Long term (current) use of anticoagulants: Secondary | ICD-10-CM | POA: Diagnosis not present

## 2021-09-10 DIAGNOSIS — I480 Paroxysmal atrial fibrillation: Secondary | ICD-10-CM | POA: Insufficient documentation

## 2021-09-10 LAB — PROTIME-INR
INR: 4.5 (ref 0.8–1.2)
Prothrombin Time: 42.7 seconds — ABNORMAL HIGH (ref 11.4–15.2)

## 2021-09-10 NOTE — Progress Notes (Signed)
Location:  [facility] Room Number: 151-P Place of Service:  SNF (31)   CODE STATUS: Full Code  Allergies  Allergen Reactions   Bactrim [Sulfamethoxazole-Trimethoprim]     Drop in BP   Morphine And Related Other (See Comments)    Unresponsive-Very bad reaction.  Can take hydrocodone.    Oysters [Shellfish Allergy] Shortness Of Breath, Itching, Swelling and Rash   Amlodipine Swelling and Other (See Comments)    Of legs   Percocet [Oxycodone-Acetaminophen] Other (See Comments)    hallucinations   Tdap [Tetanus-Diphth-Acell Pertussis] Itching, Swelling and Rash   Tetanus Toxoids Itching, Swelling and Rash   Tramadol Swelling and Other (See Comments)    Of legs   Carbamazepine Other (See Comments)   Lactose Intolerance (Gi)    Macrodantin [Nitrofurantoin] Other (See Comments)   Other Other (See Comments)   Tetanus-Diphtheria Toxoids Td Other (See Comments)   Topiramate Other (See Comments)   Diovan [Valsartan] Other (See Comments)    Cough   Diphtheria Toxoid Itching, Swelling and Rash   Macrodantin [Nitrofurantoin Macrocrystal] Other (See Comments)    unspecified    Chief Complaint  Patient presents with   Medical Management of Chronic Issues    INR management     HPI:  She is on long term therapy for atrial fibrillation. Her INR goal is 2-3. Her INR today is 4.5 with her previous INR of 2.3. she is taking coumadin 5 mg daily except 2.5 mg on Mondays. There are no reports of heart palpitations. There are no reports of chest pain present. There are no reports of missed doses.   Past Medical History:  Diagnosis Date   Anemia    Arthritis    DVT (deep venous thrombosis) ([place]) 01/2009   GERD (gastroesophageal reflux disease)    Gout    History of blood transfusion    1990's   History of colonic polyps 09/2010   History of pneumonia    Hyperlipidemia    Hypertension    Lactose intolerance    Macular degeneration, dry    bilateral   Migraine     Obesity    Osteopenia    PAF (paroxysmal atrial fibrillation) (H[facility])    Peripheral neuropathy    Pulmonary embolism ([place]) 01/2009   Sleep apnea    CPAP pressure 4.0-16.0   Thoracic ascending aortic aneurysm    Type II diabetes mellitus (HCC)    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)    Urinary incontinence     Past Surgical History:  Procedure Laterality Date   [place] N/A 05/07/2018   Procedure: CARDIOVERSION;  Surgeon: Josue Hector, MD;  Location: [facility] ENDOSCOPY;  Service: Cardiovascular;  Laterality: N/A;   COLONOSCOPY W/ BIOPSIES AND POLYPECTOMY  2005; 2011; 2012   COLONOSCOPY WITH PROPOFOL N/A 09/10/2016   Procedure: COLONOSCOPY WITH PROPOFOL;  Surgeon: Garlan Fair, MD;  Location: [facility] ENDOSCOPY;  Service: Endoscopy;  Laterality: N/A;   JOINT REPLACEMENT     KNEE ARTHROSCOPY Left ~ [place] Left 2011   TOTAL KNEE ARTHROPLASTY Bilateral 2005-2012   left-right    Social History   Socioeconomic History   Marital status: Widowed    Spouse name: Not on file   Number of children: Not on file   Years of education: Not on file   Highest education level: Not on file  Occupational History   Not on file  Tobacco Use   Smoking status: Never   Smokeless tobacco: Never  Vaping Use   Vaping Use: Never used  Substance and Sexual Activity   Alcohol use: No   Drug use: No   Sexual activity: Not Currently  Other Topics Concern   Not on file  Social History Narrative   Not on file   Social Determinants of Health   Financial Resource Strain: Not on file  Food Insecurity: Not on file  Transportation Needs: Not on file  Physical Activity: Not on file  Stress: Not on file  Social Connections: Not on file  Intimate Partner Violence: Not on file   Family History  Problem Relation Age of Onset   Colon cancer Mother    Lung  cancer Mother    Heart disease Father       VITAL SIGNS BP (!) 124/58   Pulse 86   Temp 98.1 F (36.7 C)   Resp 20   Ht 5' 3"  (1.6 m)   SpO2 94%   BMI 49.78 kg/m   Outpatient Encounter Medications as of 09/10/2021  Medication Sig   acetaminophen (TYLENOL) 500 MG tablet Take 500 mg by mouth at bedtime.   allopurinol (ZYLOPRIM) 300 MG tablet Take 150 mg by mouth every evening.    Cholecalciferol (VITAMIN D3) 25 MCG (1000 UT) CAPS Take 3 capsules by mouth daily in the afternoon.   clobetasol cream (TEMOVATE) 0.98 % Apply 1 application topically as needed.   diltiazem (CARDIZEM) 30 MG tablet Take 1 tablet (30 mg total) by mouth every 6 (six) hours as needed (For atial fibrillation with HR greater than 100 bpm.).   dofetilide (TIKOSYN) 250 MCG capsule TAKE 1 CAPSULE BY MOUTH TWICE DAILY   furosemide (LASIX) 40 MG tablet Take 60 mg by mouth daily.   losartan (COZAAR) 25 MG tablet Take 25 mg by mouth daily.   magnesium oxide (MAG-OX) 400 MG tablet Take 400 mg by mouth at bedtime.   metFORMIN (GLUCOPHAGE) 500 MG tablet Take 500 mg by mouth 2 (two) times daily with a meal.    metoprolol tartrate (LOPRESSOR) 100 MG tablet Take 1 tablet (100 mg total) by mouth 2 (two) times daily.   Multiple Vitamins-Minerals (PRESERVISION AREDS 2) CAPS Take 2 capsules by mouth daily.   Naphazoline-Hydroxyprop Mcell 0.03-0.5 % SOLN Place 1 drop into both eyes 2 (two) times daily.   ondansetron (ZOFRAN) 8 MG tablet Take 8 mg by mouth every 6 (six) hours as needed for nausea or vomiting.   potassium chloride SA (K-DUR,KLOR-CON) 20 MEQ tablet Take 1.5 tablets (30 mEq total) by mouth daily.   Probiotic Product (RISA-BID PROBIOTIC) TABS Take 1 tablet by mouth daily.   vitamin B-12 (CYANOCOBALAMIN) 500 MCG tablet Take 500 mcg by mouth daily.   warfarin (COUMADIN) 5 MG tablet Take 5 mg by mouth daily. 5 mg daily except for Monday take 1/2 tablet   [DISCONTINUED] HYDROcodone-acetaminophen (NORCO/VICODIN) 5-325 MG  tablet Take 1 tablet by mouth every 6 (six) hours as needed for moderate pain or severe pain.   [DISCONTINUED] nystatin (MYCOSTATIN) 100000 UNIT/ML suspension Take 30 mLs by mouth 4 (four) times daily.   No facility-administered encounter medications on file as of 09/10/2021.     SIGNIFICANT DIAGNOSTIC EXAMS   PREVIOUS   08-26-21: chest x-ray:  Chronic interstitial thickening is unchanged. Lungs are otherwise clear. No pneumothorax or pleural effusion. Cardiac size within normal limits. Pulmonary vascularity is normal. Osseous structures  are age-appropriate. No acute bone abnormality.  08-27-21: left knee x-ray:  Left knee arthroplasty. Small to moderate suprapatellar knee joint effusion.  08-27-21: ct of head:  No evidence of acute intracranial abnormality. Mild small vessel ischemic changes.  08-28-21: bilateral lower extremity doppler No evidence of acute or chronic DVT within either lower extremity.  NO NEW EXAMS.    LABS REVIEWED PREVIOUS   08-26-21: wbc 14.3; hgb 13.4; hct 42.6; mcv 91.4 plt 126; glucose 159; bun 23; creat 0.76; k+ 4.3; na++ 136; ca 9.2 GFR >60; liver normal albumin 4.0; urine culture <10,000 blood culture: staphylococcus 08-27-21: wbc 8.6; hgb 11.5; hct 37.1 ;mcv 93.2 plt clump: glucose 146; bun 20; creat 0.61; k+ 3.6; na++ 138; ca 9.2; GFR>60; liver normal albumin 3.2; mag 1.8; blood culture: no growth; INR 1.9 08-30-21: wbc7.5; hgb 11.0; hct 35.7; mcv 93.9 plt 160; glucose  113; bun 14; creat 0.52; k+ 4.1; na++ 138; ca 8.4; GFR >60; INR 2.3   TODAY  09-10-21: INR 4.5 on 5 mg except 2.5 one time weekly    Review of Systems  Constitutional:  Negative for malaise/fatigue.  Respiratory:  Negative for cough and shortness of breath.   Cardiovascular:  Negative for chest pain, palpitations and leg swelling.  Gastrointestinal:  Negative for abdominal pain, constipation and heartburn.  Musculoskeletal:  Negative for back pain, joint pain and myalgias.   Skin: Negative.   Neurological:  Negative for dizziness.  Psychiatric/Behavioral:  The patient is not nervous/anxious.    Physical Exam Constitutional:      General: She is not in acute distress.    Appearance: She is well-developed. She is morbidly obese. She is not diaphoretic.  Neck:     Thyroid: No thyromegaly.  Cardiovascular:     Rate and Rhythm: Normal rate and regular rhythm.     Pulses: Normal pulses.     Heart sounds: Normal heart sounds.  Pulmonary:     Effort: Pulmonary effort is normal. No respiratory distress.     Breath sounds: Normal breath sounds.  Abdominal:     General: Bowel sounds are normal. There is no distension.     Palpations: Abdomen is soft.     Tenderness: There is no abdominal tenderness.  Musculoskeletal:        General: Normal range of motion.     Cervical back: Neck supple.     Right lower leg: Edema present.     Left lower leg: Edema present.     Comments:  2+ pitting pedal edema Lymphedema present bilateral lower extremities Limited ROM in bilateral lower extremities    Lymphadenopathy:     Cervical: No cervical adenopathy.  Skin:    General: Skin is warm and dry.     Comments:  Bilateral lower extremities discolored Scaly bilateral lower extremities  Neurological:     Mental Status: She is alert and oriented to person, place, and time.  Psychiatric:        Mood and Affect: Mood normal.     ASSESSMENT/ PLAN:  TODAY  Persistent atrial fibrillation Chronic anticoagulation   For INR 4.5 will hold coumadin and will recheck INR on 09-12-21.    Ok Edwards NP [facility]  Contact [phone] Monday through Friday 8am- 5pm  After hours call [phone]

## 2021-09-12 ENCOUNTER — Non-Acute Institutional Stay (SKILLED_NURSING_FACILITY): Payer: Medicare Other | Admitting: Adult Health

## 2021-09-12 ENCOUNTER — Other Ambulatory Visit: Payer: Self-pay | Admitting: Adult Health

## 2021-09-12 ENCOUNTER — Encounter: Payer: Self-pay | Admitting: Adult Health

## 2021-09-12 ENCOUNTER — Other Ambulatory Visit (HOSPITAL_COMMUNITY)
Admission: RE | Admit: 2021-09-12 | Discharge: 2021-09-12 | Disposition: A | Discharge status: Home / Self Care | Payer: Medicare Other | Source: Skilled Nursing Facility | Attending: Adult Health | Admitting: Adult Health

## 2021-09-12 DIAGNOSIS — A419 Sepsis, unspecified organism: Secondary | ICD-10-CM | POA: Diagnosis not present

## 2021-09-12 DIAGNOSIS — L03115 Cellulitis of right lower limb: Secondary | ICD-10-CM | POA: Diagnosis not present

## 2021-09-12 DIAGNOSIS — I4819 Other persistent atrial fibrillation: Secondary | ICD-10-CM | POA: Diagnosis not present

## 2021-09-12 DIAGNOSIS — R6520 Severe sepsis without septic shock: Secondary | ICD-10-CM

## 2021-09-12 DIAGNOSIS — I480 Paroxysmal atrial fibrillation: Secondary | ICD-10-CM | POA: Insufficient documentation

## 2021-09-12 LAB — PROTIME-INR
INR: 2.8 — ABNORMAL HIGH (ref 0.8–1.2)
Prothrombin Time: 29.2 seconds — ABNORMAL HIGH (ref 11.4–15.2)

## 2021-09-12 MED ORDER — POTASSIUM CHLORIDE CRYS ER 20 MEQ PO TBCR: 30.0000 meq | EXTENDED_RELEASE_TABLET | Freq: Every day | ORAL | 0 refills | Status: DC

## 2021-09-12 MED ORDER — LOSARTAN POTASSIUM 25 MG PO TABS: 25.0000 mg | ORAL_TABLET | Freq: Every day | ORAL | 0 refills | Status: DC

## 2021-09-12 MED ORDER — DILTIAZEM HCL 30 MG PO TABS: 30.0000 mg | ORAL_TABLET | Freq: Four times a day (QID) | ORAL | 0 refills | Status: DC | PRN

## 2021-09-12 MED ORDER — MAGNESIUM OXIDE 400 MG PO TABS: 400.0000 mg | ORAL_TABLET | Freq: Every day | ORAL | 0 refills | Status: DC

## 2021-09-12 MED ORDER — CLOBETASOL PROPIONATE 0.05 % EX CREA: 1.0000 "application " | TOPICAL_CREAM | CUTANEOUS | 0 refills | Status: DC | PRN

## 2021-09-12 MED ORDER — METOPROLOL TARTRATE 100 MG PO TABS: 100.0000 mg | ORAL_TABLET | Freq: Two times a day (BID) | ORAL | 0 refills | Status: DC

## 2021-09-12 MED ORDER — METFORMIN HCL 500 MG PO TABS: 500.0000 mg | ORAL_TABLET | Freq: Two times a day (BID) | ORAL | 0 refills | Status: DC

## 2021-09-12 MED ORDER — ONDANSETRON HCL 8 MG PO TABS: 8.0000 mg | ORAL_TABLET | Freq: Four times a day (QID) | ORAL | 0 refills | Status: DC | PRN

## 2021-09-12 MED ORDER — FUROSEMIDE 40 MG PO TABS: 60.0000 mg | ORAL_TABLET | Freq: Every day | ORAL | 0 refills | Status: DC

## 2021-09-12 MED ORDER — DOFETILIDE 250 MCG PO CAPS: 250.0000 ug | ORAL_CAPSULE | Freq: Two times a day (BID) | ORAL | 0 refills | Status: DC

## 2021-09-12 MED ORDER — WARFARIN SODIUM 5 MG PO TABS: 5.0000 mg | ORAL_TABLET | Freq: Every day | ORAL | 0 refills | Status: DC

## 2021-09-12 MED ORDER — ALLOPURINOL 300 MG PO TABS: 150.0000 mg | ORAL_TABLET | Freq: Every evening | ORAL | 0 refills | Status: DC

## 2021-09-12 NOTE — Progress Notes (Signed)
Location:  [place] Room Number: 151-P Place of Service:  SNF (31)   CODE STATUS: Full Code  Allergies  Allergen Reactions   Bactrim [Sulfamethoxazole-Trimethoprim]     Drop in BP   Morphine And Related Other (See Comments)    Unresponsive-Very bad reaction.  Can take hydrocodone.    Oysters [Shellfish Allergy] Shortness Of Breath, Itching, Swelling and Rash   Amlodipine Swelling and Other (See Comments)    Of legs   Percocet [Oxycodone-Acetaminophen] Other (See Comments)    hallucinations   Tdap [Tetanus-Diphth-Acell Pertussis] Itching, Swelling and Rash   Tetanus Toxoids Itching, Swelling and Rash   Tramadol Swelling and Other (See Comments)    Of legs   Carbamazepine Other (See Comments)   Lactose Intolerance (Gi)    Macrodantin [Nitrofurantoin] Other (See Comments)   Other Other (See Comments)   Tetanus-Diphtheria Toxoids Td Other (See Comments)   Topiramate Other (See Comments)   Diovan [Valsartan] Other (See Comments)    Cough   Diphtheria Toxoid Itching, Swelling and Rash   Macrodantin [Nitrofurantoin Macrocrystal] Other (See Comments)    unspecified    Chief Complaint  Patient presents with   Discharge Note    HPI:  She is being discharged to home health for pt/ot/rn. She will not need any dme. She will need her prescriptions written and will need to follow up with her medical provider. She had been hospitalized for sepsis due to right lower extremity cellulitis. She was admitted to this facility for short term rehab. She has participated in pt/ot to improve upon her level of independence of her adls. She is now ready to be discharged home.   Past Medical History:  Diagnosis Date   Anemia    Arthritis    DVT (deep venous thrombosis) ([place]) 01/2009   GERD (gastroesophageal reflux disease)    Gout    History of blood transfusion    1990's   History of colonic polyps 09/2010   History of pneumonia    Hyperlipidemia    Hypertension     Lactose intolerance    Macular degeneration, dry    bilateral   Migraine    Obesity    Osteopenia    PAF (paroxysmal atrial fibrillation) (HCC)    Peripheral neuropathy    Pulmonary embolism ([place]) 01/2009   Sleep apnea    CPAP pressure 4.0-16.0   Thoracic ascending aortic aneurysm    Type II diabetes mellitus (HCC)    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)    Urinary incontinence     Past Surgical History:  Procedure Laterality Date   [place] N/A 05/07/2018   Procedure: CARDIOVERSION;  Surgeon: Josue Hector, MD;  Location: [facility] ENDOSCOPY;  Service: Cardiovascular;  Laterality: N/A;   COLONOSCOPY W/ BIOPSIES AND POLYPECTOMY  2005; 2011; 2012   COLONOSCOPY WITH PROPOFOL N/A 09/10/2016   Procedure: COLONOSCOPY WITH PROPOFOL;  Surgeon: Garlan Fair, MD;  Location: [facility] ENDOSCOPY;  Service: Endoscopy;  Laterality: N/A;   JOINT REPLACEMENT     KNEE ARTHROSCOPY Left ~ [place] Left 2011   TOTAL KNEE ARTHROPLASTY Bilateral 2005-2012   left-right    Social History   Socioeconomic History   Marital status: Widowed    Spouse name: Not on file   Number of children: Not on file   Years of education: Not on file  Highest education level: Not on file  Occupational History   Not on file  Tobacco Use   Smoking status: Never   Smokeless tobacco: Never  Vaping Use   Vaping Use: Never used  Substance and Sexual Activity   Alcohol use: No   Drug use: No   Sexual activity: Not Currently  Other Topics Concern   Not on file  Social History Narrative   Not on file   Social Determinants of Health   Financial Resource Strain: Not on file  Food Insecurity: Not on file  Transportation Needs: Not on file  Physical Activity: Not on file  Stress: Not on file  Social Connections: Not on file  Intimate Partner Violence: Not on file    Family History  Problem Relation Age of Onset   Colon cancer Mother    Lung cancer Mother    Heart disease Father       VITAL SIGNS BP 130/70   Pulse 76   Temp 98 F (36.7 C)   Resp 18   Ht 5' 3"  (1.6 m)   SpO2 94%   BMI 49.78 kg/m   Outpatient Encounter Medications as of 09/12/2021  Medication Sig   acetaminophen (TYLENOL) 500 MG tablet Take 500 mg by mouth at bedtime.   allopurinol (ZYLOPRIM) 300 MG tablet Take 150 mg by mouth every evening.    Cholecalciferol (VITAMIN D3) 25 MCG (1000 UT) CAPS Take 3 capsules by mouth daily in the afternoon.   clobetasol cream (TEMOVATE) 6.43 % Apply 1 application topically as needed.   diltiazem (CARDIZEM) 30 MG tablet Take 1 tablet (30 mg total) by mouth every 6 (six) hours as needed (For atial fibrillation with HR greater than 100 bpm.).   dofetilide (TIKOSYN) 250 MCG capsule TAKE 1 CAPSULE BY MOUTH TWICE DAILY   furosemide (LASIX) 40 MG tablet Take 60 mg by mouth daily.   losartan (COZAAR) 25 MG tablet Take 25 mg by mouth daily.   magnesium oxide (MAG-OX) 400 MG tablet Take 400 mg by mouth at bedtime.   metFORMIN (GLUCOPHAGE) 500 MG tablet Take 500 mg by mouth 2 (two) times daily with a meal.    metoprolol tartrate (LOPRESSOR) 100 MG tablet Take 1 tablet (100 mg total) by mouth 2 (two) times daily.   Multiple Vitamins-Minerals (PRESERVISION AREDS 2) CAPS Take 2 capsules by mouth daily.   Naphazoline-Hydroxyprop Mcell 0.03-0.5 % SOLN Place 1 drop into both eyes 2 (two) times daily.   ondansetron (ZOFRAN) 8 MG tablet Take 8 mg by mouth every 6 (six) hours as needed for nausea or vomiting.   potassium chloride SA (K-DUR,KLOR-CON) 20 MEQ tablet Take 1.5 tablets (30 mEq total) by mouth daily.   Probiotic Product (RISA-BID PROBIOTIC) TABS Take 1 tablet by mouth daily.   vitamin B-12 (CYANOCOBALAMIN) 500 MCG tablet Take 500 mcg by mouth daily.   warfarin (COUMADIN) 5 MG tablet Take 5 mg by mouth daily. 5 mg daily except for Monday take 1/2  tablet   No facility-administered encounter medications on file as of 09/12/2021.     SIGNIFICANT DIAGNOSTIC EXAMS   PREVIOUS   08-26-21: chest x-ray:  Chronic interstitial thickening is unchanged. Lungs are otherwise clear. No pneumothorax or pleural effusion. Cardiac size within normal limits. Pulmonary vascularity is normal. Osseous structures are age-appropriate. No acute bone abnormality.  08-27-21: left knee x-ray:  Left knee arthroplasty. Small to moderate suprapatellar knee joint effusion.  08-27-21: ct of head:  No evidence of acute intracranial abnormality. Mild small  vessel ischemic changes.  08-28-21: bilateral lower extremity doppler No evidence of acute or chronic DVT within either lower extremity.  NO NEW EXAMS.    LABS REVIEWED PREVIOUS   08-26-21: wbc 14.3; hgb 13.4; hct 42.6; mcv 91.4 plt 126; glucose 159; bun 23; creat 0.76; k+ 4.3; na++ 136; ca 9.2 GFR >60; liver normal albumin 4.0; urine culture <10,000 blood culture: staphylococcus 08-27-21: wbc 8.6; hgb 11.5; hct 37.1 ;mcv 93.2 plt clump: glucose 146; bun 20; creat 0.61; k+ 3.6; na++ 138; ca 9.2; GFR>60; liver normal albumin 3.2; mag 1.8; blood culture: no growth; INR 1.9 08-30-21: wbc7.5; hgb 11.0; hct 35.7; mcv 93.9 plt 160; glucose  113; bun 14; creat 0.52; k+ 4.1; na++ 138; ca 8.4; GFR >60; INR 2.3  09-10-21: INR 4.5 on 5 mg except 2.5 one time weekly  NO NEW LABS.    Review of Systems  Constitutional:  Negative for malaise/fatigue.  Respiratory:  Negative for cough and shortness of breath.   Cardiovascular:  Negative for chest pain, palpitations and leg swelling.  Gastrointestinal:  Negative for abdominal pain, constipation and heartburn.  Musculoskeletal:  Negative for back pain, joint pain and myalgias.  Skin: Negative.   Neurological:  Negative for dizziness.  Psychiatric/Behavioral:  The patient is not nervous/anxious.    Physical Exam Constitutional:      General: She is not in acute  distress.    Appearance: She is well-developed. She is morbidly obese. She is not diaphoretic.  Neck:     Thyroid: No thyromegaly.  Cardiovascular:     Rate and Rhythm: Normal rate and regular rhythm.     Pulses: Normal pulses.     Heart sounds: Normal heart sounds.  Pulmonary:     Effort: Pulmonary effort is normal. No respiratory distress.     Breath sounds: Normal breath sounds.  Abdominal:     General: Bowel sounds are normal. There is no distension.     Palpations: Abdomen is soft.     Tenderness: There is no abdominal tenderness.  Musculoskeletal:     Cervical back: Neck supple.     Right lower leg: Edema present.     Left lower leg: Edema present.     Comments:  2+ pitting pedal edema Lymphedema present bilateral lower extremities Limited ROM in bilateral lower extremities   Lymphadenopathy:     Cervical: No cervical adenopathy.  Skin:    General: Skin is warm and dry.     Comments: Bilateral lower extremities discolored   Neurological:     Mental Status: She is alert and oriented to person, place, and time.  Psychiatric:        Mood and Affect: Mood normal.     ASSESSMENT/ PLAN:  Discharge orders:   Home health: pt/ot/rn: to evaluate and treat as indicated for gait balance strength adl training medication management  DME: none needed  30 day supply of her prescription medications have been sent to [facility] in [place]  Time spent with patient: 35 minutes: home health dme; medications.    Ok Edwards NP [facility]  Contact [phone] Monday through Friday 8am- 5pm  After hours call [phone]

## 2021-09-14 ENCOUNTER — Other Ambulatory Visit: Payer: Self-pay | Admitting: Adult Health

## 2021-09-14 MED ORDER — ONDANSETRON HCL 8 MG PO TABS: 8.0000 mg | ORAL_TABLET | Freq: Four times a day (QID) | ORAL | 0 refills | Status: DC | PRN

## 2021-09-14 MED ORDER — LOSARTAN POTASSIUM 25 MG PO TABS: 25.0000 mg | ORAL_TABLET | Freq: Every day | ORAL | 0 refills | Status: DC

## 2021-09-14 MED ORDER — METFORMIN HCL 500 MG PO TABS: 500.0000 mg | ORAL_TABLET | Freq: Two times a day (BID) | ORAL | 0 refills | Status: AC

## 2021-09-14 MED ORDER — METOPROLOL TARTRATE 100 MG PO TABS: 100.0000 mg | ORAL_TABLET | Freq: Two times a day (BID) | ORAL | 0 refills | Status: DC

## 2021-09-14 MED ORDER — CLOBETASOL PROPIONATE 0.05 % EX CREA: 1.0000 "application " | TOPICAL_CREAM | CUTANEOUS | 0 refills | Status: DC | PRN

## 2021-09-14 MED ORDER — WARFARIN SODIUM 5 MG PO TABS: 5.0000 mg | ORAL_TABLET | Freq: Every day | ORAL | 0 refills | Status: DC

## 2021-09-14 MED ORDER — POTASSIUM CHLORIDE CRYS ER 20 MEQ PO TBCR: 30.0000 meq | EXTENDED_RELEASE_TABLET | Freq: Every day | ORAL | 0 refills | Status: DC

## 2021-09-14 MED ORDER — FUROSEMIDE 40 MG PO TABS: 60.0000 mg | ORAL_TABLET | Freq: Every day | ORAL | 0 refills | Status: DC

## 2021-09-14 MED ORDER — ALLOPURINOL 300 MG PO TABS: 150.0000 mg | ORAL_TABLET | Freq: Every evening | ORAL | 0 refills | Status: AC

## 2021-09-14 MED ORDER — DILTIAZEM HCL 30 MG PO TABS: 30.0000 mg | ORAL_TABLET | Freq: Four times a day (QID) | ORAL | 0 refills | Status: DC | PRN

## 2021-09-17 DIAGNOSIS — I2602 Saddle embolus of pulmonary artery with acute cor pulmonale: Secondary | ICD-10-CM | POA: Diagnosis not present

## 2021-09-17 DIAGNOSIS — D649 Anemia, unspecified: Secondary | ICD-10-CM | POA: Diagnosis not present

## 2021-09-17 DIAGNOSIS — D6859 Other primary thrombophilia: Secondary | ICD-10-CM | POA: Diagnosis not present

## 2021-09-17 DIAGNOSIS — I110 Hypertensive heart disease with heart failure: Secondary | ICD-10-CM | POA: Diagnosis not present

## 2021-09-17 DIAGNOSIS — Z7984 Long term (current) use of oral hypoglycemic drugs: Secondary | ICD-10-CM | POA: Diagnosis not present

## 2021-09-17 DIAGNOSIS — A419 Sepsis, unspecified organism: Secondary | ICD-10-CM | POA: Diagnosis not present

## 2021-09-17 DIAGNOSIS — Z9181 History of falling: Secondary | ICD-10-CM | POA: Diagnosis not present

## 2021-09-17 DIAGNOSIS — I5032 Chronic diastolic (congestive) heart failure: Secondary | ICD-10-CM | POA: Diagnosis not present

## 2021-09-17 DIAGNOSIS — Z7901 Long term (current) use of anticoagulants: Secondary | ICD-10-CM | POA: Diagnosis not present

## 2021-09-17 DIAGNOSIS — E785 Hyperlipidemia, unspecified: Secondary | ICD-10-CM | POA: Diagnosis not present

## 2021-09-17 DIAGNOSIS — E1165 Type 2 diabetes mellitus with hyperglycemia: Secondary | ICD-10-CM | POA: Diagnosis not present

## 2021-09-17 DIAGNOSIS — L03115 Cellulitis of right lower limb: Secondary | ICD-10-CM | POA: Diagnosis not present

## 2021-09-17 DIAGNOSIS — G4730 Sleep apnea, unspecified: Secondary | ICD-10-CM | POA: Diagnosis not present

## 2021-09-17 DIAGNOSIS — K219 Gastro-esophageal reflux disease without esophagitis: Secondary | ICD-10-CM | POA: Diagnosis not present

## 2021-09-17 DIAGNOSIS — K5190 Ulcerative colitis, unspecified, without complications: Secondary | ICD-10-CM | POA: Diagnosis not present

## 2021-09-17 DIAGNOSIS — M8580 Other specified disorders of bone density and structure, unspecified site: Secondary | ICD-10-CM | POA: Diagnosis not present

## 2021-09-17 DIAGNOSIS — E1142 Type 2 diabetes mellitus with diabetic polyneuropathy: Secondary | ICD-10-CM | POA: Diagnosis not present

## 2021-09-17 DIAGNOSIS — M519 Unspecified thoracic, thoracolumbar and lumbosacral intervertebral disc disorder: Secondary | ICD-10-CM | POA: Diagnosis not present

## 2021-09-17 DIAGNOSIS — I890 Lymphedema, not elsewhere classified: Secondary | ICD-10-CM | POA: Diagnosis not present

## 2021-09-17 DIAGNOSIS — I480 Paroxysmal atrial fibrillation: Secondary | ICD-10-CM | POA: Diagnosis not present

## 2021-09-17 DIAGNOSIS — H353130 Nonexudative age-related macular degeneration, bilateral, stage unspecified: Secondary | ICD-10-CM | POA: Diagnosis not present

## 2021-09-17 DIAGNOSIS — M1990 Unspecified osteoarthritis, unspecified site: Secondary | ICD-10-CM | POA: Diagnosis not present

## 2021-09-17 DIAGNOSIS — I712 Thoracic aortic aneurysm, without rupture, unspecified: Secondary | ICD-10-CM | POA: Diagnosis not present

## 2021-09-18 DIAGNOSIS — Z7984 Long term (current) use of oral hypoglycemic drugs: Secondary | ICD-10-CM | POA: Diagnosis not present

## 2021-09-18 DIAGNOSIS — Z7901 Long term (current) use of anticoagulants: Secondary | ICD-10-CM | POA: Diagnosis not present

## 2021-09-18 DIAGNOSIS — D6859 Other primary thrombophilia: Secondary | ICD-10-CM | POA: Diagnosis not present

## 2021-09-18 DIAGNOSIS — E785 Hyperlipidemia, unspecified: Secondary | ICD-10-CM | POA: Diagnosis not present

## 2021-09-18 DIAGNOSIS — K219 Gastro-esophageal reflux disease without esophagitis: Secondary | ICD-10-CM | POA: Diagnosis not present

## 2021-09-18 DIAGNOSIS — E1165 Type 2 diabetes mellitus with hyperglycemia: Secondary | ICD-10-CM | POA: Diagnosis not present

## 2021-09-18 DIAGNOSIS — I2602 Saddle embolus of pulmonary artery with acute cor pulmonale: Secondary | ICD-10-CM | POA: Diagnosis not present

## 2021-09-18 DIAGNOSIS — I712 Thoracic aortic aneurysm, without rupture, unspecified: Secondary | ICD-10-CM | POA: Diagnosis not present

## 2021-09-18 DIAGNOSIS — H353130 Nonexudative age-related macular degeneration, bilateral, stage unspecified: Secondary | ICD-10-CM | POA: Diagnosis not present

## 2021-09-18 DIAGNOSIS — E1142 Type 2 diabetes mellitus with diabetic polyneuropathy: Secondary | ICD-10-CM | POA: Diagnosis not present

## 2021-09-18 DIAGNOSIS — M519 Unspecified thoracic, thoracolumbar and lumbosacral intervertebral disc disorder: Secondary | ICD-10-CM | POA: Diagnosis not present

## 2021-09-18 DIAGNOSIS — M1990 Unspecified osteoarthritis, unspecified site: Secondary | ICD-10-CM | POA: Diagnosis not present

## 2021-09-18 DIAGNOSIS — I480 Paroxysmal atrial fibrillation: Secondary | ICD-10-CM | POA: Diagnosis not present

## 2021-09-18 DIAGNOSIS — K5190 Ulcerative colitis, unspecified, without complications: Secondary | ICD-10-CM | POA: Diagnosis not present

## 2021-09-18 DIAGNOSIS — L03115 Cellulitis of right lower limb: Secondary | ICD-10-CM | POA: Diagnosis not present

## 2021-09-18 DIAGNOSIS — I890 Lymphedema, not elsewhere classified: Secondary | ICD-10-CM | POA: Diagnosis not present

## 2021-09-18 DIAGNOSIS — Z9181 History of falling: Secondary | ICD-10-CM | POA: Diagnosis not present

## 2021-09-18 DIAGNOSIS — I110 Hypertensive heart disease with heart failure: Secondary | ICD-10-CM | POA: Diagnosis not present

## 2021-09-18 DIAGNOSIS — M8580 Other specified disorders of bone density and structure, unspecified site: Secondary | ICD-10-CM | POA: Diagnosis not present

## 2021-09-18 DIAGNOSIS — I5032 Chronic diastolic (congestive) heart failure: Secondary | ICD-10-CM | POA: Diagnosis not present

## 2021-09-18 DIAGNOSIS — A419 Sepsis, unspecified organism: Secondary | ICD-10-CM | POA: Diagnosis not present

## 2021-09-18 DIAGNOSIS — D649 Anemia, unspecified: Secondary | ICD-10-CM | POA: Diagnosis not present

## 2021-09-18 DIAGNOSIS — G4730 Sleep apnea, unspecified: Secondary | ICD-10-CM | POA: Diagnosis not present

## 2021-09-19 DIAGNOSIS — H353130 Nonexudative age-related macular degeneration, bilateral, stage unspecified: Secondary | ICD-10-CM | POA: Diagnosis not present

## 2021-09-19 DIAGNOSIS — K5190 Ulcerative colitis, unspecified, without complications: Secondary | ICD-10-CM | POA: Diagnosis not present

## 2021-09-19 DIAGNOSIS — Z7984 Long term (current) use of oral hypoglycemic drugs: Secondary | ICD-10-CM | POA: Diagnosis not present

## 2021-09-19 DIAGNOSIS — G4730 Sleep apnea, unspecified: Secondary | ICD-10-CM | POA: Diagnosis not present

## 2021-09-19 DIAGNOSIS — E785 Hyperlipidemia, unspecified: Secondary | ICD-10-CM | POA: Diagnosis not present

## 2021-09-19 DIAGNOSIS — I5032 Chronic diastolic (congestive) heart failure: Secondary | ICD-10-CM | POA: Diagnosis not present

## 2021-09-19 DIAGNOSIS — K219 Gastro-esophageal reflux disease without esophagitis: Secondary | ICD-10-CM | POA: Diagnosis not present

## 2021-09-19 DIAGNOSIS — I110 Hypertensive heart disease with heart failure: Secondary | ICD-10-CM | POA: Diagnosis not present

## 2021-09-19 DIAGNOSIS — I480 Paroxysmal atrial fibrillation: Secondary | ICD-10-CM | POA: Diagnosis not present

## 2021-09-19 DIAGNOSIS — Z9181 History of falling: Secondary | ICD-10-CM | POA: Diagnosis not present

## 2021-09-19 DIAGNOSIS — M519 Unspecified thoracic, thoracolumbar and lumbosacral intervertebral disc disorder: Secondary | ICD-10-CM | POA: Diagnosis not present

## 2021-09-19 DIAGNOSIS — D6859 Other primary thrombophilia: Secondary | ICD-10-CM | POA: Diagnosis not present

## 2021-09-19 DIAGNOSIS — L03115 Cellulitis of right lower limb: Secondary | ICD-10-CM | POA: Diagnosis not present

## 2021-09-19 DIAGNOSIS — A419 Sepsis, unspecified organism: Secondary | ICD-10-CM | POA: Diagnosis not present

## 2021-09-19 DIAGNOSIS — M8580 Other specified disorders of bone density and structure, unspecified site: Secondary | ICD-10-CM | POA: Diagnosis not present

## 2021-09-19 DIAGNOSIS — E1142 Type 2 diabetes mellitus with diabetic polyneuropathy: Secondary | ICD-10-CM | POA: Diagnosis not present

## 2021-09-19 DIAGNOSIS — I2602 Saddle embolus of pulmonary artery with acute cor pulmonale: Secondary | ICD-10-CM | POA: Diagnosis not present

## 2021-09-19 DIAGNOSIS — Z7901 Long term (current) use of anticoagulants: Secondary | ICD-10-CM | POA: Diagnosis not present

## 2021-09-19 DIAGNOSIS — I712 Thoracic aortic aneurysm, without rupture, unspecified: Secondary | ICD-10-CM | POA: Diagnosis not present

## 2021-09-19 DIAGNOSIS — D649 Anemia, unspecified: Secondary | ICD-10-CM | POA: Diagnosis not present

## 2021-09-19 DIAGNOSIS — I890 Lymphedema, not elsewhere classified: Secondary | ICD-10-CM | POA: Diagnosis not present

## 2021-09-19 DIAGNOSIS — M1990 Unspecified osteoarthritis, unspecified site: Secondary | ICD-10-CM | POA: Diagnosis not present

## 2021-09-19 DIAGNOSIS — E1165 Type 2 diabetes mellitus with hyperglycemia: Secondary | ICD-10-CM | POA: Diagnosis not present

## 2021-09-20 DIAGNOSIS — A419 Sepsis, unspecified organism: Secondary | ICD-10-CM | POA: Diagnosis not present

## 2021-09-20 DIAGNOSIS — Z7984 Long term (current) use of oral hypoglycemic drugs: Secondary | ICD-10-CM | POA: Diagnosis not present

## 2021-09-20 DIAGNOSIS — E1165 Type 2 diabetes mellitus with hyperglycemia: Secondary | ICD-10-CM | POA: Diagnosis not present

## 2021-09-20 DIAGNOSIS — M519 Unspecified thoracic, thoracolumbar and lumbosacral intervertebral disc disorder: Secondary | ICD-10-CM | POA: Diagnosis not present

## 2021-09-20 DIAGNOSIS — I712 Thoracic aortic aneurysm, without rupture, unspecified: Secondary | ICD-10-CM | POA: Diagnosis not present

## 2021-09-20 DIAGNOSIS — E1142 Type 2 diabetes mellitus with diabetic polyneuropathy: Secondary | ICD-10-CM | POA: Diagnosis not present

## 2021-09-20 DIAGNOSIS — M1990 Unspecified osteoarthritis, unspecified site: Secondary | ICD-10-CM | POA: Diagnosis not present

## 2021-09-20 DIAGNOSIS — E785 Hyperlipidemia, unspecified: Secondary | ICD-10-CM | POA: Diagnosis not present

## 2021-09-20 DIAGNOSIS — K5190 Ulcerative colitis, unspecified, without complications: Secondary | ICD-10-CM | POA: Diagnosis not present

## 2021-09-20 DIAGNOSIS — H353130 Nonexudative age-related macular degeneration, bilateral, stage unspecified: Secondary | ICD-10-CM | POA: Diagnosis not present

## 2021-09-20 DIAGNOSIS — I480 Paroxysmal atrial fibrillation: Secondary | ICD-10-CM | POA: Diagnosis not present

## 2021-09-20 DIAGNOSIS — K219 Gastro-esophageal reflux disease without esophagitis: Secondary | ICD-10-CM | POA: Diagnosis not present

## 2021-09-20 DIAGNOSIS — M8580 Other specified disorders of bone density and structure, unspecified site: Secondary | ICD-10-CM | POA: Diagnosis not present

## 2021-09-20 DIAGNOSIS — D649 Anemia, unspecified: Secondary | ICD-10-CM | POA: Diagnosis not present

## 2021-09-20 DIAGNOSIS — I110 Hypertensive heart disease with heart failure: Secondary | ICD-10-CM | POA: Diagnosis not present

## 2021-09-20 DIAGNOSIS — I2602 Saddle embolus of pulmonary artery with acute cor pulmonale: Secondary | ICD-10-CM | POA: Diagnosis not present

## 2021-09-20 DIAGNOSIS — I5032 Chronic diastolic (congestive) heart failure: Secondary | ICD-10-CM | POA: Diagnosis not present

## 2021-09-20 DIAGNOSIS — I890 Lymphedema, not elsewhere classified: Secondary | ICD-10-CM | POA: Diagnosis not present

## 2021-09-20 DIAGNOSIS — Z7901 Long term (current) use of anticoagulants: Secondary | ICD-10-CM | POA: Diagnosis not present

## 2021-09-20 DIAGNOSIS — D6859 Other primary thrombophilia: Secondary | ICD-10-CM | POA: Diagnosis not present

## 2021-09-20 DIAGNOSIS — L03115 Cellulitis of right lower limb: Secondary | ICD-10-CM | POA: Diagnosis not present

## 2021-09-20 DIAGNOSIS — G4730 Sleep apnea, unspecified: Secondary | ICD-10-CM | POA: Diagnosis not present

## 2021-09-20 DIAGNOSIS — Z9181 History of falling: Secondary | ICD-10-CM | POA: Diagnosis not present

## 2021-09-21 DIAGNOSIS — M8580 Other specified disorders of bone density and structure, unspecified site: Secondary | ICD-10-CM | POA: Diagnosis not present

## 2021-09-21 DIAGNOSIS — I5032 Chronic diastolic (congestive) heart failure: Secondary | ICD-10-CM | POA: Diagnosis not present

## 2021-09-21 DIAGNOSIS — G4730 Sleep apnea, unspecified: Secondary | ICD-10-CM | POA: Diagnosis not present

## 2021-09-21 DIAGNOSIS — I480 Paroxysmal atrial fibrillation: Secondary | ICD-10-CM | POA: Diagnosis not present

## 2021-09-21 DIAGNOSIS — I712 Thoracic aortic aneurysm, without rupture, unspecified: Secondary | ICD-10-CM | POA: Diagnosis not present

## 2021-09-21 DIAGNOSIS — L03115 Cellulitis of right lower limb: Secondary | ICD-10-CM | POA: Diagnosis not present

## 2021-09-21 DIAGNOSIS — I2602 Saddle embolus of pulmonary artery with acute cor pulmonale: Secondary | ICD-10-CM | POA: Diagnosis not present

## 2021-09-21 DIAGNOSIS — Z7984 Long term (current) use of oral hypoglycemic drugs: Secondary | ICD-10-CM | POA: Diagnosis not present

## 2021-09-21 DIAGNOSIS — A419 Sepsis, unspecified organism: Secondary | ICD-10-CM | POA: Diagnosis not present

## 2021-09-21 DIAGNOSIS — K219 Gastro-esophageal reflux disease without esophagitis: Secondary | ICD-10-CM | POA: Diagnosis not present

## 2021-09-21 DIAGNOSIS — E785 Hyperlipidemia, unspecified: Secondary | ICD-10-CM | POA: Diagnosis not present

## 2021-09-21 DIAGNOSIS — M1990 Unspecified osteoarthritis, unspecified site: Secondary | ICD-10-CM | POA: Diagnosis not present

## 2021-09-21 DIAGNOSIS — I890 Lymphedema, not elsewhere classified: Secondary | ICD-10-CM | POA: Diagnosis not present

## 2021-09-21 DIAGNOSIS — Z9181 History of falling: Secondary | ICD-10-CM | POA: Diagnosis not present

## 2021-09-21 DIAGNOSIS — I110 Hypertensive heart disease with heart failure: Secondary | ICD-10-CM | POA: Diagnosis not present

## 2021-09-21 DIAGNOSIS — H353130 Nonexudative age-related macular degeneration, bilateral, stage unspecified: Secondary | ICD-10-CM | POA: Diagnosis not present

## 2021-09-21 DIAGNOSIS — E1142 Type 2 diabetes mellitus with diabetic polyneuropathy: Secondary | ICD-10-CM | POA: Diagnosis not present

## 2021-09-21 DIAGNOSIS — K5190 Ulcerative colitis, unspecified, without complications: Secondary | ICD-10-CM | POA: Diagnosis not present

## 2021-09-21 DIAGNOSIS — M519 Unspecified thoracic, thoracolumbar and lumbosacral intervertebral disc disorder: Secondary | ICD-10-CM | POA: Diagnosis not present

## 2021-09-21 DIAGNOSIS — E1165 Type 2 diabetes mellitus with hyperglycemia: Secondary | ICD-10-CM | POA: Diagnosis not present

## 2021-09-21 DIAGNOSIS — D649 Anemia, unspecified: Secondary | ICD-10-CM | POA: Diagnosis not present

## 2021-09-21 DIAGNOSIS — Z7901 Long term (current) use of anticoagulants: Secondary | ICD-10-CM | POA: Diagnosis not present

## 2021-09-21 DIAGNOSIS — D6859 Other primary thrombophilia: Secondary | ICD-10-CM | POA: Diagnosis not present

## 2021-09-24 DIAGNOSIS — E1142 Type 2 diabetes mellitus with diabetic polyneuropathy: Secondary | ICD-10-CM | POA: Diagnosis not present

## 2021-09-24 DIAGNOSIS — I890 Lymphedema, not elsewhere classified: Secondary | ICD-10-CM | POA: Diagnosis not present

## 2021-09-24 DIAGNOSIS — I2602 Saddle embolus of pulmonary artery with acute cor pulmonale: Secondary | ICD-10-CM | POA: Diagnosis not present

## 2021-09-24 DIAGNOSIS — H353130 Nonexudative age-related macular degeneration, bilateral, stage unspecified: Secondary | ICD-10-CM | POA: Diagnosis not present

## 2021-09-24 DIAGNOSIS — I712 Thoracic aortic aneurysm, without rupture, unspecified: Secondary | ICD-10-CM | POA: Diagnosis not present

## 2021-09-24 DIAGNOSIS — I110 Hypertensive heart disease with heart failure: Secondary | ICD-10-CM | POA: Diagnosis not present

## 2021-09-24 DIAGNOSIS — K5190 Ulcerative colitis, unspecified, without complications: Secondary | ICD-10-CM | POA: Diagnosis not present

## 2021-09-24 DIAGNOSIS — L03115 Cellulitis of right lower limb: Secondary | ICD-10-CM | POA: Diagnosis not present

## 2021-09-24 DIAGNOSIS — M519 Unspecified thoracic, thoracolumbar and lumbosacral intervertebral disc disorder: Secondary | ICD-10-CM | POA: Diagnosis not present

## 2021-09-24 DIAGNOSIS — K219 Gastro-esophageal reflux disease without esophagitis: Secondary | ICD-10-CM | POA: Diagnosis not present

## 2021-09-24 DIAGNOSIS — A419 Sepsis, unspecified organism: Secondary | ICD-10-CM | POA: Diagnosis not present

## 2021-09-24 DIAGNOSIS — D649 Anemia, unspecified: Secondary | ICD-10-CM | POA: Diagnosis not present

## 2021-09-24 DIAGNOSIS — Z9181 History of falling: Secondary | ICD-10-CM | POA: Diagnosis not present

## 2021-09-24 DIAGNOSIS — E1165 Type 2 diabetes mellitus with hyperglycemia: Secondary | ICD-10-CM | POA: Diagnosis not present

## 2021-09-24 DIAGNOSIS — M8580 Other specified disorders of bone density and structure, unspecified site: Secondary | ICD-10-CM | POA: Diagnosis not present

## 2021-09-24 DIAGNOSIS — M1990 Unspecified osteoarthritis, unspecified site: Secondary | ICD-10-CM | POA: Diagnosis not present

## 2021-09-24 DIAGNOSIS — I5032 Chronic diastolic (congestive) heart failure: Secondary | ICD-10-CM | POA: Diagnosis not present

## 2021-09-24 DIAGNOSIS — G4730 Sleep apnea, unspecified: Secondary | ICD-10-CM | POA: Diagnosis not present

## 2021-09-24 DIAGNOSIS — I480 Paroxysmal atrial fibrillation: Secondary | ICD-10-CM | POA: Diagnosis not present

## 2021-09-24 DIAGNOSIS — D6859 Other primary thrombophilia: Secondary | ICD-10-CM | POA: Diagnosis not present

## 2021-09-24 DIAGNOSIS — E785 Hyperlipidemia, unspecified: Secondary | ICD-10-CM | POA: Diagnosis not present

## 2021-09-24 DIAGNOSIS — Z7984 Long term (current) use of oral hypoglycemic drugs: Secondary | ICD-10-CM | POA: Diagnosis not present

## 2021-09-24 DIAGNOSIS — Z7901 Long term (current) use of anticoagulants: Secondary | ICD-10-CM | POA: Diagnosis not present

## 2021-09-26 DIAGNOSIS — G4730 Sleep apnea, unspecified: Secondary | ICD-10-CM | POA: Diagnosis not present

## 2021-09-26 DIAGNOSIS — L03115 Cellulitis of right lower limb: Secondary | ICD-10-CM | POA: Diagnosis not present

## 2021-09-26 DIAGNOSIS — Z9181 History of falling: Secondary | ICD-10-CM | POA: Diagnosis not present

## 2021-09-26 DIAGNOSIS — M1990 Unspecified osteoarthritis, unspecified site: Secondary | ICD-10-CM | POA: Diagnosis not present

## 2021-09-26 DIAGNOSIS — D649 Anemia, unspecified: Secondary | ICD-10-CM | POA: Diagnosis not present

## 2021-09-26 DIAGNOSIS — A419 Sepsis, unspecified organism: Secondary | ICD-10-CM | POA: Diagnosis not present

## 2021-09-26 DIAGNOSIS — H353130 Nonexudative age-related macular degeneration, bilateral, stage unspecified: Secondary | ICD-10-CM | POA: Diagnosis not present

## 2021-09-26 DIAGNOSIS — E1142 Type 2 diabetes mellitus with diabetic polyneuropathy: Secondary | ICD-10-CM | POA: Diagnosis not present

## 2021-09-26 DIAGNOSIS — E785 Hyperlipidemia, unspecified: Secondary | ICD-10-CM | POA: Diagnosis not present

## 2021-09-26 DIAGNOSIS — D6859 Other primary thrombophilia: Secondary | ICD-10-CM | POA: Diagnosis not present

## 2021-09-26 DIAGNOSIS — E1165 Type 2 diabetes mellitus with hyperglycemia: Secondary | ICD-10-CM | POA: Diagnosis not present

## 2021-09-26 DIAGNOSIS — I110 Hypertensive heart disease with heart failure: Secondary | ICD-10-CM | POA: Diagnosis not present

## 2021-09-26 DIAGNOSIS — M8580 Other specified disorders of bone density and structure, unspecified site: Secondary | ICD-10-CM | POA: Diagnosis not present

## 2021-09-26 DIAGNOSIS — Z7984 Long term (current) use of oral hypoglycemic drugs: Secondary | ICD-10-CM | POA: Diagnosis not present

## 2021-09-26 DIAGNOSIS — I712 Thoracic aortic aneurysm, without rupture, unspecified: Secondary | ICD-10-CM | POA: Diagnosis not present

## 2021-09-26 DIAGNOSIS — I480 Paroxysmal atrial fibrillation: Secondary | ICD-10-CM | POA: Diagnosis not present

## 2021-09-26 DIAGNOSIS — Z7901 Long term (current) use of anticoagulants: Secondary | ICD-10-CM | POA: Diagnosis not present

## 2021-09-26 DIAGNOSIS — K5190 Ulcerative colitis, unspecified, without complications: Secondary | ICD-10-CM | POA: Diagnosis not present

## 2021-09-26 DIAGNOSIS — K219 Gastro-esophageal reflux disease without esophagitis: Secondary | ICD-10-CM | POA: Diagnosis not present

## 2021-09-26 DIAGNOSIS — I5032 Chronic diastolic (congestive) heart failure: Secondary | ICD-10-CM | POA: Diagnosis not present

## 2021-09-26 DIAGNOSIS — I2602 Saddle embolus of pulmonary artery with acute cor pulmonale: Secondary | ICD-10-CM | POA: Diagnosis not present

## 2021-09-26 DIAGNOSIS — I890 Lymphedema, not elsewhere classified: Secondary | ICD-10-CM | POA: Diagnosis not present

## 2021-09-26 DIAGNOSIS — M519 Unspecified thoracic, thoracolumbar and lumbosacral intervertebral disc disorder: Secondary | ICD-10-CM | POA: Diagnosis not present

## 2021-09-27 DIAGNOSIS — H353130 Nonexudative age-related macular degeneration, bilateral, stage unspecified: Secondary | ICD-10-CM | POA: Diagnosis not present

## 2021-09-27 DIAGNOSIS — I10 Essential (primary) hypertension: Secondary | ICD-10-CM | POA: Diagnosis not present

## 2021-09-27 DIAGNOSIS — D6859 Other primary thrombophilia: Secondary | ICD-10-CM | POA: Diagnosis not present

## 2021-09-27 DIAGNOSIS — M1990 Unspecified osteoarthritis, unspecified site: Secondary | ICD-10-CM | POA: Diagnosis not present

## 2021-09-27 DIAGNOSIS — I480 Paroxysmal atrial fibrillation: Secondary | ICD-10-CM | POA: Diagnosis not present

## 2021-09-27 DIAGNOSIS — E1142 Type 2 diabetes mellitus with diabetic polyneuropathy: Secondary | ICD-10-CM | POA: Diagnosis not present

## 2021-09-27 DIAGNOSIS — K219 Gastro-esophageal reflux disease without esophagitis: Secondary | ICD-10-CM | POA: Diagnosis not present

## 2021-09-27 DIAGNOSIS — Z9181 History of falling: Secondary | ICD-10-CM | POA: Diagnosis not present

## 2021-09-27 DIAGNOSIS — K5190 Ulcerative colitis, unspecified, without complications: Secondary | ICD-10-CM | POA: Diagnosis not present

## 2021-09-27 DIAGNOSIS — Z7984 Long term (current) use of oral hypoglycemic drugs: Secondary | ICD-10-CM | POA: Diagnosis not present

## 2021-09-27 DIAGNOSIS — I872 Venous insufficiency (chronic) (peripheral): Secondary | ICD-10-CM | POA: Diagnosis not present

## 2021-09-27 DIAGNOSIS — D649 Anemia, unspecified: Secondary | ICD-10-CM | POA: Diagnosis not present

## 2021-09-27 DIAGNOSIS — M8580 Other specified disorders of bone density and structure, unspecified site: Secondary | ICD-10-CM | POA: Diagnosis not present

## 2021-09-27 DIAGNOSIS — I5032 Chronic diastolic (congestive) heart failure: Secondary | ICD-10-CM | POA: Diagnosis not present

## 2021-09-27 DIAGNOSIS — E1165 Type 2 diabetes mellitus with hyperglycemia: Secondary | ICD-10-CM | POA: Diagnosis not present

## 2021-09-27 DIAGNOSIS — L03115 Cellulitis of right lower limb: Secondary | ICD-10-CM | POA: Diagnosis not present

## 2021-09-27 DIAGNOSIS — Z7901 Long term (current) use of anticoagulants: Secondary | ICD-10-CM | POA: Diagnosis not present

## 2021-09-27 DIAGNOSIS — G4730 Sleep apnea, unspecified: Secondary | ICD-10-CM | POA: Diagnosis not present

## 2021-09-27 DIAGNOSIS — E785 Hyperlipidemia, unspecified: Secondary | ICD-10-CM | POA: Diagnosis not present

## 2021-09-27 DIAGNOSIS — I712 Thoracic aortic aneurysm, without rupture, unspecified: Secondary | ICD-10-CM | POA: Diagnosis not present

## 2021-09-27 DIAGNOSIS — I110 Hypertensive heart disease with heart failure: Secondary | ICD-10-CM | POA: Diagnosis not present

## 2021-09-27 DIAGNOSIS — I2602 Saddle embolus of pulmonary artery with acute cor pulmonale: Secondary | ICD-10-CM | POA: Diagnosis not present

## 2021-09-27 DIAGNOSIS — I4891 Unspecified atrial fibrillation: Secondary | ICD-10-CM | POA: Diagnosis not present

## 2021-09-27 DIAGNOSIS — A419 Sepsis, unspecified organism: Secondary | ICD-10-CM | POA: Diagnosis not present

## 2021-09-27 DIAGNOSIS — M519 Unspecified thoracic, thoracolumbar and lumbosacral intervertebral disc disorder: Secondary | ICD-10-CM | POA: Diagnosis not present

## 2021-09-27 DIAGNOSIS — I890 Lymphedema, not elsewhere classified: Secondary | ICD-10-CM | POA: Diagnosis not present

## 2021-09-28 DIAGNOSIS — I890 Lymphedema, not elsewhere classified: Secondary | ICD-10-CM | POA: Diagnosis not present

## 2021-09-28 DIAGNOSIS — M8580 Other specified disorders of bone density and structure, unspecified site: Secondary | ICD-10-CM | POA: Diagnosis not present

## 2021-09-28 DIAGNOSIS — I5032 Chronic diastolic (congestive) heart failure: Secondary | ICD-10-CM | POA: Diagnosis not present

## 2021-09-28 DIAGNOSIS — G4730 Sleep apnea, unspecified: Secondary | ICD-10-CM | POA: Diagnosis not present

## 2021-09-28 DIAGNOSIS — H353130 Nonexudative age-related macular degeneration, bilateral, stage unspecified: Secondary | ICD-10-CM | POA: Diagnosis not present

## 2021-09-28 DIAGNOSIS — K5190 Ulcerative colitis, unspecified, without complications: Secondary | ICD-10-CM | POA: Diagnosis not present

## 2021-09-28 DIAGNOSIS — Z9181 History of falling: Secondary | ICD-10-CM | POA: Diagnosis not present

## 2021-09-28 DIAGNOSIS — I712 Thoracic aortic aneurysm, without rupture, unspecified: Secondary | ICD-10-CM | POA: Diagnosis not present

## 2021-09-28 DIAGNOSIS — A419 Sepsis, unspecified organism: Secondary | ICD-10-CM | POA: Diagnosis not present

## 2021-09-28 DIAGNOSIS — I2602 Saddle embolus of pulmonary artery with acute cor pulmonale: Secondary | ICD-10-CM | POA: Diagnosis not present

## 2021-09-28 DIAGNOSIS — E1142 Type 2 diabetes mellitus with diabetic polyneuropathy: Secondary | ICD-10-CM | POA: Diagnosis not present

## 2021-09-28 DIAGNOSIS — M1990 Unspecified osteoarthritis, unspecified site: Secondary | ICD-10-CM | POA: Diagnosis not present

## 2021-09-28 DIAGNOSIS — I110 Hypertensive heart disease with heart failure: Secondary | ICD-10-CM | POA: Diagnosis not present

## 2021-09-28 DIAGNOSIS — E785 Hyperlipidemia, unspecified: Secondary | ICD-10-CM | POA: Diagnosis not present

## 2021-09-28 DIAGNOSIS — Z7901 Long term (current) use of anticoagulants: Secondary | ICD-10-CM | POA: Diagnosis not present

## 2021-09-28 DIAGNOSIS — D6859 Other primary thrombophilia: Secondary | ICD-10-CM | POA: Diagnosis not present

## 2021-09-28 DIAGNOSIS — L03115 Cellulitis of right lower limb: Secondary | ICD-10-CM | POA: Diagnosis not present

## 2021-09-28 DIAGNOSIS — K219 Gastro-esophageal reflux disease without esophagitis: Secondary | ICD-10-CM | POA: Diagnosis not present

## 2021-09-28 DIAGNOSIS — E1165 Type 2 diabetes mellitus with hyperglycemia: Secondary | ICD-10-CM | POA: Diagnosis not present

## 2021-09-28 DIAGNOSIS — M519 Unspecified thoracic, thoracolumbar and lumbosacral intervertebral disc disorder: Secondary | ICD-10-CM | POA: Diagnosis not present

## 2021-09-28 DIAGNOSIS — I480 Paroxysmal atrial fibrillation: Secondary | ICD-10-CM | POA: Diagnosis not present

## 2021-09-28 DIAGNOSIS — D649 Anemia, unspecified: Secondary | ICD-10-CM | POA: Diagnosis not present

## 2021-09-28 DIAGNOSIS — Z7984 Long term (current) use of oral hypoglycemic drugs: Secondary | ICD-10-CM | POA: Diagnosis not present

## 2021-10-01 DIAGNOSIS — G4730 Sleep apnea, unspecified: Secondary | ICD-10-CM | POA: Diagnosis not present

## 2021-10-01 DIAGNOSIS — Z7901 Long term (current) use of anticoagulants: Secondary | ICD-10-CM | POA: Diagnosis not present

## 2021-10-01 DIAGNOSIS — M519 Unspecified thoracic, thoracolumbar and lumbosacral intervertebral disc disorder: Secondary | ICD-10-CM | POA: Diagnosis not present

## 2021-10-01 DIAGNOSIS — I890 Lymphedema, not elsewhere classified: Secondary | ICD-10-CM | POA: Diagnosis not present

## 2021-10-01 DIAGNOSIS — I5032 Chronic diastolic (congestive) heart failure: Secondary | ICD-10-CM | POA: Diagnosis not present

## 2021-10-01 DIAGNOSIS — A419 Sepsis, unspecified organism: Secondary | ICD-10-CM | POA: Diagnosis not present

## 2021-10-01 DIAGNOSIS — Z9181 History of falling: Secondary | ICD-10-CM | POA: Diagnosis not present

## 2021-10-01 DIAGNOSIS — K219 Gastro-esophageal reflux disease without esophagitis: Secondary | ICD-10-CM | POA: Diagnosis not present

## 2021-10-01 DIAGNOSIS — D6859 Other primary thrombophilia: Secondary | ICD-10-CM | POA: Diagnosis not present

## 2021-10-01 DIAGNOSIS — M1990 Unspecified osteoarthritis, unspecified site: Secondary | ICD-10-CM | POA: Diagnosis not present

## 2021-10-01 DIAGNOSIS — E785 Hyperlipidemia, unspecified: Secondary | ICD-10-CM | POA: Diagnosis not present

## 2021-10-01 DIAGNOSIS — I110 Hypertensive heart disease with heart failure: Secondary | ICD-10-CM | POA: Diagnosis not present

## 2021-10-01 DIAGNOSIS — E1142 Type 2 diabetes mellitus with diabetic polyneuropathy: Secondary | ICD-10-CM | POA: Diagnosis not present

## 2021-10-01 DIAGNOSIS — I480 Paroxysmal atrial fibrillation: Secondary | ICD-10-CM | POA: Diagnosis not present

## 2021-10-01 DIAGNOSIS — L03115 Cellulitis of right lower limb: Secondary | ICD-10-CM | POA: Diagnosis not present

## 2021-10-01 DIAGNOSIS — M8580 Other specified disorders of bone density and structure, unspecified site: Secondary | ICD-10-CM | POA: Diagnosis not present

## 2021-10-01 DIAGNOSIS — I2602 Saddle embolus of pulmonary artery with acute cor pulmonale: Secondary | ICD-10-CM | POA: Diagnosis not present

## 2021-10-01 DIAGNOSIS — K5190 Ulcerative colitis, unspecified, without complications: Secondary | ICD-10-CM | POA: Diagnosis not present

## 2021-10-01 DIAGNOSIS — E1165 Type 2 diabetes mellitus with hyperglycemia: Secondary | ICD-10-CM | POA: Diagnosis not present

## 2021-10-01 DIAGNOSIS — H353130 Nonexudative age-related macular degeneration, bilateral, stage unspecified: Secondary | ICD-10-CM | POA: Diagnosis not present

## 2021-10-01 DIAGNOSIS — D649 Anemia, unspecified: Secondary | ICD-10-CM | POA: Diagnosis not present

## 2021-10-01 DIAGNOSIS — Z7984 Long term (current) use of oral hypoglycemic drugs: Secondary | ICD-10-CM | POA: Diagnosis not present

## 2021-10-01 DIAGNOSIS — I712 Thoracic aortic aneurysm, without rupture, unspecified: Secondary | ICD-10-CM | POA: Diagnosis not present

## 2021-10-04 DIAGNOSIS — K5190 Ulcerative colitis, unspecified, without complications: Secondary | ICD-10-CM | POA: Diagnosis not present

## 2021-10-04 DIAGNOSIS — I5032 Chronic diastolic (congestive) heart failure: Secondary | ICD-10-CM | POA: Diagnosis not present

## 2021-10-04 DIAGNOSIS — D649 Anemia, unspecified: Secondary | ICD-10-CM | POA: Diagnosis not present

## 2021-10-04 DIAGNOSIS — I890 Lymphedema, not elsewhere classified: Secondary | ICD-10-CM | POA: Diagnosis not present

## 2021-10-04 DIAGNOSIS — K219 Gastro-esophageal reflux disease without esophagitis: Secondary | ICD-10-CM | POA: Diagnosis not present

## 2021-10-04 DIAGNOSIS — M8580 Other specified disorders of bone density and structure, unspecified site: Secondary | ICD-10-CM | POA: Diagnosis not present

## 2021-10-04 DIAGNOSIS — I480 Paroxysmal atrial fibrillation: Secondary | ICD-10-CM | POA: Diagnosis not present

## 2021-10-04 DIAGNOSIS — E785 Hyperlipidemia, unspecified: Secondary | ICD-10-CM | POA: Diagnosis not present

## 2021-10-04 DIAGNOSIS — M1990 Unspecified osteoarthritis, unspecified site: Secondary | ICD-10-CM | POA: Diagnosis not present

## 2021-10-04 DIAGNOSIS — E1165 Type 2 diabetes mellitus with hyperglycemia: Secondary | ICD-10-CM | POA: Diagnosis not present

## 2021-10-04 DIAGNOSIS — I2602 Saddle embolus of pulmonary artery with acute cor pulmonale: Secondary | ICD-10-CM | POA: Diagnosis not present

## 2021-10-04 DIAGNOSIS — L03115 Cellulitis of right lower limb: Secondary | ICD-10-CM | POA: Diagnosis not present

## 2021-10-04 DIAGNOSIS — A419 Sepsis, unspecified organism: Secondary | ICD-10-CM | POA: Diagnosis not present

## 2021-10-04 DIAGNOSIS — Z9181 History of falling: Secondary | ICD-10-CM | POA: Diagnosis not present

## 2021-10-04 DIAGNOSIS — G4730 Sleep apnea, unspecified: Secondary | ICD-10-CM | POA: Diagnosis not present

## 2021-10-04 DIAGNOSIS — E1142 Type 2 diabetes mellitus with diabetic polyneuropathy: Secondary | ICD-10-CM | POA: Diagnosis not present

## 2021-10-04 DIAGNOSIS — H353130 Nonexudative age-related macular degeneration, bilateral, stage unspecified: Secondary | ICD-10-CM | POA: Diagnosis not present

## 2021-10-04 DIAGNOSIS — Z7901 Long term (current) use of anticoagulants: Secondary | ICD-10-CM | POA: Diagnosis not present

## 2021-10-04 DIAGNOSIS — D6859 Other primary thrombophilia: Secondary | ICD-10-CM | POA: Diagnosis not present

## 2021-10-04 DIAGNOSIS — Z7984 Long term (current) use of oral hypoglycemic drugs: Secondary | ICD-10-CM | POA: Diagnosis not present

## 2021-10-04 DIAGNOSIS — M519 Unspecified thoracic, thoracolumbar and lumbosacral intervertebral disc disorder: Secondary | ICD-10-CM | POA: Diagnosis not present

## 2021-10-04 DIAGNOSIS — I110 Hypertensive heart disease with heart failure: Secondary | ICD-10-CM | POA: Diagnosis not present

## 2021-10-04 DIAGNOSIS — I712 Thoracic aortic aneurysm, without rupture, unspecified: Secondary | ICD-10-CM | POA: Diagnosis not present

## 2021-10-05 DIAGNOSIS — E1165 Type 2 diabetes mellitus with hyperglycemia: Secondary | ICD-10-CM | POA: Diagnosis not present

## 2021-10-05 DIAGNOSIS — Z7901 Long term (current) use of anticoagulants: Secondary | ICD-10-CM | POA: Diagnosis not present

## 2021-10-05 DIAGNOSIS — I2602 Saddle embolus of pulmonary artery with acute cor pulmonale: Secondary | ICD-10-CM | POA: Diagnosis not present

## 2021-10-05 DIAGNOSIS — A419 Sepsis, unspecified organism: Secondary | ICD-10-CM | POA: Diagnosis not present

## 2021-10-05 DIAGNOSIS — G4730 Sleep apnea, unspecified: Secondary | ICD-10-CM | POA: Diagnosis not present

## 2021-10-05 DIAGNOSIS — I890 Lymphedema, not elsewhere classified: Secondary | ICD-10-CM | POA: Diagnosis not present

## 2021-10-05 DIAGNOSIS — Z7984 Long term (current) use of oral hypoglycemic drugs: Secondary | ICD-10-CM | POA: Diagnosis not present

## 2021-10-05 DIAGNOSIS — M1990 Unspecified osteoarthritis, unspecified site: Secondary | ICD-10-CM | POA: Diagnosis not present

## 2021-10-05 DIAGNOSIS — I480 Paroxysmal atrial fibrillation: Secondary | ICD-10-CM | POA: Diagnosis not present

## 2021-10-05 DIAGNOSIS — Z9181 History of falling: Secondary | ICD-10-CM | POA: Diagnosis not present

## 2021-10-05 DIAGNOSIS — M519 Unspecified thoracic, thoracolumbar and lumbosacral intervertebral disc disorder: Secondary | ICD-10-CM | POA: Diagnosis not present

## 2021-10-05 DIAGNOSIS — M8580 Other specified disorders of bone density and structure, unspecified site: Secondary | ICD-10-CM | POA: Diagnosis not present

## 2021-10-05 DIAGNOSIS — D6859 Other primary thrombophilia: Secondary | ICD-10-CM | POA: Diagnosis not present

## 2021-10-05 DIAGNOSIS — E785 Hyperlipidemia, unspecified: Secondary | ICD-10-CM | POA: Diagnosis not present

## 2021-10-05 DIAGNOSIS — D649 Anemia, unspecified: Secondary | ICD-10-CM | POA: Diagnosis not present

## 2021-10-05 DIAGNOSIS — E1142 Type 2 diabetes mellitus with diabetic polyneuropathy: Secondary | ICD-10-CM | POA: Diagnosis not present

## 2021-10-05 DIAGNOSIS — H353130 Nonexudative age-related macular degeneration, bilateral, stage unspecified: Secondary | ICD-10-CM | POA: Diagnosis not present

## 2021-10-05 DIAGNOSIS — L03115 Cellulitis of right lower limb: Secondary | ICD-10-CM | POA: Diagnosis not present

## 2021-10-05 DIAGNOSIS — I110 Hypertensive heart disease with heart failure: Secondary | ICD-10-CM | POA: Diagnosis not present

## 2021-10-05 DIAGNOSIS — K5190 Ulcerative colitis, unspecified, without complications: Secondary | ICD-10-CM | POA: Diagnosis not present

## 2021-10-05 DIAGNOSIS — K219 Gastro-esophageal reflux disease without esophagitis: Secondary | ICD-10-CM | POA: Diagnosis not present

## 2021-10-05 DIAGNOSIS — I712 Thoracic aortic aneurysm, without rupture, unspecified: Secondary | ICD-10-CM | POA: Diagnosis not present

## 2021-10-05 DIAGNOSIS — I5032 Chronic diastolic (congestive) heart failure: Secondary | ICD-10-CM | POA: Diagnosis not present

## 2021-10-09 DIAGNOSIS — Z7901 Long term (current) use of anticoagulants: Secondary | ICD-10-CM | POA: Diagnosis not present

## 2021-10-09 DIAGNOSIS — K219 Gastro-esophageal reflux disease without esophagitis: Secondary | ICD-10-CM | POA: Diagnosis not present

## 2021-10-09 DIAGNOSIS — I480 Paroxysmal atrial fibrillation: Secondary | ICD-10-CM | POA: Diagnosis not present

## 2021-10-09 DIAGNOSIS — M519 Unspecified thoracic, thoracolumbar and lumbosacral intervertebral disc disorder: Secondary | ICD-10-CM | POA: Diagnosis not present

## 2021-10-09 DIAGNOSIS — E785 Hyperlipidemia, unspecified: Secondary | ICD-10-CM | POA: Diagnosis not present

## 2021-10-09 DIAGNOSIS — G4730 Sleep apnea, unspecified: Secondary | ICD-10-CM | POA: Diagnosis not present

## 2021-10-09 DIAGNOSIS — Z7984 Long term (current) use of oral hypoglycemic drugs: Secondary | ICD-10-CM | POA: Diagnosis not present

## 2021-10-09 DIAGNOSIS — M1990 Unspecified osteoarthritis, unspecified site: Secondary | ICD-10-CM | POA: Diagnosis not present

## 2021-10-09 DIAGNOSIS — K5190 Ulcerative colitis, unspecified, without complications: Secondary | ICD-10-CM | POA: Diagnosis not present

## 2021-10-09 DIAGNOSIS — I5032 Chronic diastolic (congestive) heart failure: Secondary | ICD-10-CM | POA: Diagnosis not present

## 2021-10-09 DIAGNOSIS — I712 Thoracic aortic aneurysm, without rupture, unspecified: Secondary | ICD-10-CM | POA: Diagnosis not present

## 2021-10-09 DIAGNOSIS — I2602 Saddle embolus of pulmonary artery with acute cor pulmonale: Secondary | ICD-10-CM | POA: Diagnosis not present

## 2021-10-09 DIAGNOSIS — D649 Anemia, unspecified: Secondary | ICD-10-CM | POA: Diagnosis not present

## 2021-10-09 DIAGNOSIS — L03115 Cellulitis of right lower limb: Secondary | ICD-10-CM | POA: Diagnosis not present

## 2021-10-09 DIAGNOSIS — H353130 Nonexudative age-related macular degeneration, bilateral, stage unspecified: Secondary | ICD-10-CM | POA: Diagnosis not present

## 2021-10-09 DIAGNOSIS — I110 Hypertensive heart disease with heart failure: Secondary | ICD-10-CM | POA: Diagnosis not present

## 2021-10-09 DIAGNOSIS — D6859 Other primary thrombophilia: Secondary | ICD-10-CM | POA: Diagnosis not present

## 2021-10-09 DIAGNOSIS — M8580 Other specified disorders of bone density and structure, unspecified site: Secondary | ICD-10-CM | POA: Diagnosis not present

## 2021-10-09 DIAGNOSIS — I890 Lymphedema, not elsewhere classified: Secondary | ICD-10-CM | POA: Diagnosis not present

## 2021-10-09 DIAGNOSIS — E1142 Type 2 diabetes mellitus with diabetic polyneuropathy: Secondary | ICD-10-CM | POA: Diagnosis not present

## 2021-10-09 DIAGNOSIS — Z9181 History of falling: Secondary | ICD-10-CM | POA: Diagnosis not present

## 2021-10-09 DIAGNOSIS — A419 Sepsis, unspecified organism: Secondary | ICD-10-CM | POA: Diagnosis not present

## 2021-10-09 DIAGNOSIS — E1165 Type 2 diabetes mellitus with hyperglycemia: Secondary | ICD-10-CM | POA: Diagnosis not present

## 2021-10-10 DIAGNOSIS — E785 Hyperlipidemia, unspecified: Secondary | ICD-10-CM | POA: Diagnosis not present

## 2021-10-10 DIAGNOSIS — M1990 Unspecified osteoarthritis, unspecified site: Secondary | ICD-10-CM | POA: Diagnosis not present

## 2021-10-10 DIAGNOSIS — I890 Lymphedema, not elsewhere classified: Secondary | ICD-10-CM | POA: Diagnosis not present

## 2021-10-10 DIAGNOSIS — I712 Thoracic aortic aneurysm, without rupture, unspecified: Secondary | ICD-10-CM | POA: Diagnosis not present

## 2021-10-10 DIAGNOSIS — D649 Anemia, unspecified: Secondary | ICD-10-CM | POA: Diagnosis not present

## 2021-10-10 DIAGNOSIS — Z7901 Long term (current) use of anticoagulants: Secondary | ICD-10-CM | POA: Diagnosis not present

## 2021-10-10 DIAGNOSIS — L03115 Cellulitis of right lower limb: Secondary | ICD-10-CM | POA: Diagnosis not present

## 2021-10-10 DIAGNOSIS — M519 Unspecified thoracic, thoracolumbar and lumbosacral intervertebral disc disorder: Secondary | ICD-10-CM | POA: Diagnosis not present

## 2021-10-10 DIAGNOSIS — M8580 Other specified disorders of bone density and structure, unspecified site: Secondary | ICD-10-CM | POA: Diagnosis not present

## 2021-10-10 DIAGNOSIS — Z7984 Long term (current) use of oral hypoglycemic drugs: Secondary | ICD-10-CM | POA: Diagnosis not present

## 2021-10-10 DIAGNOSIS — K219 Gastro-esophageal reflux disease without esophagitis: Secondary | ICD-10-CM | POA: Diagnosis not present

## 2021-10-10 DIAGNOSIS — I5032 Chronic diastolic (congestive) heart failure: Secondary | ICD-10-CM | POA: Diagnosis not present

## 2021-10-10 DIAGNOSIS — H353130 Nonexudative age-related macular degeneration, bilateral, stage unspecified: Secondary | ICD-10-CM | POA: Diagnosis not present

## 2021-10-10 DIAGNOSIS — Z9181 History of falling: Secondary | ICD-10-CM | POA: Diagnosis not present

## 2021-10-10 DIAGNOSIS — I2602 Saddle embolus of pulmonary artery with acute cor pulmonale: Secondary | ICD-10-CM | POA: Diagnosis not present

## 2021-10-10 DIAGNOSIS — G4730 Sleep apnea, unspecified: Secondary | ICD-10-CM | POA: Diagnosis not present

## 2021-10-10 DIAGNOSIS — I480 Paroxysmal atrial fibrillation: Secondary | ICD-10-CM | POA: Diagnosis not present

## 2021-10-10 DIAGNOSIS — K5190 Ulcerative colitis, unspecified, without complications: Secondary | ICD-10-CM | POA: Diagnosis not present

## 2021-10-10 DIAGNOSIS — I110 Hypertensive heart disease with heart failure: Secondary | ICD-10-CM | POA: Diagnosis not present

## 2021-10-10 DIAGNOSIS — D6859 Other primary thrombophilia: Secondary | ICD-10-CM | POA: Diagnosis not present

## 2021-10-10 DIAGNOSIS — A419 Sepsis, unspecified organism: Secondary | ICD-10-CM | POA: Diagnosis not present

## 2021-10-10 DIAGNOSIS — E1142 Type 2 diabetes mellitus with diabetic polyneuropathy: Secondary | ICD-10-CM | POA: Diagnosis not present

## 2021-10-10 DIAGNOSIS — E1165 Type 2 diabetes mellitus with hyperglycemia: Secondary | ICD-10-CM | POA: Diagnosis not present

## 2021-10-11 DIAGNOSIS — K219 Gastro-esophageal reflux disease without esophagitis: Secondary | ICD-10-CM | POA: Diagnosis not present

## 2021-10-11 DIAGNOSIS — Z7984 Long term (current) use of oral hypoglycemic drugs: Secondary | ICD-10-CM | POA: Diagnosis not present

## 2021-10-11 DIAGNOSIS — D6859 Other primary thrombophilia: Secondary | ICD-10-CM | POA: Diagnosis not present

## 2021-10-11 DIAGNOSIS — A419 Sepsis, unspecified organism: Secondary | ICD-10-CM | POA: Diagnosis not present

## 2021-10-11 DIAGNOSIS — I890 Lymphedema, not elsewhere classified: Secondary | ICD-10-CM | POA: Diagnosis not present

## 2021-10-11 DIAGNOSIS — M1990 Unspecified osteoarthritis, unspecified site: Secondary | ICD-10-CM | POA: Diagnosis not present

## 2021-10-11 DIAGNOSIS — M519 Unspecified thoracic, thoracolumbar and lumbosacral intervertebral disc disorder: Secondary | ICD-10-CM | POA: Diagnosis not present

## 2021-10-11 DIAGNOSIS — M8580 Other specified disorders of bone density and structure, unspecified site: Secondary | ICD-10-CM | POA: Diagnosis not present

## 2021-10-11 DIAGNOSIS — I110 Hypertensive heart disease with heart failure: Secondary | ICD-10-CM | POA: Diagnosis not present

## 2021-10-11 DIAGNOSIS — D649 Anemia, unspecified: Secondary | ICD-10-CM | POA: Diagnosis not present

## 2021-10-11 DIAGNOSIS — L03115 Cellulitis of right lower limb: Secondary | ICD-10-CM | POA: Diagnosis not present

## 2021-10-11 DIAGNOSIS — Z9181 History of falling: Secondary | ICD-10-CM | POA: Diagnosis not present

## 2021-10-11 DIAGNOSIS — E1165 Type 2 diabetes mellitus with hyperglycemia: Secondary | ICD-10-CM | POA: Diagnosis not present

## 2021-10-11 DIAGNOSIS — Z7901 Long term (current) use of anticoagulants: Secondary | ICD-10-CM | POA: Diagnosis not present

## 2021-10-11 DIAGNOSIS — I2602 Saddle embolus of pulmonary artery with acute cor pulmonale: Secondary | ICD-10-CM | POA: Diagnosis not present

## 2021-10-11 DIAGNOSIS — I480 Paroxysmal atrial fibrillation: Secondary | ICD-10-CM | POA: Diagnosis not present

## 2021-10-11 DIAGNOSIS — G4730 Sleep apnea, unspecified: Secondary | ICD-10-CM | POA: Diagnosis not present

## 2021-10-11 DIAGNOSIS — E785 Hyperlipidemia, unspecified: Secondary | ICD-10-CM | POA: Diagnosis not present

## 2021-10-11 DIAGNOSIS — I5032 Chronic diastolic (congestive) heart failure: Secondary | ICD-10-CM | POA: Diagnosis not present

## 2021-10-11 DIAGNOSIS — I712 Thoracic aortic aneurysm, without rupture, unspecified: Secondary | ICD-10-CM | POA: Diagnosis not present

## 2021-10-11 DIAGNOSIS — E1142 Type 2 diabetes mellitus with diabetic polyneuropathy: Secondary | ICD-10-CM | POA: Diagnosis not present

## 2021-10-11 DIAGNOSIS — K5190 Ulcerative colitis, unspecified, without complications: Secondary | ICD-10-CM | POA: Diagnosis not present

## 2021-10-11 DIAGNOSIS — H353130 Nonexudative age-related macular degeneration, bilateral, stage unspecified: Secondary | ICD-10-CM | POA: Diagnosis not present

## 2021-10-12 DIAGNOSIS — E782 Mixed hyperlipidemia: Secondary | ICD-10-CM | POA: Diagnosis not present

## 2021-10-12 DIAGNOSIS — I4891 Unspecified atrial fibrillation: Secondary | ICD-10-CM | POA: Diagnosis not present

## 2021-10-12 DIAGNOSIS — E1165 Type 2 diabetes mellitus with hyperglycemia: Secondary | ICD-10-CM | POA: Diagnosis not present

## 2021-10-12 DIAGNOSIS — I10 Essential (primary) hypertension: Secondary | ICD-10-CM | POA: Diagnosis not present

## 2021-10-12 DIAGNOSIS — M810 Age-related osteoporosis without current pathological fracture: Secondary | ICD-10-CM | POA: Diagnosis not present

## 2021-10-12 DIAGNOSIS — E11319 Type 2 diabetes mellitus with unspecified diabetic retinopathy without macular edema: Secondary | ICD-10-CM | POA: Diagnosis not present

## 2021-10-12 DIAGNOSIS — E0840 Diabetes mellitus due to underlying condition with diabetic neuropathy, unspecified: Secondary | ICD-10-CM | POA: Diagnosis not present

## 2021-10-12 DIAGNOSIS — G43009 Migraine without aura, not intractable, without status migrainosus: Secondary | ICD-10-CM | POA: Diagnosis not present

## 2021-10-16 DIAGNOSIS — G4730 Sleep apnea, unspecified: Secondary | ICD-10-CM | POA: Diagnosis not present

## 2021-10-16 DIAGNOSIS — M1990 Unspecified osteoarthritis, unspecified site: Secondary | ICD-10-CM | POA: Diagnosis not present

## 2021-10-16 DIAGNOSIS — D6859 Other primary thrombophilia: Secondary | ICD-10-CM | POA: Diagnosis not present

## 2021-10-16 DIAGNOSIS — I890 Lymphedema, not elsewhere classified: Secondary | ICD-10-CM | POA: Diagnosis not present

## 2021-10-16 DIAGNOSIS — M519 Unspecified thoracic, thoracolumbar and lumbosacral intervertebral disc disorder: Secondary | ICD-10-CM | POA: Diagnosis not present

## 2021-10-16 DIAGNOSIS — K5190 Ulcerative colitis, unspecified, without complications: Secondary | ICD-10-CM | POA: Diagnosis not present

## 2021-10-16 DIAGNOSIS — Z7901 Long term (current) use of anticoagulants: Secondary | ICD-10-CM | POA: Diagnosis not present

## 2021-10-16 DIAGNOSIS — L03115 Cellulitis of right lower limb: Secondary | ICD-10-CM | POA: Diagnosis not present

## 2021-10-16 DIAGNOSIS — E1142 Type 2 diabetes mellitus with diabetic polyneuropathy: Secondary | ICD-10-CM | POA: Diagnosis not present

## 2021-10-16 DIAGNOSIS — Z7984 Long term (current) use of oral hypoglycemic drugs: Secondary | ICD-10-CM | POA: Diagnosis not present

## 2021-10-16 DIAGNOSIS — H353130 Nonexudative age-related macular degeneration, bilateral, stage unspecified: Secondary | ICD-10-CM | POA: Diagnosis not present

## 2021-10-16 DIAGNOSIS — K219 Gastro-esophageal reflux disease without esophagitis: Secondary | ICD-10-CM | POA: Diagnosis not present

## 2021-10-16 DIAGNOSIS — M8580 Other specified disorders of bone density and structure, unspecified site: Secondary | ICD-10-CM | POA: Diagnosis not present

## 2021-10-16 DIAGNOSIS — I110 Hypertensive heart disease with heart failure: Secondary | ICD-10-CM | POA: Diagnosis not present

## 2021-10-16 DIAGNOSIS — Z9181 History of falling: Secondary | ICD-10-CM | POA: Diagnosis not present

## 2021-10-16 DIAGNOSIS — I5032 Chronic diastolic (congestive) heart failure: Secondary | ICD-10-CM | POA: Diagnosis not present

## 2021-10-16 DIAGNOSIS — E785 Hyperlipidemia, unspecified: Secondary | ICD-10-CM | POA: Diagnosis not present

## 2021-10-16 DIAGNOSIS — E1165 Type 2 diabetes mellitus with hyperglycemia: Secondary | ICD-10-CM | POA: Diagnosis not present

## 2021-10-16 DIAGNOSIS — D649 Anemia, unspecified: Secondary | ICD-10-CM | POA: Diagnosis not present

## 2021-10-16 DIAGNOSIS — A419 Sepsis, unspecified organism: Secondary | ICD-10-CM | POA: Diagnosis not present

## 2021-10-16 DIAGNOSIS — I712 Thoracic aortic aneurysm, without rupture, unspecified: Secondary | ICD-10-CM | POA: Diagnosis not present

## 2021-10-16 DIAGNOSIS — I480 Paroxysmal atrial fibrillation: Secondary | ICD-10-CM | POA: Diagnosis not present

## 2021-10-16 DIAGNOSIS — I2602 Saddle embolus of pulmonary artery with acute cor pulmonale: Secondary | ICD-10-CM | POA: Diagnosis not present

## 2021-10-18 DIAGNOSIS — D649 Anemia, unspecified: Secondary | ICD-10-CM | POA: Diagnosis not present

## 2021-10-18 DIAGNOSIS — M8580 Other specified disorders of bone density and structure, unspecified site: Secondary | ICD-10-CM | POA: Diagnosis not present

## 2021-10-18 DIAGNOSIS — Z7901 Long term (current) use of anticoagulants: Secondary | ICD-10-CM | POA: Diagnosis not present

## 2021-10-18 DIAGNOSIS — H353130 Nonexudative age-related macular degeneration, bilateral, stage unspecified: Secondary | ICD-10-CM | POA: Diagnosis not present

## 2021-10-18 DIAGNOSIS — I712 Thoracic aortic aneurysm, without rupture, unspecified: Secondary | ICD-10-CM | POA: Diagnosis not present

## 2021-10-18 DIAGNOSIS — I110 Hypertensive heart disease with heart failure: Secondary | ICD-10-CM | POA: Diagnosis not present

## 2021-10-18 DIAGNOSIS — M1990 Unspecified osteoarthritis, unspecified site: Secondary | ICD-10-CM | POA: Diagnosis not present

## 2021-10-18 DIAGNOSIS — Z7984 Long term (current) use of oral hypoglycemic drugs: Secondary | ICD-10-CM | POA: Diagnosis not present

## 2021-10-18 DIAGNOSIS — D6859 Other primary thrombophilia: Secondary | ICD-10-CM | POA: Diagnosis not present

## 2021-10-18 DIAGNOSIS — E1142 Type 2 diabetes mellitus with diabetic polyneuropathy: Secondary | ICD-10-CM | POA: Diagnosis not present

## 2021-10-18 DIAGNOSIS — M519 Unspecified thoracic, thoracolumbar and lumbosacral intervertebral disc disorder: Secondary | ICD-10-CM | POA: Diagnosis not present

## 2021-10-18 DIAGNOSIS — I2602 Saddle embolus of pulmonary artery with acute cor pulmonale: Secondary | ICD-10-CM | POA: Diagnosis not present

## 2021-10-18 DIAGNOSIS — Z9181 History of falling: Secondary | ICD-10-CM | POA: Diagnosis not present

## 2021-10-18 DIAGNOSIS — I480 Paroxysmal atrial fibrillation: Secondary | ICD-10-CM | POA: Diagnosis not present

## 2021-10-18 DIAGNOSIS — K5190 Ulcerative colitis, unspecified, without complications: Secondary | ICD-10-CM | POA: Diagnosis not present

## 2021-10-18 DIAGNOSIS — L03115 Cellulitis of right lower limb: Secondary | ICD-10-CM | POA: Diagnosis not present

## 2021-10-18 DIAGNOSIS — I5032 Chronic diastolic (congestive) heart failure: Secondary | ICD-10-CM | POA: Diagnosis not present

## 2021-10-18 DIAGNOSIS — I890 Lymphedema, not elsewhere classified: Secondary | ICD-10-CM | POA: Diagnosis not present

## 2021-10-18 DIAGNOSIS — E785 Hyperlipidemia, unspecified: Secondary | ICD-10-CM | POA: Diagnosis not present

## 2021-10-18 DIAGNOSIS — G4730 Sleep apnea, unspecified: Secondary | ICD-10-CM | POA: Diagnosis not present

## 2021-10-18 DIAGNOSIS — K219 Gastro-esophageal reflux disease without esophagitis: Secondary | ICD-10-CM | POA: Diagnosis not present

## 2021-10-18 DIAGNOSIS — E1165 Type 2 diabetes mellitus with hyperglycemia: Secondary | ICD-10-CM | POA: Diagnosis not present

## 2021-10-18 DIAGNOSIS — A419 Sepsis, unspecified organism: Secondary | ICD-10-CM | POA: Diagnosis not present

## 2021-10-30 ENCOUNTER — Encounter (INDEPENDENT_AMBULATORY_CARE_PROVIDER_SITE_OTHER): Payer: Medicare Other | Admitting: Ophthalmology

## 2021-10-30 ENCOUNTER — Other Ambulatory Visit: Payer: Self-pay

## 2021-10-30 DIAGNOSIS — H35033 Hypertensive retinopathy, bilateral: Secondary | ICD-10-CM | POA: Diagnosis not present

## 2021-10-30 DIAGNOSIS — H43813 Vitreous degeneration, bilateral: Secondary | ICD-10-CM

## 2021-10-30 DIAGNOSIS — H353221 Exudative age-related macular degeneration, left eye, with active choroidal neovascularization: Secondary | ICD-10-CM

## 2021-10-30 DIAGNOSIS — H353112 Nonexudative age-related macular degeneration, right eye, intermediate dry stage: Secondary | ICD-10-CM

## 2021-10-30 DIAGNOSIS — I10 Essential (primary) hypertension: Secondary | ICD-10-CM | POA: Diagnosis not present

## 2021-10-30 DIAGNOSIS — Z7901 Long term (current) use of anticoagulants: Secondary | ICD-10-CM | POA: Diagnosis not present

## 2021-11-13 ENCOUNTER — Ambulatory Visit (HOSPITAL_COMMUNITY)
Admission: RE | Admit: 2021-11-13 | Discharge: 2021-11-13 | Disposition: A | Discharge status: Home / Self Care | Payer: Medicare Other | Source: Ambulatory Visit | Attending: Nurse Practitioner | Admitting: Nurse Practitioner

## 2021-11-13 ENCOUNTER — Encounter (HOSPITAL_COMMUNITY): Payer: Self-pay | Admitting: Nurse Practitioner

## 2021-11-13 ENCOUNTER — Other Ambulatory Visit: Payer: Self-pay

## 2021-11-13 VITALS — BP 140/74 | HR 44 | Ht 63.0 in | Wt 283.8 lb

## 2021-11-13 DIAGNOSIS — Z87440 Personal history of urinary (tract) infections: Secondary | ICD-10-CM | POA: Diagnosis not present

## 2021-11-13 DIAGNOSIS — E119 Type 2 diabetes mellitus without complications: Secondary | ICD-10-CM | POA: Insufficient documentation

## 2021-11-13 DIAGNOSIS — I7121 Aneurysm of the ascending aorta, without rupture: Secondary | ICD-10-CM | POA: Insufficient documentation

## 2021-11-13 DIAGNOSIS — I440 Atrioventricular block, first degree: Secondary | ICD-10-CM | POA: Insufficient documentation

## 2021-11-13 DIAGNOSIS — I10 Essential (primary) hypertension: Secondary | ICD-10-CM | POA: Insufficient documentation

## 2021-11-13 DIAGNOSIS — Z86718 Personal history of other venous thrombosis and embolism: Secondary | ICD-10-CM | POA: Diagnosis not present

## 2021-11-13 DIAGNOSIS — Z6841 Body Mass Index (BMI) 40.0 and over, adult: Secondary | ICD-10-CM | POA: Diagnosis not present

## 2021-11-13 DIAGNOSIS — R001 Bradycardia, unspecified: Secondary | ICD-10-CM | POA: Diagnosis not present

## 2021-11-13 DIAGNOSIS — M1990 Unspecified osteoarthritis, unspecified site: Secondary | ICD-10-CM | POA: Insufficient documentation

## 2021-11-13 DIAGNOSIS — R600 Localized edema: Secondary | ICD-10-CM | POA: Diagnosis not present

## 2021-11-13 DIAGNOSIS — G4733 Obstructive sleep apnea (adult) (pediatric): Secondary | ICD-10-CM | POA: Diagnosis not present

## 2021-11-13 DIAGNOSIS — D649 Anemia, unspecified: Secondary | ICD-10-CM | POA: Diagnosis not present

## 2021-11-13 DIAGNOSIS — I4819 Other persistent atrial fibrillation: Secondary | ICD-10-CM | POA: Diagnosis not present

## 2021-11-13 DIAGNOSIS — Z7901 Long term (current) use of anticoagulants: Secondary | ICD-10-CM | POA: Insufficient documentation

## 2021-11-13 DIAGNOSIS — I480 Paroxysmal atrial fibrillation: Secondary | ICD-10-CM | POA: Diagnosis not present

## 2021-11-13 DIAGNOSIS — E669 Obesity, unspecified: Secondary | ICD-10-CM | POA: Diagnosis not present

## 2021-11-13 DIAGNOSIS — K219 Gastro-esophageal reflux disease without esophagitis: Secondary | ICD-10-CM | POA: Diagnosis not present

## 2021-11-13 DIAGNOSIS — D6869 Other thrombophilia: Secondary | ICD-10-CM | POA: Diagnosis not present

## 2021-11-13 DIAGNOSIS — Z79899 Other long term (current) drug therapy: Secondary | ICD-10-CM | POA: Insufficient documentation

## 2021-11-13 DIAGNOSIS — Z8249 Family history of ischemic heart disease and other diseases of the circulatory system: Secondary | ICD-10-CM | POA: Insufficient documentation

## 2021-11-13 DIAGNOSIS — Z9989 Dependence on other enabling machines and devices: Secondary | ICD-10-CM | POA: Insufficient documentation

## 2021-11-13 LAB — BASIC METABOLIC PANEL
Anion gap: 8 (ref 5–15)
BUN: 28 mg/dL — ABNORMAL HIGH (ref 8–23)
CO2: 28 mmol/L (ref 22–32)
Calcium: 9.7 mg/dL (ref 8.9–10.3)
Chloride: 103 mmol/L (ref 98–111)
Creatinine, Ser: 1.36 mg/dL — ABNORMAL HIGH (ref 0.44–1.00)
GFR, Estimated: 39 mL/min — ABNORMAL LOW (ref >60)
Glucose, Bld: 122 mg/dL — ABNORMAL HIGH (ref 70–99)
Potassium: 5.2 mmol/L — ABNORMAL HIGH (ref 3.5–5.1)
Sodium: 139 mmol/L (ref 135–145)

## 2021-11-13 LAB — MAGNESIUM: Magnesium: 1.9 mg/dL (ref 1.7–2.4)

## 2021-11-13 MED ORDER — METOPROLOL TARTRATE 100 MG PO TABS: 50.0000 mg | ORAL_TABLET | Freq: Two times a day (BID) | ORAL | 0 refills | Status: DC

## 2021-11-13 MED ORDER — FUROSEMIDE 40 MG PO TABS: ORAL_TABLET | ORAL | Status: DC
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
## 2021-11-13 NOTE — Patient Instructions (Signed)
Change metoprolol to 2m twice a day

## 2021-11-14 ENCOUNTER — Other Ambulatory Visit (HOSPITAL_COMMUNITY): Payer: Self-pay | Admitting: *Deleted

## 2021-11-14 DIAGNOSIS — I480 Paroxysmal atrial fibrillation: Secondary | ICD-10-CM

## 2021-11-27 ENCOUNTER — Encounter (INDEPENDENT_AMBULATORY_CARE_PROVIDER_SITE_OTHER): Payer: Medicare Other | Admitting: Ophthalmology

## 2021-11-27 ENCOUNTER — Other Ambulatory Visit: Payer: Self-pay

## 2021-11-27 DIAGNOSIS — I10 Essential (primary) hypertension: Secondary | ICD-10-CM

## 2021-11-27 DIAGNOSIS — H353112 Nonexudative age-related macular degeneration, right eye, intermediate dry stage: Secondary | ICD-10-CM

## 2021-11-27 DIAGNOSIS — H43813 Vitreous degeneration, bilateral: Secondary | ICD-10-CM | POA: Diagnosis not present

## 2021-11-27 DIAGNOSIS — H35033 Hypertensive retinopathy, bilateral: Secondary | ICD-10-CM

## 2021-11-27 DIAGNOSIS — H353221 Exudative age-related macular degeneration, left eye, with active choroidal neovascularization: Secondary | ICD-10-CM

## 2021-11-27 DIAGNOSIS — Z7901 Long term (current) use of anticoagulants: Secondary | ICD-10-CM | POA: Diagnosis not present

## 2021-11-27 DIAGNOSIS — N390 Urinary tract infection, site not specified: Secondary | ICD-10-CM | POA: Diagnosis not present

## 2021-11-27 DIAGNOSIS — E875 Hyperkalemia: Secondary | ICD-10-CM | POA: Diagnosis not present

## 2021-12-04 DIAGNOSIS — I4891 Unspecified atrial fibrillation: Secondary | ICD-10-CM | POA: Diagnosis not present

## 2021-12-04 DIAGNOSIS — E1165 Type 2 diabetes mellitus with hyperglycemia: Secondary | ICD-10-CM | POA: Diagnosis not present

## 2021-12-04 DIAGNOSIS — I10 Essential (primary) hypertension: Secondary | ICD-10-CM | POA: Diagnosis not present

## 2021-12-06 DIAGNOSIS — G4733 Obstructive sleep apnea (adult) (pediatric): Secondary | ICD-10-CM | POA: Diagnosis not present

## 2021-12-19 DIAGNOSIS — M65341 Trigger finger, right ring finger: Secondary | ICD-10-CM | POA: Diagnosis not present

## 2021-12-25 ENCOUNTER — Encounter (INDEPENDENT_AMBULATORY_CARE_PROVIDER_SITE_OTHER): Payer: Medicare Other | Admitting: Ophthalmology

## 2021-12-25 ENCOUNTER — Other Ambulatory Visit: Payer: Self-pay

## 2021-12-25 DIAGNOSIS — I10 Essential (primary) hypertension: Secondary | ICD-10-CM | POA: Diagnosis not present

## 2021-12-25 DIAGNOSIS — H353112 Nonexudative age-related macular degeneration, right eye, intermediate dry stage: Secondary | ICD-10-CM | POA: Diagnosis not present

## 2021-12-25 DIAGNOSIS — Z7901 Long term (current) use of anticoagulants: Secondary | ICD-10-CM | POA: Diagnosis not present

## 2021-12-25 DIAGNOSIS — H353221 Exudative age-related macular degeneration, left eye, with active choroidal neovascularization: Secondary | ICD-10-CM | POA: Diagnosis not present

## 2021-12-25 DIAGNOSIS — H43813 Vitreous degeneration, bilateral: Secondary | ICD-10-CM

## 2021-12-25 DIAGNOSIS — H35033 Hypertensive retinopathy, bilateral: Secondary | ICD-10-CM | POA: Diagnosis not present

## 2022-01-01 DIAGNOSIS — E1142 Type 2 diabetes mellitus with diabetic polyneuropathy: Secondary | ICD-10-CM | POA: Diagnosis not present

## 2022-01-01 DIAGNOSIS — B351 Tinea unguium: Secondary | ICD-10-CM | POA: Diagnosis not present

## 2022-01-01 DIAGNOSIS — L84 Corns and callosities: Secondary | ICD-10-CM | POA: Diagnosis not present

## 2022-01-01 DIAGNOSIS — M79676 Pain in unspecified toe(s): Secondary | ICD-10-CM | POA: Diagnosis not present

## 2022-01-15 ENCOUNTER — Ambulatory Visit: Payer: Medicare Other | Admitting: Podiatry

## 2022-01-29 ENCOUNTER — Ambulatory Visit: Payer: Medicare Other

## 2022-01-29 ENCOUNTER — Ambulatory Visit: Payer: Medicare Other | Admitting: Podiatry

## 2022-01-29 ENCOUNTER — Encounter: Payer: Self-pay | Admitting: Podiatry

## 2022-01-29 DIAGNOSIS — M7741 Metatarsalgia, right foot: Secondary | ICD-10-CM

## 2022-01-29 DIAGNOSIS — M2041 Other hammer toe(s) (acquired), right foot: Secondary | ICD-10-CM | POA: Diagnosis not present

## 2022-01-29 DIAGNOSIS — M79674 Pain in right toe(s): Secondary | ICD-10-CM

## 2022-01-29 DIAGNOSIS — E1142 Type 2 diabetes mellitus with diabetic polyneuropathy: Secondary | ICD-10-CM

## 2022-01-29 DIAGNOSIS — M79675 Pain in left toe(s): Secondary | ICD-10-CM

## 2022-01-29 DIAGNOSIS — L84 Corns and callosities: Secondary | ICD-10-CM

## 2022-01-29 DIAGNOSIS — B351 Tinea unguium: Secondary | ICD-10-CM

## 2022-01-29 DIAGNOSIS — M21619 Bunion of unspecified foot: Secondary | ICD-10-CM

## 2022-01-29 DIAGNOSIS — Z7901 Long term (current) use of anticoagulants: Secondary | ICD-10-CM | POA: Diagnosis not present

## 2022-01-29 NOTE — Progress Notes (Signed)
?  Subjective:  ?Patient ID: Natasha Glass, female    DOB: [birth year],   MRN: [id number] ? ?Chief Complaint  ?Patient presents with  ? Nail Problem  ?   ? bil middle toe pain-possible corn   ? ? ?81 y.o. female presents for concern of thickened elongated and painful nails that are difficult to trim. Requesting to have them trimmed today. Also has a callus on the tip of her right third digit that is painful .Relates occasional burning or tingling in her feet. Patient is diabetic and last A1c was 6.2.  ? . Denies any other pedal complaints. Denies n/v/f/c.  ?PCP: Josetta Huddle MD  ? ?Past Medical History:  ?Diagnosis Date  ? Anemia   ? Arthritis   ? DVT (deep venous thrombosis) ([place]) 01/2009  ? GERD (gastroesophageal reflux disease)   ? Gout   ? History of blood transfusion   ? 1990's  ? History of colonic polyps 09/2010  ? History of pneumonia   ? Hyperlipidemia   ? Hypertension   ? Lactose intolerance   ? 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 Migraine   ? Obesity   ? Osteopenia   ? PAF (paroxysmal atrial fibrillation) ([place])   ? Peripheral neuropathy   ? Pulmonary embolism ([place]) 01/2009  ? Sleep apnea   ? CPAP pressure 4.0-16.0  ? Thoracic ascending aortic aneurysm ([place])   ? Type II diabetes mellitus ([place])   ? 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 Urinary incontinence   ? ? ?Objective:  ?Physical Exam: ?Vascular: DP/PT pulses 2/4 bilateral. CFT <3 seconds. Absent hair growth on digits. Edema noted to bilateral lower extremities. Xerosis noted bilaterally.  ?Skin. No lacerations or abrasions bilateral feet. Nails 1-5 bilateral  are thickened discolored and elongated with subungual debris. Hyperkeratotic pre-ulcerative lesion noted to distal right third digit.  ?Musculoskeletal: MMT 5/5 bilateral lower extremities in DF, PF, Inversion and Eversion. Deceased ROM in DF of ankle joint. Hammered digits bilateral. Bunions bilateral. Tender to right ball of foot.  ?Neurological: Sensation intact to light  touch. Protective sensation diminished bilateral.  ? ? ?Assessment:  ? ?1. Type 2 diabetes mellitus with diabetic polyneuropathy, unspecified whether long term insulin use ([place])   ?2. Pain due to onychomycosis of toenails of both feet   ?3. Callus of foot   ?4. Hammertoe, bilateral   ?5. Bunion   ? ? ? ?Plan:  ?Patient was evaluated and treated and all questions answered. ?-Discussed and educated patient on diabetic foot care, especially with  ?regards to the vascular, neurological and musculoskeletal systems.  ?-Stressed the importance of good glycemic control and the detriment of not  ?controlling glucose levels in relation to the foot. ?-Discussed supportive shoes at all times and checking feet regularly.  ?-Mechanically debrided all nails 1-5 bilateral using sterile nail nipper and filed with dremel without incident  ?Hypekreratotic tissue trimmed without incident.  ?-DM shoes fit with Aaron Edelman  ?-Answered all patient questions ?-Patient to return  in 3 months for at risk foot care ?-Patient advised to call the office if any problems or questions arise in the meantime. ? ? ?Lorenda Peck, DPM  ? ? ?

## 2022-01-29 NOTE — Progress Notes (Signed)
SITUATION ?Reason for Consult: Evaluation for Prefabricated Diabetic Shoes and Custom Diabetic Inserts. ?Patient / Caregiver Report: Patient would like well fitting shoes ? ?OBJECTIVE DATA: ?Patient History / Diagnosis:  ?  ICD-10-CM   ?1. Type 2 diabetes mellitus with diabetic polyneuropathy, unspecified whether long term insulin use ([facility])  E11.42   ?  ?2. Hammertoe, bilateral  M20.41   ? M20.42   ?  ?3. Bunion  M21.619   ?  ?4. Callus of foot  L84   ?  ? ? ?Physician Treating Diabetes:  Josetta Huddle, MD ? ?Current or Previous Devices:   Historical user ? ?In-Person Foot Examination: ?Ulcers & Callousing:   Historical callus ?Deformities:    Hammertoes, bunion ?Sensation:    Compromised  ?Shoe Size:     10.5XW ? ?ORTHOTIC RECOMMENDATION ?Recommended Devices: ?- 1x pair prefabricated PDAC approved diabetic shoes; Patient Selected Apex A3200W Size 10.5XW ?- 3x pair custom-to-patient PDAC approved vacuum formed diabetic insoles. ? ?GOALS OF SHOES AND INSOLES ?- Reduce shear and pressure ?- Reduce / Prevent callus formation ?- Reduce / Prevent ulceration ?- Protect the fragile healing compromised diabetic foot. ? ?Patient would benefit from diabetic shoes and inserts as patient has diabetes mellitus and the patient has one or more of the following conditions: ?- History of partial or complete amputation of the foot ?- History of previous foot ulceration. ?- History of pre-ulcerative callus ?- Peripheral neuropathy with evidence of callus formation ?- Foot deformity ?- Poor circulation ? ?ACTIONS PERFORMED ?Potential out of pocket cost was communicated to patient. Patient understood and consented to measurement and casting. Patient was casted for insoles via crush box and measured for shoes via brannock device. Procedure was explained and patient tolerated procedure well. All questions were answered and concerns addressed. Casts were shipped to central fabrication for HOLD until Certificate of Medical Necessity or  otherwise necessary authorization from insurance is obtained. ? ?PLAN ?Shoes are to be ordered and casts released from hold once all appropriate paperwork is complete. Patient is to be contacted and scheduled for fitting once shoes and insoles have been fabricated and received. ? ?

## 2022-01-30 ENCOUNTER — Encounter (INDEPENDENT_AMBULATORY_CARE_PROVIDER_SITE_OTHER): Payer: Medicare Other | Admitting: Ophthalmology

## 2022-01-30 DIAGNOSIS — I10 Essential (primary) hypertension: Secondary | ICD-10-CM

## 2022-01-30 DIAGNOSIS — H353112 Nonexudative age-related macular degeneration, right eye, intermediate dry stage: Secondary | ICD-10-CM

## 2022-01-30 DIAGNOSIS — H35033 Hypertensive retinopathy, bilateral: Secondary | ICD-10-CM | POA: Diagnosis not present

## 2022-01-30 DIAGNOSIS — H35372 Puckering of macula, left eye: Secondary | ICD-10-CM

## 2022-01-30 DIAGNOSIS — H353221 Exudative age-related macular degeneration, left eye, with active choroidal neovascularization: Secondary | ICD-10-CM

## 2022-01-30 DIAGNOSIS — H43813 Vitreous degeneration, bilateral: Secondary | ICD-10-CM

## 2022-02-01 ENCOUNTER — Other Ambulatory Visit: Payer: Self-pay | Admitting: Internal Medicine

## 2022-02-01 DIAGNOSIS — M810 Age-related osteoporosis without current pathological fracture: Secondary | ICD-10-CM

## 2022-02-08 DIAGNOSIS — I4891 Unspecified atrial fibrillation: Secondary | ICD-10-CM | POA: Diagnosis not present

## 2022-02-08 DIAGNOSIS — I10 Essential (primary) hypertension: Secondary | ICD-10-CM | POA: Diagnosis not present

## 2022-02-08 DIAGNOSIS — E1165 Type 2 diabetes mellitus with hyperglycemia: Secondary | ICD-10-CM | POA: Diagnosis not present

## 2022-02-08 DIAGNOSIS — E782 Mixed hyperlipidemia: Secondary | ICD-10-CM | POA: Diagnosis not present

## 2022-02-08 DIAGNOSIS — M810 Age-related osteoporosis without current pathological fracture: Secondary | ICD-10-CM | POA: Diagnosis not present

## 2022-03-01 ENCOUNTER — Other Ambulatory Visit (HOSPITAL_COMMUNITY): Payer: Self-pay

## 2022-03-01 ENCOUNTER — Telehealth (HOSPITAL_COMMUNITY): Payer: Self-pay

## 2022-03-01 MED ORDER — METOPROLOL TARTRATE 25 MG PO TABS: 25.0000 mg | ORAL_TABLET | Freq: Two times a day (BID) | ORAL | 1 refills | Status: DC

## 2022-03-01 NOTE — Telephone Encounter (Signed)
Patient called stating that the last 3 days she haven't felt good. She stated on 05/18 real dizzy and heart rate 41. Today she still doesn't feel good. Reached out to [place] he advised to decrease Metoprolol from 30m BID to 244mBID and [place] call [place] Monday/Tuesday.

## 2022-03-04 DIAGNOSIS — Z7901 Long term (current) use of anticoagulants: Secondary | ICD-10-CM | POA: Diagnosis not present

## 2022-03-06 ENCOUNTER — Telehealth: Payer: Self-pay

## 2022-03-06 ENCOUNTER — Encounter (INDEPENDENT_AMBULATORY_CARE_PROVIDER_SITE_OTHER): Payer: Medicare Other | Admitting: Ophthalmology

## 2022-03-06 DIAGNOSIS — H35033 Hypertensive retinopathy, bilateral: Secondary | ICD-10-CM | POA: Diagnosis not present

## 2022-03-06 DIAGNOSIS — H43813 Vitreous degeneration, bilateral: Secondary | ICD-10-CM | POA: Diagnosis not present

## 2022-03-06 DIAGNOSIS — H353221 Exudative age-related macular degeneration, left eye, with active choroidal neovascularization: Secondary | ICD-10-CM

## 2022-03-06 DIAGNOSIS — H353112 Nonexudative age-related macular degeneration, right eye, intermediate dry stage: Secondary | ICD-10-CM

## 2022-03-06 DIAGNOSIS — I10 Essential (primary) hypertension: Secondary | ICD-10-CM

## 2022-03-06 NOTE — Telephone Encounter (Signed)
Received call stating patient received our letter and took the documentation to her physician. Patient stated that Dr. Inda Merlin faxed the paperwork to [place] and she would like a call back to confirm that we received it. Assured patient that we will call her back when Christan is back next week. Patient was satisfied.

## 2022-03-12 ENCOUNTER — Telehealth: Payer: Self-pay

## 2022-03-12 DIAGNOSIS — E782 Mixed hyperlipidemia: Secondary | ICD-10-CM | POA: Diagnosis not present

## 2022-03-12 DIAGNOSIS — I4891 Unspecified atrial fibrillation: Secondary | ICD-10-CM | POA: Diagnosis not present

## 2022-03-12 DIAGNOSIS — E1165 Type 2 diabetes mellitus with hyperglycemia: Secondary | ICD-10-CM | POA: Diagnosis not present

## 2022-03-12 DIAGNOSIS — M810 Age-related osteoporosis without current pathological fracture: Secondary | ICD-10-CM | POA: Diagnosis not present

## 2022-03-12 DIAGNOSIS — I10 Essential (primary) hypertension: Secondary | ICD-10-CM | POA: Diagnosis not present

## 2022-03-12 NOTE — Telephone Encounter (Signed)
Shoes ordered and insoles released from fabrication hold  Apex A3200W 10.2BM

## 2022-03-14 DIAGNOSIS — H3530 Unspecified macular degeneration: Secondary | ICD-10-CM | POA: Diagnosis not present

## 2022-03-14 DIAGNOSIS — E538 Deficiency of other specified B group vitamins: Secondary | ICD-10-CM | POA: Diagnosis not present

## 2022-03-14 DIAGNOSIS — R300 Dysuria: Secondary | ICD-10-CM | POA: Diagnosis not present

## 2022-03-14 DIAGNOSIS — Z0000 Encounter for general adult medical examination without abnormal findings: Secondary | ICD-10-CM | POA: Diagnosis not present

## 2022-03-14 DIAGNOSIS — D6869 Other thrombophilia: Secondary | ICD-10-CM | POA: Diagnosis not present

## 2022-03-14 DIAGNOSIS — E1165 Type 2 diabetes mellitus with hyperglycemia: Secondary | ICD-10-CM | POA: Diagnosis not present

## 2022-03-14 DIAGNOSIS — M109 Gout, unspecified: Secondary | ICD-10-CM | POA: Diagnosis not present

## 2022-03-14 DIAGNOSIS — M65341 Trigger finger, right ring finger: Secondary | ICD-10-CM | POA: Diagnosis not present

## 2022-03-14 DIAGNOSIS — E559 Vitamin D deficiency, unspecified: Secondary | ICD-10-CM | POA: Diagnosis not present

## 2022-03-14 DIAGNOSIS — E11319 Type 2 diabetes mellitus with unspecified diabetic retinopathy without macular edema: Secondary | ICD-10-CM | POA: Diagnosis not present

## 2022-03-14 DIAGNOSIS — I4891 Unspecified atrial fibrillation: Secondary | ICD-10-CM | POA: Diagnosis not present

## 2022-03-14 DIAGNOSIS — G4733 Obstructive sleep apnea (adult) (pediatric): Secondary | ICD-10-CM | POA: Diagnosis not present

## 2022-04-02 DIAGNOSIS — M810 Age-related osteoporosis without current pathological fracture: Secondary | ICD-10-CM | POA: Diagnosis not present

## 2022-04-02 DIAGNOSIS — I4891 Unspecified atrial fibrillation: Secondary | ICD-10-CM | POA: Diagnosis not present

## 2022-04-02 DIAGNOSIS — E1165 Type 2 diabetes mellitus with hyperglycemia: Secondary | ICD-10-CM | POA: Diagnosis not present

## 2022-04-02 DIAGNOSIS — M65341 Trigger finger, right ring finger: Secondary | ICD-10-CM | POA: Diagnosis not present

## 2022-04-02 DIAGNOSIS — I10 Essential (primary) hypertension: Secondary | ICD-10-CM | POA: Diagnosis not present

## 2022-04-02 DIAGNOSIS — E782 Mixed hyperlipidemia: Secondary | ICD-10-CM | POA: Diagnosis not present

## 2022-04-10 ENCOUNTER — Other Ambulatory Visit: Payer: Medicare Other

## 2022-04-10 ENCOUNTER — Encounter (INDEPENDENT_AMBULATORY_CARE_PROVIDER_SITE_OTHER): Payer: Medicare Other | Admitting: Ophthalmology

## 2022-04-10 DIAGNOSIS — H353112 Nonexudative age-related macular degeneration, right eye, intermediate dry stage: Secondary | ICD-10-CM | POA: Diagnosis not present

## 2022-04-10 DIAGNOSIS — I10 Essential (primary) hypertension: Secondary | ICD-10-CM | POA: Diagnosis not present

## 2022-04-10 DIAGNOSIS — H353221 Exudative age-related macular degeneration, left eye, with active choroidal neovascularization: Secondary | ICD-10-CM | POA: Diagnosis not present

## 2022-04-10 DIAGNOSIS — H35033 Hypertensive retinopathy, bilateral: Secondary | ICD-10-CM

## 2022-04-10 DIAGNOSIS — H43813 Vitreous degeneration, bilateral: Secondary | ICD-10-CM | POA: Diagnosis not present

## 2022-04-12 DIAGNOSIS — M65341 Trigger finger, right ring finger: Secondary | ICD-10-CM | POA: Diagnosis not present

## 2022-04-29 DIAGNOSIS — R0902 Hypoxemia: Secondary | ICD-10-CM | POA: Diagnosis not present

## 2022-04-29 DIAGNOSIS — R52 Pain, unspecified: Secondary | ICD-10-CM | POA: Diagnosis not present

## 2022-04-29 DIAGNOSIS — I4891 Unspecified atrial fibrillation: Secondary | ICD-10-CM | POA: Diagnosis not present

## 2022-04-30 ENCOUNTER — Telehealth (HOSPITAL_COMMUNITY): Payer: Self-pay | Admitting: *Deleted

## 2022-04-30 ENCOUNTER — Emergency Department (HOSPITAL_COMMUNITY): Payer: Medicare Other

## 2022-04-30 ENCOUNTER — Ambulatory Visit: Payer: Medicare Other | Admitting: Podiatry

## 2022-04-30 ENCOUNTER — Other Ambulatory Visit: Payer: Self-pay

## 2022-04-30 ENCOUNTER — Inpatient Hospital Stay (HOSPITAL_COMMUNITY)
Admission: EM | Admit: 2022-04-30 | Discharge: 2022-05-05 | DRG: 177 | Disposition: A | Discharge status: Home Health | Payer: Medicare Other | Attending: Internal Medicine | Admitting: Internal Medicine

## 2022-04-30 ENCOUNTER — Encounter (HOSPITAL_COMMUNITY): Payer: Self-pay | Admitting: Emergency Medicine

## 2022-04-30 DIAGNOSIS — K219 Gastro-esophageal reflux disease without esophagitis: Secondary | ICD-10-CM

## 2022-04-30 DIAGNOSIS — Z7984 Long term (current) use of oral hypoglycemic drugs: Secondary | ICD-10-CM | POA: Diagnosis not present

## 2022-04-30 DIAGNOSIS — J9601 Acute respiratory failure with hypoxia: Secondary | ICD-10-CM | POA: Diagnosis not present

## 2022-04-30 DIAGNOSIS — Z9989 Dependence on other enabling machines and devices: Secondary | ICD-10-CM

## 2022-04-30 DIAGNOSIS — R55 Syncope and collapse: Secondary | ICD-10-CM

## 2022-04-30 DIAGNOSIS — M2041 Other hammer toe(s) (acquired), right foot: Secondary | ICD-10-CM | POA: Diagnosis not present

## 2022-04-30 DIAGNOSIS — Z79899 Other long term (current) drug therapy: Secondary | ICD-10-CM | POA: Diagnosis not present

## 2022-04-30 DIAGNOSIS — Z6841 Body Mass Index (BMI) 40.0 and over, adult: Secondary | ICD-10-CM

## 2022-04-30 DIAGNOSIS — R059 Cough, unspecified: Secondary | ICD-10-CM | POA: Diagnosis not present

## 2022-04-30 DIAGNOSIS — M8580 Other specified disorders of bone density and structure, unspecified site: Secondary | ICD-10-CM | POA: Diagnosis not present

## 2022-04-30 DIAGNOSIS — D696 Thrombocytopenia, unspecified: Secondary | ICD-10-CM | POA: Diagnosis not present

## 2022-04-30 DIAGNOSIS — Z86718 Personal history of other venous thrombosis and embolism: Secondary | ICD-10-CM

## 2022-04-30 DIAGNOSIS — E119 Type 2 diabetes mellitus without complications: Secondary | ICD-10-CM

## 2022-04-30 DIAGNOSIS — Z800 Family history of malignant neoplasm of digestive organs: Secondary | ICD-10-CM

## 2022-04-30 DIAGNOSIS — Y92009 Unspecified place in unspecified non-institutional (private) residence as the place of occurrence of the external cause: Secondary | ICD-10-CM | POA: Diagnosis not present

## 2022-04-30 DIAGNOSIS — I110 Hypertensive heart disease with heart failure: Secondary | ICD-10-CM | POA: Diagnosis present

## 2022-04-30 DIAGNOSIS — I5032 Chronic diastolic (congestive) heart failure: Secondary | ICD-10-CM

## 2022-04-30 DIAGNOSIS — E782 Mixed hyperlipidemia: Secondary | ICD-10-CM

## 2022-04-30 DIAGNOSIS — E876 Hypokalemia: Secondary | ICD-10-CM | POA: Diagnosis not present

## 2022-04-30 DIAGNOSIS — I4891 Unspecified atrial fibrillation: Secondary | ICD-10-CM

## 2022-04-30 DIAGNOSIS — M7741 Metatarsalgia, right foot: Secondary | ICD-10-CM | POA: Diagnosis not present

## 2022-04-30 DIAGNOSIS — I480 Paroxysmal atrial fibrillation: Secondary | ICD-10-CM | POA: Diagnosis present

## 2022-04-30 DIAGNOSIS — H3530 Unspecified macular degeneration: Secondary | ICD-10-CM | POA: Diagnosis not present

## 2022-04-30 DIAGNOSIS — R053 Chronic cough: Secondary | ICD-10-CM | POA: Diagnosis present

## 2022-04-30 DIAGNOSIS — Z86010 Personal history of colonic polyps: Secondary | ICD-10-CM

## 2022-04-30 DIAGNOSIS — G4733 Obstructive sleep apnea (adult) (pediatric): Secondary | ICD-10-CM

## 2022-04-30 DIAGNOSIS — Z882 Allergy status to sulfonamides status: Secondary | ICD-10-CM

## 2022-04-30 DIAGNOSIS — M25461 Effusion, right knee: Secondary | ICD-10-CM | POA: Diagnosis not present

## 2022-04-30 DIAGNOSIS — E1169 Type 2 diabetes mellitus with other specified complication: Secondary | ICD-10-CM | POA: Diagnosis not present

## 2022-04-30 DIAGNOSIS — I7121 Aneurysm of the ascending aorta, without rupture: Secondary | ICD-10-CM | POA: Diagnosis not present

## 2022-04-30 DIAGNOSIS — Z888 Allergy status to other drugs, medicaments and biological substances status: Secondary | ICD-10-CM

## 2022-04-30 DIAGNOSIS — Z86711 Personal history of pulmonary embolism: Secondary | ICD-10-CM

## 2022-04-30 DIAGNOSIS — S8991XA Unspecified injury of right lower leg, initial encounter: Secondary | ICD-10-CM

## 2022-04-30 DIAGNOSIS — M109 Gout, unspecified: Secondary | ICD-10-CM | POA: Diagnosis present

## 2022-04-30 DIAGNOSIS — Z87440 Personal history of urinary (tract) infections: Secondary | ICD-10-CM

## 2022-04-30 DIAGNOSIS — W19XXXA Unspecified fall, initial encounter: Secondary | ICD-10-CM

## 2022-04-30 DIAGNOSIS — L84 Corns and callosities: Secondary | ICD-10-CM | POA: Diagnosis not present

## 2022-04-30 DIAGNOSIS — Z887 Allergy status to serum and vaccine status: Secondary | ICD-10-CM

## 2022-04-30 DIAGNOSIS — M7989 Other specified soft tissue disorders: Secondary | ICD-10-CM | POA: Diagnosis not present

## 2022-04-30 DIAGNOSIS — Z96653 Presence of artificial knee joint, bilateral: Secondary | ICD-10-CM | POA: Diagnosis present

## 2022-04-30 DIAGNOSIS — Z7901 Long term (current) use of anticoagulants: Secondary | ICD-10-CM | POA: Diagnosis not present

## 2022-04-30 DIAGNOSIS — Z885 Allergy status to narcotic agent status: Secondary | ICD-10-CM

## 2022-04-30 DIAGNOSIS — U071 COVID-19: Principal | ICD-10-CM

## 2022-04-30 DIAGNOSIS — E1142 Type 2 diabetes mellitus with diabetic polyneuropathy: Secondary | ICD-10-CM | POA: Diagnosis not present

## 2022-04-30 DIAGNOSIS — Z9181 History of falling: Secondary | ICD-10-CM

## 2022-04-30 DIAGNOSIS — R079 Chest pain, unspecified: Secondary | ICD-10-CM

## 2022-04-30 DIAGNOSIS — E739 Lactose intolerance, unspecified: Secondary | ICD-10-CM | POA: Diagnosis present

## 2022-04-30 DIAGNOSIS — Z8701 Personal history of pneumonia (recurrent): Secondary | ICD-10-CM

## 2022-04-30 DIAGNOSIS — Z90710 Acquired absence of both cervix and uterus: Secondary | ICD-10-CM

## 2022-04-30 DIAGNOSIS — M25561 Pain in right knee: Secondary | ICD-10-CM | POA: Diagnosis present

## 2022-04-30 DIAGNOSIS — Z801 Family history of malignant neoplasm of trachea, bronchus and lung: Secondary | ICD-10-CM

## 2022-04-30 DIAGNOSIS — Z881 Allergy status to other antibiotic agents status: Secondary | ICD-10-CM

## 2022-04-30 DIAGNOSIS — R7401 Elevation of levels of liver transaminase levels: Secondary | ICD-10-CM | POA: Diagnosis not present

## 2022-04-30 DIAGNOSIS — R0602 Shortness of breath: Secondary | ICD-10-CM | POA: Diagnosis not present

## 2022-04-30 DIAGNOSIS — Z8249 Family history of ischemic heart disease and other diseases of the circulatory system: Secondary | ICD-10-CM

## 2022-04-30 DIAGNOSIS — I10 Essential (primary) hypertension: Secondary | ICD-10-CM | POA: Diagnosis not present

## 2022-04-30 DIAGNOSIS — M810 Age-related osteoporosis without current pathological fracture: Secondary | ICD-10-CM | POA: Diagnosis not present

## 2022-04-30 HISTORY — DX: Heart failure, unspecified: I50.9

## 2022-04-30 HISTORY — DX: Cardiac arrhythmia, unspecified: I49.9

## 2022-04-30 LAB — PROTIME-INR
INR: 1.8 — ABNORMAL HIGH (ref 0.8–1.2)
Prothrombin Time: 20.2 seconds — ABNORMAL HIGH (ref 11.4–15.2)

## 2022-04-30 LAB — COMPREHENSIVE METABOLIC PANEL
ALT: 17 U/L (ref 0–44)
AST: 54 U/L — ABNORMAL HIGH (ref 15–41)
Albumin: 3.6 g/dL (ref 3.5–5.0)
Alkaline Phosphatase: 50 U/L (ref 38–126)
Anion gap: 12 (ref 5–15)
BUN: 19 mg/dL (ref 8–23)
CO2: 24 mmol/L (ref 22–32)
Calcium: 9.4 mg/dL (ref 8.9–10.3)
Chloride: 100 mmol/L (ref 98–111)
Creatinine, Ser: 0.81 mg/dL (ref 0.44–1.00)
GFR, Estimated: >60 mL/min (ref >60)
Glucose, Bld: 137 mg/dL — ABNORMAL HIGH (ref 70–99)
Potassium: 4.1 mmol/L (ref 3.5–5.1)
Sodium: 136 mmol/L (ref 135–145)
Total Bilirubin: 0.6 mg/dL (ref 0.3–1.2)
Total Protein: 7.5 g/dL (ref 6.5–8.1)

## 2022-04-30 LAB — URINALYSIS, ROUTINE W REFLEX MICROSCOPIC
Bilirubin Urine: NEGATIVE
Glucose, UA: NEGATIVE mg/dL
Ketones, ur: NEGATIVE mg/dL
Leukocytes,Ua: NEGATIVE
Nitrite: NEGATIVE
Protein, ur: NEGATIVE mg/dL
Specific Gravity, Urine: 1.005 (ref 1.005–1.030)
pH: 6 (ref 5.0–8.0)

## 2022-04-30 LAB — CBC
HCT: 42.5 % (ref 36.0–46.0)
Hemoglobin: 13.1 g/dL (ref 12.0–15.0)
MCH: 27.7 pg (ref 26.0–34.0)
MCHC: 30.8 g/dL (ref 30.0–36.0)
MCV: 89.9 fL (ref 80.0–100.0)
Platelets: 119 10*3/uL — ABNORMAL LOW (ref 150–400)
RBC: 4.73 MIL/uL (ref 3.87–5.11)
RDW: 17.2 % — ABNORMAL HIGH (ref 11.5–15.5)
WBC: 7.9 10*3/uL (ref 4.0–10.5)
nRBC: 0 % (ref 0.0–0.2)

## 2022-04-30 LAB — RESP PANEL BY RT-PCR (FLU A&B, COVID) ARPGX2
Influenza A by PCR: NEGATIVE
Influenza B by PCR: NEGATIVE
SARS Coronavirus 2 by RT PCR: POSITIVE — AB

## 2022-04-30 LAB — BRAIN NATRIURETIC PEPTIDE: B Natriuretic Peptide: 150.1 pg/mL — ABNORMAL HIGH (ref 0.0–100.0)

## 2022-04-30 LAB — CBG MONITORING, ED: Glucose-Capillary: 123 mg/dL — ABNORMAL HIGH (ref 70–99)

## 2022-04-30 LAB — D-DIMER, QUANTITATIVE: D-Dimer, Quant: 1.16 ug/mL-FEU — ABNORMAL HIGH (ref 0.00–0.50)

## 2022-04-30 LAB — TROPONIN I (HIGH SENSITIVITY)
Troponin I (High Sensitivity): 17 ng/L (ref <18)
Troponin I (High Sensitivity): 16 ng/L (ref <18)

## 2022-04-30 MED ORDER — ALBUTEROL SULFATE HFA 108 (90 BASE) MCG/ACT IN AERS
2.0000 | INHALATION_SPRAY | Freq: Four times a day (QID) | RESPIRATORY_TRACT | Status: DC
  Administered 2022-05-01 (×4): 2 via RESPIRATORY_TRACT
  Filled 2022-04-30: qty 6.7

## 2022-04-30 MED ORDER — ONDANSETRON HCL 4 MG/2ML IJ SOLN: 4.0000 mg | Freq: Four times a day (QID) | INTRAMUSCULAR | Status: DC | PRN

## 2022-04-30 MED ORDER — ACETAMINOPHEN 325 MG PO TABS
650.0000 mg | ORAL_TABLET | Freq: Four times a day (QID) | ORAL | Status: DC | PRN
  Administered 2022-05-01 – 2022-05-03 (×5): 650 mg via ORAL
  Filled 2022-04-30 (×5): qty 2

## 2022-04-30 MED ORDER — ASCORBIC ACID 500 MG PO TABS
500.0000 mg | ORAL_TABLET | Freq: Every day | ORAL | Status: DC
  Administered 2022-05-01 – 2022-05-05 (×5): 500 mg via ORAL
  Filled 2022-04-30 (×5): qty 1

## 2022-04-30 MED ORDER — INSULIN ASPART 100 UNIT/ML IJ SOLN
0.0000 [IU] | Freq: Three times a day (TID) | INTRAMUSCULAR | Status: DC
  Administered 2022-05-01: 2 [IU] via SUBCUTANEOUS
  Administered 2022-05-01: 3 [IU] via SUBCUTANEOUS
  Administered 2022-05-01 – 2022-05-02 (×3): 5 [IU] via SUBCUTANEOUS
  Administered 2022-05-02: 3 [IU] via SUBCUTANEOUS
  Administered 2022-05-02: 8 [IU] via SUBCUTANEOUS
  Administered 2022-05-03: 5 [IU] via SUBCUTANEOUS
  Administered 2022-05-03: 3 [IU] via SUBCUTANEOUS
  Administered 2022-05-03: 8 [IU] via SUBCUTANEOUS
  Administered 2022-05-03 – 2022-05-04 (×2): 5 [IU] via SUBCUTANEOUS
  Administered 2022-05-04: 3 [IU] via SUBCUTANEOUS
  Administered 2022-05-04: 5 [IU] via SUBCUTANEOUS
  Administered 2022-05-05 (×2): 3 [IU] via SUBCUTANEOUS

## 2022-04-30 MED ORDER — MOLNUPIRAVIR EUA 200MG CAPSULE
4.0000 | ORAL_CAPSULE | Freq: Two times a day (BID) | ORAL | Status: DC
  Administered 2022-05-01 – 2022-05-05 (×9): 800 mg via ORAL
  Filled 2022-04-30 (×5): qty 4

## 2022-04-30 MED ORDER — DILTIAZEM LOAD VIA INFUSION
10.0000 mg | Freq: Once | INTRAVENOUS | Status: AC
  Administered 2022-04-30: 10 mg via INTRAVENOUS
  Filled 2022-04-30: qty 10

## 2022-04-30 MED ORDER — METOPROLOL TARTRATE 25 MG PO TABS
25.0000 mg | ORAL_TABLET | Freq: Two times a day (BID) | ORAL | Status: DC
  Administered 2022-04-30: 25 mg via ORAL
  Filled 2022-04-30: qty 1

## 2022-04-30 MED ORDER — GUAIFENESIN-DM 100-10 MG/5ML PO SYRP
10.0000 mL | ORAL_SOLUTION | ORAL | Status: DC | PRN
  Administered 2022-05-01 – 2022-05-03 (×6): 10 mL via ORAL
  Filled 2022-04-30 (×6): qty 10

## 2022-04-30 MED ORDER — FUROSEMIDE 10 MG/ML IJ SOLN
40.0000 mg | Freq: Once | INTRAMUSCULAR | Status: AC
  Administered 2022-04-30: 40 mg via INTRAVENOUS
  Filled 2022-04-30: qty 4

## 2022-04-30 MED ORDER — DILTIAZEM HCL-DEXTROSE 125-5 MG/125ML-% IV SOLN (PREMIX)
5.0000 mg/h | INTRAVENOUS | Status: DC
  Administered 2022-04-30 – 2022-05-01 (×2): 5 mg/h via INTRAVENOUS
  Administered 2022-05-01: 12.5 mg/h via INTRAVENOUS
  Filled 2022-04-30 (×2): qty 125

## 2022-04-30 MED ORDER — POLYETHYLENE GLYCOL 3350 17 G PO PACK
17.0000 g | PACK | Freq: Every day | ORAL | Status: DC | PRN
Start: 2022-04-30 — End: 2022-05-05

## 2022-04-30 MED ORDER — LOSARTAN POTASSIUM 25 MG PO TABS: 25.0000 mg | ORAL_TABLET | Freq: Every day | ORAL | Status: DC

## 2022-04-30 MED ORDER — HEPARIN (PORCINE) 25000 UT/250ML-% IV SOLN
1400.0000 [IU]/h | INTRAVENOUS | Status: DC
  Administered 2022-04-30: 1300 [IU]/h via INTRAVENOUS
  Administered 2022-05-01: 1550 [IU]/h via INTRAVENOUS
  Administered 2022-05-02: 1400 [IU]/h via INTRAVENOUS
  Filled 2022-04-30 (×3): qty 250

## 2022-04-30 MED ORDER — ALLOPURINOL 300 MG PO TABS
150.0000 mg | ORAL_TABLET | Freq: Every evening | ORAL | Status: DC
  Administered 2022-04-30 – 2022-05-04 (×5): 150 mg via ORAL
  Filled 2022-04-30 (×2): qty 1
  Filled 2022-04-30: qty 2
  Filled 2022-04-30 (×2): qty 1

## 2022-04-30 MED ORDER — ALBUTEROL SULFATE HFA 108 (90 BASE) MCG/ACT IN AERS: 2.0000 | INHALATION_SPRAY | RESPIRATORY_TRACT | Status: DC | PRN

## 2022-04-30 MED ORDER — ZINC SULFATE 220 (50 ZN) MG PO CAPS
220.0000 mg | ORAL_CAPSULE | Freq: Every day | ORAL | Status: DC
  Administered 2022-05-01 – 2022-05-05 (×5): 220 mg via ORAL
  Filled 2022-04-30 (×5): qty 1

## 2022-04-30 MED ORDER — SODIUM CHLORIDE 0.9% FLUSH
3.0000 mL | Freq: Two times a day (BID) | INTRAVENOUS | Status: DC
  Administered 2022-04-30 – 2022-05-05 (×9): 3 mL via INTRAVENOUS

## 2022-04-30 MED ORDER — IPRATROPIUM-ALBUTEROL 0.5-2.5 (3) MG/3ML IN SOLN
3.0000 mL | Freq: Once | RESPIRATORY_TRACT | Status: DC
  Filled 2022-04-30: qty 3

## 2022-04-30 MED ORDER — ROSUVASTATIN CALCIUM 5 MG PO TABS
5.0000 mg | ORAL_TABLET | Freq: Every day | ORAL | Status: DC
  Administered 2022-05-01 – 2022-05-05 (×5): 5 mg via ORAL
  Filled 2022-04-30 (×5): qty 1

## 2022-04-30 MED ORDER — DOFETILIDE 250 MCG PO CAPS
250.0000 ug | ORAL_CAPSULE | Freq: Two times a day (BID) | ORAL | Status: DC
  Administered 2022-04-30 – 2022-05-05 (×10): 250 ug via ORAL
  Filled 2022-04-30 (×10): qty 1

## 2022-04-30 MED ORDER — NIRMATRELVIR/RITONAVIR (PAXLOVID)TABLET: 3.0000 | ORAL_TABLET | Freq: Two times a day (BID) | ORAL | Status: DC

## 2022-04-30 MED ORDER — ONDANSETRON HCL 4 MG PO TABS: 4.0000 mg | ORAL_TABLET | Freq: Four times a day (QID) | ORAL | Status: DC | PRN

## 2022-04-30 MED ORDER — PANTOPRAZOLE SODIUM 40 MG PO TBEC
40.0000 mg | DELAYED_RELEASE_TABLET | Freq: Every day | ORAL | Status: DC
  Administered 2022-04-30 – 2022-05-05 (×6): 40 mg via ORAL
  Filled 2022-04-30 (×6): qty 1

## 2022-04-30 MED ORDER — FUROSEMIDE 40 MG PO TABS
40.0000 mg | ORAL_TABLET | Freq: Every day | ORAL | Status: DC
  Administered 2022-05-02 – 2022-05-04 (×3): 40 mg via ORAL
  Filled 2022-04-30 (×2): qty 1
  Filled 2022-04-30: qty 2
  Filled 2022-04-30: qty 1

## 2022-04-30 MED ORDER — HYDRALAZINE HCL 20 MG/ML IJ SOLN: 10.0000 mg | Freq: Four times a day (QID) | INTRAMUSCULAR | Status: DC | PRN

## 2022-04-30 MED ORDER — ACETAMINOPHEN 650 MG RE SUPP: 650.0000 mg | Freq: Four times a day (QID) | RECTAL | Status: DC | PRN

## 2022-04-30 MED ORDER — FENTANYL CITRATE PF 50 MCG/ML IJ SOSY
50.0000 ug | PREFILLED_SYRINGE | Freq: Once | INTRAMUSCULAR | Status: AC
  Administered 2022-04-30: 50 ug via INTRAVENOUS
  Filled 2022-04-30: qty 1
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
## 2022-04-30 NOTE — Assessment & Plan Note (Addendum)
   Patient has been experiencing severe episodic weakness likely secondary to COVID-19 infection with bouts of rapid atrial fibrillation  This is likely what precipitated the patient's fall  Complains of right knee pain have been evaluated with both x-ray and CT imaging of the right knee which revealed no evidence of damage to the hardware or bony injury  Likely simply soft tissue injury that is [place]-limiting  We will obtain PT evaluation  Addressing underlying atrial fibrillation as above

## 2022-04-30 NOTE — Assessment & Plan Note (Addendum)
.   Patient presenting with atrial fibrillation with rapid ventricular response . Onset of rapid atrial fibrillation likely secondary to COVID-19 infection. . Per ER provider discussion with Dr. Ellyn Hack with cardiology, patient continued on home regimen of Tikosyn in addition to placing patient on diltiazem infusion . Additionally continuing patient's home regimen of oral metoprolol as blood pressure tolerates . Patient typically on Coumadin for anticoagulation in the outpatient setting with INR here of 1.8.  Per cardiology recommendations we will transition to heparin infusion for now until cardiology consultation and consideration of cardioversion can be made. . We will keep patient n.p.o. after midnight in case they wish to proceed with cardioversion tomorrow, albeit unlikely . Obtaining echocardiogram in the morning . Monitoring patient on telemetry . Cardiology to evaluate in the morning, their input is appreciated. . Monitoring patient in progressive unit

## 2022-04-30 NOTE — ED Triage Notes (Signed)
Pt reports intermittent afib since last Thursday.  Appt scheduled at [facility] this Thursday but she cancelled it since coming to ED.  Fell last night because "legs felt rubbery".  Denies LOC.  Reports R knee pain since fall.

## 2022-04-30 NOTE — Progress Notes (Signed)
ANTICOAGULATION CONSULT NOTE - Initial Consult  Pharmacy Consult for heparin Indication: atrial fibrillation  Allergies  Allergen Reactions   Bactrim [Sulfamethoxazole-Trimethoprim]     Drop in BP   Morphine And Related Other (See Comments)    Unresponsive-Very bad reaction.  Can take hydrocodone.    Oysters [Shellfish Allergy] Shortness Of Breath, Itching, Swelling and Rash   Amlodipine Swelling and Other (See Comments)    Of legs   Percocet [Oxycodone-Acetaminophen] Other (See Comments)    hallucinations   Tdap [Tetanus-Diphth-Acell Pertussis] Itching, Swelling and Rash   Tetanus Toxoids Itching, Swelling and Rash   Tramadol Swelling and Other (See Comments)    Of legs   Carbamazepine Other (See Comments)   Lactose Intolerance (Gi)    Macrodantin [Nitrofurantoin] Other (See Comments)   Other Other (See Comments)   Tetanus-Diphtheria Toxoids Td Other (See Comments)   Topiramate Other (See Comments)   Diovan [Valsartan] Other (See Comments)    Cough   Diphtheria Toxoid Itching, Swelling and Rash   Macrodantin [Nitrofurantoin Macrocrystal] Other (See Comments)    unspecified    Patient Measurements:   Heparin Dosing Weight: 84kg  Vital Signs: Temp: 99 F (37.2 C) (07/18 1226) Temp Source: Oral (07/18 1226) BP: 124/59 (07/18 1645) Pulse Rate: 105 (07/18 1645)  Labs: Recent Labs    04/30/22 1311 04/30/22 1421 04/30/22 1625  HGB 13.1  --   --   HCT 42.5  --   --   PLT 119*  --   --   LABPROT  --  20.2*  --   INR  --  1.8*  --   CREATININE 0.81  --   --   TROPONINIHS 17  --  16    CrCl cannot be calculated (Unknown ideal weight.).   Medical History: Past Medical History:  Diagnosis Date   Anemia    Arthritis    DVT (deep venous thrombosis) ([place]) 01/2009   GERD (gastroesophageal reflux disease)    Gout    History of blood transfusion    1990's   History of colonic polyps 09/2010   History of pneumonia    Hyperlipidemia    Hypertension    Lactose  intolerance    Macular degeneration, dry    bilateral   Migraine    Obesity    Osteopenia    PAF (paroxysmal atrial fibrillation) (HCC)    Peripheral neuropathy    Pulmonary embolism ([place]) 01/2009   Sleep apnea    CPAP pressure 4.0-16.0   Thoracic ascending aortic aneurysm (HCC)    Type II diabetes mellitus (HCC)    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

## 2022-04-30 NOTE — Progress Notes (Signed)
Orthopedic Tech Progress Note Patient Details:  Natasha Glass 1941-01-14 [id number]  Ortho Devices Type of Ortho Device: Knee Immobilizer Ortho Device/Splint Location: RLE Ortho Device/Splint Interventions: Ordered, Application   Post Interventions Patient Tolerated: Well Instructions Provided: [facility] [place] 04/30/2022, 6:21 PM

## 2022-04-30 NOTE — Assessment & Plan Note (Signed)
.   Continuing home regimen of lipid lowering therapy.  

## 2022-04-30 NOTE — Telephone Encounter (Signed)
Patient called stating she fell this morning and is unable to walk - also noticed her HR is 140s. Pt has not taken her morning medications. Encouraged to proceed to ER for further eval and go ahead and take her medications as prescribed.

## 2022-04-30 NOTE — Assessment & Plan Note (Signed)
.   Continuing home regimen of CPAP nightly  

## 2022-04-30 NOTE — Assessment & Plan Note (Signed)
.   Resume patients home regimen of oral antihypertensives . Titrate antihypertensive regimen as necessary to achieve adequate BP control . PRN intravenous antihypertensives for excessively elevated blood pressure   

## 2022-04-30 NOTE — Assessment & Plan Note (Signed)
   Continuing home regimen of allopurinol

## 2022-04-30 NOTE — Assessment & Plan Note (Signed)
.   Patient been placed on Accu-Cheks before every meal and nightly with sliding scale insulin . Holding home regimen of oral hypoglycemics . Hemoglobin A1C ordered . Diabetic Diet
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
## 2022-04-30 NOTE — Assessment & Plan Note (Signed)
   Episodes of atypical chest discomfort likely related to rapid atrial fibrillation  Serial cardiac enzymes here are unremarkable  No evidence of dynamic ST segment change on EKG  Continue to monitor on telemetry  Cardiology consulted and therefore they can determine whether further ischemic assessment is necessary
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
## 2022-04-30 NOTE — Assessment & Plan Note (Signed)
Protonix 40 mg daily

## 2022-04-30 NOTE — ED Notes (Signed)
Got patient some warm blankets placed another pulse oxy patient is resting with call bell in reach and family at bedside

## 2022-04-30 NOTE — Assessment & Plan Note (Signed)
.   No clinical evidence of cardiogenic volume overload . Strict input and output monitoring . Daily weights . Low-sodium diet . Continue home regimen of Lasix

## 2022-04-30 NOTE — ED Notes (Signed)
Patient transported to CT 

## 2022-05-01 ENCOUNTER — Observation Stay (HOSPITAL_COMMUNITY): Payer: Medicare Other

## 2022-05-01 ENCOUNTER — Encounter (HOSPITAL_COMMUNITY): Payer: Self-pay | Admitting: Internal Medicine

## 2022-05-01 DIAGNOSIS — E119 Type 2 diabetes mellitus without complications: Secondary | ICD-10-CM | POA: Diagnosis not present

## 2022-05-01 DIAGNOSIS — D696 Thrombocytopenia, unspecified: Secondary | ICD-10-CM | POA: Diagnosis present

## 2022-05-01 DIAGNOSIS — I480 Paroxysmal atrial fibrillation: Secondary | ICD-10-CM

## 2022-05-01 DIAGNOSIS — Y92009 Unspecified place in unspecified non-institutional (private) residence as the place of occurrence of the external cause: Secondary | ICD-10-CM | POA: Diagnosis not present

## 2022-05-01 DIAGNOSIS — Z79899 Other long term (current) drug therapy: Secondary | ICD-10-CM | POA: Diagnosis not present

## 2022-05-01 DIAGNOSIS — Z9989 Dependence on other enabling machines and devices: Secondary | ICD-10-CM | POA: Diagnosis not present

## 2022-05-01 DIAGNOSIS — Z800 Family history of malignant neoplasm of digestive organs: Secondary | ICD-10-CM | POA: Diagnosis not present

## 2022-05-01 DIAGNOSIS — I110 Hypertensive heart disease with heart failure: Secondary | ICD-10-CM | POA: Diagnosis present

## 2022-05-01 DIAGNOSIS — K219 Gastro-esophageal reflux disease without esophagitis: Secondary | ICD-10-CM | POA: Diagnosis present

## 2022-05-01 DIAGNOSIS — E1169 Type 2 diabetes mellitus with other specified complication: Secondary | ICD-10-CM | POA: Diagnosis present

## 2022-05-01 DIAGNOSIS — I4891 Unspecified atrial fibrillation: Secondary | ICD-10-CM

## 2022-05-01 DIAGNOSIS — I7121 Aneurysm of the ascending aorta, without rupture: Secondary | ICD-10-CM | POA: Diagnosis present

## 2022-05-01 DIAGNOSIS — Z7984 Long term (current) use of oral hypoglycemic drugs: Secondary | ICD-10-CM | POA: Diagnosis not present

## 2022-05-01 DIAGNOSIS — E876 Hypokalemia: Secondary | ICD-10-CM | POA: Diagnosis not present

## 2022-05-01 DIAGNOSIS — H3530 Unspecified macular degeneration: Secondary | ICD-10-CM | POA: Diagnosis present

## 2022-05-01 DIAGNOSIS — M8580 Other specified disorders of bone density and structure, unspecified site: Secondary | ICD-10-CM | POA: Diagnosis present

## 2022-05-01 DIAGNOSIS — J9601 Acute respiratory failure with hypoxia: Secondary | ICD-10-CM | POA: Diagnosis present

## 2022-05-01 DIAGNOSIS — Z7901 Long term (current) use of anticoagulants: Secondary | ICD-10-CM | POA: Diagnosis not present

## 2022-05-01 DIAGNOSIS — I5032 Chronic diastolic (congestive) heart failure: Secondary | ICD-10-CM | POA: Diagnosis present

## 2022-05-01 DIAGNOSIS — Z6841 Body Mass Index (BMI) 40.0 and over, adult: Secondary | ICD-10-CM | POA: Diagnosis not present

## 2022-05-01 DIAGNOSIS — M109 Gout, unspecified: Secondary | ICD-10-CM | POA: Diagnosis present

## 2022-05-01 DIAGNOSIS — W19XXXA Unspecified fall, initial encounter: Secondary | ICD-10-CM | POA: Diagnosis not present

## 2022-05-01 DIAGNOSIS — G4733 Obstructive sleep apnea (adult) (pediatric): Secondary | ICD-10-CM | POA: Diagnosis present

## 2022-05-01 DIAGNOSIS — U071 COVID-19: Secondary | ICD-10-CM | POA: Diagnosis present

## 2022-05-01 DIAGNOSIS — E782 Mixed hyperlipidemia: Secondary | ICD-10-CM | POA: Diagnosis present

## 2022-05-01 DIAGNOSIS — R079 Chest pain, unspecified: Secondary | ICD-10-CM | POA: Diagnosis not present

## 2022-05-01 DIAGNOSIS — R7401 Elevation of levels of liver transaminase levels: Secondary | ICD-10-CM | POA: Diagnosis present

## 2022-05-01 DIAGNOSIS — I10 Essential (primary) hypertension: Secondary | ICD-10-CM | POA: Diagnosis not present

## 2022-05-01 DIAGNOSIS — R053 Chronic cough: Secondary | ICD-10-CM | POA: Diagnosis present

## 2022-05-01 DIAGNOSIS — M25561 Pain in right knee: Secondary | ICD-10-CM | POA: Diagnosis present

## 2022-05-01 LAB — PROTIME-INR
INR: 2 — ABNORMAL HIGH (ref 0.8–1.2)
Prothrombin Time: 22.7 seconds — ABNORMAL HIGH (ref 11.4–15.2)

## 2022-05-01 LAB — HEMOGLOBIN A1C
Hgb A1c MFr Bld: 6.4 % — ABNORMAL HIGH (ref 4.8–5.6)
Mean Plasma Glucose: 136.98 mg/dL

## 2022-05-01 LAB — C-REACTIVE PROTEIN
CRP: 6.5 mg/dL — ABNORMAL HIGH (ref <1.0)
CRP: 9.6 mg/dL — ABNORMAL HIGH (ref <1.0)

## 2022-05-01 LAB — HEPARIN LEVEL (UNFRACTIONATED)
Heparin Unfractionated: 0.25 IU/mL — ABNORMAL LOW (ref 0.30–0.70)
Heparin Unfractionated: 0.88 IU/mL — ABNORMAL HIGH (ref 0.30–0.70)

## 2022-05-01 LAB — GLUCOSE, CAPILLARY
Glucose-Capillary: 131 mg/dL — ABNORMAL HIGH (ref 70–99)
Glucose-Capillary: 100 mg/dL — ABNORMAL HIGH (ref 70–99)
Glucose-Capillary: 153 mg/dL — ABNORMAL HIGH (ref 70–99)
Glucose-Capillary: 223 mg/dL — ABNORMAL HIGH (ref 70–99)

## 2022-05-01 LAB — ECHOCARDIOGRAM COMPLETE
Area-P 1/2: 2.63 cm2
Calc EF: 73.7 %
Height: 63 in
S' Lateral: 2.6 cm
Single Plane A2C EF: 70.8 %
Single Plane A4C EF: 77.4 %
Weight: 4493.86 oz

## 2022-05-01 LAB — TSH: TSH: 2.121 u[IU]/mL (ref 0.350–4.500)

## 2022-05-01 LAB — D-DIMER, QUANTITATIVE: D-Dimer, Quant: 1.05 ug/mL-FEU — ABNORMAL HIGH (ref 0.00–0.50)

## 2022-05-01 LAB — PROCALCITONIN: Procalcitonin: <0.1 ng/mL

## 2022-05-01 MED ORDER — METOPROLOL TARTRATE 50 MG PO TABS
50.0000 mg | ORAL_TABLET | Freq: Two times a day (BID) | ORAL | Status: DC
  Administered 2022-05-01 – 2022-05-05 (×8): 50 mg via ORAL
  Filled 2022-05-01 (×8): qty 1

## 2022-05-01 MED ORDER — WARFARIN SODIUM 5 MG PO TABS
5.0000 mg | ORAL_TABLET | ORAL | Status: AC
Start: 2022-05-02 — End: 2022-05-01
  Administered 2022-05-01: 5 mg via ORAL
  Filled 2022-05-01: qty 1

## 2022-05-01 MED ORDER — METHYLPREDNISOLONE SODIUM SUCC 125 MG IJ SOLR
125.0000 mg | Freq: Two times a day (BID) | INTRAMUSCULAR | Status: AC
  Administered 2022-05-01 – 2022-05-04 (×6): 125 mg via INTRAVENOUS
  Filled 2022-05-01 (×6): qty 2

## 2022-05-01 MED ORDER — PREDNISONE 20 MG PO TABS
50.0000 mg | ORAL_TABLET | Freq: Every day | ORAL | Status: DC
  Administered 2022-05-05: 50 mg via ORAL
  Filled 2022-05-01: qty 1

## 2022-05-01 MED ORDER — WARFARIN - PHARMACIST DOSING INPATIENT: Freq: Every day | Status: DC

## 2022-05-01 NOTE — Progress Notes (Signed)
Page for heparin/warfarin Indication: atrial fibrillation  Allergies  Allergen Reactions   Bactrim [Sulfamethoxazole-Trimethoprim]     Drop in BP   Morphine And Related Other (See Comments)    Unresponsive-Very bad reaction.  Can take hydrocodone.    Oysters [Shellfish Allergy] Shortness Of Breath, Itching, Swelling and Rash   Amlodipine Swelling and Other (See Comments)    Of legs   Percocet [Oxycodone-Acetaminophen] Other (See Comments)    hallucinations   Tdap [Tetanus-Diphth-Acell Pertussis] Itching, Swelling and Rash   Tetanus Toxoids Itching, Swelling and Rash   Tramadol Swelling and Other (See Comments)    Of legs   Carbamazepine Other (See Comments)   Lactose Intolerance (Gi)    Macrodantin [Nitrofurantoin] Other (See Comments)   Other Other (See Comments)   Tetanus-Diphtheria Toxoids Td Other (See Comments)   Topiramate Other (See Comments)   Diovan [Valsartan] Other (See Comments)    Cough   Diphtheria Toxoid Itching, Swelling and Rash   Macrodantin [Nitrofurantoin Macrocrystal] Other (See Comments)    unspecified    Patient Measurements: Height: 5' 3"  (160 cm) Weight: 127.4 kg (280 lb 13.9 oz) IBW/kg (Calculated) : 52.4 Heparin Dosing Weight: 84kg  Vital Signs: Temp: 98.4 F (36.9 C) (07/19 1948) Temp Source: Oral (07/19 1948) BP: 121/63 (07/19 1948) Pulse Rate: 98 (07/19 1948)  Labs: Recent Labs    04/30/22 1311 04/30/22 1421 04/30/22 1625 05/01/22 0657 05/01/22 2115  HGB 13.1  --   --   --   --   HCT 42.5  --   --   --   --   PLT 119*  --   --   --   --   LABPROT  --  20.2*  --   --  22.7*  INR  --  1.8*  --   --  2.0*  HEPARINUNFRC  --   --   --  0.25* 0.88*  CREATININE 0.81  --   --   --   --   TROPONINIHS 17  --  16  --   --      Estimated Creatinine Clearance: 72.1 mL/min (by C-G formula based on SCr of 0.81 mg/dL).   Assessment: 55 YOF presenting with generalized weakness with intermittent  chest pain, in afib, on warfarin PTA with INR 1.8 on admission (last dose 7/17). Pt started on IV heparin upon admission. Pharmacy consulted to restart coumadin 7/19. PTA dosing: 52m daily except 2.5625mMondays  INR now up slightly to 2 (at low end of therapeutic). Heparin level up to 0.88 this evening (supratherapeutic) - appears to be drawn correctly. No bleeding noted.  Discussed with Dr. KaConception Omsnd will continue heparin for now. If a.m. INR comes back >2, will contact MD about d/cing heparin.  Goal of Therapy:  Heparin level 0.3-0.7 units/ml Monitor platelets by anticoagulation protocol: Yes   Plan:  Decrease heparin to 1400 units/hr Recheck heparin level in 8hr Warfarin 25m31mow  CarSherlon HandingharmD, BCPS Please see amion for complete clinical pharmacist phone list 05/01/2022 10:52 PM

## 2022-05-01 NOTE — Progress Notes (Signed)
Patient declines CPAP. No unit in room at this time.

## 2022-05-01 NOTE — Progress Notes (Signed)
   05/01/22 0740  Assess: MEWS Score  Temp 98.5 F (36.9 C)  BP (!) 80/55  MAP (mmHg) (!) 64  Pulse Rate 90  ECG Heart Rate 84  Resp 20  Level of Consciousness Alert  SpO2 91 %  O2 Device Nasal Cannula  O2 Flow Rate (L/min) 2 L/min  Assess: MEWS Score  MEWS Temp 0  MEWS Systolic 2  MEWS Pulse 0  MEWS RR 0  MEWS LOC 0  MEWS Score 2  MEWS Score Color Yellow  Treat  Pain Scale 0-10  Pain Score 8  Pain Type Acute pain  Pain Location Knee  Pain Orientation Right  Pain Descriptors / Indicators Aching  Pain Frequency Intermittent  Pain Onset Progressive  Patients Stated Pain Goal 0  Pain Intervention(s) Medication (See eMAR)  Take Vital Signs  Increase Vital Sign Frequency  Yellow: Q 2hr X 2 then Q 4hr X 2, if remains yellow, continue Q 4hrs  Escalate  MEWS: Escalate Yellow: discuss with charge nurse/RN and consider discussing with provider and RRT  Notify: Charge Nurse/RN  Name of Charge Nurse/RN Notified Sarah RN  Date Charge Nurse/RN Notified 05/01/22  Time Charge Nurse/RN Notified 0756  Document  Patient Outcome Stabilized after interventions  Progress note created (see row info) Yes  Assess: SIRS CRITERIA  SIRS Temperature  0  SIRS Pulse 0  SIRS Respirations  0  SIRS WBC 1  SIRS Score Sum  1

## 2022-05-01 NOTE — Progress Notes (Signed)
  Echocardiogram 2D Echocardiogram has been performed.  Bobbye Charleston 05/01/2022, 12:35 PM

## 2022-05-01 NOTE — Care Management Obs Status (Signed)
[place] NOTIFICATION   Patient Details  Name: Natasha Glass MRN: [id number] Date of Birth: Feb 19, 1941   Medicare Observation Status Notification Given:  Yes    Bethena Roys, RN 05/01/2022, 4:02 PM

## 2022-05-01 NOTE — Progress Notes (Signed)
[place] for heparin Indication: atrial fibrillation  Allergies  Allergen Reactions   Bactrim [Sulfamethoxazole-Trimethoprim]     Drop in BP   Morphine And Related Other (See Comments)    Unresponsive-Very bad reaction.  Can take hydrocodone.    Oysters [Shellfish Allergy] Shortness Of Breath, Itching, Swelling and Rash   Amlodipine Swelling and Other (See Comments)    Of legs   Percocet [Oxycodone-Acetaminophen] Other (See Comments)    hallucinations   Tdap [Tetanus-Diphth-Acell Pertussis] Itching, Swelling and Rash   Tetanus Toxoids Itching, Swelling and Rash   Tramadol Swelling and Other (See Comments)    Of legs   Carbamazepine Other (See Comments)   Lactose Intolerance (Gi)    Macrodantin [Nitrofurantoin] Other (See Comments)   Other Other (See Comments)   Tetanus-Diphtheria Toxoids Td Other (See Comments)   Topiramate Other (See Comments)   Diovan [Valsartan] Other (See Comments)    Cough   Diphtheria Toxoid Itching, Swelling and Rash   Macrodantin [Nitrofurantoin Macrocrystal] Other (See Comments)    unspecified    Patient Measurements: Height: 5' 3"  (160 cm) Weight: 127.4 kg (280 lb 13.9 oz) IBW/kg (Calculated) : 52.4 Heparin Dosing Weight: 84kg  Vital Signs: Temp: 98.5 F (36.9 C) (07/19 0740) Temp Source: Oral (07/19 0740) BP: 80/55 (07/19 0740) Pulse Rate: 90 (07/19 0740)  Labs: Recent Labs    04/30/22 1311 04/30/22 1421 04/30/22 1625 05/01/22 0657  HGB 13.1  --   --   --   HCT 42.5  --   --   --   PLT 119*  --   --   --   LABPROT  --  20.2*  --   --   INR  --  1.8*  --   --   HEPARINUNFRC  --   --   --  0.25*  CREATININE 0.81  --   --   --   TROPONINIHS 17  --  16  --      Estimated Creatinine Clearance: 72.1 mL/min (by C-G formula based on SCr of 0.81 mg/dL).   Medical History: Past Medical History:  Diagnosis Date   Anemia    Arthritis    CHF (congestive heart failure) (HCC)    DVT (deep  venous thrombosis) ([place]) 01/2009   Dysrhythmia    GERD (gastroesophageal reflux disease)    Gout    History of blood transfusion    1990's   History of colonic polyps 09/2010   History of pneumonia    Hyperlipidemia    Hypertension    Lactose intolerance    Macular degeneration, dry    bilateral   Migraine    Obesity    Osteopenia    PAF (paroxysmal atrial fibrillation) (HCC)    Peripheral neuropathy    Pulmonary embolism ([place]) 01/2009   Sleep apnea    CPAP pressure 4.0-16.0   Thoracic ascending aortic aneurysm (HCC)    Type II diabetes mellitus (HCC)    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 Pt started on IV heparin for now.  Initial heparin level subtherapeutic at 0.25.  PTA dosing: 71m daily except 2.544mMondays  Goal of Therapy:  Heparin level 0.3-0.7 units/ml Monitor platelets by anticoagulation protocol: Yes   Plan:  Increase heparin to 1550 units/h Recheck heparin level in 8h  Arrie Senate, PharmD, BCPS, [facility] Clinical Pharmacist [phone] Please check AMION for all [place] numbers 05/01/2022

## 2022-05-01 NOTE — NC FL2 (Signed)
[place] LEVEL OF CARE SCREENING TOOL     IDENTIFICATION  Patient Name: Natasha Glass Birthdate: 1941/02/19 Sex: female Admission Date (Current Location): 04/30/2022  [facility] and [place] Number:  Herbalist and Address:  [facility] [place], [place] [address] [place], [place] [address]      Provider Number: [id number]  Attending Physician Name and Address:  Alma Friendly, MD  Relative Name and Phone Number:  Josph Macho [phone]) [phone]    Current Level of Care: Hospital Recommended Level of Care: [place] Prior Approval Number:    Date Approved/Denied:   PASRR Number: [id number]  Discharge Plan: SNF    Current Diagnoses: Patient Active Problem List   Diagnosis Date Noted   Atrial fibrillation with rapid ventricular response ([place]) 04/30/2022   Fall at home, initial encounter 04/30/2022   Chronic diastolic (congestive) heart failure ([place]) 04/30/2022   OSA on CPAP 04/30/2022   GERD without esophagitis 04/30/2022   Chest pain 04/30/2022   COVID-19 virus infection 04/30/2022   Chronic anticoagulation 09/10/2021   Essential hypertension 08/30/2021   Aortic atherosclerosis ([place]) 08/30/2021   Type 2 diabetes mellitus with morbid obesity ([place]) 08/30/2021   Bilateral lower extremity edema 08/30/2021   Gout 08/30/2021   Thrombocytopenia ([place]) 08/30/2021   Mixed diabetic hyperlipidemia associated with type 2 diabetes mellitus ([place]) 08/30/2021   Cellulitis 08/27/2021   Sepsis ([place]) 08/26/2021   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/2019   Acquired thrombophilia ([place]) 09/17/2019   Pain in joint of right shoulder 09/22/2018   Persistent atrial fibrillation ([place]) 08/18/2018   PAF (paroxysmal atrial fibrillation) (HCC)    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 ([place]) 01/19/2018   Acute respiratory failure with hypoxia (HCC) 04/11/2016   Acute lower UTI  04/10/2016   Generalized weakness 04/10/2016   Nausea 04/10/2016   Type 2 diabetes mellitus without complication, without long-term current use of insulin (HCC) 04/10/2016   Ascending aortic aneurysm (HCC) 05/18/2014   Ulcerative colitis ([place]) 05/07/2012   Morbid obesity ([place]) 05/07/2012   Hypertension 05/07/2012   History of pulmonary embolism 05/07/2012    Orientation RESPIRATION BLADDER Height & Weight     [place], Time, Situation, Place (WDL)  O2 (Nasal Cannula 2 liters) Continent Weight: 280 lb 13.9 oz (127.4 kg) Height:  5' 3"  (160 cm)  BEHAVIORAL SYMPTOMS/MOOD NEUROLOGICAL BOWEL NUTRITION STATUS      Continent (WDL) Diet (Please see discharge summary)  AMBULATORY STATUS COMMUNICATION OF NEEDS Skin   Extensive Assist Verbally Other (Comment) (appropriate for ethnicity,dry,flaky,ecchymosis,hand,arm,Bilateral,non-tenting)                       Personal Care Assistance Level of Assistance  Bathing, Feeding, Dressing Bathing Assistance: Limited assistance Feeding assistance: Independent (NPO) Dressing Assistance: Limited assistance     Functional Limitations Info  Sight, Hearing, Speech Sight Info: Adequate Hearing Info: Adequate Speech Info: Adequate    SPECIAL CARE FACTORS FREQUENCY  PT (By licensed PT), OT (By licensed OT)     PT Frequency: 5x min weekly OT Frequency: 5x min weekly            Contractures Contractures Info: Not present    Additional Factors Info  Code Status, Allergies, Insulin Sliding Scale, Isolation Precautions Code Status Info: FULL Allergies Info: Bactrim (sulfamethoxazole-trimethoprim),Morphine And Related,Oysters (shellfish Allergy),Amlodipine,Percocet (oxycodone-acetaminophen),Tdap (tetanus-diphth-acell Pertussis),Tetanus Toxoids,Tramadol,Carbamazepine,Lactose Intolerance (gi),Macrodantin (nitrofurantoin),Other,Tetanus-diphtheria Toxoids Td,Topiramate,Diovan (valsartan),Diphtheria Toxoid,Macrodantin (nitrofurantoin Macrocrystal)    Insulin Sliding Scale  Info: insulin aspart (novoLOG) injection 0-15 Units 3 times daily before meals and bedtime Isolation Precautions Info: Covid + added 04/30/2022     Current Medications (05/01/2022):  This is the current hospital active medication list Current Facility-Administered Medications  Medication Dose Route Frequency Provider Last Rate Last Admin   acetaminophen (TYLENOL) tablet 650 mg  650 mg Oral Q6H PRN Vernelle Emerald, MD   650 mg at 05/01/22 2355   Or   acetaminophen (TYLENOL) suppository 650 mg  650 mg Rectal Q6H PRN Shalhoub, Sherryll Burger, MD       albuterol (VENTOLIN HFA) 108 (90 Base) MCG/ACT inhaler 2 puff  2 puff Inhalation Q6H Shalhoub, Sherryll Burger, MD   2 puff at 05/01/22 1234   albuterol (VENTOLIN HFA) 108 (90 Base) MCG/ACT inhaler 2 puff  2 puff Inhalation Q4H PRN Shalhoub, Sherryll Burger, MD       allopurinol (ZYLOPRIM) tablet 150 mg  150 mg Oral QPM Shalhoub, Sherryll Burger, MD   150 mg at 04/30/22 2130   ascorbic acid (VITAMIN C) tablet 500 mg  500 mg Oral Daily Shalhoub, Sherryll Burger, MD   500 mg at 05/01/22 1007   diltiazem (CARDIZEM) 125 mg in dextrose 5% 125 mL (1 mg/mL) infusion  5-15 mg/hr Intravenous Continuous Shalhoub, Sherryll Burger, MD 5 mL/hr at 05/01/22 0743 5 mg/hr at 05/01/22 0743   dofetilide (TIKOSYN) capsule 250 mcg  250 mcg Oral BID Vernelle Emerald, MD   250 mcg at 05/01/22 0744   furosemide (LASIX) tablet 40 mg  40 mg Oral Daily Margie Billet, NP       guaiFENesin-dextromethorphan (ROBITUSSIN DM) 100-10 MG/5ML syrup 10 mL  10 mL Oral Q4H PRN Shalhoub, Sherryll Burger, MD       heparin ADULT infusion 100 units/mL (25000 units/235m)  1,550 Units/hr Intravenous Continuous BEinar Grad RPH 15.5 mL/hr at 05/01/22 1008 1,550 Units/hr at 05/01/22 1008   hydrALAZINE (APRESOLINE) injection 10 mg  10 mg Intravenous Q6H PRN Shalhoub, GSherryll Burger MD       insulin aspart (novoLOG) injection 0-15 Units  0-15 Units Subcutaneous TID AC & HS Shalhoub, GSherryll Burger MD   2 Units at 05/01/22  1008   ipratropium-albuterol (DUONEB) 0.5-2.5 (3) MG/3ML nebulizer solution 3 mL  3 mL Nebulization Once Shalhoub, GSherryll Burger MD       metoprolol tartrate (LOPRESSOR) tablet 50 mg  50 mg Oral BID ZMargie Billet NP       molnupiravir EUA (LAGEVRIO) capsule 800 mg  4 capsule Oral BID SVernelle Emerald MD   800 mg at 05/01/22 1055   ondansetron (ZOFRAN) tablet 4 mg  4 mg Oral Q6H PRN Shalhoub, GSherryll Burger MD       Or   ondansetron (ZOFRAN) injection 4 mg  4 mg Intravenous Q6H PRN Shalhoub, GSherryll Burger MD       pantoprazole (PROTONIX) EC tablet 40 mg  40 mg Oral Daily Shalhoub, GSherryll Burger MD   40 mg at 05/01/22 1007   polyethylene glycol (MIRALAX / GLYCOLAX) packet 17 g  17 g Oral Daily PRN Shalhoub, GSherryll Burger MD       rosuvastatin (CRESTOR) tablet 5 mg  5 mg Oral Daily Shalhoub, GSherryll Burger MD   5 mg at 05/01/22 1007   sodium chloride flush (NS) 0.9 % injection 3 mL  3 mL Intravenous Q12H Shalhoub, GSherryll Burger MD   3 mL at 05/01/22 1013   zinc sulfate capsule 220 mg  220 mg Oral Daily Shalhoub,  Sherryll Burger, MD   220 mg at 05/01/22 1007     Discharge Medications: Please see discharge summary for a list of discharge medications.  Relevant Imaging Results:  Relevant Lab Results:   Additional Information [phone], both covid Vaccines 2 boosters  Milas Gain, LCSWA

## 2022-05-01 NOTE — Consult Note (Addendum)
Cardiology Consultation:   Patient ID: Natasha Glass MRN: [id number]; DOB: June 05, 1941  Admit date: 04/30/2022 Date of Consult: 05/01/2022  PCP:  Natasha Huddle, MD   [facility] Providers Cardiologist:  None  Electrophysiologist:  Natasha Grayer, MD  {   Patient Profile:   Natasha Glass is a 81 y.o. female with a hx of hypertension, type 2 diabetes, diastolic heart failure, history of DVT/PE 2010, OSA on CPAP, persistent A-fib, GERD, ascending aortic aneurysm, who is being seen 05/01/2022 for the evaluation of atrial fibrillation with RVR at the request of Dr. Cyd Glass.  History of Present Illness:   [name] with above PMH who is currently admitted after a mechanical fall at home on 04/29/2022 due to leg weakness.  She had several days onset of nonproductive cough, generalized malaise, weakness, and chest pain.  She was found to be positive with COVID-19.  She was noted in A-fib with RVR at ED with heart rate up to 130s.  She was continued on her Tikosyn, started on diltiazem infusion for rate control, cardiology is subsequently consulted for further input.   Diagnostic work-up here revealed elevated AST 54.  BNP 150.  CBC with thrombocytopenia 119k.  INR 1.8.  Urinalysis grossly unremarkable for infection.  CT right knee without fracture.Chest x-ray revealed stable chronic central airway and interstitial thickening, no acute cardiopulmonary process.  She states she has been cough since Sunday, feeling weak. She states she fell because her legs was "jelly like". She felt lightheaded which reminds her about her A fib. She recalls some chest discomfort intermittently, underneath her bilateral breasts, when she lay flat, not triggered by exertion. She takes lasix for removing excessive amount of fluid. She denied fever, chills.   Historically patient follows atrial fibrillation clinic for persistent atrial fibrillation.  She was initially diagnosed with atrial fibrillation 2019 by PCP, was  managed by metoprolol at the time.  Started on anticoagulation with Xarelto 20 mg daily for CHA2DS2-VASc score of 5.  Underwent successful cardioversion on 05/07/2018.  Anticoagulation switched to Coumadin later due to cost.  She had recurrence of atrial fibrillation RVR causing her to feel very weak, hospitalized 08/18/2018 for Tikosyn loading, QTc remained stable, she converted with medication without DCCV.  She had remained in sinus rhythm and doing well until 08/2021 where she was hospitalized for sepsis secondary to UTI and lower extremity cellulitis, she had episodes of A-fib RVR.  She was seen by cardiology inpatient, Toprol XL was changed to Lopressor with dose increased to 100 mg twice daily, and added PRN diltiazem for rate control.  She was last seen by A-fib clinic 11/13/2021, noted with heart rate at 44bpm, in sinus rhythm, feeling sluggish.  She was advised to continue Tikosyn to 50 mcg twice daily and reduce metoprolol to 50 mg twice daily.  She remains on Coumadin for anticoagulation.  Echo from 06/02/2017 with LVEF 55 to 60%, no regional wall motion abnormality, normal diastolic function, mild LAE and RAE.  NM stress Myoview from 03/18/2018 was normal.   Past Medical History:  Diagnosis Date   Anemia    Arthritis    CHF (congestive heart failure) (HCC)    DVT (deep venous thrombosis) ([place]) 01/2009   Dysrhythmia    GERD (gastroesophageal reflux disease)    Gout    History of blood transfusion    1990's   History of colonic polyps 09/2010   History of pneumonia    Hyperlipidemia    Hypertension    Lactose  intolerance    Macular degeneration, dry    bilateral   Migraine    Obesity    Osteopenia    PAF (paroxysmal atrial fibrillation) (HCC)    Peripheral neuropathy    Pulmonary embolism ([place]) 01/2009   Sleep apnea    CPAP pressure 4.0-16.0   Thoracic ascending aortic aneurysm (HCC)    Type II diabetes mellitus ([place])    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)     Urinary incontinence     Past Surgical History:  Procedure Laterality Date   [place] N/A 05/07/2018   Procedure: CARDIOVERSION;  Surgeon: Natasha Hector, MD;  Location: [facility];  Service: Cardiovascular;  Laterality: N/A;   COLONOSCOPY W/ BIOPSIES AND POLYPECTOMY  2005; 2011; 2012   COLONOSCOPY WITH PROPOFOL N/A 09/10/2016   Procedure: COLONOSCOPY WITH PROPOFOL;  Surgeon: Natasha Fair, MD;  Location: [facility];  Service: Endoscopy;  Laterality: N/A;   JOINT REPLACEMENT     KNEE ARTHROSCOPY Left ~ [place] Left 2011   TOTAL KNEE ARTHROPLASTY Bilateral 2005-2012   left-right     Home Medications:  Prior to Admission medications   Medication Sig Start Date End Date Taking? Authorizing Provider  acetaminophen (TYLENOL) 500 MG tablet Take 500 mg by mouth at bedtime.   Yes [provider]  allopurinol (ZYLOPRIM) 300 MG tablet Take 0.5 tablets (150 mg total) by mouth every evening. 09/14/21  Yes Natasha Fee, NP  Cholecalciferol (VITAMIN D3) 25 MCG (1000 UT) CAPS Take 3 capsules by mouth daily in the afternoon.   Yes [provider]  clobetasol cream (TEMOVATE) 8.93 % Apply 1 application topically as needed. 09/14/21  Yes Natasha Fee, NP  dofetilide (TIKOSYN) 250 MCG capsule Take 1 capsule (250 mcg total) by mouth 2 (two) times daily. 09/12/21  Yes Natasha Fee, NP  furosemide (LASIX) 40 MG tablet Take one tablet by mouth daily, every other day add extra 1/2 tablet daily 11/13/21  Yes Natasha Needs, NP  losartan (COZAAR) 25 MG tablet Take 1 tablet (25 mg total) by mouth daily. 09/14/21  Yes Natasha Fee, NP  magnesium oxide (MAG-OX) 400 MG tablet Take 1 tablet (400 mg total) by mouth at bedtime. 09/12/21  Yes Natasha Fee, NP  metFORMIN (GLUCOPHAGE) 500 MG tablet Take 1 tablet (500 mg total) by mouth 2 (two)  times daily with a meal. 09/14/21  Yes Natasha Fee, NP  metoprolol tartrate (LOPRESSOR) 25 MG tablet Take 1 tablet (25 mg total) by mouth 2 (two) times daily. 03/01/22  Yes Natasha Needs, NP  Multiple Vitamins-Minerals (PRESERVISION AREDS 2) CAPS Take 2 capsules by mouth daily.   Yes [provider]  Naphazoline-Hydroxyprop Mcell 0.03-0.5 % SOLN Place 1 drop into both eyes 2 (two) times daily.   Yes [provider]  ondansetron (ZOFRAN) 8 MG tablet Take 1 tablet (8 mg total) by mouth every 6 (six) hours as needed for nausea or vomiting. 09/14/21  Yes Natasha Fee, NP  potassium chloride SA (KLOR-CON M) 20 MEQ tablet Take 1.5 tablets (30 mEq total) by mouth daily. 09/14/21  Yes Natasha Fee, NP  rosuvastatin (CRESTOR) 5 MG tablet Take 5 mg by mouth daily. 03/26/22  Yes [provider]  vitamin B-12 (CYANOCOBALAMIN) 500 MCG tablet Take 500 mcg by mouth daily.   Yes [provider]  warfarin (COUMADIN) 5 MG tablet Take 1 tablet (5 mg total) by mouth daily. 5 mg daily except for Monday take 1/2 tablet 09/14/21  Yes Natasha Fee, NP  diltiazem (CARDIZEM) 30 MG tablet Take 1 tablet (30 mg total) by mouth every 6 (six) hours as needed (For atial fibrillation with HR greater than 100 bpm.). Patient not taking: Reported on 04/30/2022 09/14/21   Natasha Fee, NP  Lancet Devices [facility] DELICA PLUS LANCING) [place]  07/19/21   [provider]  Lancets [facility] DELICA PLUS [id number]) [place] 1 each topically 3 (three) times daily. 07/19/21   [provider]  Donald Siva test strip  10/03/21   [provider]    Inpatient Medications: Scheduled Meds:  albuterol  2 puff Inhalation Q6H   allopurinol  150 mg Oral QPM   vitamin C  500 mg Oral Daily   dofetilide  250 mcg Oral BID   furosemide  40 mg Oral Daily   insulin aspart  0-15 Units Subcutaneous TID AC & HS   ipratropium-albuterol  3 mL Nebulization Once   metoprolol  tartrate  50 mg Oral BID   molnupiravir EUA  4 capsule Oral BID   pantoprazole  40 mg Oral Daily   rosuvastatin  5 mg Oral Daily   sodium chloride flush  3 mL Intravenous Q12H   zinc sulfate  220 mg Oral Daily   Continuous Infusions:  diltiazem (CARDIZEM) infusion 5 mg/hr (05/01/22 0743)   heparin 1,550 Units/hr (05/01/22 1008)   PRN Meds: acetaminophen **OR** acetaminophen, albuterol, guaiFENesin-dextromethorphan, hydrALAZINE, ondansetron **OR** ondansetron (ZOFRAN) IV, polyethylene glycol  Allergies:    Allergies  Allergen Reactions   Bactrim [Sulfamethoxazole-Trimethoprim]     Drop in BP   Morphine And Related Other (See Comments)    Unresponsive-Very bad reaction.  Can take hydrocodone.    Oysters [Shellfish Allergy] Shortness Of Breath, Itching, Swelling and Rash   Amlodipine Swelling and Other (See Comments)    Of legs   Percocet [Oxycodone-Acetaminophen] Other (See Comments)    hallucinations   Tdap [Tetanus-Diphth-Acell Pertussis] Itching, Swelling and Rash   Tetanus Toxoids Itching, Swelling and Rash   Tramadol Swelling and Other (See Comments)    Of legs   Carbamazepine Other (See Comments)   Lactose Intolerance (Gi)    Macrodantin [Nitrofurantoin] Other (See Comments)   Other Other (See Comments)   Tetanus-Diphtheria Toxoids Td Other (See Comments)   Topiramate Other (See Comments)   Diovan [Valsartan] Other (See Comments)    Cough   Diphtheria Toxoid Itching, Swelling and Rash   Macrodantin [Nitrofurantoin Macrocrystal] Other (See Comments)    unspecified    Social History:   Social History   Socioeconomic History   Marital status: Widowed    Spouse name: Not on file   Number of children: Not on file   Years of education: Not on file   Highest education level: Not on file  Occupational History   Not on file  Tobacco Use   Smoking status: Never   Smokeless tobacco: Never  Vaping Use   Vaping Use: Never used  Substance and Sexual Activity    Alcohol use: No   Drug use: No   Sexual activity: Not Currently  Other Topics Concern   Not on file  Social History Narrative   Not on file   Social Determinants of Health   Financial Resource Strain: Not on file  Food Insecurity: Not on file  Transportation Glass: Not on file  Physical Activity: Not on file  Stress: Not on file  Social Connections: Not on file  Intimate Partner Violence: Not on file    Family History:    Family History  Problem Relation Age of Onset   Colon cancer Mother    Lung cancer Mother    Heart disease Father      ROS:  Constitutional: see HPI  Eyes: Denied vision change or loss Ears/Nose/Mouth/Throat: Denied ear ache, sore throat, sinus pain Cardiovascular: see HPI  Respiratory: see HPI  Gastrointestinal: Denied nausea, vomiting, abdominal pain, diarrhea Genital/Urinary: Denied dysuria, hematuria, urinary frequency/urgency Musculoskeletal: weakness  Skin: Denied rash, wound Neuro: Denied headache, dizziness, syncope Psych: Denied history of depression/anxiety  Endocrine: history of diabetes   Physical Exam/Data:   Vitals:   05/01/22 0615 05/01/22 0740 05/01/22 0742 05/01/22 1207  BP: 102/68 (!) 80/55 (!) 80/55 111/70  Pulse: 90 90 90 77  Resp: 17 20 20  (!) 22  Temp: 99.7 F (37.6 C) 98.5 F (36.9 C) 98.5 F (36.9 C) 97.6 F (36.4 C)  TempSrc: Oral Oral  Oral  SpO2: 94% 91%  92%  Weight: 127.4 kg     Height: 5' 3"  (1.6 m)       Intake/Output Summary (Last 24 hours) at 05/01/2022 1405 Last data filed at 05/01/2022 0615 Gross per 24 hour  Intake 247 ml  Output 1000 ml  Net -753 ml      05/01/2022    6:15 AM 04/30/2022    6:10 PM 11/13/2021    1:25 PM  Last 3 Weights  Weight (lbs) 280 lb 13.9 oz 279 lb 283 lb 12.8 oz  Weight (kg) 127.4 kg 126.554 kg 128.731 kg     Body mass index is 49.75 kg/m.   Vitals:  Vitals:   05/01/22 0742 05/01/22 1207  BP: (!) 80/55 111/70  Pulse: 90 77  Resp: 20 (!) 22  Temp: 98.5 F (36.9  C) 97.6 F (36.4 C)  SpO2:  92%   General Appearance: In no apparent distress, laying in bed HEENT: Normocephalic, atraumatic. Neck: no JVD Cardiovascular: Irregularly irregular, normal S1-S2,  no murmur/rub/gallop Respiratory: Resting breathing unlabored, lungs sounds with rales scattered, on 2LNC, speaks full sentence  Gastrointestinal: Bowel sounds positive, abdomen soft, non-tender  Extremities: Able to move all extremities in bed without difficulty, chronic non-pitting edema of BLE with erythema and thickened skin Musculoskeletal: Normal muscle bulk and tone Skin: Intact, warm, dry.  Neurologic: Alert, oriented to person, place and time. Fluent speech, no facial droop, no cognitive deficit Psychiatric: Normal affect. Mood is appropriate.    EKG:  The EKG was personally reviewed and demonstrates:  A fib RVR 115 from 04/30/22 , Qtc manual 443 msec  Telemetry:  Telemetry was personally reviewed and demonstrates:  A fib with VR 90-100s   Relevant CV Studies:  Echocardiogram from 06/02/2017:  - Left ventricle: The cavity size was normal. Systolic function was    normal. The estimated ejection fraction was in the range of 55%    to 60%. Wall motion was normal; there were no regional wall    motion abnormalities. Left ventricular diastolic function    parameters were normal.  - Mitral valve: Calcified annulus.  - Left atrium: The atrium was mildly dilated.  - Right atrium: The atrium was mildly dilated.   Laboratory Data:  High Sensitivity Troponin:   Recent Labs  Lab 04/30/22 1311 04/30/22 1625  TROPONINIHS 17 16     Chemistry Recent Labs  Lab 04/30/22  1311  NA 136  K 4.1  CL 100  CO2 24  GLUCOSE 137*  BUN 19  CREATININE 0.81  CALCIUM 9.4  GFRNONAA >60  ANIONGAP 12    Recent Labs  Lab 04/30/22 1311  PROT 7.5  ALBUMIN 3.6  AST 54*  ALT 17  ALKPHOS 50  BILITOT 0.6   Lipids No results for input(s): "CHOL", "TRIG", "HDL", "LABVLDL", "LDLCALC", "CHOLHDL"  in the last 168 hours.  Hematology Recent Labs  Lab 04/30/22 1311  WBC 7.9  RBC 4.73  HGB 13.1  HCT 42.5  MCV 89.9  MCH 27.7  MCHC 30.8  RDW 17.2*  PLT 119*   Thyroid No results for input(s): "TSH", "FREET4" in the last 168 hours.  BNP Recent Labs  Lab 04/30/22 1311  BNP 150.1*    DDimer  Recent Labs  Lab 04/30/22 2255 05/01/22 0657  DDIMER 1.16* 1.05*     Radiology/Studies:  ECHOCARDIOGRAM COMPLETE  Result Date: 05/01/2022    ECHOCARDIOGRAM REPORT   Patient Name:   Natasha Glass Date of Exam: 05/01/2022 Medical Rec #:  [id number]      Height:       63.0 in Accession #:    [id number]     Weight:       280.9 lb Date of Birth:  01/06/1941     BSA:          2.234 m Patient Age:    25 years       BP:           102/68 mmHg Patient Gender: F              HR:           80 bpm. Exam Location:  Inpatient Procedure: 2D Echo, Cardiac Doppler and Color Doppler Indications:    I48.91* Unspeicified atrial fibrillation  History:        Patient has prior history of Echocardiogram examinations, most                 recent 06/02/2017. Abnormal ECG, Arrythmias:Atrial Fibrillation,                 Signs/Symptoms:Chest Pain; Risk Factors:Sleep Apnea, Diabetes                 and Hypertension.  Sonographer:    Roseanna Rainbow RDCS Referring Phys: Sherryll Burger [facility]  Sonographer Comments: Patient is morbidly obese. Image acquisition challenging due to patient body habitus. IMPRESSIONS  1. Left ventricular ejection fraction, by estimation, is 70 to 75%. The left ventricle has hyperdynamic function. The left ventricle has no regional wall motion abnormalities. Left ventricular diastolic parameters are indeterminate.  2. Right ventricular systolic function is normal. The right ventricular size is normal. Tricuspid regurgitation signal is inadequate for assessing PA pressure.  3. Left atrial size was mildly dilated.  4. The mitral valve is abnormal. Trivial mitral valve regurgitation. No evidence of mitral stenosis.  Severe mitral annular calcification.  5. The aortic valve is tricuspid. There is mild calcification of the aortic valve. There is mild thickening of the aortic valve. Aortic valve regurgitation is mild. Aortic valve sclerosis/calcification is present, without any evidence of aortic stenosis.  6. Aortic dilatation noted. There is moderate dilatation of the ascending aorta, measuring 46 mm.  7. The inferior vena cava is dilated in size with >50% respiratory variability, suggesting right atrial pressure of 8 mmHg. Comparison(s): No significant change from prior study. FINDINGS  Left Ventricle: Left ventricular  ejection fraction, by estimation, is 70 to 75%. The left ventricle has hyperdynamic function. The left ventricle has no regional wall motion abnormalities. The left ventricular internal cavity size was normal in size. There is no left ventricular hypertrophy. Left ventricular diastolic parameters are indeterminate. Right Ventricle: The right ventricular size is normal. No increase in right ventricular wall thickness. Right ventricular systolic function is normal. Tricuspid regurgitation signal is inadequate for assessing PA pressure. Left Atrium: Left atrial size was mildly dilated. Right Atrium: Right atrial size was normal in size. Pericardium: There is no evidence of pericardial effusion. Mitral Valve: The mitral valve is abnormal. There is mild thickening of the mitral valve leaflet(s). There is mild calcification of the mitral valve leaflet(s). Severe mitral annular calcification. Trivial mitral valve regurgitation. No evidence of mitral valve stenosis. Tricuspid Valve: The tricuspid valve is normal in structure. Tricuspid valve regurgitation is trivial. Aortic Valve: The aortic valve is tricuspid. There is mild calcification of the aortic valve. There is mild thickening of the aortic valve. Aortic valve regurgitation is mild. Aortic valve sclerosis/calcification is present, without any evidence of aortic  stenosis. Pulmonic Valve: The pulmonic valve was not well visualized. Pulmonic valve regurgitation is trivial. Aorta: Aortic dilatation noted. There is moderate dilatation of the ascending aorta, measuring 46 mm. Venous: The inferior vena cava is dilated in size with greater than 50% respiratory variability, suggesting right atrial pressure of 8 mmHg. IAS/Shunts: The atrial septum is grossly normal.  LEFT VENTRICLE PLAX 2D LVIDd:         4.20 cm LVIDs:         2.60 cm LV PW:         1.20 cm LV IVS:        0.87 cm LVOT diam:     2.20 cm LV SV:         76 LV SV Index:   34 LVOT Area:     3.80 cm  LV Volumes (MOD) LV vol d, MOD A2C: 54.2 ml LV vol d, MOD A4C: 54.4 ml LV vol s, MOD A2C: 15.8 ml LV vol s, MOD A4C: 12.3 ml LV SV MOD A2C:     38.4 ml LV SV MOD A4C:     54.4 ml LV SV MOD BP:      41.5 ml RIGHT VENTRICLE             IVC RV S prime:     14.30 cm/s  IVC diam: 2.60 cm TAPSE (M-mode): 1.7 cm LEFT ATRIUM             Index        RIGHT ATRIUM           Index LA diam:        3.30 cm 1.48 cm/m   RA Area:     13.00 cm LA Vol (A2C):   79.1 ml 35.41 ml/m  RA Volume:   26.20 ml  11.73 ml/m LA Vol (A4C):   65.0 ml 29.10 ml/m LA Biplane Vol: 74.3 ml 33.26 ml/m  AORTIC VALVE             PULMONIC VALVE LVOT Vmax:   125.50 cm/s PR End Diast Vel: 1.30 msec LVOT Vmean:  77.350 cm/s LVOT VTI:    0.200 m  AORTA Ao Root diam: 3.20 cm Ao Asc diam:  4.55 cm MITRAL VALVE MV Area (PHT): 2.63 cm     SHUNTS MV Decel Time: 289 msec     Systemic VTI:  0.20 m MV E velocity: 106.00 cm/s  Systemic Diam: 2.20 cm Gwyndolyn Kaufman MD Electronically signed by Gwyndolyn Kaufman MD Signature Date/Time: 05/01/2022/1:55:13 PM    Final    CT Knee Right Wo Contrast  Result Date: 04/30/2022 CLINICAL DATA:  Right knee trauma.  Occult fracture suspected. EXAM: CT OF THE RIGHT KNEE WITHOUT CONTRAST TECHNIQUE: Multidetector CT imaging of the right knee was performed according to the standard protocol. Multiplanar CT image reconstructions were  also generated. RADIATION DOSE REDUCTION: This exam was performed according to the departmental dose-optimization program which includes automated exposure control, adjustment of the mA and/or kV according to patient size and/or use of iterative reconstruction technique. 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 Within this limitation, no definite perihardware lucency is seen to indicate hardware failure or loosening. No definite acute fracture is identified. There is diffuse decreased bone mineralization. Ligaments Suboptimally assessed by CT. Muscles and Tendons Mild infiltration of visualized portions of the quadriceps and soleus and medial head of the gastrocnemius lateral head of the gastrocnemius muscles. No gross tendon tear is visualized. Soft tissues Streak artifact limits evaluation for a joint effusion however no large joint effusion is seen. There is mild-to-moderate edema and swelling within the subcutaneous fat of the visualized proximal calf. IMPRESSION: 1. Within the limitations of metallic streak artifact from total right knee arthroplasty hardware, no acute fracture is seen. 2. No evidence of hardware failure. Electronically Signed   By: Yvonne Kendall M.D.   On: 04/30/2022 15:27   DG Chest 2 View  Result Date: 04/30/2022 CLINICAL DATA:  Patient fell last night. Intermittent atrial fibrillation with shortness of breath and cough for several days. EXAM: CHEST - 2 VIEW COMPARISON:  Radiographs 08/26/2021.  CT 03/21/2021. FINDINGS: The heart size is stable at the upper limits of normal. The mediastinal contours are normal. Chronic central airway and interstitial thickening appears unchanged. No evidence of edema, confluent airspace opacity, pleural effusion or pneumothorax. No acute osseous findings are evident. There are degenerative changes  throughout the spine. IMPRESSION: Stable chronic central airway and interstitial thickening. No acute cardiopulmonary process. Electronically Signed   By: Richardean Sale M.D.   On: 04/30/2022 13:22   DG Knee Complete 4 Views Right  Result Date: 04/30/2022 CLINICAL DATA:  Patient fell last night.  Knee pain. EXAM: RIGHT KNEE - COMPLETE 4+ VIEW COMPARISON:  None Available. FINDINGS: Status post total knee arthroplasty without evidence of hardware loosening, acute fracture or dislocation. The bones are demineralized. There is a small knee joint effusion. No focal soft tissue abnormalities or unexpected foreign bodies are identified. IMPRESSION: No evidence of acute fracture or dislocation post total knee arthroplasty. Small nonspecific knee joint effusion. Electronically Signed   By: Richardean Sale M.D.   On: 04/30/2022 13:21     Assessment and Plan:   Persistent A-fib with RVR -Initial diagnosis dating back to 2019, has been well controlled since 08/2018 Tikosyn load, historically maintained on metoprolol/Tikosyn/Coumadin -Now presents with COVID-19 infection, mechanical fall, likely infection mediated A fib RVR , will check TSH - Echo is pending report  - Rate control improving with diltiazem drip, BP become low with cardizem gtt, will wean off  - will increase PO metoprolol from 53m BID to 540mBID, continue Tikosyn250 mcg BID, maintain adequate hydration, keep Mag >2 and K > 4 -On heparin infusion for anticoagulation, may transition back to Coumadin  once stable, INR subtherapeutic, probably will not need cardioversion  Chest discomfort - atypical, occurs when lying down at times,  possible volume related - may consider ischemic evaluation if persistent   History of HTN Chronic diastolic heart failure - on Lasix for leg edema and "extra fluid " per patient report - 2018 Echo with LVEF preserved  - clinically without overt volume overload today - may hold lasix and losartanuntil BP  recovers  Acute hypoxic respiratory failure COVID-19 infection Mechanical fall Type 2 diabetes OSA on CPAP -Per primary team   Risk Assessment/Risk Scores:   New York Heart Association (NYHA) Functional Class NYHA Class I  CHA2DS2-VASc Score = 6  This indicates a 9.7% annual risk of stroke. The patient's score is based upon: CHF History: 1 HTN History: 1 Diabetes History: 1 Stroke History: 0 Vascular Disease History: 0 Age Score: 2 Gender Score: 1  For questions or updates, please contact [place] Please consult [email or web address] for contact info under   Signed, Margie Billet, NP  05/01/2022 2:05 PM' As above, patient seen and examined.  Briefly she is an 81 year old female with past medical history of paroxysmal atrial fibrillation, diabetes mellitus, hypertension, chronic diastolic congestive heart failure, prior pulmonary embolus, obstructive sleep apnea, ascending aortic aneurysm admitted with COVID for evaluation of atrial fibrillation with rapid ventricular response.  Echocardiogram this admission shows normal LV function, mild left atrial enlargement, mild aortic insufficiency and moderately dilated ascending aorta at 46 mm.  Over the 2 weeks preceding admission patient noticed intermittent weakness and fatigue.  She has chronic dyspnea which is unchanged.  Occasional brief palpitations.  Also with pain under her left breast that increases with lying flat.  She then developed a productive cough.  She fell injuring her leg and presented to the emergency room.  Found to be COVID-positive.  Also noted to have atrial fibrillation with rapid ventricular response and cardiology asked to evaluate.  Note beta-blocker was decreased in January due to bradycardia.  Chest x-ray with no acute disease.  INR on admission 1.8.  COVID-positive.  Electrocardiogram shows atrial fibrillation with rapid ventricular response and left axis deviation.  Creatinine 0.81, hemoglobin 13.1, BNP 150.  1  paroxysmal atrial fibrillation-patient has developed recurrent atrial fibrillation.  She has done well on Tikosyn in the past which has helped her maintain sinus rhythm.  Atrial fibrillation at present is likely caused by her COVID infection.  Would favor rate control until she recovers from COVID infection and then can proceed with cardioversion if atrial fibrillation persists.  Continue anticoagulation with Coumadin.  Note her INR was 1.8 at time of admission and therefore if we cannot control her heart rate and she requires cardioversion she would need transesophageal echocardiogram prior to proceeding.  Continue home dose of Tikosyn.  Will increase metoprolol back to 50 mg twice daily and wean Cardizem as tolerated.  Note her echocardiogram shows normal LV function.  2 COVID infection-managed by primary service.  3 chronic diastolic congestive heart failure-continue preadmission dose of Lasix.  4 thoracic aortic aneurysm-noted to be 4.4 cm on last CTA June 2022.  Will need repeat following discharge.  Kirk Ruths, MD

## 2022-05-01 NOTE — Plan of Care (Signed)

## 2022-05-01 NOTE — TOC Initial Note (Signed)
Transition of Care [facility]) - Initial/Assessment Note    Patient Details  Name: Natasha Glass MRN: [id number] Date of Birth: Mar 01, 1941  Transition [facility]) CM/SW Contact:    Milas Gain, [place] Phone Number: 05/01/2022, 2:19 PM  Clinical Narrative:                  CSW received consult for possible SNF placement at time of discharge. CSW spoke with patient regarding PT recommendation of SNF placement at time of discharge. Patient reports she is from home alone. Patient expressed understanding of PT recommendation and is agreeable to SNF placement at time of discharge. Patient reports agreeable to SNF . Patient informed CSW if she works better with PT by the time she is medically ready to go to SNF then she may decide to go home with home health services but for now plan is for SNF.Patient gave CSW permission to fax out initial referral near the [facility] area.CSW discussed insurance authorization process with patient. Patient reports she  has received the COVID vaccines as well as two boosters. No further questions reported at this time. CSW to continue to follow and assist with discharge planning needs.   Expected Discharge Plan: Skilled Nursing Facility Barriers to Discharge: Continued Medical Work up   Patient Goals and CMS Choice Patient states their goals for this hospitalization and ongoing recovery are:: SNF CMS Medicare.gov Compare Post Acute Care list provided to:: Patient Choice offered to / list presented to : Patient  Expected Discharge Plan and Services Expected Discharge Plan: [place] In-house Referral: Clinical Social Work     Living arrangements for the past 2 months: [place]                                      Prior Living Arrangements/Services Living arrangements for the past 2 months: Single Family Home Lives with:: [place] Patient language and need for interpreter reviewed:: Yes Do you feel safe  going back to the place where you live?: No   SNF  Need for Family Participation in Patient Care: Yes (Comment) Care giver support system in place?: Yes (comment)   Criminal Activity/Legal Involvement Pertinent to Current Situation/Hospitalization: No - Comment as needed  Activities of Daily Living Home Assistive Devices/Equipment: Walker (specify type) (rollator) ADL Screening (condition at time of admission) Patient's cognitive ability adequate to safely complete daily activities?: Yes Is the patient deaf or have difficulty hearing?: No Does the patient have difficulty seeing, even when wearing glasses/contacts?: No Does the patient have difficulty concentrating, remembering, or making decisions?: No Patient able to express need for assistance with ADLs?: Yes Does the patient have difficulty dressing or bathing?: No Independently performs ADLs?: Yes (appropriate for developmental age) Does the patient have difficulty walking or climbing stairs?: Yes Weakness of Legs: Both Weakness of Arms/Hands: None  Permission Sought/Granted Permission sought to share information with : Case Manager, Family Supports, Chartered certified accountant granted to share information with : Yes, Verbal Permission Granted  Share Information with NAME: Josph Macho  Permission granted to share info w AGENCY: SNF  Permission granted to share info w Relationship: son  Permission granted to share info w Contact Information: Josph Macho [phone]  Emotional Assessment   Attitude/Demeanor/Rapport: Gracious Affect (typically observed): Calm Orientation: : Oriented to [place], Oriented to Place, Oriented to  Time, Oriented to Situation Alcohol / Substance Use: Not Applicable Psych Involvement:  No (comment)  Admission diagnosis:  Atrial fibrillation with rapid ventricular response ([place]) [I48.91] Near syncope [R55] Atrial fibrillation with RVR (HCC) [I48.91] Injury of right knee, initial encounter  [S89.91XA] Patient Active Problem List   Diagnosis Date Noted   Atrial fibrillation with rapid ventricular response ([place]) 04/30/2022   Fall at home, initial encounter 04/30/2022   Chronic diastolic (congestive) heart failure ([place]) 04/30/2022   OSA on CPAP 04/30/2022   GERD without esophagitis 04/30/2022   Chest pain 04/30/2022   COVID-19 virus infection 04/30/2022   Chronic anticoagulation 09/10/2021   Essential hypertension 08/30/2021   Aortic atherosclerosis ([place]) 08/30/2021   Type 2 diabetes mellitus with morbid obesity ([place]) 08/30/2021   Bilateral lower extremity edema 08/30/2021   Gout 08/30/2021   Thrombocytopenia ([place]) 08/30/2021   Mixed diabetic hyperlipidemia associated with type 2 diabetes mellitus ([place]) 08/30/2021   Cellulitis 08/27/2021   Sepsis ([place]) 08/26/2021   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/2019   Acquired thrombophilia ([place]) 09/17/2019   Pain in joint of right shoulder 09/22/2018   Persistent atrial fibrillation ([place]) 08/18/2018   PAF (paroxysmal atrial fibrillation) (HCC)    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 ([place]) 01/19/2018   Acute respiratory failure with hypoxia ([place]) 04/11/2016   Acute lower UTI 04/10/2016   Generalized weakness 04/10/2016   Nausea 04/10/2016   Type 2 diabetes mellitus without complication, without long-term current use of insulin ([place]) 04/10/2016   Ascending aortic aneurysm ([place]) 05/18/2014   Ulcerative colitis ([place]) 05/07/2012   Morbid obesity ([place]) 05/07/2012   Hypertension 05/07/2012   History of pulmonary embolism 05/07/2012   PCP:  Josetta Huddle, MD Pharmacy:   [facility], [place] [address] Phone: [phone] Fax: [facility] [address], [place] [address] [place] [address] Phone: [phone] Fax: [phone]     Social Determinants of  Health (SDOH) Interventions    Readmission Risk Interventions    08/30/2021   12:37 PM  Readmission Risk Prevention Plan  Medication Screening Complete  Transportation Screening Complete

## 2022-05-01 NOTE — Evaluation (Signed)
Physical Therapy Evaluation Patient Details Name: MARC SIVERTSEN MRN: [id number] DOB: 1940-11-07 Today's Date: 05/01/2022  History of Present Illness  81 y.o. female presents to [facility] hospital on 04/30/2022 with episodic weakness and lightheadedness. Pt experienced a fall on 04/29/2022 prior to admission. PMH inlcudes CHF, HTN, DMII, gout, DVT/PE, OSA, PAF.  Clinical Impression  Pt presents to PT with deficits in functional mobility, strength, power, ROM, endurance, gait, balance. Pt requiring assistance for LE mobility at bed level and with significant RLE weakness with standing attempts. Pt remains at a high risk for falls due to RLE instability accompanied by generalized weakness. Pt will benefit from aggressive mobilization in an effort to reduce falls risk. PT recommends SNF placement at this time.       Recommendations for follow up therapy are one component of a multi-disciplinary discharge planning process, led by the attending physician.  Recommendations may be updated based on patient status, additional functional criteria and insurance authorization.  Follow Up Recommendations Skilled nursing-short term rehab (<3 hours/day) Can patient physically be transported by private vehicle: No    Assistance Recommended at Discharge Intermittent Supervision/Assistance  Patient can return home with the following  Two people to help with walking and/or transfers;Two people to help with bathing/dressing/bathroom;Assistance with cooking/housework;Assist for transportation;Help with stairs or ramp for entrance    Equipment Recommendations Wheelchair (measurements PT);Wheelchair cushion (measurements PT)  Recommendations for Other Services       Functional Status Assessment Patient has had a recent decline in their functional status and demonstrates the ability to make significant improvements in function in a reasonable and predictable amount of time.     Precautions / Restrictions  Precautions Precautions: Fall Restrictions Weight Bearing Restrictions: No      Mobility  Bed Mobility Overal bed mobility: Needs Assistance Bed Mobility: Supine to Sit, Sit to Supine     Supine to sit: Mod assist, HOB elevated Sit to supine: Max assist   General bed mobility comments: assist with BLEs and hand hold to pull trunk into upright    Transfers Overall transfer level: Needs assistance Equipment used: 1 person hand held assist Transfers: Sit to/from Stand Sit to Stand: Mod assist           General transfer comment: L knee block and BUE support, face to face transfer    Ambulation/Gait Ambulation/Gait assistance:  (unable to side step due to RLE weakness and instability)                Stairs            Wheelchair Mobility    Modified Rankin (Stroke Patients Only)       Balance Overall balance assessment: Needs assistance Sitting-balance support: No upper extremity supported, Feet supported Sitting balance-Leahy Scale: Fair     Standing balance support: Bilateral upper extremity supported Standing balance-Leahy Scale: Poor Standing balance comment: min-modA with BUE support of PT                             Pertinent Vitals/Pain Pain Assessment Pain Assessment: 0-10 Pain Score: 7  Pain Location: R knee Pain Descriptors / Indicators: Aching Pain Intervention(s): Monitored during session    Home Living Family/patient expects to be discharged to:: Private residence Living Arrangements: Alone Available Help at Discharge: Family;Neighbor;Available PRN/intermittently Type of Home: House Home Access: Stairs to enter Entrance Stairs-Rails: Can reach both Entrance Stairs-Number of Steps: 2   Home Layout: One level  Home Equipment: Rollator (4 wheels);Grab bars - tub/shower;Shower seat - built in;BSC/3in1;Cane - quad;Cane - single point;Hospital bed      Prior Function Prior Level of Function :  Driving;Independent/Modified Independent             Mobility Comments: ambulates with rollator       Hand Dominance   Dominant Hand: Right    Extremity/Trunk Assessment   Upper Extremity Assessment Upper Extremity Assessment: Generalized weakness    Lower Extremity Assessment Lower Extremity Assessment: Generalized weakness;RLE deficits/detail RLE Deficits / Details: R knee pain limiting strength, grossly 3-/5    Cervical / Trunk Assessment Cervical / Trunk Assessment: Other exceptions Cervical / Trunk Exceptions: excess body habitus  Communication   Communication: No difficulties  Cognition Arousal/Alertness: Awake/alert Behavior During Therapy: WFL for tasks assessed/performed Overall Cognitive Status: Within Functional Limits for tasks assessed                                          General Comments General comments (skin integrity, edema, etc.): VSS on RA, sats in low 90s, BP low 80s/60s this morning and during session    Exercises     Assessment/Plan    PT Assessment Patient needs continued PT services  PT Problem List Decreased strength;Decreased activity tolerance;Decreased balance;Decreased mobility;Decreased knowledge of use of DME;Decreased safety awareness;Decreased knowledge of precautions;Cardiopulmonary status limiting activity;Pain       PT Treatment Interventions DME instruction;Gait training;Functional mobility training;Therapeutic activities;Therapeutic exercise;Balance training;Neuromuscular re-education;Patient/family education;Wheelchair mobility training;Stair training    PT Goals (Current goals can be found in the Care Plan section)  Acute Rehab PT Goals Patient Stated Goal: to return to independent mobility PT Goal Formulation: With patient Time For Goal Achievement: 05/15/22 Potential to Achieve Goals: Fair    Frequency Min 2X/week     Co-evaluation               AM-PAC PT "6 Clicks" Mobility   Outcome Measure Help needed turning from your back to your side while in a flat bed without using bedrails?: A Lot Help needed moving from lying on your back to sitting on the side of a flat bed without using bedrails?: A Lot Help needed moving to and from a bed to a chair (including a wheelchair)?: Total Help needed standing up from a chair using your arms (e.g., wheelchair or bedside chair)?: A Lot Help needed to walk in hospital room?: Total Help needed climbing 3-5 steps with a railing? : Total 6 Click Score: 9    End of Session   Activity Tolerance: Patient limited by fatigue Patient left: in bed;with call bell/phone within reach;with bed alarm set;with nursing/sitter in room Nurse Communication: Mobility status;Need for lift equipment (STEDY) PT Visit Diagnosis: Other abnormalities of gait and mobility (R26.89);Muscle weakness (generalized) (M62.81);History of falling (Z91.81)    Time: 5397-6734 PT Time Calculation (min) (ACUTE ONLY): 39 min   Charges:   PT Evaluation $PT Eval Low Complexity: Pass Christian, PT, DPT Acute Rehabilitation Office [phone]   [name] 05/01/2022, 11:55 AM

## 2022-05-01 NOTE — Progress Notes (Signed)
PROGRESS NOTE  Natasha Glass QJJ:[id number] DOB: 1941/01/28 DOA: 04/30/2022 PCP: Josetta Huddle, MD  HPI/Recap of past 51 hours: 81 year old female with a past medical history of diastolic congestive heart failure, hypertension, non-insulin-dependent diabetes mellitus type 2, gout, prior history of DVT/PE, obstructive sleep apnea on CPAP and paroxysmal atrial fibrillation (On Warfarin/Tikosyn) and gastroesophageal reflux disease who presented to [facility] emergency department with complaints of episodic weakness and lightheadedness, because of patient's legs being so weak and unable to bear weight and falling to the floor.  Patient has also noted nonproductive cough.  In the ED found to be in A-fib with RVR as well as positive for COVID.  Cardiology consulted.    Today, patient denies any new complaints, still reports pain weak overall denies any chest pain.    Assessment/Plan: Principal Problem:   Atrial fibrillation with rapid ventricular response (HCC) Active Problems:   COVID-19 virus infection   Fall at home, initial encounter   Chronic diastolic (congestive) heart failure (HCC)   Chest pain   Type 2 diabetes mellitus without complication, without long-term current use of insulin ([place])   Essential hypertension   Mixed diabetic hyperlipidemia associated with type 2 diabetes mellitus (HCC)   OSA on CPAP   GERD without esophagitis   Gout   Paroxysmal atrial fibrillation with rapid ventricular response ([place]) Likely in the setting of COVID infection Heart rate with better control Echo showed EF of 70 to 75%, no regional wall motion abnormalities, left ventricular diastolic parameters are indeterminate Cardiology on board, continue home dose of Tikosyn, increase metoprolol, wean off Cardizem drip as possible, if unable to control heart rate, may need cardioversion after she recovers from [place].  She will need TEE prior to cardioversion as INR is 1.8 Continue heparin  infusion Telemetry   COVID-19 virus infection Acute hypoxic respiratory failure Currently saturating around 91% on 2 L of oxygen Chest x-ray unremarkable BC x2 pending CRP elevated, will trend Presence of hypoxia, initiated Solu-Medrol, followed by prednisone Continue 5-day course of Paxlovid Continue bronchodilators, cough suppressants Continue to trend inflammatory markers Monitor closely  Chest pain Episodes of atypical chest discomfort likely related to rapid atrial fibrillation Serial cardiac enzymes here are unremarkable No evidence of dynamic ST segment change on EKG Telemetry  Fall at home, initial encounter Weakness likely secondary to COVID-19 infection with bouts of rapid atrial fibrillation Complains of right knee pain have been evaluated with both x-ray and CT imaging of the right knee which revealed no evidence of damage to the hardware or bony injury PT/OT Fall precautions  Chronic diastolic (congestive) heart failure (HCC) No clinical evidence of cardiogenic volume overload Continue home Lasix Strict I's and O's, daily weights   Type 2 diabetes mellitus without complication, without long-term current use of insulin (HCC) A1c 6.4 SSI, Accu-Cheks, hypoglycemic protocol  Essential hypertension BP soft Monitor BP closely as patient is in A-fib with RVR Adjust Cardizem drip as needed   Mixed diabetic hyperlipidemia associated with type 2 diabetes mellitus ([place]) Continuing home regimen of lipid lowering therapy.   OSA on CPAP Continuing home regimen of CPAP nightly   GERD without esophagitis Protonix 40 mg daily.   Gout Continuing home regimen of allopurinol   Morbid obesity Lifestyle modification advised   Estimated body mass index is 49.75 kg/m as calculated from the following:   Height as of this encounter: 5' 3"  (1.6 m).   Weight as of this encounter: 127.4 kg.     Code Status:  Full  Family Communication: None at bedside  Disposition  Plan: Status is: Observation The patient will require care spanning > 2 midnights and should be moved to inpatient because: Level of care    Consultants: Cardiology  Procedures: None  Antimicrobials: None  DVT prophylaxis: Heparin drip   Objective: Vitals:   05/01/22 0740 05/01/22 0742 05/01/22 1207 05/01/22 1610  BP: (!) 80/55 (!) 80/55 111/70 (!) 89/58  Pulse: 90 90 77 (!) 106  Resp: 20 20 (!) 22 (!) 21  Temp: 98.5 F (36.9 C) 98.5 F (36.9 C) 97.6 F (36.4 C) 98 F (36.7 C)  TempSrc: Oral  Oral Oral  SpO2: 91%  92%   Weight:      Height:        Intake/Output Summary (Last 24 hours) at 05/01/2022 1647 Last data filed at 05/01/2022 1500 Gross per 24 hour  Intake 776.43 ml  Output 1000 ml  Net -223.57 ml   Filed Weights   04/30/22 1810 05/01/22 0615  Weight: 126.6 kg 127.4 kg    Exam: General: NAD  Cardiovascular: S1, S2 present Respiratory: CTAB Abdomen: Soft, nontender, nondistended, bowel sounds present Musculoskeletal: Trace bilateral pedal edema noted Skin: Normal Psychiatry: Normal mood      Data Reviewed: CBC: Recent Labs  Lab 04/30/22 1311  WBC 7.9  HGB 13.1  HCT 42.5  MCV 89.9  PLT 993*   Basic Metabolic Panel: Recent Labs  Lab 04/30/22 1311  NA 136  K 4.1  CL 100  CO2 24  GLUCOSE 137*  BUN 19  CREATININE 0.81  CALCIUM 9.4   GFR: Estimated Creatinine Clearance: 72.1 mL/min (by C-G formula based on SCr of 0.81 mg/dL). Liver Function Tests: Recent Labs  Lab 04/30/22 1311  AST 54*  ALT 17  ALKPHOS 50  BILITOT 0.6  PROT 7.5  ALBUMIN 3.6   No results for input(s): "LIPASE", "AMYLASE" in the last 168 hours. No results for input(s): "AMMONIA" in the last 168 hours. Coagulation Profile: Recent Labs  Lab 04/30/22 1421  INR 1.8*   Cardiac Enzymes: No results for input(s): "CKTOTAL", "CKMB", "CKMBINDEX", "TROPONINI" in the last 168 hours. BNP (last 3 results) No results for input(s): "PROBNP" in the last 8760  hours. HbA1C: Recent Labs    05/01/22 0657  HGBA1C 6.4*   CBG: Recent Labs  Lab 04/30/22 2252 05/01/22 0924 05/01/22 1206 05/01/22 1613  GLUCAP 123* 131* 100* 153*   Lipid Profile: No results for input(s): "CHOL", "HDL", "LDLCALC", "TRIG", "CHOLHDL", "LDLDIRECT" in the last 72 hours. Thyroid Function Tests: Recent Labs    05/01/22 0657  TSH 2.121   Anemia Panel: No results for input(s): "VITAMINB12", "FOLATE", "FERRITIN", "TIBC", "IRON", "RETICCTPCT" in the last 72 hours. Urine analysis:    Component Value Date/Time   COLORURINE STRAW (A) 04/30/2022 1207   APPEARANCEUR CLEAR 04/30/2022 1207   LABSPEC 1.005 04/30/2022 1207   PHURINE 6.0 04/30/2022 1207   GLUCOSEU NEGATIVE 04/30/2022 1207   HGBUR SMALL (A) 04/30/2022 1207   BILIRUBINUR NEGATIVE 04/30/2022 1207   KETONESUR NEGATIVE 04/30/2022 1207   PROTEINUR NEGATIVE 04/30/2022 1207   UROBILINOGEN 0.2 01/04/2011 0915   NITRITE NEGATIVE 04/30/2022 1207   LEUKOCYTESUR NEGATIVE 04/30/2022 1207   Sepsis Labs: @LABRCNTIP (procalcitonin:4,lacticidven:4)  ) Recent Results (from the past 240 hour(s))  Resp Panel by RT-PCR (Flu A&B, Covid) Anterior Nasal Swab     Status: Abnormal   Collection Time: 04/30/22  2:51 PM   Specimen: Anterior Nasal Swab  Result Value Ref Range Status  SARS Coronavirus 2 by RT PCR POSITIVE (A) NEGATIVE Final    Comment: (NOTE) SARS-CoV-2 target nucleic acids are DETECTED.  The SARS-CoV-2 RNA is generally detectable in upper respiratory specimens during the acute phase of infection. Positive results are indicative of the presence of the identified virus, but do not rule out bacterial infection or co-infection with other pathogens not detected by the test. Clinical correlation with patient history and other diagnostic information is necessary to determine patient infection status. The expected result is Negative.  Fact Sheet for Patients: EntrepreneurPulse.com.au  Fact  Sheet for Healthcare Providers: IncredibleEmployment.be  This test is not yet approved or cleared by the [place] FDA and  has been authorized for detection and/or diagnosis of SARS-CoV-2 by FDA under an Emergency Use Authorization (EUA).  This EUA will remain in effect (meaning this test can be used) for the duration of  the COVID-19 declaration under Section 564(b)(1) of the A ct, 21 U.S.C. section 360bbb-3(b)(1), unless the authorization is terminated or revoked sooner.     Influenza A by PCR NEGATIVE NEGATIVE Final   Influenza B by PCR NEGATIVE NEGATIVE Final    Comment: (NOTE) The Xpert Xpress SARS-CoV-2/FLU/RSV plus assay is intended as an aid in the diagnosis of influenza from Nasopharyngeal swab specimens and should not be used as a sole basis for treatment. Nasal washings and aspirates are unacceptable for Xpert Xpress SARS-CoV-2/FLU/RSV testing.  Fact Sheet for Patients: EntrepreneurPulse.com.au  Fact Sheet for Healthcare Providers: IncredibleEmployment.be  This test is not yet approved or cleared by the [place] FDA and has been authorized for detection and/or diagnosis of SARS-CoV-2 by FDA under an Emergency Use Authorization (EUA). This EUA will remain in effect (meaning this test can be used) for the duration of the COVID-19 declaration under Section 564(b)(1) of the Act, 21 U.S.C. section 360bbb-3(b)(1), unless the authorization is terminated or revoked.  Performed at [facility], [place] [address] [place], [place] [address]       Studies: ECHOCARDIOGRAM COMPLETE  Result Date: 05/01/2022    ECHOCARDIOGRAM REPORT   Patient Name:   Jaylee Freeze Latif Date of Exam: 05/01/2022 Medical Rec #:  [id number]      Height:       63.0 in Accession #:    [id number]     Weight:       280.9 lb Date of Birth:  11/13/1940     BSA:          2.234 m Patient Age:    59 years       BP:           102/68 mmHg Patient  Gender: F              HR:           80 bpm. Exam Location:  Inpatient Procedure: 2D Echo, Cardiac Doppler and Color Doppler Indications:    I48.91* Unspeicified atrial fibrillation  History:        Patient has prior history of Echocardiogram examinations, most                 recent 06/02/2017. Abnormal ECG, Arrythmias:Atrial Fibrillation,                 Signs/Symptoms:Chest Pain; Risk Factors:Sleep Apnea, Diabetes                 and Hypertension.  Sonographer:    Roseanna Rainbow RDCS Referring Phys: Sherryll Burger [facility]  Sonographer Comments: Patient is morbidly  obese. Image acquisition challenging due to patient body habitus. IMPRESSIONS  1. Left ventricular ejection fraction, by estimation, is 70 to 75%. The left ventricle has hyperdynamic function. The left ventricle has no regional wall motion abnormalities. Left ventricular diastolic parameters are indeterminate.  2. Right ventricular systolic function is normal. The right ventricular size is normal. Tricuspid regurgitation signal is inadequate for assessing PA pressure.  3. Left atrial size was mildly dilated.  4. The mitral valve is abnormal. Trivial mitral valve regurgitation. No evidence of mitral stenosis. Severe mitral annular calcification.  5. The aortic valve is tricuspid. There is mild calcification of the aortic valve. There is mild thickening of the aortic valve. Aortic valve regurgitation is mild. Aortic valve sclerosis/calcification is present, without any evidence of aortic stenosis.  6. Aortic dilatation noted. There is moderate dilatation of the ascending aorta, measuring 46 mm.  7. The inferior vena cava is dilated in size with >50% respiratory variability, suggesting right atrial pressure of 8 mmHg. Comparison(s): No significant change from prior study. FINDINGS  Left Ventricle: Left ventricular ejection fraction, by estimation, is 70 to 75%. The left ventricle has hyperdynamic function. The left ventricle has no regional wall motion abnormalities.  The left ventricular internal cavity size was normal in size. There is no left ventricular hypertrophy. Left ventricular diastolic parameters are indeterminate. Right Ventricle: The right ventricular size is normal. No increase in right ventricular wall thickness. Right ventricular systolic function is normal. Tricuspid regurgitation signal is inadequate for assessing PA pressure. Left Atrium: Left atrial size was mildly dilated. Right Atrium: Right atrial size was normal in size. Pericardium: There is no evidence of pericardial effusion. Mitral Valve: The mitral valve is abnormal. There is mild thickening of the mitral valve leaflet(s). There is mild calcification of the mitral valve leaflet(s). Severe mitral annular calcification. Trivial mitral valve regurgitation. No evidence of mitral valve stenosis. Tricuspid Valve: The tricuspid valve is normal in structure. Tricuspid valve regurgitation is trivial. Aortic Valve: The aortic valve is tricuspid. There is mild calcification of the aortic valve. There is mild thickening of the aortic valve. Aortic valve regurgitation is mild. Aortic valve sclerosis/calcification is present, without any evidence of aortic stenosis. Pulmonic Valve: The pulmonic valve was not well visualized. Pulmonic valve regurgitation is trivial. Aorta: Aortic dilatation noted. There is moderate dilatation of the ascending aorta, measuring 46 mm. Venous: The inferior vena cava is dilated in size with greater than 50% respiratory variability, suggesting right atrial pressure of 8 mmHg. IAS/Shunts: The atrial septum is grossly normal.  LEFT VENTRICLE PLAX 2D LVIDd:         4.20 cm LVIDs:         2.60 cm LV PW:         1.20 cm LV IVS:        0.87 cm LVOT diam:     2.20 cm LV SV:         76 LV SV Index:   34 LVOT Area:     3.80 cm  LV Volumes (MOD) LV vol d, MOD A2C: 54.2 ml LV vol d, MOD A4C: 54.4 ml LV vol s, MOD A2C: 15.8 ml LV vol s, MOD A4C: 12.3 ml LV SV MOD A2C:     38.4 ml LV SV MOD A4C:      54.4 ml LV SV MOD BP:      41.5 ml RIGHT VENTRICLE             IVC RV S prime:  14.30 cm/s  IVC diam: 2.60 cm TAPSE (M-mode): 1.7 cm LEFT ATRIUM             Index        RIGHT ATRIUM           Index LA diam:        3.30 cm 1.48 cm/m   RA Area:     13.00 cm LA Vol (A2C):   79.1 ml 35.41 ml/m  RA Volume:   26.20 ml  11.73 ml/m LA Vol (A4C):   65.0 ml 29.10 ml/m LA Biplane Vol: 74.3 ml 33.26 ml/m  AORTIC VALVE             PULMONIC VALVE LVOT Vmax:   125.50 cm/s PR End Diast Vel: 1.30 msec LVOT Vmean:  77.350 cm/s LVOT VTI:    0.200 m  AORTA Ao Root diam: 3.20 cm Ao Asc diam:  4.55 cm MITRAL VALVE MV Area (PHT): 2.63 cm     SHUNTS MV Decel Time: 289 msec     Systemic VTI:  0.20 m MV E velocity: 106.00 cm/s  Systemic Diam: 2.20 cm Gwyndolyn Kaufman MD Electronically signed by Gwyndolyn Kaufman MD Signature Date/Time: 05/01/2022/1:55:13 PM    Final     Scheduled Meds:  albuterol  2 puff Inhalation Q6H   allopurinol  150 mg Oral QPM   vitamin C  500 mg Oral Daily   dofetilide  250 mcg Oral BID   furosemide  40 mg Oral Daily   insulin aspart  0-15 Units Subcutaneous TID AC & HS   ipratropium-albuterol  3 mL Nebulization Once   metoprolol tartrate  50 mg Oral BID   molnupiravir EUA  4 capsule Oral BID   pantoprazole  40 mg Oral Daily   rosuvastatin  5 mg Oral Daily   sodium chloride flush  3 mL Intravenous Q12H   zinc sulfate  220 mg Oral Daily    Continuous Infusions:  diltiazem (CARDIZEM) infusion 5 mg/hr (05/01/22 0743)   heparin 1,550 Units/hr (05/01/22 1614)     LOS: 0 days     Alma Friendly, MD [facility]  If 7PM-7AM, please contact night-coverage [email or web address] 05/01/2022, 4:47 PM

## 2022-05-01 NOTE — ED Notes (Signed)
ED TO INPATIENT HANDOFF REPORT  S Name/Age/Gender Natasha Glass All 81 y.o. female Room/Bed: 026C/026C  Code Status   Code Status: Full Code  Home  Alert and oriented x4  Triage Complete: Triage complete  Chief Complaint Atrial fibrillation with rapid ventricular response ([place]) [I48.91]  Triage Note Pt reports intermittent afib since last Thursday.  Appt scheduled at [facility] this Thursday but she cancelled it since coming to ED.  Fell last night because "legs felt rubbery".  Denies LOC.  Reports R knee pain since fall.   Allergies Allergies  Allergen Reactions   Bactrim [Sulfamethoxazole-Trimethoprim]     Drop in BP   Morphine And Related Other (See Comments)    Unresponsive-Very bad reaction.  Can take hydrocodone.    Oysters [Shellfish Allergy] Shortness Of Breath, Itching, Swelling and Rash   Amlodipine Swelling and Other (See Comments)    Of legs   Percocet [Oxycodone-Acetaminophen] Other (See Comments)    hallucinations   Tdap [Tetanus-Diphth-Acell Pertussis] Itching, Swelling and Rash   Tetanus Toxoids Itching, Swelling and Rash   Tramadol Swelling and Other (See Comments)    Of legs   Carbamazepine Other (See Comments)   Lactose Intolerance (Gi)    Macrodantin [Nitrofurantoin] Other (See Comments)   Other Other (See Comments)   Tetanus-Diphtheria Toxoids Td Other (See Comments)   Topiramate Other (See Comments)   Diovan [Valsartan] Other (See Comments)    Cough   Diphtheria Toxoid Itching, Swelling and Rash   Macrodantin [Nitrofurantoin Macrocrystal] Other (See Comments)    unspecified    Level of Care/Admitting Diagnosis ED Disposition     ED Disposition  Admit   Condition  --   [place]: [place] [[id number]]  Level of Care: Progressive [102]  Admit to Progressive based on following criteria: RESPIRATORY PROBLEMS hypoxemic/hypercapnic respiratory failure that is responsive to NIPPV (BiPAP) or High Flow Nasal Cannula  (6-80 lpm). Frequent assessment/intervention, no > Q2 hrs < Q4 hrs, to maintain oxygenation and pulmonary hygiene.  Admit to Progressive based on following criteria: CARDIOVASCULAR & THORACIC of moderate stability with acute coronary syndrome symptoms/low risk myocardial infarction/hypertensive urgency/arrhythmias/heart failure potentially compromising stability and stable post cardiovascular intervention patients.  May place patient in observation at [facility] or [facility] if equivalent level of care is available:: No  Covid Evaluation: Confirmed COVID Positive  Diagnosis: Atrial fibrillation with rapid ventricular response [facility]) [[id number]]  Admitting Physician: Vernelle Emerald [[id number]]  Attending Physician: Vernelle Emerald [[id number]]          B Medical/Surgery History Past Medical History:  Diagnosis Date   Anemia    Arthritis    DVT (deep venous thrombosis) ([place]) 01/2009   GERD (gastroesophageal reflux disease)    Gout    History of blood transfusion    1990's   History of colonic polyps 09/2010   History of pneumonia    Hyperlipidemia    Hypertension    Lactose intolerance    Macular degeneration, dry    bilateral   Migraine    Obesity    Osteopenia    PAF (paroxysmal atrial fibrillation) (HCC)    Peripheral neuropathy    Pulmonary embolism ([employer]) 01/2009   Sleep apnea    CPAP pressure 4.0-16.0   Thoracic ascending aortic aneurysm (HCC)    Type II diabetes mellitus ([place])    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)    Urinary incontinence    Past Surgical History:  Procedure Laterality Date   ABDOMINAL  [place]   CARDIAC CATHETERIZATION     CARDIOVERSION N/A 05/07/2018   Procedure: CARDIOVERSION;  Surgeon: Josue Hector, MD;  Location: [facility] ENDOSCOPY;  Service: Cardiovascular;  Laterality: N/A;   COLONOSCOPY W/ BIOPSIES AND POLYPECTOMY  2005; 2011; 2012   COLONOSCOPY WITH PROPOFOL N/A 09/10/2016    Procedure: COLONOSCOPY WITH PROPOFOL;  Surgeon: Garlan Fair, MD;  Location: [facility] ENDOSCOPY;  Service: Endoscopy;  Laterality: N/A;   JOINT REPLACEMENT     KNEE ARTHROSCOPY Left ~ [place] Left 2011   TOTAL KNEE ARTHROPLASTY Bilateral 2005-2012   left-right     A IV Location/Drains/Wounds Patient Lines/Drains/Airways Status     Active Line/Drains/Airways     Name Placement date Placement time Site Days   Peripheral IV 04/30/22 20 G Left Antecubital 04/30/22  1454  Antecubital  1   Peripheral IV 04/30/22 22 G 1.75" Right;Anterior Forearm 04/30/22  2038  Forearm  1   External Urinary Catheter 08/26/21  1822  --  248            Intake/Output Last 24 hours  Intake/Output Summary (Last 24 hours) at 05/01/2022 0451 Last data filed at 05/01/2022 0013 Gross per 24 hour  Intake 32.44 ml  Output 1000 ml  Net -967.56 ml    Labs/Imaging Results for orders placed or performed during the hospital encounter of 04/30/22 (from the past 48 hour(s))  Urinalysis, Routine w reflex microscopic Anterior Nasal Swab     Status: Abnormal   Collection Time: 04/30/22 12:07 PM  Result Value Ref Range   Color, Urine STRAW (A) YELLOW   APPearance CLEAR CLEAR   Specific Gravity, Urine 1.005 1.005 - 1.030   pH 6.0 5.0 - 8.0   Glucose, UA NEGATIVE NEGATIVE mg/dL   Hgb urine dipstick SMALL (A) NEGATIVE   Bilirubin Urine NEGATIVE NEGATIVE   Ketones, ur NEGATIVE NEGATIVE mg/dL   Protein, ur NEGATIVE NEGATIVE mg/dL   Nitrite NEGATIVE NEGATIVE   Leukocytes,Ua NEGATIVE NEGATIVE   RBC / HPF 0-5 0 - 5 RBC/hpf   WBC, UA 0-5 0 - 5 WBC/hpf   Bacteria, UA RARE (A) NONE SEEN   Squamous Epithelial / LPF 0-5 0 - 5    Comment: Performed at [facility], [address], [place], [place] [address]  CBC     Status: Abnormal   Collection Time: 04/30/22  1:11 PM  Result Value Ref Range   WBC 7.9 4.0 - 10.5 K/uL   RBC 4.73 3.87 - 5.11 MIL/uL   Hemoglobin 13.1 12.0 - 15.0  g/dL   HCT 42.5 36.0 - 46.0 %   MCV 89.9 80.0 - 100.0 fL   MCH 27.7 26.0 - 34.0 pg   MCHC 30.8 30.0 - 36.0 g/dL   RDW 17.2 (H) 11.5 - 15.5 %   Platelets 119 (L) 150 - 400 K/uL    Comment: REPEATED TO VERIFY   nRBC 0.0 0.0 - 0.2 %    Comment: Performed at [facility], [place] [address] [place], [place] [address]  Comprehensive metabolic panel     Status: Abnormal   Collection Time: 04/30/22  1:11 PM  Result Value Ref Range   Sodium 136 135 - 145 mmol/L   Potassium 4.1 3.5 - 5.1 mmol/L   Chloride 100 98 - 111 mmol/L   CO2 24 22 - 32 mmol/L   Glucose, Bld 137 (H) 70 - 99 mg/dL  Comment: Glucose reference range applies only to samples taken after fasting for at least 8 hours.   BUN 19 8 - 23 mg/dL   Creatinine, Ser 0.81 0.44 - 1.00 mg/dL   Calcium 9.4 8.9 - 10.3 mg/dL   Total Protein 7.5 6.5 - 8.1 g/dL   Albumin 3.6 3.5 - 5.0 g/dL   AST 54 (H) 15 - 41 U/L   ALT 17 0 - 44 U/L   Alkaline Phosphatase 50 38 - 126 U/L   Total Bilirubin 0.6 0.3 - 1.2 mg/dL   GFR, Estimated >60 >60 mL/min    Comment: (NOTE) Calculated using the CKD-EPI Creatinine Equation (2021)    Anion gap 12 5 - 15    Comment: Performed at [facility] [address], [place], [place] [address]  Brain natriuretic peptide     Status: Abnormal   Collection Time: 04/30/22  1:11 PM  Result Value Ref Range   B Natriuretic Peptide 150.1 (H) 0.0 - 100.0 pg/mL    Comment: Performed at [facility] [address] [place], [place] [address]  Troponin I (High Sensitivity)     Status: None   Collection Time: 04/30/22  1:11 PM  Result Value Ref Range   Troponin I (High Sensitivity) 17 <18 ng/L    Comment: (NOTE) Elevated high sensitivity troponin I (hsTnI) values and significant  changes across serial measurements may suggest ACS but many other  chronic and acute conditions are known to elevate hsTnI results.  Refer to the "Links" section for chest pain algorithms and additional  guidance. Performed  at [facility], [place] [address], [place], [place] [address]   Protime-INR     Status: Abnormal   Collection Time: 04/30/22  2:21 PM  Result Value Ref Range   Prothrombin Time 20.2 (H) 11.4 - 15.2 seconds   INR 1.8 (H) 0.8 - 1.2    Comment: (NOTE) INR goal varies based on device and disease states. Performed at [facility], [place] [address], [place], [place] [address]   Resp Panel by RT-PCR (Flu A&B, Covid) Anterior Nasal Swab     Status: Abnormal   Collection Time: 04/30/22  2:51 PM   Specimen: Anterior Nasal Swab  Result Value Ref Range   SARS Coronavirus 2 by RT PCR POSITIVE (A) NEGATIVE    Comment: (NOTE) SARS-CoV-2 target nucleic acids are DETECTED.  The SARS-CoV-2 RNA is generally detectable in upper respiratory specimens during the acute phase of infection. Positive results are indicative of the presence of the identified virus, but do not rule out bacterial infection or co-infection with other pathogens not detected by the test. Clinical correlation with patient history and other diagnostic information is necessary to determine patient infection status. The expected result is Negative.  Fact Sheet for Patients: EntrepreneurPulse.com.au  Fact Sheet for Healthcare Providers: IncredibleEmployment.be  This test is not yet approved or cleared by the [place] FDA and  has been authorized for detection and/or diagnosis of SARS-CoV-2 by FDA under an Emergency Use Authorization (EUA).  This EUA will remain in effect (meaning this test can be used) for the duration of  the COVID-19 declaration under Section 564(b)(1) of the A ct, 21 U.S.C. section 360bbb-3(b)(1), unless the authorization is terminated or revoked sooner.     Influenza A by PCR NEGATIVE NEGATIVE   Influenza B by PCR NEGATIVE NEGATIVE    Comment: (NOTE) The Xpert Xpress SARS-CoV-2/FLU/RSV plus assay is intended as an aid in the  diagnosis of influenza from  Nasopharyngeal swab specimens and should not be used as a sole basis for treatment. Nasal washings and aspirates are unacceptable for Xpert Xpress SARS-CoV-2/FLU/RSV testing.  Fact Sheet for Patients: EntrepreneurPulse.com.au  Fact Sheet for Healthcare Providers: IncredibleEmployment.be  This test is not yet approved or cleared by the [place] FDA and has been authorized for detection and/or diagnosis of SARS-CoV-2 by FDA under an Emergency Use Authorization (EUA). This EUA will remain in effect (meaning this test can be used) for the duration of the COVID-19 declaration under Section 564(b)(1) of the Act, 21 U.S.C. section 360bbb-3(b)(1), unless the authorization is terminated or revoked.  Performed at [facility], [place] [address] [place], [place] [address]   Troponin I (High Sensitivity)     Status: None   Collection Time: 04/30/22  4:25 PM  Result Value Ref Range   Troponin I (High Sensitivity) 16 <18 ng/L    Comment: (NOTE) Elevated high sensitivity troponin I (hsTnI) values and significant  changes across serial measurements may suggest ACS but many other  chronic and acute conditions are known to elevate hsTnI results.  Refer to the "Links" section for chest pain algorithms and additional  guidance. Performed at [facility], [place] [address], [place], [place] [address]   CBG monitoring, ED     Status: Abnormal   Collection Time: 04/30/22 10:52 PM  Result Value Ref Range   Glucose-Capillary 123 (H) 70 - 99 mg/dL    Comment: Glucose reference range applies only to samples taken after fasting for at least 8 hours.  C-reactive protein     Status: Abnormal   Collection Time: 04/30/22 10:55 PM  Result Value Ref Range   CRP 6.5 (H) <1.0 mg/dL    Comment: Performed at [facility] [address], [place], [place] [address]  Procalcitonin - Baseline     Status: None   Collection Time: 04/30/22 10:55 PM  Result  Value Ref Range   Procalcitonin <0.10 ng/mL    Comment:        Interpretation: PCT (Procalcitonin) <= 0.5 ng/mL: Systemic infection (sepsis) is not likely. Local bacterial infection is possible. (NOTE)       Sepsis PCT Algorithm           Lower Respiratory Tract                                      Infection PCT Algorithm    ----------------------------     ----------------------------         PCT < 0.25 ng/mL                PCT < 0.10 ng/mL          Strongly encourage             Strongly discourage   discontinuation of antibiotics    initiation of antibiotics    ----------------------------     -----------------------------       PCT 0.25 - 0.50 ng/mL            PCT 0.10 - 0.25 ng/mL               OR       >80% decrease in PCT            Discourage initiation of  antibiotics      Encourage discontinuation           of antibiotics    ----------------------------     -----------------------------         PCT >= 0.50 ng/mL              PCT 0.26 - 0.50 ng/mL               AND        <80% decrease in PCT             Encourage initiation of                                             antibiotics       Encourage continuation           of antibiotics    ----------------------------     -----------------------------        PCT >= 0.50 ng/mL                  PCT > 0.50 ng/mL               AND         increase in PCT                  Strongly encourage                                      initiation of antibiotics    Strongly encourage escalation           of antibiotics                                     -----------------------------                                           PCT <= 0.25 ng/mL                                                 OR                                        > 80% decrease in PCT                                      Discontinue / Do not initiate                                             antibiotics  Performed at  [facility], [place] [address], [place], [place] [address]   D-dimer, quantitative     Status: Abnormal   Collection  Time: 04/30/22 10:55 PM  Result Value Ref Range   D-Dimer, Quant 1.16 (H) 0.00 - 0.50 ug/mL-FEU    Comment: (NOTE) At the manufacturer cut-off value of 0.5 g/mL FEU, this assay has a negative predictive value of 95-100%.This assay is intended for use in conjunction with a clinical pretest probability (PTP) assessment model to exclude pulmonary embolism (PE) and deep venous thrombosis (DVT) in outpatients suspected of PE or DVT. Results should be correlated with clinical presentation. Performed at [facility], [place] [address] [place], [place] [address]    CT Knee Right Wo Contrast  Result Date: 04/30/2022 CLINICAL DATA:  Right knee trauma.  Occult fracture suspected. EXAM: CT OF THE RIGHT KNEE WITHOUT CONTRAST TECHNIQUE: Multidetector CT imaging of the right knee was performed according to the standard protocol. Multiplanar CT image reconstructions were also generated. RADIATION DOSE REDUCTION: This exam was performed according to the departmental dose-optimization program which includes automated exposure control, adjustment of the mA and/or kV according to patient size and/or use of iterative reconstruction technique. 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 Within this limitation, no definite perihardware lucency is seen to indicate hardware failure or loosening. No definite acute fracture is identified. There is diffuse decreased bone mineralization. Ligaments Suboptimally assessed by CT. Muscles and Tendons Mild infiltration of visualized portions of the quadriceps and soleus and medial head of the gastrocnemius lateral head of the gastrocnemius muscles. No gross tendon tear is visualized. Soft tissues Streak  artifact limits evaluation for a joint effusion however no large joint effusion is seen. There is mild-to-moderate edema and swelling within the subcutaneous fat of the visualized proximal calf. IMPRESSION: 1. Within the limitations of metallic streak artifact from total right knee arthroplasty hardware, no acute fracture is seen. 2. No evidence of hardware failure. Electronically Signed   By: Yvonne Kendall M.D.   On: 04/30/2022 15:27   DG Chest 2 View  Result Date: 04/30/2022 CLINICAL DATA:  Patient fell last night. Intermittent atrial fibrillation with shortness of breath and cough for several days. EXAM: CHEST - 2 VIEW COMPARISON:  Radiographs 08/26/2021.  CT 03/21/2021. FINDINGS: The heart size is stable at the upper limits of normal. The mediastinal contours are normal. Chronic central airway and interstitial thickening appears unchanged. No evidence of edema, confluent airspace opacity, pleural effusion or pneumothorax. No acute osseous findings are evident. There are degenerative changes throughout the spine. IMPRESSION: Stable chronic central airway and interstitial thickening. No acute cardiopulmonary process. Electronically Signed   By: Richardean Sale M.D.   On: 04/30/2022 13:22   DG Knee Complete 4 Views Right  Result Date: 04/30/2022 CLINICAL DATA:  Patient fell last night.  Knee pain. EXAM: RIGHT KNEE - COMPLETE 4+ VIEW COMPARISON:  None Available. FINDINGS: Status post total knee arthroplasty without evidence of hardware loosening, acute fracture or dislocation. The bones are demineralized. There is a small knee joint effusion. No focal soft tissue abnormalities or unexpected foreign bodies are identified. IMPRESSION: No evidence of acute fracture or dislocation post total knee arthroplasty. Small nonspecific knee joint effusion. Electronically Signed   By: Richardean Sale M.D.   On: 04/30/2022 13:21    Pending Labs Unresulted Labs (From admission, onward)     Start     Ordered    05/01/22 0500  Heparin level (unfractionated)  Daily at 5am,   R     See Hyperspace  for full Linked Orders Report.   04/30/22 1835   05/01/22 0500  CBC  Daily at 5am,   R     See Hyperspace for full Linked Orders Report.   04/30/22 1835   05/01/22 0500  Hemoglobin A1c  Tomorrow morning,   R       Comments: To assess prior glycemic control    04/30/22 2020   05/01/22 0500  Culture, blood (Routine X 2) w Reflex to ID Panel  BLOOD CULTURE X 2,   R      04/30/22 2308   05/01/22 0500  CBC with Differential/Platelet  Daily at 5am,   R      04/30/22 2310   05/01/22 0500  Comprehensive metabolic panel  Daily at 5am,   R      04/30/22 2310   05/01/22 0500  C-reactive protein  Daily at 5am,   R      04/30/22 2310   05/01/22 0500  D-dimer, quantitative  Daily at 5am,   R      04/30/22 2310   05/01/22 0500  Magnesium  Daily at 5am,   R      04/30/22 2310            Vitals/Pain Today's Vitals   05/01/22 0315 05/01/22 0330 05/01/22 0345 05/01/22 0449  BP: 124/62  105/68 114/78  Pulse: 84  72 86  Resp: (!) 23  (!) 21 20  Temp:   99.2 F (37.3 C) 98.2 F (36.8 C)  TempSrc:    Oral  SpO2: 92%  91% 96%  Weight:      PainSc:  Asleep  Asleep    Isolation Precautions Airborne and Contact precautions  Medications Medications  ipratropium-albuterol (DUONEB) 0.5-2.5 (3) MG/3ML nebulizer solution 3 mL (3 mLs Nebulization Patient Refused/Not Given 04/30/22 1937)  diltiazem (CARDIZEM) 1 mg/mL load via infusion 10 mg (10 mg Intravenous Bolus from Bag 04/30/22 1809)    And  diltiazem (CARDIZEM) 125 mg in dextrose 5% 125 mL (1 mg/mL) infusion (12.5 mg/hr Intravenous New Bag/Given 05/01/22 0323)  dofetilide (TIKOSYN) capsule 250 mcg (250 mcg Oral Given 04/30/22 2244)  heparin ADULT infusion 100 units/mL (25000 units/2101m) (1,300 Units/hr Intravenous New Bag/Given 04/30/22 2122)  allopurinol (ZYLOPRIM) tablet 150 mg (150 mg Oral Given 04/30/22 2130)  furosemide (LASIX) tablet 40 mg (40 mg Oral Not  Given 04/30/22 2259)  losartan (COZAAR) tablet 25 mg (has no administration in time range)  metoprolol tartrate (LOPRESSOR) tablet 25 mg (25 mg Oral Given 04/30/22 2244)  rosuvastatin (CRESTOR) tablet 5 mg (has no administration in time range)  insulin aspart (novoLOG) injection 0-15 Units ( Subcutaneous Not Given 04/30/22 2359)  sodium chloride flush (NS) 0.9 % injection 3 mL (3 mLs Intravenous Given 04/30/22 2133)  acetaminophen (TYLENOL) tablet 650 mg (has no administration in time range)    Or  acetaminophen (TYLENOL) suppository 650 mg (has no administration in time range)  polyethylene glycol (MIRALAX / GLYCOLAX) packet 17 g (has no administration in time range)  ondansetron (ZOFRAN) tablet 4 mg (has no administration in time range)    Or  ondansetron (ZOFRAN) injection 4 mg (has no administration in time range)  hydrALAZINE (APRESOLINE) injection 10 mg (has no administration in time range)  pantoprazole (PROTONIX) EC tablet 40 mg (40 mg Oral Given 04/30/22 2130)  albuterol (VENTOLIN HFA) 108 (90 Base) MCG/ACT inhaler 2 puff (2 puffs Inhalation Given 05/01/22 0220)  albuterol (VENTOLIN HFA) 108 (90 Base) MCG/ACT inhaler 2 puff (has no  administration in time range)  guaiFENesin-dextromethorphan (ROBITUSSIN DM) 100-10 MG/5ML syrup 10 mL (has no administration in time range)  ascorbic acid (VITAMIN C) tablet 500 mg (has no administration in time range)  zinc sulfate capsule 220 mg (has no administration in time range)  molnupiravir EUA (LAGEVRIO) capsule 800 mg (800 mg Oral Not Given 05/01/22 0012)  fentaNYL (SUBLIMAZE) injection 50 mcg (50 mcg Intravenous Given 04/30/22 1522)  furosemide (LASIX) injection 40 mg (40 mg Intravenous Given 04/30/22 1530)    Mobility: Walker     R Recommendations: See Admitting Provider Note

## 2022-05-02 ENCOUNTER — Ambulatory Visit (HOSPITAL_COMMUNITY): Payer: Medicare Other | Admitting: Nurse Practitioner

## 2022-05-02 DIAGNOSIS — R079 Chest pain, unspecified: Secondary | ICD-10-CM | POA: Diagnosis not present

## 2022-05-02 DIAGNOSIS — I5032 Chronic diastolic (congestive) heart failure: Secondary | ICD-10-CM | POA: Diagnosis not present

## 2022-05-02 DIAGNOSIS — U071 COVID-19: Secondary | ICD-10-CM | POA: Diagnosis not present

## 2022-05-02 DIAGNOSIS — I4891 Unspecified atrial fibrillation: Secondary | ICD-10-CM | POA: Diagnosis not present

## 2022-05-02 LAB — BLOOD CULTURE ID PANEL (REFLEXED) - BCID2

## 2022-05-02 LAB — CBC WITH DIFFERENTIAL/PLATELET
Abs Immature Granulocytes: 0.05 10*3/uL (ref 0.00–0.07)
Basophils Absolute: 0 10*3/uL (ref 0.0–0.1)
Basophils Relative: 0 %
Eosinophils Absolute: 0 10*3/uL (ref 0.0–0.5)
Eosinophils Relative: 0 %
HCT: 37.2 % (ref 36.0–46.0)
Hemoglobin: 12.2 g/dL (ref 12.0–15.0)
Immature Granulocytes: 1 %
Lymphocytes Relative: 13 %
Lymphs Abs: 0.8 10*3/uL (ref 0.7–4.0)
MCH: 28.1 pg (ref 26.0–34.0)
MCHC: 32.8 g/dL (ref 30.0–36.0)
MCV: 85.7 fL (ref 80.0–100.0)
Monocytes Absolute: 0.1 10*3/uL (ref 0.1–1.0)
Monocytes Relative: 2 %
Neutro Abs: 4.8 10*3/uL (ref 1.7–7.7)
Neutrophils Relative %: 84 %
Platelets: 116 10*3/uL — ABNORMAL LOW (ref 150–400)
RBC: 4.34 MIL/uL (ref 3.87–5.11)
RDW: 16.7 % — ABNORMAL HIGH (ref 11.5–15.5)
WBC: 5.7 10*3/uL (ref 4.0–10.5)
nRBC: 0 % (ref 0.0–0.2)

## 2022-05-02 LAB — GLUCOSE, CAPILLARY
Glucose-Capillary: 172 mg/dL — ABNORMAL HIGH (ref 70–99)
Glucose-Capillary: 238 mg/dL — ABNORMAL HIGH (ref 70–99)
Glucose-Capillary: 233 mg/dL — ABNORMAL HIGH (ref 70–99)
Glucose-Capillary: 230 mg/dL — ABNORMAL HIGH (ref 70–99)
Glucose-Capillary: 238 mg/dL — ABNORMAL HIGH (ref 70–99)
Glucose-Capillary: 262 mg/dL — ABNORMAL HIGH (ref 70–99)

## 2022-05-02 LAB — COMPREHENSIVE METABOLIC PANEL
ALT: 16 U/L (ref 0–44)
AST: 39 U/L (ref 15–41)
Albumin: 2.7 g/dL — ABNORMAL LOW (ref 3.5–5.0)
Alkaline Phosphatase: 40 U/L (ref 38–126)
Anion gap: 14 (ref 5–15)
BUN: 22 mg/dL (ref 8–23)
CO2: 23 mmol/L (ref 22–32)
Calcium: 8.8 mg/dL — ABNORMAL LOW (ref 8.9–10.3)
Chloride: 99 mmol/L (ref 98–111)
Creatinine, Ser: 0.74 mg/dL (ref 0.44–1.00)
GFR, Estimated: >60 mL/min (ref >60)
Glucose, Bld: 181 mg/dL — ABNORMAL HIGH (ref 70–99)
Potassium: 3.2 mmol/L — ABNORMAL LOW (ref 3.5–5.1)
Sodium: 136 mmol/L (ref 135–145)
Total Bilirubin: 0.6 mg/dL (ref 0.3–1.2)
Total Protein: 6.5 g/dL (ref 6.5–8.1)

## 2022-05-02 LAB — D-DIMER, QUANTITATIVE: D-Dimer, Quant: 0.6 ug/mL-FEU — ABNORMAL HIGH (ref 0.00–0.50)

## 2022-05-02 LAB — PROTIME-INR
INR: 2 — ABNORMAL HIGH (ref 0.8–1.2)
Prothrombin Time: 22.7 seconds — ABNORMAL HIGH (ref 11.4–15.2)

## 2022-05-02 LAB — MAGNESIUM: Magnesium: 1.7 mg/dL (ref 1.7–2.4)

## 2022-05-02 LAB — C-REACTIVE PROTEIN: CRP: 11.1 mg/dL — ABNORMAL HIGH (ref <1.0)

## 2022-05-02 LAB — HEPARIN LEVEL (UNFRACTIONATED): Heparin Unfractionated: >1.1 IU/mL — ABNORMAL HIGH (ref 0.30–0.70)

## 2022-05-02 MED ORDER — POTASSIUM CHLORIDE CRYS ER 20 MEQ PO TBCR
40.0000 meq | EXTENDED_RELEASE_TABLET | ORAL | Status: AC
  Administered 2022-05-02 (×2): 40 meq via ORAL
  Filled 2022-05-02 (×2): qty 2

## 2022-05-02 MED ORDER — MAGNESIUM SULFATE 2 GM/50ML IV SOLN
2.0000 g | Freq: Once | INTRAVENOUS | Status: AC
Start: 2022-05-02 — End: 2022-05-02
  Administered 2022-05-02: 2 g via INTRAVENOUS
  Filled 2022-05-02: qty 50

## 2022-05-02 MED ORDER — WARFARIN SODIUM 5 MG PO TABS
5.0000 mg | ORAL_TABLET | Freq: Once | ORAL | Status: AC
  Administered 2022-05-02: 5 mg via ORAL
  Filled 2022-05-02: qty 1

## 2022-05-02 MED ORDER — INSULIN ASPART 100 UNIT/ML IJ SOLN
3.0000 [IU] | Freq: Three times a day (TID) | INTRAMUSCULAR | Status: DC
  Administered 2022-05-02 – 2022-05-05 (×9): 3 [IU] via SUBCUTANEOUS

## 2022-05-02 NOTE — Progress Notes (Signed)
[place] for heparin/warfarin Indication: atrial fibrillation  Allergies  Allergen Reactions   Bactrim [Sulfamethoxazole-Trimethoprim]     Drop in BP   Morphine And Related Other (See Comments)    Unresponsive-Very bad reaction.  Can take hydrocodone.    Oysters [Shellfish Allergy] Shortness Of Breath, Itching, Swelling and Rash   Amlodipine Swelling and Other (See Comments)    Of legs   Percocet [Oxycodone-Acetaminophen] Other (See Comments)    hallucinations   Tdap [Tetanus-Diphth-Acell Pertussis] Itching, Swelling and Rash   Tetanus Toxoids Itching, Swelling and Rash   Tramadol Swelling and Other (See Comments)    Of legs   Carbamazepine Other (See Comments)   Lactose Intolerance (Gi)    Macrodantin [Nitrofurantoin] Other (See Comments)   Other Other (See Comments)   Tetanus-Diphtheria Toxoids Td Other (See Comments)   Topiramate Other (See Comments)   Diovan [Valsartan] Other (See Comments)    Cough   Diphtheria Toxoid Itching, Swelling and Rash   Macrodantin [Nitrofurantoin Macrocrystal] Other (See Comments)    unspecified    Patient Measurements: Height: 5' 3"  (160 cm) Weight: 121.1 kg (266 lb 15.6 oz) IBW/kg (Calculated) : 52.4 Heparin Dosing Weight: 84kg  Vital Signs: Temp: 98.3 F (36.8 C) (07/20 0801) Temp Source: Oral (07/20 0801) BP: 119/73 (07/20 0801) Pulse Rate: 97 (07/20 0801)  Labs: Recent Labs    04/30/22 1311 04/30/22 1421 04/30/22 1625 05/01/22 0657 05/01/22 2115 05/02/22 0702  HGB 13.1  --   --   --   --  12.2  HCT 42.5  --   --   --   --  37.2  PLT 119*  --   --   --   --  116*  LABPROT  --  20.2*  --   --  22.7* 22.7*  INR  --  1.8*  --   --  2.0* 2.0*  HEPARINUNFRC  --   --   --  0.25* 0.88* >1.10*  CREATININE 0.81  --   --   --   --  0.74  TROPONINIHS 17  --  16  --   --   --      Estimated Creatinine Clearance: 70.7 mL/min (by C-G formula based on SCr of 0.74 mg/dL).   Assessment: 69  YOF presenting with generalized weakness with intermittent chest pain, in afib, on warfarin PTA with INR 1.8 on admission (last dose 7/17). Pt started on IV heparin upon admission. Pharmacy consulted to restart coumadin 7/19. PTA dosing: 52m daily except 2.544mMondays  INR now up slightly to 2 (at low end of therapeutic). Heparin level up to 1.10 this AM (supratherapeutic), discontinued heparin 7/20 due to therapeutic INR. Warfarin 5 mg x1 given evening of 7/19. No bleeding noted.   Goal of Therapy:  INR 2-3 Monitor platelets by anticoagulation protocol: Yes   Plan:  Resume warfarin 5 mg daily  Monitor daily INR, CBC, s/sx of bleeding   CaFrancena HanlyPharmD Pharmacy Resident  05/02/2022 9:39 AM

## 2022-05-02 NOTE — Inpatient Diabetes Management (Signed)
Inpatient Diabetes Program Recommendations  AACE/ADA: New Consensus Statement on Inpatient Glycemic Control (2015)  Target Ranges:  Prepandial:   less than 140 mg/dL      Peak postprandial:   less than 180 mg/dL (1-2 hours)      Critically ill patients:  140 - 180 mg/dL    Latest Reference Range & Units 05/02/22 08:02 05/02/22 11:34 05/02/22 12:55  Glucose-Capillary 70 - 99 mg/dL 172 (H) 238 (H) 233 (H)    Latest Reference Range & Units 05/01/22 09:24 05/01/22 12:06 05/01/22 16:13 05/01/22 21:17  Glucose-Capillary 70 - 99 mg/dL 131 (H) 100 (H) 153 (H) 223 (H)   Review of Glycemic Control  Diabetes history: DM2 Outpatient Diabetes medications: Metformin 500 mg BID Current orders for Inpatient glycemic control: Novolog 0-15 units TID with meals and at HS; Solumedrol 125 mg Q12H; change to Prednisone 50 mg daily on 05/05/22  Inpatient Diabetes Program Recommendations:    Insulin: If steroids are continued, please consider ordering Novolog 3 units TID with meals for meal coverage if patient eats at least 50% of meals.  Thanks, Barnie Alderman, RN, MSN, [place] Diabetes Coordinator Inpatient Diabetes Program [phone] (Team Pager from 8am to [place])

## 2022-05-02 NOTE — Evaluation (Signed)
Occupational Therapy Evaluation Patient Details Name: Natasha Glass MRN: [id number] DOB: 24-Feb-1941 Today's Date: 05/02/2022   History of Present Illness 81 y.o. female presents to [facility] hospital on 04/30/2022 with episodic weakness and lightheadedness. Pt experienced a fall on 04/29/2022 prior to admission. PMH inlcudes CHF, HTN, DMII, gout, DVT/PE, OSA, PAF.   Clinical Impression   Patient admitted for the diagnosis above.  PTA she lives at home, and has the needed DME and AD to complete ADL and toileting with compensatory techniques given body habitus.  Patient typically walks with a RW due to chronic R knee pain.  Poor activity tolerance,  generalized weakness are the barriers.  OT will follow in the acute setting, and if she progresses, home with [facility] is possible.  Otherwise a short rehab stay at a local SNF prior to returning home is recommended.       Recommendations for follow up therapy are one component of a multi-disciplinary discharge planning process, led by the attending physician.  Recommendations may be updated based on patient status, additional functional criteria and insurance authorization.   Follow Up Recommendations  Follow physician's recommendations for discharge plan and follow up therapies    Assistance Recommended at Discharge Frequent or constant Supervision/Assistance  Patient can return home with the following A little help with walking and/or transfers;A little help with bathing/dressing/bathroom;Help with stairs or ramp for entrance;Assist for transportation;Assistance with cooking/housework    Functional Status Assessment  Patient has had a recent decline in their functional status and demonstrates the ability to make significant improvements in function in a reasonable and predictable amount of time.  Equipment Recommendations  None recommended by OT    Recommendations for Other Services       Precautions / Restrictions Precautions Precautions:  Fall Restrictions Weight Bearing Restrictions: No      Mobility Bed Mobility Overal bed mobility: Needs Assistance Bed Mobility: Supine to Sit, Sit to Supine     Supine to sit: Min assist, HOB elevated Sit to supine: Mod assist     Patient Response: Cooperative  Transfers Overall transfer level: Needs assistance Equipment used: Rolling walker (2 wheels) Transfers: Sit to/from Stand Sit to Stand: Min assist, Mod assist, From elevated surface                  Balance Overall balance assessment: Needs assistance Sitting-balance support: No upper extremity supported, Feet supported Sitting balance-Leahy Scale: Fair     Standing balance support: Bilateral upper extremity supported, Reliant on assistive device for balance Standing balance-Leahy Scale: Poor                             ADL either performed or assessed with clinical judgement   ADL Overall ADL's : Needs assistance/impaired     Grooming: Wash/dry hands;Wash/dry face;Set up;Sitting   Upper Body Bathing: Minimal assistance;Sitting   Lower Body Bathing: Moderate assistance;Sit to/from stand   Upper Body Dressing : Minimal assistance;Sitting   Lower Body Dressing: Moderate assistance;Maximal assistance;Sit to/from stand   Toilet Transfer: Moderate assistance;Regular Toilet;Rolling walker (2 wheels)   Toileting- Clothing Manipulation and Hygiene: Moderate assistance;Sit to/from stand               Vision Patient Visual Report: No change from baseline       Perception Perception Perception: Not tested   Praxis Praxis Praxis: Not tested    Pertinent Vitals/Pain Pain Assessment Pain Assessment: Faces Faces Pain Scale: Hurts a  little bit Pain Location: R knee Pain Descriptors / Indicators: Aching Pain Intervention(s): Monitored during session     Hand Dominance Right   Extremity/Trunk Assessment Upper Extremity Assessment Upper Extremity Assessment: Generalized  weakness   Lower Extremity Assessment Lower Extremity Assessment: Defer to PT evaluation   Cervical / Trunk Assessment Cervical / Trunk Assessment: Other exceptions Cervical / Trunk Exceptions: body habitus   Communication Communication Communication: No difficulties   Cognition Arousal/Alertness: Awake/alert Behavior During Therapy: WFL for tasks assessed/performed Overall Cognitive Status: Within Functional Limits for tasks assessed                                       General Comments   Watch O2 and HR with mobility.      Exercises     Shoulder Instructions      Home Living Family/patient expects to be discharged to:: Private residence Living Arrangements: Alone Available Help at Discharge: Family;Neighbor;Available PRN/intermittently Type of Home: House Home Access: Stairs to enter [employer] of Steps: 2 Entrance Stairs-Rails: Can reach both Home Layout: One level     Bathroom Shower/Tub: Occupational psychologist: Handicapped height     Home Equipment: Rollator (4 wheels);Grab bars - tub/shower;Shower seat - built in;BSC/3in1;Cane - quad;Cane - single point;Hospital bed          Prior Functioning/Environment Prior Level of Function : Driving;Independent/Modified Independent             Mobility Comments: ambulates with rollator ADLs Comments: Patient reports independence w/ ADL's, has AE to reach her feet.        OT Problem List: Decreased activity tolerance;Impaired balance (sitting and/or standing);Decreased strength      OT Treatment/Interventions: Critcher-care/ADL training;Patient/family education;Balance training;Therapeutic activities;DME and/or AE instruction;Energy conservation    OT Goals(Current goals can be found in the care plan section) Acute Rehab OT Goals Patient Stated Goal: Return home OT Goal Formulation: With patient Time For Goal Achievement: 05/16/22 Potential to Achieve Goals: Good ADL  Goals Pt Will Perform Grooming: with supervision;standing Pt Will Perform Upper Body Dressing: with set-up;sitting Pt Will Perform Lower Body Dressing: with supervision;sit to/from stand Pt Will Transfer to Toilet: with supervision;ambulating;regular height toilet  OT Frequency: Min 2X/week    Co-evaluation              AM-PAC OT "6 Clicks" Daily Activity     Outcome Measure Help from another person eating meals?: None Help from another person taking care of personal grooming?: None Help from another person toileting, which includes using toliet, bedpan, or urinal?: A Lot Help from another person bathing (including washing, rinsing, drying)?: A Lot Help from another person to put on and taking off regular upper body clothing?: A Lot Help from another person to put on and taking off regular lower body clothing?: A Lot 6 Click Score: 16   End of Session Equipment Utilized During Treatment: Rolling walker (2 wheels) Nurse Communication: Mobility status;Other (comment) (Recliner broken, may need wider BSC)  Activity Tolerance: Patient tolerated treatment well Patient left: in bed;with call bell/phone within reach  OT Visit Diagnosis: Unsteadiness on feet (R26.81);Muscle weakness (generalized) (M62.81)                Time: 1530-1600 OT Time Calculation (min): 30 min Charges:  OT General Charges $OT Visit: 1 Visit OT Evaluation $OT Eval Moderate Complexity: 1 Mod  05/02/2022  RP, OTR/L  Acute Rehabilitation Services  Office:  [phone]   Metta Clines 05/02/2022, 4:12 PM

## 2022-05-02 NOTE — Progress Notes (Signed)
PHARMACY - PHYSICIAN COMMUNICATION CRITICAL VALUE ALERT - BLOOD CULTURE IDENTIFICATION (BCID)  Natasha Glass is an 81 y.o. female who presented to [facility] on 04/30/2022 with a chief complaint of Afib and fall.  Assessment:  Admitted for Afib w/ RVR, found to be Covid positive, now growing coag-negative Staph in 1 of 2 blood cx bottles, likely a contaminant.  Name of physician (or Provider) ContactedWallie Char MD  Current antibiotics: None  Changes to prescribed antibiotics recommended:  No ABX necessary at this time; if clinical picture changes to suspect bacterial infection would recommend vancomycin.  Results for orders placed or performed during the hospital encounter of 04/30/22  Blood Culture ID Panel (Reflexed) (Collected: 05/01/2022  6:57 AM)  Result Value Ref Range   Enterococcus faecalis NOT DETECTED NOT DETECTED   Enterococcus Faecium NOT DETECTED NOT DETECTED   Listeria monocytogenes NOT DETECTED NOT DETECTED   Staphylococcus species DETECTED (A) NOT DETECTED   Staphylococcus aureus (BCID) NOT DETECTED NOT DETECTED   Staphylococcus epidermidis NOT DETECTED NOT DETECTED   Staphylococcus lugdunensis NOT DETECTED NOT DETECTED   Streptococcus species NOT DETECTED NOT DETECTED   Streptococcus agalactiae NOT DETECTED NOT DETECTED   Streptococcus pneumoniae NOT DETECTED NOT DETECTED   Streptococcus pyogenes NOT DETECTED NOT DETECTED   A.calcoaceticus-baumannii NOT DETECTED NOT DETECTED   Bacteroides fragilis NOT DETECTED NOT DETECTED   Enterobacterales NOT DETECTED NOT DETECTED   Enterobacter cloacae complex NOT DETECTED NOT DETECTED   Escherichia coli NOT DETECTED NOT DETECTED   Klebsiella aerogenes NOT DETECTED NOT DETECTED   Klebsiella oxytoca NOT DETECTED NOT DETECTED   Klebsiella pneumoniae NOT DETECTED NOT DETECTED   Proteus species NOT DETECTED NOT DETECTED   Salmonella species NOT DETECTED NOT DETECTED   Serratia marcescens NOT DETECTED NOT DETECTED    Haemophilus influenzae NOT DETECTED NOT DETECTED   Neisseria meningitidis NOT DETECTED NOT DETECTED   Pseudomonas aeruginosa NOT DETECTED NOT DETECTED   Stenotrophomonas maltophilia NOT DETECTED NOT DETECTED   Candida albicans NOT DETECTED NOT DETECTED   Candida auris NOT DETECTED NOT DETECTED   Candida glabrata NOT DETECTED NOT DETECTED   Candida krusei NOT DETECTED NOT DETECTED   Candida parapsilosis NOT DETECTED NOT DETECTED   Candida tropicalis NOT DETECTED NOT DETECTED   Cryptococcus neoformans/gattii NOT DETECTED NOT DETECTED    Wynona Neat, PharmD, BCPS  05/02/2022  4:06 AM

## 2022-05-02 NOTE — TOC Progression Note (Addendum)
Transition of Care [facility]) - Progression Note    Patient Details  Name: Natasha Glass MRN: [id number] Date of Birth: 07/07/1941  Transition of Care [facility]) CM/SW [place], [place] Phone Number: 05/02/2022, 4:25 PM  Clinical Narrative:     Patient now declining SNF. Patients goal is to work more with physical therapy and go home with home health services when medically ready for dc. CM informed. TOC will continue to follow.   Expected Discharge Plan: [place] Barriers to Discharge: Continued Medical Work up  Expected Discharge Plan and Services Expected Discharge Plan: [place] In-house Referral: Clinical Social Work     Living arrangements for the past 2 months: Single Family Home                                       Social Determinants of Health (SDOH) Interventions    Readmission Risk Interventions    08/30/2021   12:37 PM  Readmission Risk Prevention Plan  Medication Screening Complete  Transportation Screening Complete

## 2022-05-02 NOTE — Plan of Care (Signed)
  Problem: Education: Goal: Knowledge of disease or condition will improve Outcome: Progressing   Problem: Education: Goal: Understanding of medication regimen will improve Outcome: Progressing   Problem: Cardiac: Goal: Ability to achieve and maintain adequate cardiopulmonary perfusion will improve Outcome: Progressing   Problem: Health Behavior/Discharge Planning: Goal: Ability to identify and utilize available resources and services will improve Outcome: Progressing Goal: Ability to manage health-related needs will improve Outcome: Progressing   Problem: Clinical Measurements: Goal: Respiratory complications will improve Outcome: Progressing   Problem: Activity: Goal: Risk for activity intolerance will decrease Outcome: Progressing   Problem: Pain Managment: Goal: General experience of comfort will improve Outcome: Progressing   Problem: Safety: Goal: Ability to remain free from injury will improve Outcome: Progressing   Problem: Skin Integrity: Goal: Risk for impaired skin integrity will decrease Outcome: Progressing

## 2022-05-02 NOTE — Progress Notes (Signed)
PROGRESS NOTE  Natasha Glass VZS:[id number] DOB: May 17, 1941 DOA: 04/30/2022 PCP: Josetta Huddle, MD  HPI/Recap of past 52 hours: 81 year old female with a past medical history of diastolic congestive heart failure, hypertension, non-insulin-dependent diabetes mellitus type 2, gout, prior history of DVT/PE, obstructive sleep apnea on CPAP and paroxysmal atrial fibrillation (On Warfarin/Tikosyn) and gastroesophageal reflux disease who presented to [facility] emergency department with complaints of episodic weakness and lightheadedness, because of patient's legs being so weak and unable to bear weight and falling to the floor.  Patient has also noted nonproductive cough.  In the ED found to be in A-fib with RVR as well as positive for COVID.  Cardiology consulted.    Today, patient denies any new complaints.  Still mildly hypoxic but notes improvement.  Noted using incentive spirometry, patient encouraged    Assessment/Plan: Principal Problem:   Atrial fibrillation with rapid ventricular response (HCC) Active Problems:   COVID-19 virus infection   Fall at home, initial encounter   Chronic diastolic (congestive) heart failure (HCC)   Chest pain   Type 2 diabetes mellitus without complication, without long-term current use of insulin (HCC)   Essential hypertension   Mixed diabetic hyperlipidemia associated with type 2 diabetes mellitus (HCC)   OSA on CPAP   GERD without esophagitis   Gout   Atrial fibrillation with RVR (HCC)   Paroxysmal atrial fibrillation with rapid ventricular response (HCC) Likely in the setting of COVID infection Heart rate with better control Echo showed EF of 70 to 75%, no regional wall motion abnormalities, left ventricular diastolic parameters are indeterminate Cardiology on board, continue home dose of Tikosyn, increase metoprolol, weaned off Cardizem drip If HR becomes uncontrolled again, may need cardioversion after she recovers from [place] she  will also need TEE prior to cardioversion D/C heparin infusion, restart PTA warfarin (pharmacy to dose) Telemetry   COVID-19 virus infection Acute hypoxic respiratory failure Currently saturating around 96% on 2 L of oxygen, plan to wean off Chest x-ray unremarkable BC x2, urine coag negative staph in 1 bottle, and gram-positive rod in the other sets, both likely contaminants.  Repeat BC x2 pending CRP elevated, will trend Presence of hypoxia, initiated Solu-Medrol, followed by prednisone Continue 5-day course of Paxlovid Continue bronchodilators, cough suppressants Continue to trend inflammatory markers Monitor closely  Hypokalemia Replace as needed  Chest pain Episodes of atypical chest discomfort likely related to rapid atrial fibrillation Serial cardiac enzymes here are unremarkable No evidence of dynamic ST segment change on EKG Telemetry  Fall at home, initial encounter Weakness likely secondary to COVID-19 infection with bouts of rapid atrial fibrillation Complains of right knee pain have been evaluated with both x-ray and CT imaging of the right knee which revealed no evidence of damage to the hardware or bony injury PT/OT-recommend SNF Fall precautions  Chronic diastolic (congestive) heart failure (HCC) No clinical evidence of cardiogenic volume overload Continue home Lasix Strict I's and O's, daily weights   Type 2 diabetes mellitus without complication, without long-term current use of insulin (HCC) A1c 6.4 SSI, Accu-Cheks, hypoglycemic protocol  Essential hypertension BP stable Monitor BP closely   Mixed diabetic hyperlipidemia associated with type 2 diabetes mellitus ([place]) Continuing home regimen of lipid lowering therapy.   OSA on CPAP Continuing home regimen of CPAP nightly   GERD without esophagitis Protonix 40 mg daily.   Gout Continuing home regimen of allopurinol   Morbid obesity Lifestyle modification advised   Estimated body mass index  is 47.29 kg/m  as calculated from the following:   Height as of this encounter: 5' 3"  (1.6 m).   Weight as of this encounter: 121.1 kg.     Code Status: Full  Family Communication: None at bedside  Disposition Plan: Status is: Inpatient The patient will require care spanning > 2 midnights and should be moved to inpatient because: Level of care    Consultants: Cardiology  Procedures: None  Antimicrobials: None  DVT prophylaxis: Coumadin   Objective: Vitals:   05/02/22 0328 05/02/22 0801 05/02/22 1254 05/02/22 1642  BP: 112/83 119/73 137/88 129/85  Pulse: 92 97 94 95  Resp: 19 19 (!) 22 18  Temp: 98.7 F (37.1 C) 98.3 F (36.8 C) 98.3 F (36.8 C) 98 F (36.7 C)  TempSrc: Oral Oral Axillary Oral  SpO2: 97% 96% 96% 93%  Weight: 121.1 kg     Height:        Intake/Output Summary (Last 24 hours) at 05/02/2022 1753 Last data filed at 05/02/2022 1500 Gross per 24 hour  Intake 540.32 ml  Output 1100 ml  Net -559.68 ml   Filed Weights   04/30/22 1810 05/01/22 0615 05/02/22 0328  Weight: 126.6 kg 127.4 kg 121.1 kg    Exam: General: NAD  Cardiovascular: S1, S2 present Respiratory: CTAB Abdomen: Soft, nontender, nondistended, bowel sounds present Musculoskeletal: Trace bilateral pedal edema noted Skin: Normal Psychiatry: Normal mood      Data Reviewed: CBC: Recent Labs  Lab 04/30/22 1311 05/02/22 0702  WBC 7.9 5.7  NEUTROABS  --  4.8  HGB 13.1 12.2  HCT 42.5 37.2  MCV 89.9 85.7  PLT 119* 258*   Basic Metabolic Panel: Recent Labs  Lab 04/30/22 1311 05/02/22 0702  NA 136 136  K 4.1 3.2*  CL 100 99  CO2 24 23  GLUCOSE 137* 181*  BUN 19 22  CREATININE 0.81 0.74  CALCIUM 9.4 8.8*  MG  --  1.7   GFR: Estimated Creatinine Clearance: 70.7 mL/min (by C-G formula based on SCr of 0.74 mg/dL). Liver Function Tests: Recent Labs  Lab 04/30/22 1311 05/02/22 0702  AST 54* 39  ALT 17 16  ALKPHOS 50 40  BILITOT 0.6 0.6  PROT 7.5 6.5  ALBUMIN  3.6 2.7*   No results for input(s): "LIPASE", "AMYLASE" in the last 168 hours. No results for input(s): "AMMONIA" in the last 168 hours. Coagulation Profile: Recent Labs  Lab 04/30/22 1421 05/01/22 2115 05/02/22 0702  INR 1.8* 2.0* 2.0*   Cardiac Enzymes: No results for input(s): "CKTOTAL", "CKMB", "CKMBINDEX", "TROPONINI" in the last 168 hours. BNP (last 3 results) No results for input(s): "PROBNP" in the last 8760 hours. HbA1C: Recent Labs    05/01/22 0657  HGBA1C 6.4*   CBG: Recent Labs  Lab 05/01/22 1613 05/01/22 2117 05/02/22 0802 05/02/22 1134 05/02/22 1255  GLUCAP 153* 223* 172* 238* 233*   Lipid Profile: No results for input(s): "CHOL", "HDL", "LDLCALC", "TRIG", "CHOLHDL", "LDLDIRECT" in the last 72 hours. Thyroid Function Tests: Recent Labs    05/01/22 0657  TSH 2.121   Anemia Panel: No results for input(s): "VITAMINB12", "FOLATE", "FERRITIN", "TIBC", "IRON", "RETICCTPCT" in the last 72 hours. Urine analysis:    Component Value Date/Time   COLORURINE STRAW (A) 04/30/2022 1207   APPEARANCEUR CLEAR 04/30/2022 1207   LABSPEC 1.005 04/30/2022 1207   PHURINE 6.0 04/30/2022 1207   GLUCOSEU NEGATIVE 04/30/2022 1207   HGBUR SMALL (A) 04/30/2022 1207   [place] 04/30/2022 1207   [place] 04/30/2022 1207  PROTEINUR NEGATIVE 04/30/2022 1207   UROBILINOGEN 0.2 01/04/2011 0915   NITRITE NEGATIVE 04/30/2022 1207   LEUKOCYTESUR NEGATIVE 04/30/2022 1207   Sepsis Labs: @LABRCNTIP (procalcitonin:4,lacticidven:4)  ) Recent Results (from the past 240 hour(s))  Resp Panel by RT-PCR (Flu A&B, Covid) Anterior Nasal Swab     Status: Abnormal   Collection Time: 04/30/22  2:51 PM   Specimen: Anterior Nasal Swab  Result Value Ref Range Status   SARS Coronavirus 2 by RT PCR POSITIVE (A) NEGATIVE Final    Comment: (NOTE) SARS-CoV-2 target nucleic acids are DETECTED.  The SARS-CoV-2 RNA is generally detectable in upper respiratory specimens  during the acute phase of infection. Positive results are indicative of the presence of the identified virus, but do not rule out bacterial infection or co-infection with other pathogens not detected by the test. Clinical correlation with patient history and other diagnostic information is necessary to determine patient infection status. The expected result is Negative.  Fact Sheet for Patients: EntrepreneurPulse.com.au  Fact Sheet for Healthcare Providers: IncredibleEmployment.be  This test is not yet approved or cleared by the [place] FDA and  has been authorized for detection and/or diagnosis of SARS-CoV-2 by FDA under an Emergency Use Authorization (EUA).  This EUA will remain in effect (meaning this test can be used) for the duration of  the COVID-19 declaration under Section 564(b)(1) of the A ct, 21 U.S.C. section 360bbb-3(b)(1), unless the authorization is terminated or revoked sooner.     Influenza A by PCR NEGATIVE NEGATIVE Final   Influenza B by PCR NEGATIVE NEGATIVE Final    Comment: (NOTE) The Xpert Xpress SARS-CoV-2/FLU/RSV plus assay is intended as an aid in the diagnosis of influenza from Nasopharyngeal swab specimens and should not be used as a sole basis for treatment. Nasal washings and aspirates are unacceptable for Xpert Xpress SARS-CoV-2/FLU/RSV testing.  Fact Sheet for Patients: EntrepreneurPulse.com.au  Fact Sheet for Healthcare Providers: IncredibleEmployment.be  This test is not yet approved or cleared by the [place] FDA and has been authorized for detection and/or diagnosis of SARS-CoV-2 by FDA under an Emergency Use Authorization (EUA). This EUA will remain in effect (meaning this test can be used) for the duration of the COVID-19 declaration under Section 564(b)(1) of the Act, 21 U.S.C. section 360bbb-3(b)(1), unless the authorization is terminated  or revoked.  Performed at [facility], [place] [address] [place], [place] [address]   Culture, blood (Routine X 2) w Reflex to ID Panel     Status: None (Preliminary result)   Collection Time: 05/01/22  6:57 AM   Specimen: BLOOD LEFT HAND  Result Value Ref Range Status   Specimen Description BLOOD LEFT HAND  Final   Special Requests   Final    BOTTLES DRAWN AEROBIC ONLY Blood Culture adequate volume   Culture  Setup Time   Final    GRAM POSITIVE COCCI AEROBIC BOTTLE ONLY Organism ID to follow CRITICAL RESULT CALLED TO, READ BACK BY AND VERIFIED WITH: V BRYK,[email or web address]  05/02/22 [place]    Culture   Final    NO GROWTH 1 DAY Performed at [facility], [place] [address], [place], [place] [address]    Report Status PENDING  Incomplete  Culture, blood (Routine X 2) w Reflex to ID Panel     Status: None (Preliminary result)   Collection Time: 05/01/22  6:57 AM   Specimen: BLOOD RIGHT HAND  Result Value Ref Range Status   Specimen Description BLOOD RIGHT HAND  Final  Special Requests   Final    BOTTLES DRAWN AEROBIC ONLY Blood Culture adequate volume   Culture  Setup Time   Final    GRAM POSITIVE RODS AEROBIC BOTTLE ONLY CRITICAL RESULT CALLED TO, READ BACK BY AND VERIFIED WITH: G. Aris Lot PHARMD, AT [id number] 05/02/22 D. VANHOOK Performed at [facility], [place] [address] [place], [place] [address]    Culture GRAM POSITIVE RODS  Final   Report Status PENDING  Incomplete  Blood Culture ID Panel (Reflexed)     Status: Abnormal   Collection Time: 05/01/22  6:57 AM  Result Value Ref Range Status   Enterococcus faecalis NOT DETECTED NOT DETECTED Final   Enterococcus Faecium NOT DETECTED NOT DETECTED Final   Listeria monocytogenes NOT DETECTED NOT DETECTED Final   Staphylococcus species DETECTED (A) NOT DETECTED Final    Comment: CRITICAL RESULT CALLED TO, READ BACK BY AND VERIFIED WITH: V BRYK,PHARMD@[email or web address]  05/02/22 [place]    Staphylococcus aureus (BCID) NOT DETECTED NOT DETECTED  Final   Staphylococcus epidermidis NOT DETECTED NOT DETECTED Final   Staphylococcus lugdunensis NOT DETECTED NOT DETECTED Final   Streptococcus species NOT DETECTED NOT DETECTED Final   Streptococcus agalactiae NOT DETECTED NOT DETECTED Final   Streptococcus pneumoniae NOT DETECTED NOT DETECTED Final   Streptococcus pyogenes NOT DETECTED NOT DETECTED Final   A.calcoaceticus-baumannii NOT DETECTED NOT DETECTED Final   Bacteroides fragilis NOT DETECTED NOT DETECTED Final   Enterobacterales NOT DETECTED NOT DETECTED Final   Enterobacter cloacae complex NOT DETECTED NOT DETECTED Final   Escherichia coli NOT DETECTED NOT DETECTED Final   Klebsiella aerogenes NOT DETECTED NOT DETECTED Final   Klebsiella oxytoca NOT DETECTED NOT DETECTED Final   Klebsiella pneumoniae NOT DETECTED NOT DETECTED Final   Proteus species NOT DETECTED NOT DETECTED Final   Salmonella species NOT DETECTED NOT DETECTED Final   Serratia marcescens NOT DETECTED NOT DETECTED Final   Haemophilus influenzae NOT DETECTED NOT DETECTED Final   Neisseria meningitidis NOT DETECTED NOT DETECTED Final   Pseudomonas aeruginosa NOT DETECTED NOT DETECTED Final   Stenotrophomonas maltophilia NOT DETECTED NOT DETECTED Final   Candida albicans NOT DETECTED NOT DETECTED Final   Candida auris NOT DETECTED NOT DETECTED Final   Candida glabrata NOT DETECTED NOT DETECTED Final   Candida krusei NOT DETECTED NOT DETECTED Final   Candida parapsilosis NOT DETECTED NOT DETECTED Final   Candida tropicalis NOT DETECTED NOT DETECTED Final   Cryptococcus neoformans/gattii NOT DETECTED NOT DETECTED Final    Comment: Performed at [facility], [address] [place],  [address]      Studies: No results found.  Scheduled Meds:  allopurinol  150 mg Oral QPM   vitamin C  500 mg Oral Daily   dofetilide  250 mcg Oral BID   furosemide  40 mg Oral Daily   insulin aspart  0-15 Units Subcutaneous TID AC & HS   insulin aspart  3 Units  Subcutaneous TID WC   ipratropium-albuterol  3 mL Nebulization Once   methylPREDNISolone (SOLU-MEDROL) injection  125 mg Intravenous Q12H   metoprolol tartrate  50 mg Oral BID   molnupiravir EUA  4 capsule Oral BID   pantoprazole  40 mg Oral Daily   [START ON 05/05/2022] predniSONE  50 mg Oral Q breakfast   rosuvastatin  5 mg Oral Daily   sodium chloride flush  3 mL Intravenous Q12H   Warfarin - Pharmacist Dosing Inpatient   Does not apply q1600   zinc sulfate  220 mg Oral Daily    Continuous Infusions:  diltiazem (CARDIZEM) infusion Stopped (05/01/22 1430)     LOS: 1 day     Alma Friendly, MD [facility]  If 7PM-7AM, please contact night-coverage [email or web address] 05/02/2022, 5:53 PM

## 2022-05-02 NOTE — Plan of Care (Signed)

## 2022-05-02 NOTE — Progress Notes (Signed)
PHARMACY - PHYSICIAN COMMUNICATION CRITICAL VALUE ALERT - BLOOD CULTURE IDENTIFICATION (BCID)  Natasha Glass is an 81 y.o. female who presented to [facility] on 04/30/2022 with a chief complaint of afib and a fall  Assessment:  Admitted for Afib w/ RVR, found to be Covid positive, now growing coag-negative Staph in 1 of 2 blood cx bottles and a Gram positive rod in the other set  Name of physician (or Provider) Contacted: Dr. Horris Latino notified  Current antibiotics: none   Changes to prescribed antibiotics recommended: Continue to monitor off antibiotics   Results for orders placed or performed during the hospital encounter of 04/30/22  Blood Culture ID Panel (Reflexed) (Collected: 05/01/2022  6:57 AM)  Result Value Ref Range   Enterococcus faecalis NOT DETECTED NOT DETECTED   Enterococcus Faecium NOT DETECTED NOT DETECTED   Listeria monocytogenes NOT DETECTED NOT DETECTED   Staphylococcus species DETECTED (A) NOT DETECTED   Staphylococcus aureus (BCID) NOT DETECTED NOT DETECTED   Staphylococcus epidermidis NOT DETECTED NOT DETECTED   Staphylococcus lugdunensis NOT DETECTED NOT DETECTED   Streptococcus species NOT DETECTED NOT DETECTED   Streptococcus agalactiae NOT DETECTED NOT DETECTED   Streptococcus pneumoniae NOT DETECTED NOT DETECTED   Streptococcus pyogenes NOT DETECTED NOT DETECTED   A.calcoaceticus-baumannii NOT DETECTED NOT DETECTED   Bacteroides fragilis NOT DETECTED NOT DETECTED   Enterobacterales NOT DETECTED NOT DETECTED   Enterobacter cloacae complex NOT DETECTED NOT DETECTED   Escherichia coli NOT DETECTED NOT DETECTED   Klebsiella aerogenes NOT DETECTED NOT DETECTED   Klebsiella oxytoca NOT DETECTED NOT DETECTED   Klebsiella pneumoniae NOT DETECTED NOT DETECTED   Proteus species NOT DETECTED NOT DETECTED   Salmonella species NOT DETECTED NOT DETECTED   Serratia marcescens NOT DETECTED NOT DETECTED   Haemophilus influenzae NOT DETECTED NOT DETECTED   Neisseria  meningitidis NOT DETECTED NOT DETECTED   Pseudomonas aeruginosa NOT DETECTED NOT DETECTED   Stenotrophomonas maltophilia NOT DETECTED NOT DETECTED   Candida albicans NOT DETECTED NOT DETECTED   Candida auris NOT DETECTED NOT DETECTED   Candida glabrata NOT DETECTED NOT DETECTED   Candida krusei NOT DETECTED NOT DETECTED   Candida parapsilosis NOT DETECTED NOT DETECTED   Candida tropicalis NOT DETECTED NOT DETECTED   Cryptococcus neoformans/gattii NOT DETECTED NOT DETECTED    Candie Mile 05/02/2022  8:08 AM

## 2022-05-02 NOTE — Progress Notes (Signed)
   Progress Note  Patient Name: [name] Date of Encounter: 05/02/2022  [facility] Cardiologist: Dr Rayann Heman  Telemetry reviewed.  Patient remains in atrial fibrillation but heart rate is controlled.  Continue metoprolol and Tikosyn.  Wean Cardizem to off if possible.  INR is now 2 and heparin can be discontinued.  As outlined previously plan will be to proceed with cardioversion as an outpatient once patient recovers from COVID assuming atrial fibrillation persists.  Also note INR was subtherapeutic at time of admission.  Therefore if rate is difficult to control or she remains symptomatic and requires cardioversion sooner would need transesophageal echocardiogram prior to proceeding.   For questions or updates, please contact [place] Please consult [email or web address] for contact info under        Signed, Kirk Ruths, MD  05/02/2022, 8:23 AM

## 2022-05-02 NOTE — Progress Notes (Signed)
Physical Therapy Treatment Patient Details Name: Natasha Glass MRN: [id number] DOB: [birth year] Today's Date: 05/02/2022   History of Present Illness 81 y.o. female presents to [facility] hospital on 04/30/2022 with episodic weakness and lightheadedness. Pt experienced a fall on 04/29/2022 prior to admission. PMH inlcudes CHF, HTN, DMII, gout, DVT/PE, OSA, PAF.    PT Comments    Pt with improved tolerance for mobility, although activity tolerance remains limited compared to baseline. Pt continues to require physical assistance for transfers due to LE weakness, and is limited in ambulation distances by increased work of breathing. Pt also continues to require assistance and use of bed rails for all bed mobility. Pt will benefit from aggressive mobilization in an effort to improve mobility quality and reduce falls risk. PT continues to recommend SNF placement as the pt is unable to mobilize without physical assistance and has limited caregiver support.  Recommendations for follow up therapy are one component of a multi-disciplinary discharge planning process, led by the attending physician.  Recommendations may be updated based on patient status, additional functional criteria and insurance authorization.  Follow Up Recommendations  Skilled nursing-short term rehab (<3 hours/day) (may progress to HHPT prior to end of quarantine) Can patient physically be transported by private vehicle: No   Assistance Recommended at Discharge Intermittent Supervision/Assistance  Patient can return home with the following A lot of help with walking and/or transfers;A lot of help with bathing/dressing/bathroom;Assistance with cooking/housework;Help with stairs or ramp for entrance;Assist for transportation   Equipment Recommendations  Wheelchair (measurements PT);Wheelchair cushion (measurements PT)    Recommendations for Other Services       Precautions / Restrictions Precautions Precautions: Fall Precaution  Comments: monitor HR and sats Restrictions Weight Bearing Restrictions: No     Mobility  Bed Mobility Overal bed mobility: Needs Assistance Bed Mobility: Supine to Sit, Sit to Supine     Supine to sit: Min assist, HOB elevated Sit to supine: Mod assist   General bed mobility comments: use of rails, assist for BLE with return to supine    Transfers Overall transfer level: Needs assistance Equipment used: Rolling walker (2 wheels) Transfers: Sit to/from Stand Sit to Stand: Min assist, +2 physical assistance (minA x2 or modA x1)                Ambulation/Gait Ambulation/Gait assistance: Min assist, +2 safety/equipment Gait Distance (Feet): 10 Feet (10' x 2 trials to and from commode) Assistive device: Rolling walker (2 wheels) Gait Pattern/deviations: Step-to pattern, Wide base of support Gait velocity: reduced Gait velocity interpretation: <1.31 ft/sec, indicative of household ambulator   General Gait Details: pt with slowed step-to gait   Stairs             Wheelchair Mobility    Modified Rankin (Stroke Patients Only)       Balance Overall balance assessment: Needs assistance Sitting-balance support: No upper extremity supported, Feet supported Sitting balance-Leahy Scale: Fair     Standing balance support: Bilateral upper extremity supported, Reliant on assistive device for balance Standing balance-Leahy Scale: Poor Standing balance comment: minG with BUE support for static standing                            Cognition Arousal/Alertness: Awake/alert Behavior During Therapy: WFL for tasks assessed/performed Overall Cognitive Status: Within Functional Limits for tasks assessed  Exercises      General Comments General comments (skin integrity, edema, etc.): tachycardia up to 147 with afib noted on monitor. Pt with notable increased work of breathing when mobilizing off  oxygen, sats maintained when on 2L       Pertinent Vitals/Pain Pain Assessment Pain Assessment: Faces Faces Pain Scale: Hurts little more Pain Location: R knee Pain Descriptors / Indicators: Grimacing Pain Intervention(s): Monitored during session    Home Living Family/patient expects to be discharged to:: Private residence Living Arrangements: Alone Available Help at Discharge: Family;Neighbor;Available PRN/intermittently Type of Home: House Home Access: Stairs to enter Entrance Stairs-Rails: Can reach both Entrance Stairs-Number of Steps: 2   Home Layout: One level Home Equipment: Rollator (4 wheels);Grab bars - tub/shower;Shower seat - built in;BSC/3in1;Cane - quad;Cane - single point;Hospital bed      Prior Function            PT Goals (current goals can now be found in the care plan section) Acute Rehab PT Goals Patient Stated Goal: to return to independent mobility Progress towards PT goals: Progressing toward goals    Frequency    Min 3X/week      PT Plan Current plan remains appropriate    Co-evaluation PT/OT/SLP Co-Evaluation/Treatment: Yes Reason for Co-Treatment: For patient/therapist safety;To address functional/ADL transfers PT goals addressed during session: Mobility/safety with mobility;Balance;Proper use of DME;Strengthening/ROM        AM-PAC PT "6 Clicks" Mobility   Outcome Measure  Help needed turning from your back to your side while in a flat bed without using bedrails?: A Little Help needed moving from lying on your back to sitting on the side of a flat bed without using bedrails?: A Lot Help needed moving to and from a bed to a chair (including a wheelchair)?: A Lot Help needed standing up from a chair using your arms (e.g., wheelchair or bedside chair)?: A Lot Help needed to walk in hospital room?: A Little Help needed climbing 3-5 steps with a railing? : Total 6 Click Score: 13    End of Session Equipment Utilized During  Treatment: Oxygen Activity Tolerance: Patient limited by fatigue Patient left: in bed;with call bell/phone within reach;with bed alarm set Nurse Communication: Mobility status PT Visit Diagnosis: Other abnormalities of gait and mobility (R26.89);Muscle weakness (generalized) (M62.81);History of falling (Z91.81)     Time: 1610-9604 PT Time Calculation (min) (ACUTE ONLY): 38 min  Charges:  $Gait Training: 8-22 mins $Therapeutic Activity: 8-22 mins                     [name], PT, DPT Acute Rehabilitation Office [place] 05/02/2022, 5:30 PM

## 2022-05-03 DIAGNOSIS — I4891 Unspecified atrial fibrillation: Secondary | ICD-10-CM | POA: Diagnosis not present

## 2022-05-03 DIAGNOSIS — I10 Essential (primary) hypertension: Secondary | ICD-10-CM | POA: Diagnosis not present

## 2022-05-03 DIAGNOSIS — U071 COVID-19: Secondary | ICD-10-CM | POA: Diagnosis not present

## 2022-05-03 DIAGNOSIS — I5032 Chronic diastolic (congestive) heart failure: Secondary | ICD-10-CM | POA: Diagnosis not present

## 2022-05-03 LAB — CBC WITH DIFFERENTIAL/PLATELET
Abs Immature Granulocytes: 0.07 10*3/uL (ref 0.00–0.07)
Basophils Absolute: 0 10*3/uL (ref 0.0–0.1)
Basophils Relative: 0 %
Eosinophils Absolute: 0 10*3/uL (ref 0.0–0.5)
Eosinophils Relative: 0 %
HCT: 37.6 % (ref 36.0–46.0)
Hemoglobin: 12.1 g/dL (ref 12.0–15.0)
Immature Granulocytes: 1 %
Lymphocytes Relative: 6 %
Lymphs Abs: 0.8 10*3/uL (ref 0.7–4.0)
MCH: 28 pg (ref 26.0–34.0)
MCHC: 32.2 g/dL (ref 30.0–36.0)
MCV: 87 fL (ref 80.0–100.0)
Monocytes Absolute: 0.4 10*3/uL (ref 0.1–1.0)
Monocytes Relative: 3 %
Neutro Abs: 11.5 10*3/uL — ABNORMAL HIGH (ref 1.7–7.7)
Neutrophils Relative %: 90 %
Platelets: 142 10*3/uL — ABNORMAL LOW (ref 150–400)
RBC: 4.32 MIL/uL (ref 3.87–5.11)
RDW: 16.4 % — ABNORMAL HIGH (ref 11.5–15.5)
WBC: 12.8 10*3/uL — ABNORMAL HIGH (ref 4.0–10.5)
nRBC: 0 % (ref 0.0–0.2)

## 2022-05-03 LAB — COMPREHENSIVE METABOLIC PANEL
ALT: 16 U/L (ref 0–44)
AST: 31 U/L (ref 15–41)
Albumin: 2.7 g/dL — ABNORMAL LOW (ref 3.5–5.0)
Alkaline Phosphatase: 39 U/L (ref 38–126)
Anion gap: 7 (ref 5–15)
BUN: 27 mg/dL — ABNORMAL HIGH (ref 8–23)
CO2: 23 mmol/L (ref 22–32)
Calcium: 8.8 mg/dL — ABNORMAL LOW (ref 8.9–10.3)
Chloride: 107 mmol/L (ref 98–111)
Creatinine, Ser: 0.78 mg/dL (ref 0.44–1.00)
GFR, Estimated: >60 mL/min (ref >60)
Glucose, Bld: 175 mg/dL — ABNORMAL HIGH (ref 70–99)
Potassium: 3.6 mmol/L (ref 3.5–5.1)
Sodium: 137 mmol/L (ref 135–145)
Total Bilirubin: 0.6 mg/dL (ref 0.3–1.2)
Total Protein: 6.4 g/dL — ABNORMAL LOW (ref 6.5–8.1)

## 2022-05-03 LAB — CULTURE, BLOOD (ROUTINE X 2): Special Requests: ADEQUATE

## 2022-05-03 LAB — C-REACTIVE PROTEIN: CRP: 7.2 mg/dL — ABNORMAL HIGH (ref <1.0)

## 2022-05-03 LAB — PROTIME-INR
INR: 3 — ABNORMAL HIGH (ref 0.8–1.2)
Prothrombin Time: 30.9 seconds — ABNORMAL HIGH (ref 11.4–15.2)

## 2022-05-03 LAB — GLUCOSE, CAPILLARY
Glucose-Capillary: 196 mg/dL — ABNORMAL HIGH (ref 70–99)
Glucose-Capillary: 231 mg/dL — ABNORMAL HIGH (ref 70–99)
Glucose-Capillary: 245 mg/dL — ABNORMAL HIGH (ref 70–99)
Glucose-Capillary: 266 mg/dL — ABNORMAL HIGH (ref 70–99)

## 2022-05-03 LAB — MAGNESIUM: Magnesium: 2 mg/dL (ref 1.7–2.4)

## 2022-05-03 LAB — D-DIMER, QUANTITATIVE: D-Dimer, Quant: 0.63 ug/mL-FEU — ABNORMAL HIGH (ref 0.00–0.50)

## 2022-05-03 MED ORDER — WARFARIN SODIUM 2.5 MG PO TABS
2.5000 mg | ORAL_TABLET | Freq: Once | ORAL | Status: AC
  Administered 2022-05-03: 2.5 mg via ORAL
  Filled 2022-05-03: qty 1

## 2022-05-03 MED ORDER — POTASSIUM CHLORIDE CRYS ER 20 MEQ PO TBCR
20.0000 meq | EXTENDED_RELEASE_TABLET | Freq: Once | ORAL | Status: AC
  Administered 2022-05-03: 20 meq via ORAL
  Filled 2022-05-03: qty 1

## 2022-05-03 NOTE — Progress Notes (Signed)
[place] for warfarin Indication: atrial fibrillation  Allergies  Allergen Reactions   Bactrim [Sulfamethoxazole-Trimethoprim]     Drop in BP   Morphine And Related Other (See Comments)    Unresponsive-Very bad reaction.  Can take hydrocodone.    Oysters [Shellfish Allergy] Shortness Of Breath, Itching, Swelling and Rash   Amlodipine Swelling and Other (See Comments)    Of legs   Percocet [Oxycodone-Acetaminophen] Other (See Comments)    hallucinations   Tdap [Tetanus-Diphth-Acell Pertussis] Itching, Swelling and Rash   Tetanus Toxoids Itching, Swelling and Rash   Tramadol Swelling and Other (See Comments)    Of legs   Carbamazepine Other (See Comments)   Lactose Intolerance (Gi)    Macrodantin [Nitrofurantoin] Other (See Comments)   Other Other (See Comments)   Tetanus-Diphtheria Toxoids Td Other (See Comments)   Topiramate Other (See Comments)   Diovan [Valsartan] Other (See Comments)    Cough   Diphtheria Toxoid Itching, Swelling and Rash   Macrodantin [Nitrofurantoin Macrocrystal] Other (See Comments)    unspecified    Patient Measurements: Height: 5' 3"  (160 cm) Weight: 122.1 kg (269 lb 2.9 oz) IBW/kg (Calculated) : 52.4 Heparin Dosing Weight: 84kg  Vital Signs: Temp: 97.7 F (36.5 C) (07/21 0427) Temp Source: Oral (07/21 0427) BP: 130/91 (07/21 0013) Pulse Rate: 85 (07/21 0427)  Labs: Recent Labs    04/30/22 1311 04/30/22 1421 04/30/22 1625 05/01/22 0657 05/01/22 2115 05/02/22 0702 05/03/22 0253  HGB 13.1  --   --   --   --  12.2 12.1  HCT 42.5  --   --   --   --  37.2 37.6  PLT 119*  --   --   --   --  116* 142*  LABPROT  --    < >  --   --  22.7* 22.7* 30.9*  INR  --    < >  --   --  2.0* 2.0* 3.0*  HEPARINUNFRC  --   --   --  0.25* 0.88* >1.10*  --   CREATININE 0.81  --   --   --   --  0.74 0.78  TROPONINIHS 17  --  16  --   --   --   --    < > = values in this interval not displayed.     Estimated  Creatinine Clearance: 71.1 mL/min (by C-G formula based on SCr of 0.78 mg/dL).   Assessment: 89 YOF presenting with generalized weakness with intermittent chest pain, in afib, on warfarin PTA with INR 1.8 on admission (last dose 7/17). Pt started on IV heparin upon admission. Pharmacy consulted to restart coumadin 7/19. PTA dosing: 7m daily except 2.566mMondays  INR therapeutic but up to 3 from 2 yesterday. CBC stable. No DDIs noted.  Goal of Therapy:  INR 2-3 Monitor platelets by anticoagulation protocol: Yes   Plan:  Warfarin 2.56m57mO x1 tonight - 50% dose reduction with rapid INR rise Daily protime  MicArrie SenateharmD, BCPS, BCCTrinity Medical Center West-Erinical Pharmacist [phone] check AMION for all [facility] 05/03/2022

## 2022-05-03 NOTE — Progress Notes (Signed)
Progress Note  Patient Name: Natasha Glass Date of Encounter: 05/03/2022  [facility] Cardiologist: Dr Rayann Heman  Subjective   No CP or dyspnea  Inpatient Medications    Scheduled Meds:  allopurinol  150 mg Oral QPM   vitamin C  500 mg Oral Daily   dofetilide  250 mcg Oral BID   furosemide  40 mg Oral Daily   insulin aspart  0-15 Units Subcutaneous TID AC & HS   insulin aspart  3 Units Subcutaneous TID WC   ipratropium-albuterol  3 mL Nebulization Once   methylPREDNISolone (SOLU-MEDROL) injection  125 mg Intravenous Q12H   metoprolol tartrate  50 mg Oral BID   molnupiravir EUA  4 capsule Oral BID   pantoprazole  40 mg Oral Daily   [START ON 05/05/2022] predniSONE  50 mg Oral Q breakfast   rosuvastatin  5 mg Oral Daily   sodium chloride flush  3 mL Intravenous Q12H   warfarin  2.5 mg Oral ONCE-1600   Warfarin - Pharmacist Dosing Inpatient   Does not apply q1600   zinc sulfate  220 mg Oral Daily   Continuous Infusions:  PRN Meds: acetaminophen **OR** acetaminophen, albuterol, guaiFENesin-dextromethorphan, hydrALAZINE, ondansetron **OR** ondansetron (ZOFRAN) IV, polyethylene glycol   Vital Signs    Vitals:   05/02/22 2011 05/03/22 0013 05/03/22 0118 05/03/22 0427  BP: 129/73 (!) 130/91    Pulse: 98 87  85  Resp: (!) 22 17    Temp: 98.3 F (36.8 C) 98.2 F (36.8 C)  97.7 F (36.5 C)  TempSrc: Oral Oral  Oral  SpO2: 96% 92%  95%  Weight:   122.1 kg   Height:        Intake/Output Summary (Last 24 hours) at 05/03/2022 0814 Last data filed at 05/03/2022 0500 Gross per 24 hour  Intake 600 ml  Output 950 ml  Net -350 ml      05/03/2022    1:18 AM 05/02/2022    3:28 AM 05/01/2022    6:15 AM  Last 3 Weights  Weight (lbs) 269 lb 2.9 oz 266 lb 15.6 oz 280 lb 13.9 oz  Weight (kg) 122.1 kg 121.1 kg 127.4 kg      Telemetry    Atrial fibrillation rate controlled - Personally Reviewed    GEN: No acute distress.   Neck: No JVD Cardiac: irregular Respiratory:  Clear to auscultation bilaterally. GI: Soft, nontender, non-distended  MS: No edema Neuro:  Nonfocal  Psych: Normal affect   Labs    High Sensitivity Troponin:   Recent Labs  Lab 04/30/22 1311 04/30/22 1625  TROPONINIHS 17 16     Chemistry Recent Labs  Lab 04/30/22 1311 05/02/22 0702 05/03/22 0253  NA 136 136 137  K 4.1 3.2* 3.6  CL 100 99 107  CO2 24 23 23   GLUCOSE 137* 181* 175*  BUN 19 22 27*  CREATININE 0.81 0.74 0.78  CALCIUM 9.4 8.8* 8.8*  MG  --  1.7 2.0  PROT 7.5 6.5 6.4*  ALBUMIN 3.6 2.7* 2.7*  AST 54* 39 31  ALT 17 16 16   ALKPHOS 50 40 39  BILITOT 0.6 0.6 0.6  GFRNONAA >60 >60 >60  ANIONGAP 12 14 7      Hematology Recent Labs  Lab 04/30/22 1311 05/02/22 0702 05/03/22 0253  WBC 7.9 5.7 12.8*  RBC 4.73 4.34 4.32  HGB 13.1 12.2 12.1  HCT 42.5 37.2 37.6  MCV 89.9 85.7 87.0  MCH 27.7 28.1 28.0  MCHC 30.8 32.8  32.2  RDW 17.2* 16.7* 16.4*  PLT 119* 116* 142*   Thyroid  Recent Labs  Lab 05/01/22 0657  TSH 2.121    BNP Recent Labs  Lab 04/30/22 1311  BNP 150.1*    DDimer  Recent Labs  Lab 05/01/22 0657 05/02/22 0702 05/03/22 0253  DDIMER 1.05* 0.60* 0.63*     Radiology    ECHOCARDIOGRAM COMPLETE  Result Date: 05/01/2022    ECHOCARDIOGRAM REPORT   Patient Name:   Natasha Glass Date of Exam: 05/01/2022 Medical Rec #:  [id number]      Height:       63.0 in Accession #:    [id number]     Weight:       280.9 lb Date of Birth:  24-May-1941     BSA:          2.234 m Patient Age:    81 years       BP:           102/68 mmHg Patient Gender: F              HR:           80 bpm. Exam Location:  Inpatient Procedure: 2D Echo, Cardiac Doppler and Color Doppler Indications:    I48.91* Unspeicified atrial fibrillation  History:        Patient has prior history of Echocardiogram examinations, most                 recent 06/02/2017. Abnormal ECG, Arrythmias:Atrial Fibrillation,                 Signs/Symptoms:Chest Pain; Risk Factors:Sleep Apnea, Diabetes                  and Hypertension.  Sonographer:    Roseanna Rainbow RDCS Referring Phys: Sherryll Burger [facility]  Sonographer Comments: Patient is morbidly obese. Image acquisition challenging due to patient body habitus. IMPRESSIONS  1. Left ventricular ejection fraction, by estimation, is 70 to 75%. The left ventricle has hyperdynamic function. The left ventricle has no regional wall motion abnormalities. Left ventricular diastolic parameters are indeterminate.  2. Right ventricular systolic function is normal. The right ventricular size is normal. Tricuspid regurgitation signal is inadequate for assessing PA pressure.  3. Left atrial size was mildly dilated.  4. The mitral valve is abnormal. Trivial mitral valve regurgitation. No evidence of mitral stenosis. Severe mitral annular calcification.  5. The aortic valve is tricuspid. There is mild calcification of the aortic valve. There is mild thickening of the aortic valve. Aortic valve regurgitation is mild. Aortic valve sclerosis/calcification is present, without any evidence of aortic stenosis.  6. Aortic dilatation noted. There is moderate dilatation of the ascending aorta, measuring 46 mm.  7. The inferior vena cava is dilated in size with >50% respiratory variability, suggesting right atrial pressure of 8 mmHg. Comparison(s): No significant change from prior study. FINDINGS  Left Ventricle: Left ventricular ejection fraction, by estimation, is 70 to 75%. The left ventricle has hyperdynamic function. The left ventricle has no regional wall motion abnormalities. The left ventricular internal cavity size was normal in size. There is no left ventricular hypertrophy. Left ventricular diastolic parameters are indeterminate. Right Ventricle: The right ventricular size is normal. No increase in right ventricular wall thickness. Right ventricular systolic function is normal. Tricuspid regurgitation signal is inadequate for assessing PA pressure. Left Atrium: Left atrial size was  mildly dilated. Right Atrium: Right atrial size was normal in size. Pericardium: There  is no evidence of pericardial effusion. Mitral Valve: The mitral valve is abnormal. There is mild thickening of the mitral valve leaflet(s). There is mild calcification of the mitral valve leaflet(s). Severe mitral annular calcification. Trivial mitral valve regurgitation. No evidence of mitral valve stenosis. Tricuspid Valve: The tricuspid valve is normal in structure. Tricuspid valve regurgitation is trivial. Aortic Valve: The aortic valve is tricuspid. There is mild calcification of the aortic valve. There is mild thickening of the aortic valve. Aortic valve regurgitation is mild. Aortic valve sclerosis/calcification is present, without any evidence of aortic stenosis. Pulmonic Valve: The pulmonic valve was not well visualized. Pulmonic valve regurgitation is trivial. Aorta: Aortic dilatation noted. There is moderate dilatation of the ascending aorta, measuring 46 mm. Venous: The inferior vena cava is dilated in size with greater than 50% respiratory variability, suggesting right atrial pressure of 8 mmHg. IAS/Shunts: The atrial septum is grossly normal.  LEFT VENTRICLE PLAX 2D LVIDd:         4.20 cm LVIDs:         2.60 cm LV PW:         1.20 cm LV IVS:        0.87 cm LVOT diam:     2.20 cm LV SV:         76 LV SV Index:   34 LVOT Area:     3.80 cm  LV Volumes (MOD) LV vol d, MOD A2C: 54.2 ml LV vol d, MOD A4C: 54.4 ml LV vol s, MOD A2C: 15.8 ml LV vol s, MOD A4C: 12.3 ml LV SV MOD A2C:     38.4 ml LV SV MOD A4C:     54.4 ml LV SV MOD BP:      41.5 ml RIGHT VENTRICLE             IVC RV S prime:     14.30 cm/s  IVC diam: 2.60 cm TAPSE (M-mode): 1.7 cm LEFT ATRIUM             Index        RIGHT ATRIUM           Index LA diam:        3.30 cm 1.48 cm/m   RA Area:     13.00 cm LA Vol (A2C):   79.1 ml 35.41 ml/m  RA Volume:   26.20 ml  11.73 ml/m LA Vol (A4C):   65.0 ml 29.10 ml/m LA Biplane Vol: 74.3 ml 33.26 ml/m  AORTIC  VALVE             PULMONIC VALVE LVOT Vmax:   125.50 cm/s PR End Diast Vel: 1.30 msec LVOT Vmean:  77.350 cm/s LVOT VTI:    0.200 m  AORTA Ao Root diam: 3.20 cm Ao Asc diam:  4.55 cm MITRAL VALVE MV Area (PHT): 2.63 cm     SHUNTS MV Decel Time: 289 msec     Systemic VTI:  0.20 m MV E velocity: 106.00 cm/s  Systemic Diam: 2.20 cm Gwyndolyn Kaufman MD Electronically signed by Gwyndolyn Kaufman MD Signature Date/Time: 05/01/2022/1:55:13 PM    Final      Patient Profile     81 year old female with past medical history of paroxysmal atrial fibrillation, diabetes mellitus, hypertension, chronic diastolic congestive heart failure, prior pulmonary embolus, obstructive sleep apnea, ascending aortic aneurysm admitted with COVID for evaluation of atrial fibrillation with rapid ventricular response.  Echocardiogram this admission shows normal LV function, mild left atrial enlargement, mild aortic  insufficiency and moderately dilated ascending aorta at 46 mm.  Over the preceding 2 weeks prior to admission patient noticed generalized weakness and fatigue and fell.  Found to be COVID-positive.  Also noted to have atrial fibrillation with rapid ventricular response and cardiology asked to evaluate.  Note beta-blocker was decreased in January due to bradycardia.    Assessment & Plan    1 paroxysmal atrial fibrillation-patient remains in atrial fibrillation but appears to be doing well and heart rate is controlled.  This is likely precipitated by her COVID infection.  She had done well with Tikosyn previously.  We will plan to continue Tikosyn, metoprolol and Coumadin.  Plan cardioversion in 3 to 4 weeks when she has fully recovered from [place].  If her heart rate is difficult to control or she is symptomatic with her atrial fibrillation could proceed earlier but would need TEE as she was subtherapeutic at time of admission.  Note LV function is normal.    2 COVID infection-managed by primary service.   3 chronic  diastolic congestive heart failure-continue preadmission dose of Lasix.   4 thoracic aortic aneurysm-noted to be 4.4 cm on last CTA June 2022.  Will need repeat following discharge.  For questions or updates, please contact [place] Please consult [email or web address] for contact info under        Signed, Kirk Ruths, MD  05/03/2022, 8:14 AM

## 2022-05-03 NOTE — Progress Notes (Signed)
Physical Therapy Treatment Patient Details Name: Natasha Glass MRN: [id number] DOB: [birth year] Today's Date: 05/03/2022   History of Present Illness 81 y.o. female [facility] to [facility] hospital on 04/30/2022 with episodic weakness and lightheadedness. Pt experienced a fall on 04/29/2022 prior to admission. PMH inlcudes CHF, HTN, DMII, gout, DVT/PE, OSA, PAF.    PT Comments    Patient requires min A with bed mobility. Supervision for sit to stand from elevated commode and bed. Ambulated 25-30 feet in room with RW and min guard. Improved activity tolerance this session although continues to be limited by fatigue, sob. Patient is doing well moving short household distances and with a little help from family she should be able to return home at discharge. We will continue to work with her while she is here to improve strength, activity tolerance and safety.        Recommendations for follow up therapy are one component of a multi-disciplinary discharge planning process, led by the attending physician.  Recommendations may be updated based on patient status, additional functional criteria and insurance authorization.  Follow Up Recommendations  Home health PT Can patient physically be transported by private vehicle: No   Assistance Recommended at Discharge Intermittent Supervision/Assistance  Patient can return home with the following A little help with walking and/or transfers;A little help with bathing/dressing/bathroom;Help with stairs or ramp for entrance;Assist for transportation;Assistance with cooking/housework   Equipment Recommendations       Recommendations for Other Services       Precautions / Restrictions Precautions Precautions: Fall Precaution Comments: monitor HR and sats Restrictions Weight Bearing Restrictions: No     Mobility  Bed Mobility Overal bed mobility: Needs Assistance Bed Mobility: Supine to Sit, Sit to Supine     Supine to sit: Min assist, HOB elevated Sit  to supine: Mod assist   General bed mobility comments: use of rails, increased effort, assist for BLE with return to supine    Transfers Overall transfer level: Modified independent Equipment used: Rolling walker (2 wheels) Transfers: Sit to/from Stand Sit to Stand: From elevated surface, Min guard                Ambulation/Gait Ambulation/Gait assistance: Min guard Gait Distance (Feet): 25 Feet Assistive device: Rolling walker (2 wheels) Gait Pattern/deviations: Step-through pattern, Decreased step length - right, Decreased step length - left Gait velocity: reduced     General Gait Details: improved gait ability this date. Continues to fatigue with activity. HR up to 120s with minimal gait.   Stairs             Wheelchair Mobility    Modified Rankin (Stroke Patients Only)       Balance Overall balance assessment: Needs assistance Sitting-balance support: Feet supported Sitting balance-Leahy Scale: Good     Standing balance support: Bilateral upper extremity supported, During functional activity, Reliant on assistive device for balance   Standing balance comment: able to stand without or 1 UE support to clean [place]/wash hands                            Cognition Arousal/Alertness: Awake/alert Behavior During Therapy: WFL for tasks assessed/performed Overall Cognitive Status: Within Functional Limits for tasks assessed  Exercises      General Comments        Pertinent Vitals/Pain Pain Assessment Pain Assessment: No/denies pain    Home Living                          Prior Function            PT Goals (current goals can now be found in the care plan section) Acute Rehab PT Goals Patient Stated Goal: to return to independent mobility PT Goal Formulation: With patient Time For Goal Achievement: 05/15/22 Potential to Achieve Goals: Good Progress towards PT  goals: Progressing toward goals    Frequency    Min 3X/week      PT Plan Discharge plan needs to be updated    Co-evaluation              AM-PAC PT "6 Clicks" Mobility   Outcome Measure  Help needed turning from your back to your side while in a flat bed without using bedrails?: A Little Help needed moving from lying on your back to sitting on the side of a flat bed without using bedrails?: A Little Help needed moving to and from a bed to a chair (including a wheelchair)?: A Little Help needed standing up from a chair using your arms (e.g., wheelchair or bedside chair)?: A Lot Help needed to walk in hospital room?: A Little Help needed climbing 3-5 steps with a railing? : Total 6 Click Score: 15    End of Session Equipment Utilized During Treatment: Oxygen Activity Tolerance: Patient limited by fatigue Patient left: in bed;with call bell/phone within reach Nurse Communication: Mobility status PT Visit Diagnosis: Other abnormalities of gait and mobility (R26.89);Muscle weakness (generalized) (M62.81);History of falling (Z91.81)     Time: 2080-2233 PT Time Calculation (min) (ACUTE ONLY): 32 min  Charges:  $Gait Training: 8-22 mins $Therapeutic Activity: 8-22 mins                     Deshanta Lady, PT, GCS 05/03/22,3:15 PM

## 2022-05-03 NOTE — Progress Notes (Signed)
PROGRESS NOTE  Geral Tuch Ellenbecker WUJ:[id number] DOB: 19-Dec-1940 DOA: 04/30/2022 PCP: Josetta Huddle, MD  HPI/Recap of past 73 hours: 81 year old female with a past medical history of diastolic congestive heart failure, hypertension, non-insulin-dependent diabetes mellitus type 2, gout, prior history of DVT/PE, obstructive sleep apnea on CPAP and paroxysmal atrial fibrillation (On Warfarin/Tikosyn) and gastroesophageal reflux disease who presented to [facility] emergency department with complaints of episodic weakness and lightheadedness, because of patient's legs being so weak and unable to bear weight and falling to the floor.  Patient has also noted nonproductive cough.  In the ED found to be in A-fib with RVR as well as positive for COVID.  Cardiology consulted.    Today, patient denies any new complaints, reports some loose stool, denies any abdominal pain.     Assessment/Plan: Principal Problem:   Atrial fibrillation with rapid ventricular response (HCC) Active Problems:   COVID-19 virus infection   Fall at home, initial encounter   Chronic diastolic (congestive) heart failure (HCC)   Chest pain   Type 2 diabetes mellitus without complication, without long-term current use of insulin (HCC)   Essential hypertension   Mixed diabetic hyperlipidemia associated with type 2 diabetes mellitus (HCC)   OSA on CPAP   GERD without esophagitis   Gout   Atrial fibrillation with RVR (HCC)   Paroxysmal atrial fibrillation with rapid ventricular response (HCC) Likely in the setting of COVID infection Heart rate with better control Echo showed EF of 70 to 75%, no regional wall motion abnormalities, left ventricular diastolic parameters are indeterminate Cardiology on board, continue home dose of Tikosyn, increase metoprolol, weaned off Cardizem drip If HR becomes uncontrolled again, may need cardioversion after she recovers from [place] she will also need TEE prior to cardioversion D/C  heparin infusion, restart PTA warfarin (pharmacy to dose) Telemetry   COVID-19 virus infection Acute hypoxic respiratory failure Currently saturating around 96% on 2 L of oxygen, plan to wean off Chest x-ray unremarkable BC x2, urine coag negative staph in 1 bottle, and gram-positive rod in the other sets, both likely contaminants.  Repeat BC x2 pending CRP elevated, will trend Presence of hypoxia, initiated Solu-Medrol, followed by prednisone Continue 5-day course of Paxlovid Continue bronchodilators, cough suppressants Continue to trend inflammatory markers Monitor closely  Hypokalemia Replace as needed  Chest pain Episodes of atypical chest discomfort likely related to rapid atrial fibrillation Serial cardiac enzymes here are unremarkable No evidence of dynamic ST segment change on EKG Telemetry  Fall at home, initial encounter Weakness likely secondary to COVID-19 infection with bouts of rapid atrial fibrillation Complains of right knee pain have been evaluated with both x-ray and CT imaging of the right knee which revealed no evidence of damage to the hardware or bony injury PT/OT-recommend SNF Fall precautions  Chronic diastolic (congestive) heart failure (HCC) No clinical evidence of cardiogenic volume overload Continue home Lasix Strict I's and O's, daily weights   Type 2 diabetes mellitus without complication, without long-term current use of insulin (HCC) A1c 6.4 SSI, Accu-Cheks, hypoglycemic protocol  Essential hypertension BP stable Monitor BP closely   Mixed diabetic hyperlipidemia associated with type 2 diabetes mellitus ([place]) Continuing home regimen of lipid lowering therapy.   OSA on CPAP Continuing home regimen of CPAP nightly   GERD without esophagitis Protonix 40 mg daily.   Gout Continuing home regimen of allopurinol   Morbid obesity Lifestyle modification advised   Estimated body mass index is 47.68 kg/m as calculated from the  following:  Height as of this encounter: 5' 3"  (1.6 m).   Weight as of this encounter: 122.1 kg.     Code Status: Full  Family Communication: None at bedside  Disposition Plan: Status is: Inpatient The patient will require care spanning > 2 midnights and should be moved to inpatient because: Level of care    Consultants: Cardiology  Procedures: None  Antimicrobials: None  DVT prophylaxis: Coumadin   Objective: Vitals:   05/03/22 0833 05/03/22 1205 05/03/22 1215 05/03/22 1645  BP: 133/90 99/72 99/72  115/73  Pulse:   86   Resp: 20 (!) 21 20 (!) 22  Temp: 97.8 F (36.6 C) 97.8 F (36.6 C) 97.8 F (36.6 C) 98.2 F (36.8 C)  TempSrc: Oral Oral Oral Oral  SpO2: 96% 96% 95% 93%  Weight:      Height:        Intake/Output Summary (Last 24 hours) at 05/03/2022 1909 Last data filed at 05/03/2022 1700 Gross per 24 hour  Intake 720 ml  Output 1100 ml  Net -380 ml   Filed Weights   05/01/22 0615 05/02/22 0328 05/03/22 0118  Weight: 127.4 kg 121.1 kg 122.1 kg    Exam: General: NAD  Cardiovascular: S1, S2 present Respiratory: CTAB Abdomen: Soft, nontender, nondistended, bowel sounds present Musculoskeletal: Trace bilateral pedal edema noted, chronic venous stasis changes noted bilaterally Skin: As noted above Psychiatry: Normal mood      Data Reviewed: CBC: Recent Labs  Lab 04/30/22 1311 05/02/22 0702 05/03/22 0253  WBC 7.9 5.7 12.8*  NEUTROABS  --  4.8 11.5*  HGB 13.1 12.2 12.1  HCT 42.5 37.2 37.6  MCV 89.9 85.7 87.0  PLT 119* 116* 096*   Basic Metabolic Panel: Recent Labs  Lab 04/30/22 1311 05/02/22 0702 05/03/22 0253  NA 136 136 137  K 4.1 3.2* 3.6  CL 100 99 107  CO2 24 23 23   GLUCOSE 137* 181* 175*  BUN 19 22 27*  CREATININE 0.81 0.74 0.78  CALCIUM 9.4 8.8* 8.8*  MG  --  1.7 2.0   GFR: Estimated Creatinine Clearance: 71.1 mL/min (by C-G formula based on SCr of 0.78 mg/dL). Liver Function Tests: Recent Labs  Lab 04/30/22 1311  05/02/22 0702 05/03/22 0253  AST 54* 39 31  ALT 17 16 16   ALKPHOS 50 40 39  BILITOT 0.6 0.6 0.6  PROT 7.5 6.5 6.4*  ALBUMIN 3.6 2.7* 2.7*   No results for input(s): "LIPASE", "AMYLASE" in the last 168 hours. No results for input(s): "AMMONIA" in the last 168 hours. Coagulation Profile: Recent Labs  Lab 04/30/22 1421 05/01/22 2115 05/02/22 0702 05/03/22 0253  INR 1.8* 2.0* 2.0* 3.0*   Cardiac Enzymes: No results for input(s): "CKTOTAL", "CKMB", "CKMBINDEX", "TROPONINI" in the last 168 hours. BNP (last 3 results) No results for input(s): "PROBNP" in the last 8760 hours. HbA1C: Recent Labs    05/01/22 0657  HGBA1C 6.4*   CBG: Recent Labs  Lab 05/02/22 1821 05/02/22 2222 05/03/22 0832 05/03/22 1203 05/03/22 1644  GLUCAP 238* 262* 196* 231* 245*   Lipid Profile: No results for input(s): "CHOL", "HDL", "LDLCALC", "TRIG", "CHOLHDL", "LDLDIRECT" in the last 72 hours. Thyroid Function Tests: Recent Labs    05/01/22 0657  TSH 2.121   Anemia Panel: No results for input(s): "VITAMINB12", "FOLATE", "FERRITIN", "TIBC", "IRON", "RETICCTPCT" in the last 72 hours. Urine analysis:    Component Value Date/Time   COLORURINE STRAW (A) 04/30/2022 1207   APPEARANCEUR CLEAR 04/30/2022 1207   LABSPEC 1.005 04/30/2022 1207  PHURINE 6.0 04/30/2022 1207   GLUCOSEU NEGATIVE 04/30/2022 1207   HGBUR SMALL (A) 04/30/2022 [place] 04/30/2022 1207   KETONESUR NEGATIVE 04/30/2022 1207   PROTEINUR NEGATIVE 04/30/2022 1207   UROBILINOGEN 0.2 01/04/2011 0915   NITRITE NEGATIVE 04/30/2022 1207   LEUKOCYTESUR NEGATIVE 04/30/2022 1207   Sepsis Labs: @LABRCNTIP (procalcitonin:4,lacticidven:4)  ) Recent Results (from the past 240 hour(s))  Resp Panel by RT-PCR (Flu A&B, Covid) Anterior Nasal Swab     Status: Abnormal   Collection Time: 04/30/22  2:51 PM   Specimen: Anterior Nasal Swab  Result Value Ref Range Status   SARS Coronavirus 2 by RT PCR POSITIVE (A)  NEGATIVE Final    Comment: (NOTE) SARS-CoV-2 target nucleic acids are DETECTED.  The SARS-CoV-2 RNA is generally detectable in upper respiratory specimens during the acute phase of infection. Positive results are indicative of the presence of the identified virus, but do not rule out bacterial infection or co-infection with other pathogens not detected by the test. Clinical correlation with patient history and other diagnostic information is necessary to determine patient infection status. The expected result is Negative.  Fact Sheet for Patients: EntrepreneurPulse.com.au  Fact Sheet for Healthcare Providers: IncredibleEmployment.be  This test is not yet approved or cleared by the [place] FDA and  has been authorized for detection and/or diagnosis of SARS-CoV-2 by FDA under an Emergency Use Authorization (EUA).  This EUA will remain in effect (meaning this test can be used) for the duration of  the COVID-19 declaration under Section 564(b)(1) of the A ct, 21 U.S.C. section 360bbb-3(b)(1), unless the authorization is terminated or revoked sooner.     Influenza A by PCR NEGATIVE NEGATIVE Final   Influenza B by PCR NEGATIVE NEGATIVE Final    Comment: (NOTE) The Xpert Xpress SARS-CoV-2/FLU/RSV plus assay is intended as an aid in the diagnosis of influenza from Nasopharyngeal swab specimens and should not be used as a sole basis for treatment. Nasal washings and aspirates are unacceptable for Xpert Xpress SARS-CoV-2/FLU/RSV testing.  Fact Sheet for Patients: EntrepreneurPulse.com.au  Fact Sheet for Healthcare Providers: IncredibleEmployment.be  This test is not yet approved or cleared by the [place] FDA and has been authorized for detection and/or diagnosis of SARS-CoV-2 by FDA under an Emergency Use Authorization (EUA). This EUA will remain in effect (meaning this test can be used) for the  duration of the COVID-19 declaration under Section 564(b)(1) of the Act, 21 U.S.C. section 360bbb-3(b)(1), unless the authorization is terminated or revoked.  Performed at [facility], [place] [address], [place], [place] [address]   Culture, blood (Routine X 2) w Reflex to ID Panel     Status: Abnormal   Collection Time: 05/01/22  6:57 AM   Specimen: BLOOD LEFT HAND  Result Value Ref Range Status   Specimen Description BLOOD LEFT HAND  Final   Special Requests   Final    BOTTLES DRAWN AEROBIC ONLY Blood Culture adequate volume   Culture  Setup Time   Final    GRAM POSITIVE COCCI AEROBIC BOTTLE ONLY Organism ID to follow CRITICAL RESULT CALLED TO, READ BACK BY AND VERIFIED WITH: V BRYK,[id number]  05/02/22 [place]    Culture (A)  Final    STAPHYLOCOCCUS SIMULANS THE SIGNIFICANCE OF ISOLATING THIS ORGANISM FROM A SINGLE SET OF BLOOD CULTURES WHEN MULTIPLE SETS ARE DRAWN IS UNCERTAIN. PLEASE NOTIFY THE MICROBIOLOGY DEPARTMENT WITHIN ONE WEEK IF SPECIATION AND SENSITIVITIES ARE REQUIRED. Performed at [facility], [place] [address]  [place], [place] [address]    Report Status 05/03/2022 FINAL  Final  Culture, blood (Routine X 2) w Reflex to ID Panel     Status: None (Preliminary result)   Collection Time: 05/01/22  6:57 AM   Specimen: BLOOD RIGHT HAND  Result Value Ref Range Status   Specimen Description BLOOD RIGHT HAND  Final   Special Requests   Final    BOTTLES DRAWN AEROBIC ONLY Blood Culture adequate volume   Culture  Setup Time   Final    GRAM POSITIVE RODS AEROBIC BOTTLE ONLY CRITICAL RESULT CALLED TO, READ BACK BY AND VERIFIED WITH: G. Aris Lot PHARMD, AT [id number] 05/02/22 [place]. [place] Performed at [facility], [place] [address], [place], [place] [address]    Culture GRAM POSITIVE RODS  Final   Report Status PENDING  Incomplete  Blood Culture ID Panel (Reflexed)     Status: Abnormal   Collection Time: 05/01/22  6:57 AM  Result Value Ref Range Status   Enterococcus  faecalis NOT DETECTED NOT DETECTED Final   Enterococcus Faecium NOT DETECTED NOT DETECTED Final   Listeria monocytogenes NOT DETECTED NOT DETECTED Final   Staphylococcus species DETECTED (A) NOT DETECTED Final    Comment: CRITICAL RESULT CALLED TO, READ BACK BY AND VERIFIED WITH: V BRYK,[id number]  05/02/22 [place]    Staphylococcus aureus (BCID) NOT DETECTED NOT DETECTED Final   Staphylococcus epidermidis NOT DETECTED NOT DETECTED Final   Staphylococcus lugdunensis NOT DETECTED NOT DETECTED Final   Streptococcus species NOT DETECTED NOT DETECTED Final   Streptococcus agalactiae NOT DETECTED NOT DETECTED Final   Streptococcus pneumoniae NOT DETECTED NOT DETECTED Final   Streptococcus pyogenes NOT DETECTED NOT DETECTED Final   A.calcoaceticus-baumannii NOT DETECTED NOT DETECTED Final   Bacteroides fragilis NOT DETECTED NOT DETECTED Final   Enterobacterales NOT DETECTED NOT DETECTED Final   Enterobacter cloacae complex NOT DETECTED NOT DETECTED Final   Escherichia coli NOT DETECTED NOT DETECTED Final   Klebsiella aerogenes NOT DETECTED NOT DETECTED Final   Klebsiella oxytoca NOT DETECTED NOT DETECTED Final   Klebsiella pneumoniae NOT DETECTED NOT DETECTED Final   Proteus species NOT DETECTED NOT DETECTED Final   Salmonella species NOT DETECTED NOT DETECTED Final   Serratia marcescens NOT DETECTED NOT DETECTED Final   Haemophilus influenzae NOT DETECTED NOT DETECTED Final   Neisseria meningitidis NOT DETECTED NOT DETECTED Final   Pseudomonas aeruginosa NOT DETECTED NOT DETECTED Final   Stenotrophomonas maltophilia NOT DETECTED NOT DETECTED Final   Candida albicans NOT DETECTED NOT DETECTED Final   Candida auris NOT DETECTED NOT DETECTED Final   Candida glabrata NOT DETECTED NOT DETECTED Final   Candida krusei NOT DETECTED NOT DETECTED Final   Candida parapsilosis NOT DETECTED NOT DETECTED Final   Candida tropicalis NOT DETECTED NOT DETECTED Final   Cryptococcus neoformans/gattii NOT  DETECTED NOT DETECTED Final    Comment: Performed at [facility] [place] [facility], [address] [place], [place] [address]  Culture, blood (Routine X 2) w Reflex to ID Panel     Status: None (Preliminary result)   Collection Time: 05/02/22  8:52 AM   Specimen: BLOOD RIGHT HAND  Result Value Ref Range Status   Specimen Description BLOOD RIGHT HAND  Final   Special Requests   Final    BOTTLES DRAWN AEROBIC AND ANAEROBIC Blood Culture adequate volume   Culture   Final    NO GROWTH < 24 HOURS Performed at [facility], [place] [address], [place], [place] [address]  Report Status PENDING  Incomplete  Culture, blood (Routine X 2) w Reflex to ID Panel     Status: None (Preliminary result)   Collection Time: 05/02/22  9:54 AM   Specimen: BLOOD  Result Value Ref Range Status   Specimen Description BLOOD LEFT ANTECUBITAL  Final   Special Requests   Final    BOTTLES DRAWN AEROBIC AND ANAEROBIC Blood Culture adequate volume   Culture   Final    NO GROWTH < 24 HOURS Performed at [facility], [address] [place], [place] [address]    Report Status PENDING  Incomplete      Studies: No results found.  Scheduled Meds:  allopurinol  150 mg Oral QPM   vitamin C  500 mg Oral Daily   dofetilide  250 mcg Oral BID   furosemide  40 mg Oral Daily   insulin aspart  0-15 Units Subcutaneous TID AC & HS   insulin aspart  3 Units Subcutaneous TID WC   ipratropium-albuterol  3 mL Nebulization Once   methylPREDNISolone (SOLU-MEDROL) injection  125 mg Intravenous Q12H   metoprolol tartrate  50 mg Oral BID   molnupiravir EUA  4 capsule Oral BID   pantoprazole  40 mg Oral Daily   [START ON 05/05/2022] predniSONE  50 mg Oral Q breakfast   rosuvastatin  5 mg Oral Daily   sodium chloride flush  3 mL Intravenous Q12H   Warfarin - Pharmacist Dosing Inpatient   Does not apply q1600   zinc sulfate  220 mg Oral Daily    Continuous Infusions:     LOS: 2 days     Alma Friendly, MD [facility]  If 7PM-7AM, please contact night-coverage [email or web address] 05/03/2022, 7:09 PM

## 2022-05-03 NOTE — TOC Progression Note (Addendum)
Transition of Care [facility]) - Progression Note    Patient Details  Name: Natasha Glass MRN: [id number] Date of Birth: 12/13/1940  [facility] CM/SW Contact  Graves-Bigelow, Ocie Cornfield, RN Phone Number: 05/03/2022, 12:31 PM  Clinical Narrative:  Case Manager spoke with the patient regarding home health services. Patient has used [facility] in the past and this is her first choice. Second/third choices are [facility] and [facility]. PT/OT continues to work with the patient to see if she can start moving better/less deconditioned to get home. Patient wants to go home as soon as she can. PTA patient was from home alone. Patient states she has support of her daughter and neighbors. Patient has durable medical equipment cane, rollator, hospital bed, rolling walker and bedside commode. Recommendations continue to be for SNF- No Home Health has been arranged at this time due to recommendations. MD is aware that PT/OT continues to work with the patient until she gets stronger to transition home. Case Manager will continue to follow for additional transition of care needs as the patient progresses.    1413 05-03-22 Corene Cornea with [facility] is following the patient for [facility] PT/OT services once she is stable to transition home. Please follow for orders.   Expected Discharge Plan: [place] Barriers to Discharge: Continued Medical Work up  Expected Discharge Plan and Services Expected Discharge Plan: [place] In-house Referral: Clinical Social Work Discharge Planning Services: CM Consult Post Acute Care Choice: [place] arrangements for the past 2 months: Single Family Home                 DME Arranged: N/A DME Agency: NA       HH Arranged: PT, OT   Readmission Risk Interventions    08/30/2021   12:37 PM  Readmission Risk Prevention Plan  Medication Screening Complete  Transportation Screening Complete

## 2022-05-03 NOTE — Care Management Important Message (Signed)
Important Message  Patient Details  Name: ISRAELLA HUBERT MRN: [id number] Date of Birth: 08-Apr-1941   Medicare Important Message Given:  Yes     Shelda Altes 05/03/2022, 9:58 AM

## 2022-05-04 DIAGNOSIS — I10 Essential (primary) hypertension: Secondary | ICD-10-CM | POA: Diagnosis not present

## 2022-05-04 DIAGNOSIS — I5032 Chronic diastolic (congestive) heart failure: Secondary | ICD-10-CM | POA: Diagnosis not present

## 2022-05-04 DIAGNOSIS — U071 COVID-19: Secondary | ICD-10-CM | POA: Diagnosis not present

## 2022-05-04 DIAGNOSIS — I4891 Unspecified atrial fibrillation: Secondary | ICD-10-CM | POA: Diagnosis not present

## 2022-05-04 LAB — COMPREHENSIVE METABOLIC PANEL
ALT: 17 U/L (ref 0–44)
AST: 23 U/L (ref 15–41)
Albumin: 2.9 g/dL — ABNORMAL LOW (ref 3.5–5.0)
Alkaline Phosphatase: 38 U/L (ref 38–126)
Anion gap: 9 (ref 5–15)
BUN: 29 mg/dL — ABNORMAL HIGH (ref 8–23)
CO2: 26 mmol/L (ref 22–32)
Calcium: 8.6 mg/dL — ABNORMAL LOW (ref 8.9–10.3)
Chloride: 104 mmol/L (ref 98–111)
Creatinine, Ser: 1.01 mg/dL — ABNORMAL HIGH (ref 0.44–1.00)
GFR, Estimated: 56 mL/min — ABNORMAL LOW (ref >60)
Glucose, Bld: 210 mg/dL — ABNORMAL HIGH (ref 70–99)
Potassium: 3.5 mmol/L (ref 3.5–5.1)
Sodium: 139 mmol/L (ref 135–145)
Total Bilirubin: 0.5 mg/dL (ref 0.3–1.2)
Total Protein: 6.2 g/dL — ABNORMAL LOW (ref 6.5–8.1)

## 2022-05-04 LAB — CULTURE, BLOOD (ROUTINE X 2): Special Requests: ADEQUATE

## 2022-05-04 LAB — MAGNESIUM: Magnesium: 2 mg/dL (ref 1.7–2.4)

## 2022-05-04 LAB — CBC WITH DIFFERENTIAL/PLATELET
Abs Immature Granulocytes: 0.16 10*3/uL — ABNORMAL HIGH (ref 0.00–0.07)
Basophils Absolute: 0 10*3/uL (ref 0.0–0.1)
Basophils Relative: 0 %
Eosinophils Absolute: 0 10*3/uL (ref 0.0–0.5)
Eosinophils Relative: 0 %
HCT: 37.5 % (ref 36.0–46.0)
Hemoglobin: 11.8 g/dL — ABNORMAL LOW (ref 12.0–15.0)
Immature Granulocytes: 1 %
Lymphocytes Relative: 5 %
Lymphs Abs: 0.7 10*3/uL (ref 0.7–4.0)
MCH: 27.9 pg (ref 26.0–34.0)
MCHC: 31.5 g/dL (ref 30.0–36.0)
MCV: 88.7 fL (ref 80.0–100.0)
Monocytes Absolute: 0.3 10*3/uL (ref 0.1–1.0)
Monocytes Relative: 2 %
Neutro Abs: 12.2 10*3/uL — ABNORMAL HIGH (ref 1.7–7.7)
Neutrophils Relative %: 92 %
Platelets: 179 10*3/uL (ref 150–400)
RBC: 4.23 MIL/uL (ref 3.87–5.11)
RDW: 16.6 % — ABNORMAL HIGH (ref 11.5–15.5)
WBC: 13.3 10*3/uL — ABNORMAL HIGH (ref 4.0–10.5)
nRBC: 0 % (ref 0.0–0.2)

## 2022-05-04 LAB — D-DIMER, QUANTITATIVE: D-Dimer, Quant: 0.45 ug/mL-FEU (ref 0.00–0.50)

## 2022-05-04 LAB — GLUCOSE, CAPILLARY
Glucose-Capillary: 197 mg/dL — ABNORMAL HIGH (ref 70–99)
Glucose-Capillary: 215 mg/dL — ABNORMAL HIGH (ref 70–99)
Glucose-Capillary: 181 mg/dL — ABNORMAL HIGH (ref 70–99)
Glucose-Capillary: 256 mg/dL — ABNORMAL HIGH (ref 70–99)
Glucose-Capillary: 226 mg/dL — ABNORMAL HIGH (ref 70–99)

## 2022-05-04 LAB — PROTIME-INR
INR: 4 — ABNORMAL HIGH (ref 0.8–1.2)
Prothrombin Time: 39 seconds — ABNORMAL HIGH (ref 11.4–15.2)

## 2022-05-04 LAB — C-REACTIVE PROTEIN: CRP: 3.8 mg/dL — ABNORMAL HIGH (ref <1.0)

## 2022-05-04 MED ORDER — DM-GUAIFENESIN ER 30-600 MG PO TB12
1.0000 | ORAL_TABLET | Freq: Two times a day (BID) | ORAL | Status: DC
  Administered 2022-05-04 – 2022-05-05 (×3): 1 via ORAL
  Filled 2022-05-04 (×4): qty 1

## 2022-05-04 MED ORDER — FUROSEMIDE 40 MG PO TABS
40.0000 mg | ORAL_TABLET | Freq: Two times a day (BID) | ORAL | Status: DC
  Administered 2022-05-04 – 2022-05-05 (×2): 40 mg via ORAL
  Filled 2022-05-04 (×2): qty 1

## 2022-05-04 NOTE — Progress Notes (Signed)
PROGRESS NOTE  Natasha Glass LKG:[id number] DOB: [birth year] DOA: 04/30/2022 PCP: Josetta Huddle, MD  HPI/Recap of past 55 hours: 81 year old female with a past medical history of diastolic congestive heart failure, hypertension, non-insulin-dependent diabetes mellitus type 2, gout, prior history of DVT/PE, obstructive sleep apnea on CPAP and paroxysmal atrial fibrillation (On Warfarin/Tikosyn) and gastroesophageal reflux disease who presented to [facility] emergency department with complaints of episodic weakness and lightheadedness, because of patient's legs being so weak and unable to bear weight and falling to the floor.  Patient has also noted nonproductive cough.  In the ED found to be in A-fib with RVR as well as positive for COVID.  Cardiology consulted.    Today, patient denies any new complaints, noted to be coughing more.    Assessment/Plan: Principal Problem:   Atrial fibrillation with rapid ventricular response (HCC) Active Problems:   COVID-19 virus infection   Fall at home, initial encounter   Chronic diastolic (congestive) heart failure (HCC)   Chest pain   Type 2 diabetes mellitus without complication, without long-term current use of insulin (HCC)   Essential hypertension   Mixed diabetic hyperlipidemia associated with type 2 diabetes mellitus (HCC)   OSA on CPAP   GERD without esophagitis   Gout   Atrial fibrillation with RVR (HCC)   Paroxysmal atrial fibrillation with rapid ventricular response (HCC) Likely in the setting of COVID infection Heart rate with better control Echo showed EF of 70 to 75%, no regional wall motion abnormalities, left ventricular diastolic parameters are indeterminate Cardiology on board, continue home dose of Tikosyn, increase metoprolol, weaned off Cardizem drip If HR becomes uncontrolled again, may need cardioversion after she recovers from [place] she will also need TEE prior to cardioversion D/C heparin infusion, restart  PTA warfarin (pharmacy to dose) Telemetry   COVID-19 virus infection Acute hypoxic respiratory failure Currently saturating around 96% on RA, weaned off O2 Chest x-ray unremarkable BC x2, urine coag negative staph in 1 bottle, and gram-positive rod in the other sets, both likely contaminants.  Repeat BC x2 NGTD CRP elevated, will trend Presence of hypoxia, s/p Solu-Medrol, on prednisone Continue 5-day course of Paxlovid Continue bronchodilators, cough suppressants Continue to trend inflammatory markers Monitor closely  Hypokalemia Replace as needed  Chest pain Episodes of atypical chest discomfort likely related to rapid atrial fibrillation Serial cardiac enzymes here are unremarkable No evidence of dynamic ST segment change on EKG Telemetry  Fall at home, initial encounter Weakness likely secondary to COVID-19 infection with bouts of rapid atrial fibrillation Complains of right knee pain have been evaluated with both x-ray and CT imaging of the right knee which revealed no evidence of damage to the hardware or bony injury PT/OT-recommend SNF Fall precautions  Chronic diastolic (congestive) heart failure (HCC) No clinical evidence of cardiogenic volume overload Continue Lasix Strict I's and O's, daily weights   Type 2 diabetes mellitus without complication, without long-term current use of insulin (HCC) A1c 6.4 SSI, Accu-Cheks, hypoglycemic protocol  Essential hypertension BP stable Monitor BP closely   Mixed diabetic hyperlipidemia associated with type 2 diabetes mellitus ([place]) Continuing home regimen of lipid lowering therapy.   OSA on CPAP Continuing home regimen of CPAP nightly   GERD without esophagitis Protonix 40 mg daily.   Gout Continuing home regimen of allopurinol   Morbid obesity Lifestyle modification advised   Estimated body mass index is 47.68 kg/m as calculated from the following:   Height as of this encounter: 5' 3"  (1.6 m).  Weight as  of this encounter: 122.1 kg.     Code Status: Full  Family Communication: None at bedside  Disposition Plan: Status is: Inpatient The patient will require care spanning > 2 midnights and should be moved to inpatient because: Level of care    Consultants: Cardiology  Procedures: None  Antimicrobials: None  DVT prophylaxis: Coumadin   Objective: Vitals:   05/03/22 2315 05/04/22 0610 05/04/22 0816 05/04/22 1243  BP: (!) 158/70 (!) 142/73 (!) 133/95 122/82  Pulse: 93 90  95  Resp: 16 17 17 19   Temp: (!) 97.3 F (36.3 C) 97.8 F (36.6 C) 97.9 F (36.6 C) 97.8 F (36.6 C)  TempSrc: Oral Oral Oral Oral  SpO2:  96% 94% 93%  Weight:      Height:        Intake/Output Summary (Last 24 hours) at 05/04/2022 1815 Last data filed at 05/04/2022 1400 Gross per 24 hour  Intake 480 ml  Output 1100 ml  Net -620 ml   Filed Weights   05/01/22 0615 05/02/22 0328 05/03/22 0118  Weight: 127.4 kg 121.1 kg 122.1 kg    Exam: General: NAD  Cardiovascular: S1, S2 present Respiratory: CTAB Abdomen: Soft, nontender, nondistended, bowel sounds present Musculoskeletal: Trace bilateral pedal edema noted, chronic venous stasis changes noted bilaterally Skin: As noted above Psychiatry: Normal mood      Data Reviewed: CBC: Recent Labs  Lab 04/30/22 1311 05/02/22 0702 05/03/22 0253 05/04/22 0222  WBC 7.9 5.7 12.8* 13.3*  NEUTROABS  --  4.8 11.5* 12.2*  HGB 13.1 12.2 12.1 11.8*  HCT 42.5 37.2 37.6 37.5  MCV 89.9 85.7 87.0 88.7  PLT 119* 116* 142* 937   Basic Metabolic Panel: Recent Labs  Lab 04/30/22 1311 05/02/22 0702 05/03/22 0253 05/04/22 0222  NA 136 136 137 139  K 4.1 3.2* 3.6 3.5  CL 100 99 107 104  CO2 24 23 23 26   GLUCOSE 137* 181* 175* 210*  BUN 19 22 27* 29*  CREATININE 0.81 0.74 0.78 1.01*  CALCIUM 9.4 8.8* 8.8* 8.6*  MG  --  1.7 2.0 2.0   GFR: Estimated Creatinine Clearance: 56.3 mL/min (A) (by C-G formula based on SCr of 1.01 mg/dL (H)). Liver  Function Tests: Recent Labs  Lab 04/30/22 1311 05/02/22 0702 05/03/22 0253 05/04/22 0222  AST 54* 39 31 23  ALT 17 16 16 17   ALKPHOS 50 40 39 38  BILITOT 0.6 0.6 0.6 0.5  PROT 7.5 6.5 6.4* 6.2*  ALBUMIN 3.6 2.7* 2.7* 2.9*   No results for input(s): "LIPASE", "AMYLASE" in the last 168 hours. No results for input(s): "AMMONIA" in the last 168 hours. Coagulation Profile: Recent Labs  Lab 04/30/22 1421 05/01/22 2115 05/02/22 0702 05/03/22 0253 05/04/22 0222  INR 1.8* 2.0* 2.0* 3.0* 4.0*   Cardiac Enzymes: No results for input(s): "CKTOTAL", "CKMB", "CKMBINDEX", "TROPONINI" in the last 168 hours. BNP (last 3 results) No results for input(s): "PROBNP" in the last 8760 hours. HbA1C: No results for input(s): "HGBA1C" in the last 72 hours.  CBG: Recent Labs  Lab 05/03/22 1203 05/03/22 1644 05/03/22 2236 05/04/22 0815 05/04/22 1248  GLUCAP 231* 245* 266* 197* 215*   Lipid Profile: No results for input(s): "CHOL", "HDL", "LDLCALC", "TRIG", "CHOLHDL", "LDLDIRECT" in the last 72 hours. Thyroid Function Tests: No results for input(s): "TSH", "T4TOTAL", "FREET4", "T3FREE", "THYROIDAB" in the last 72 hours.  Anemia Panel: No results for input(s): "VITAMINB12", "FOLATE", "FERRITIN", "TIBC", "IRON", "RETICCTPCT" in the last 72 hours. Urine analysis:  Component Value Date/Time   COLORURINE STRAW (A) 04/30/2022 1207   APPEARANCEUR CLEAR 04/30/2022 1207   LABSPEC 1.005 04/30/2022 1207   PHURINE 6.0 04/30/2022 1207   GLUCOSEU NEGATIVE 04/30/2022 1207   HGBUR SMALL (A) 04/30/2022 1207   BILIRUBINUR NEGATIVE 04/30/2022 1207   KETONESUR NEGATIVE 04/30/2022 1207   PROTEINUR NEGATIVE 04/30/2022 1207   UROBILINOGEN 0.2 01/04/2011 0915   NITRITE NEGATIVE 04/30/2022 1207   LEUKOCYTESUR NEGATIVE 04/30/2022 1207   Sepsis Labs: @LABRCNTIP (procalcitonin:4,lacticidven:4)  ) Recent Results (from the past 240 hour(s))  Resp Panel by RT-PCR (Flu A&B, Covid) Anterior Nasal Swab      Status: Abnormal   Collection Time: 04/30/22  2:51 PM   Specimen: Anterior Nasal Swab  Result Value Ref Range Status   SARS Coronavirus 2 by RT PCR POSITIVE (A) NEGATIVE Final    Comment: (NOTE) SARS-CoV-2 target nucleic acids are DETECTED.  The SARS-CoV-2 RNA is generally detectable in upper respiratory specimens during the acute phase of infection. Positive results are indicative of the presence of the identified virus, but do not rule out bacterial infection or co-infection with other pathogens not detected by the test. Clinical correlation with patient history and other diagnostic information is necessary to determine patient infection status. The expected result is Negative.  Fact Sheet for Patients: EntrepreneurPulse.com.au  Fact Sheet for Healthcare Providers: IncredibleEmployment.be  This test is not yet approved or cleared by the [place] FDA and  has been authorized for detection and/or diagnosis of SARS-CoV-2 by FDA under an Emergency Use Authorization (EUA).  This EUA will remain in effect (meaning this test can be used) for the duration of  the COVID-19 declaration under Section 564(b)(1) of the A ct, 21 U.S.C. section 360bbb-3(b)(1), unless the authorization is terminated or revoked sooner.     Influenza A by PCR NEGATIVE NEGATIVE Final   Influenza B by PCR NEGATIVE NEGATIVE Final    Comment: (NOTE) The Xpert Xpress SARS-CoV-2/FLU/RSV plus assay is intended as an aid in the diagnosis of influenza from Nasopharyngeal swab specimens and should not be used as a sole basis for treatment. Nasal washings and aspirates are unacceptable for Xpert Xpress SARS-CoV-2/FLU/RSV testing.  Fact Sheet for Patients: EntrepreneurPulse.com.au  Fact Sheet for Healthcare Providers: IncredibleEmployment.be  This test is not yet approved or cleared by the [place] FDA and has been authorized for  detection and/or diagnosis of SARS-CoV-2 by FDA under an Emergency Use Authorization (EUA). This EUA will remain in effect (meaning this test can be used) for the duration of the COVID-19 declaration under Section 564(b)(1) of the Act, 21 U.S.C. section 360bbb-3(b)(1), unless the authorization is terminated or revoked.  Performed at [facility], [place] [address], [place], [place] [address]   Culture, blood (Routine X 2) w Reflex to ID Panel     Status: Abnormal   Collection Time: 05/01/22  6:57 AM   Specimen: BLOOD LEFT HAND  Result Value Ref Range Status   Specimen Description BLOOD LEFT HAND  Final   Special Requests   Final    BOTTLES DRAWN AEROBIC ONLY Blood Culture adequate volume   Culture  Setup Time   Final    GRAM POSITIVE COCCI AEROBIC BOTTLE ONLY Organism ID to follow CRITICAL RESULT CALLED TO, READ BACK BY AND VERIFIED WITH: V BRYK,[email or web address]  05/02/22 [place]    Culture (A)  Final    STAPHYLOCOCCUS SIMULANS THE SIGNIFICANCE OF ISOLATING THIS ORGANISM FROM A SINGLE SET OF BLOOD CULTURES WHEN MULTIPLE SETS ARE DRAWN IS UNCERTAIN.  PLEASE NOTIFY THE MICROBIOLOGY DEPARTMENT WITHIN ONE WEEK IF SPECIATION AND SENSITIVITIES ARE REQUIRED. Performed at [facility], [place] [address] [place], [place] [address]    Report Status 05/03/2022 FINAL  Final  Culture, blood (Routine X 2) w Reflex to ID Panel     Status: Abnormal   Collection Time: 05/01/22  6:57 AM   Specimen: BLOOD RIGHT HAND  Result Value Ref Range Status   Specimen Description BLOOD RIGHT HAND  Final   Special Requests   Final    BOTTLES DRAWN AEROBIC ONLY Blood Culture adequate volume   Culture  Setup Time   Final    GRAM POSITIVE RODS AEROBIC BOTTLE ONLY CRITICAL RESULT CALLED TO, READ BACK BY AND VERIFIED WITH: G. Aris Lot PHARMD, AT [id number] 05/02/22 D. VANHOOK    Culture (A)  Final    CORYNEBACTERIUM PSEUDODIPHTHERIAE Standardized susceptibility testing for this organism is not available. Performed at [facility], [place] [address], [place], [place] [address]    Report Status 05/04/2022 FINAL  Final  Blood Culture ID Panel (Reflexed)     Status: Abnormal   Collection Time: 05/01/22  6:57 AM  Result Value Ref Range Status   Enterococcus faecalis NOT DETECTED NOT DETECTED Final   Enterococcus Faecium NOT DETECTED NOT DETECTED Final   Listeria monocytogenes NOT DETECTED NOT DETECTED Final   Staphylococcus species DETECTED (A) NOT DETECTED Final    Comment: CRITICAL RESULT CALLED TO, READ BACK BY AND VERIFIED WITH: V BRYK,[email or web address]  05/02/22 [place]    Staphylococcus aureus (BCID) NOT DETECTED NOT DETECTED Final   Staphylococcus epidermidis NOT DETECTED NOT DETECTED Final   Staphylococcus lugdunensis NOT DETECTED NOT DETECTED Final   Streptococcus species NOT DETECTED NOT DETECTED Final   Streptococcus agalactiae NOT DETECTED NOT DETECTED Final   Streptococcus pneumoniae NOT DETECTED NOT DETECTED Final   Streptococcus pyogenes NOT DETECTED NOT DETECTED Final   A.calcoaceticus-baumannii NOT DETECTED NOT DETECTED Final   Bacteroides fragilis NOT DETECTED NOT DETECTED Final   Enterobacterales NOT DETECTED NOT DETECTED Final   Enterobacter cloacae complex NOT DETECTED NOT DETECTED Final   Escherichia coli NOT DETECTED NOT DETECTED Final   Klebsiella aerogenes NOT DETECTED NOT DETECTED Final   Klebsiella oxytoca NOT DETECTED NOT DETECTED Final   Klebsiella pneumoniae NOT DETECTED NOT DETECTED Final   Proteus species NOT DETECTED NOT DETECTED Final   Salmonella species NOT DETECTED NOT DETECTED Final   Serratia marcescens NOT DETECTED NOT DETECTED Final   Haemophilus influenzae NOT DETECTED NOT DETECTED Final   Neisseria meningitidis NOT DETECTED NOT DETECTED Final   Pseudomonas aeruginosa NOT DETECTED NOT DETECTED Final   Stenotrophomonas maltophilia NOT DETECTED NOT DETECTED Final   Candida albicans NOT DETECTED NOT DETECTED Final   Candida auris NOT DETECTED NOT DETECTED Final   Candida  glabrata NOT DETECTED NOT DETECTED Final   Candida krusei NOT DETECTED NOT DETECTED Final   Candida parapsilosis NOT DETECTED NOT DETECTED Final   Candida tropicalis NOT DETECTED NOT DETECTED Final   Cryptococcus neoformans/gattii NOT DETECTED NOT DETECTED Final    Comment: Performed at [facility], [address] [place],  [address]  Culture, blood (Routine X 2) w Reflex to ID Panel     Status: None (Preliminary result)   Collection Time: 05/02/22  8:52 AM   Specimen: BLOOD RIGHT HAND  Result Value Ref Range Status   Specimen Description BLOOD RIGHT HAND  Final   Special Requests   Final    BOTTLES DRAWN  AEROBIC AND ANAEROBIC Blood Culture adequate volume   Culture   Final    NO GROWTH 2 DAYS Performed at [facility], [place] [address] [place], [place] [address]    Report Status PENDING  Incomplete  Culture, blood (Routine X 2) w Reflex to ID Panel     Status: None (Preliminary result)   Collection Time: 05/02/22  9:54 AM   Specimen: BLOOD  Result Value Ref Range Status   Specimen Description BLOOD LEFT ANTECUBITAL  Final   Special Requests   Final    BOTTLES DRAWN AEROBIC AND ANAEROBIC Blood Culture adequate volume   Culture   Final    NO GROWTH 2 DAYS Performed at [facility], [place] [address] [place], [place] [address]    Report Status PENDING  Incomplete      Studies: No results found.  Scheduled Meds:  allopurinol  150 mg Oral QPM   vitamin C  500 mg Oral Daily   dextromethorphan-guaiFENesin  1 tablet Oral BID   dofetilide  250 mcg Oral BID   furosemide  40 mg Oral BID   insulin aspart  0-15 Units Subcutaneous TID AC & HS   insulin aspart  3 Units Subcutaneous TID WC   ipratropium-albuterol  3 mL Nebulization Once   metoprolol tartrate  50 mg Oral BID   molnupiravir EUA  4 capsule Oral BID   pantoprazole  40 mg Oral Daily   [START ON 05/05/2022] predniSONE  50 mg Oral Q breakfast   rosuvastatin  5 mg Oral Daily   sodium chloride flush   3 mL Intravenous Q12H   Warfarin - Pharmacist Dosing Inpatient   Does not apply q1600   zinc sulfate  220 mg Oral Daily    Continuous Infusions:     LOS: 3 days     Alma Friendly, MD [facility]  If 7PM-7AM, please contact night-coverage [email or web address] 05/04/2022, 6:15 PM

## 2022-05-04 NOTE — Progress Notes (Signed)
I have sent a message to our office's scheduling team requesting a follow-up appointment, and our office will call the patient with this information.

## 2022-05-04 NOTE — Progress Notes (Signed)
RT went to apply night CPAP per order pt refused due to congestion and would not like to wear at this time. Pt also states she has not worn it due to her congestion the last few days. RT will continue to monitor.

## 2022-05-04 NOTE — Progress Notes (Signed)
Physical Therapy Treatment Patient Details Name: Natasha Glass MRN: [id number] DOB: 10/26/1940 Today's Date: 05/04/2022   History of Present Illness 81 y.o. female presents to [facility] hospital on 04/30/2022 with episodic weakness and lightheadedness. Pt experienced a fall on 04/29/2022 prior to admission. PMH inlcudes CHF, HTN, DMII, gout, DVT/PE, OSA, PAF.    PT Comments    Pt received in supine, agreeable to therapy session with encouragement and with good participation and tolerance for short household distance gait trial with RW. Pt making good progress toward goals and agreeable to sit up in chair (alarm on for safety) and to use IS after discussion of technique/frequency. VSS on RA. Pt continues to benefit from PT services to progress toward functional mobility goals.    Recommendations for follow up therapy are one component of a multi-disciplinary discharge planning process, led by the attending physician.  Recommendations may be updated based on patient status, additional functional criteria and insurance authorization.  Follow Up Recommendations  Home health PT     Assistance Recommended at Discharge Intermittent Supervision/Assistance  Patient can return home with the following A little help with walking and/or transfers;A little help with bathing/dressing/bathroom;Help with stairs or ramp for entrance;Assist for transportation;Assistance with cooking/housework   Equipment Recommendations       Recommendations for Other Services       Precautions / Restrictions Precautions Precautions: Fall Precaution Comments: monitor HR and sats; Airborne/Contact precs Required Braces or Orthoses:  (KI in room from ED but pt without buckling so kept off) Restrictions Weight Bearing Restrictions: No     Mobility  Bed Mobility Overal bed mobility: Needs Assistance Bed Mobility: Supine to Sit, Sit to Supine     Supine to sit: Min assist, HOB elevated Sit to supine: Mod assist    General bed mobility comments: use of rails, increased effort, assist for BLE with return to supine    Transfers Overall transfer level: Modified independent Equipment used: Rolling walker (2 wheels) Transfers: Sit to/from Stand Sit to Stand: From elevated surface, Min guard                Ambulation/Gait Ambulation/Gait assistance: Min guard Gait Distance (Feet): 40 Feet Assistive device: Rolling walker (2 wheels) Gait Pattern/deviations: Step-through pattern, Decreased step length - right, Decreased step length - left Gait velocity: reduced     General Gait Details: pt with coughing/DOE 1-2/4 with exertion, improves with standing breaks; SpO2 92-94% on RA with exertion, HR to 100-115 bpm   Stairs             Wheelchair Mobility    Modified Rankin (Stroke Patients Only)       Balance Overall balance assessment: Needs assistance Sitting-balance support: Feet supported Sitting balance-Leahy Scale: Good     Standing balance support: Bilateral upper extremity supported, During functional activity, Reliant on assistive device for balance Standing balance-Leahy Scale: Poor                              Cognition Arousal/Alertness: Awake/alert Behavior During Therapy: WFL for tasks assessed/performed Overall Cognitive Status: Within Functional Limits for tasks assessed                                 General Comments: cooperative, pt initially states "I was told not to get OOB today" but per cardiologist not true, pt likely was told not to get  OOB unassisted; chair alarm placed for pt safety.        Exercises Other Exercises Other Exercises: IS x 10 reps mostly 300-500 mL reviewed improved technique and frequency    General Comments General comments (skin integrity, edema, etc.): ice pack given due to c/o increased R knee pain, reviewed 15-20 mins on/off schedule      Pertinent Vitals/Pain Pain Assessment Pain Assessment:  Faces Faces Pain Scale: Hurts little more Pain Location: R knee Pain Descriptors / Indicators: Grimacing Pain Intervention(s): Monitored during session, Limited activity within patient's tolerance, Ice applied           PT Goals (current goals can now be found in the care plan section) Acute Rehab PT Goals Patient Stated Goal: to return to independent mobility PT Goal Formulation: With patient Time For Goal Achievement: 05/15/22 Progress towards PT goals: Progressing toward goals    Frequency    Min 3X/week      PT Plan Current plan remains appropriate       AM-PAC PT "6 Clicks" Mobility   Outcome Measure  Help needed turning from your back to your side while in a flat bed without using bedrails?: A Little Help needed moving from lying on your back to sitting on the side of a flat bed without using bedrails?: A Little Help needed moving to and from a bed to a chair (including a wheelchair)?: A Little Help needed standing up from a chair using your arms (e.g., wheelchair or bedside chair)?: A Little Help needed to walk in hospital room?: A Little Help needed climbing 3-5 steps with a railing? : Total 6 Click Score: 16    End of Session Equipment Utilized During Treatment: Gait belt Activity Tolerance: Patient tolerated treatment well Patient left: in chair;with call bell/phone within reach;with chair alarm set Nurse Communication: Mobility status;Other (comment) (pt now on chair alarm) PT Visit Diagnosis: Other abnormalities of gait and mobility (R26.89);Muscle weakness (generalized) (M62.81);History of falling (Z91.81)     Time: 4097-3532 PT Time Calculation (min) (ACUTE ONLY): 31 min  Charges:  $Gait Training: 8-22 mins $Therapeutic Activity: 8-22 mins                     Lain Tetterton P., PTA Acute Rehabilitation Services Secure Chat Preferred 9a-5:30pm Office: [place] 05/04/2022, 12:31 PM

## 2022-05-04 NOTE — Progress Notes (Signed)
Progress Note  Patient Name: Natasha Glass Date of Encounter: 05/04/2022  [facility] Cardiologist: Stanford Breed / [place]   Subjective   81 year old female with a history of hypertension, diabetes mellitus, diastolic congestive heart failure, DVT/PE, obstructive sleep apnea on CPAP, persistent atrial fibrillation, ascending aortic aneurysm  Saw in consultation several days ago for persistent atrial fibrillation with a rapid ventricular response.  Natasha Glass was admitted with several days of generalized malaise, weakness, chest discomfort.  She was in rapid atrial fibrillation.  She was found to be positive for COVID-19.  No cp, Breathing is ok No fever    Inpatient Medications    Scheduled Meds:  allopurinol  150 mg Oral QPM   vitamin C  500 mg Oral Daily   dofetilide  250 mcg Oral BID   furosemide  40 mg Oral Daily   insulin aspart  0-15 Units Subcutaneous TID AC & HS   insulin aspart  3 Units Subcutaneous TID WC   ipratropium-albuterol  3 mL Nebulization Once   metoprolol tartrate  50 mg Oral BID   molnupiravir EUA  4 capsule Oral BID   pantoprazole  40 mg Oral Daily   [START ON 05/05/2022] predniSONE  50 mg Oral Q breakfast   rosuvastatin  5 mg Oral Daily   sodium chloride flush  3 mL Intravenous Q12H   Warfarin - Pharmacist Dosing Inpatient   Does not apply q1600   zinc sulfate  220 mg Oral Daily   Continuous Infusions:  PRN Meds: acetaminophen **OR** acetaminophen, albuterol, guaiFENesin-dextromethorphan, hydrALAZINE, ondansetron **OR** ondansetron (ZOFRAN) IV, polyethylene glycol   Vital Signs    Vitals:   05/03/22 2015 05/03/22 2315 05/04/22 0610 05/04/22 0816  BP: 111/80 (!) 158/70 (!) 142/73 (!) 133/95  Pulse: 94 93 90   Resp: (!) 23 16 17 17   Temp: 98.3 F (36.8 C) (!) 97.3 F (36.3 C) 97.8 F (36.6 C) 97.9 F (36.6 C)  TempSrc: Oral Oral Oral Oral  SpO2: 95%  96% 94%  Weight:      Height:        Intake/Output Summary (Last 24 hours) at  05/04/2022 1122 Last data filed at 05/04/2022 0610 Gross per 24 hour  Intake 720 ml  Output 950 ml  Net -230 ml      05/03/2022    1:18 AM 05/02/2022    3:28 AM 05/01/2022    6:15 AM  Last 3 Weights  Weight (lbs) 269 lb 2.9 oz 266 lb 15.6 oz 280 lb 13.9 oz  Weight (kg) 122.1 kg 121.1 kg 127.4 kg      Telemetry    Afib with HR 90-100 - Personally Reviewed  ECG     - Personally Reviewed  Physical Exam   GEN:  elderly , morbidly obese female,  lying in bed.   Neck: No JVD Cardiac: irreg. Irreg.  Respiratory: Clear to auscultation bilaterally. GI: Soft, nontender, non-distended  MS: No edema; No deformity. Neuro:  Nonfocal  Psych: Normal affect   Labs    High Sensitivity Troponin:   Recent Labs  Lab 04/30/22 1311 04/30/22 1625  TROPONINIHS 17 16     Chemistry Recent Labs  Lab 05/02/22 0702 05/03/22 0253 05/04/22 0222  NA 136 137 139  K 3.2* 3.6 3.5  CL 99 107 104  CO2 23 23 26   GLUCOSE 181* 175* 210*  BUN 22 27* 29*  CREATININE 0.74 0.78 1.01*  CALCIUM 8.8* 8.8* 8.6*  MG 1.7 2.0 2.0  PROT 6.5  6.4* 6.2*  ALBUMIN 2.7* 2.7* 2.9*  AST 39 31 23  ALT 16 16 17   ALKPHOS 40 39 38  BILITOT 0.6 0.6 0.5  GFRNONAA >60 >60 56*  ANIONGAP 14 7 9     Lipids No results for input(s): "CHOL", "TRIG", "HDL", "LABVLDL", "LDLCALC", "CHOLHDL" in the last 168 hours.  Hematology Recent Labs  Lab 05/02/22 0702 05/03/22 0253 05/04/22 0222  WBC 5.7 12.8* 13.3*  RBC 4.34 4.32 4.23  HGB 12.2 12.1 11.8*  HCT 37.2 37.6 37.5  MCV 85.7 87.0 88.7  MCH 28.1 28.0 27.9  MCHC 32.8 32.2 31.5  RDW 16.7* 16.4* 16.6*  PLT 116* 142* 179   Thyroid  Recent Labs  Lab 05/01/22 0657  TSH 2.121    BNP Recent Labs  Lab 04/30/22 1311  BNP 150.1*    DDimer  Recent Labs  Lab 05/02/22 0702 05/03/22 0253 05/04/22 0222  DDIMER 0.60* 0.63* 0.45     Radiology    No results found.  Cardiac Studies      Patient Profile     81 y.o. female    [name]     1.   Atrial fib:   has persistent Afib.  Developed RVR when she cought COVID. Overall, she is tolerating the COVID quite well. Cont current meds. She will follow up with [place] as an OP. Continue tikosyn Continue metoprolol 50 BID  Warfarin - managed by coumadin clinic   2.  COVID - 19:   overall does not appear very toxic.  Further management per IM   3.  Morbid obesity :    [facility] will sign off.   Medication Recommendations:  as above  Other recommendations (labs, testing, etc):   Follow up as an outpatient:  with Dr. Stanford Breed or APP in several weeks .     For questions or updates, please contact [place] Please consult [email or web address] for contact info under        Signed, Mertie Moores, MD  05/04/2022, 11:22 AM

## 2022-05-04 NOTE — Progress Notes (Signed)
[place] for warfarin Indication: atrial fibrillation  Allergies  Allergen Reactions   Bactrim [Sulfamethoxazole-Trimethoprim]     Drop in BP   Morphine And Related Other (See Comments)    Unresponsive-Very bad reaction.  Can take hydrocodone.    Oysters [Shellfish Allergy] Shortness Of Breath, Itching, Swelling and Rash   Amlodipine Swelling and Other (See Comments)    Of legs   Percocet [Oxycodone-Acetaminophen] Other (See Comments)    hallucinations   Tdap [Tetanus-Diphth-Acell Pertussis] Itching, Swelling and Rash   Tetanus Toxoids Itching, Swelling and Rash   Tramadol Swelling and Other (See Comments)    Of legs   Carbamazepine Other (See Comments)   Lactose Intolerance (Gi)    Macrodantin [Nitrofurantoin] Other (See Comments)   Other Other (See Comments)   Tetanus-Diphtheria Toxoids Td Other (See Comments)   Topiramate Other (See Comments)   Diovan [Valsartan] Other (See Comments)    Cough   Diphtheria Toxoid Itching, Swelling and Rash   Macrodantin [Nitrofurantoin Macrocrystal] Other (See Comments)    unspecified    Patient Measurements: Height: 5' 3"  (160 cm) Weight: 122.1 kg (269 lb 2.9 oz) IBW/kg (Calculated) : 52.4 Heparin Dosing Weight: 84kg  Vital Signs: Temp: 97.8 F (36.6 C) (07/22 0610) Temp Source: Oral (07/22 0610) BP: 142/73 (07/22 0610) Pulse Rate: 90 (07/22 0610)  Labs: Recent Labs    05/01/22 2115 05/01/22 2115 05/02/22 0702 05/03/22 0253 05/04/22 0222  HGB  --    < > 12.2 12.1 11.8*  HCT  --   --  37.2 37.6 37.5  PLT  --   --  116* 142* 179  LABPROT 22.7*  --  22.7* 30.9* 39.0*  INR 2.0*  --  2.0* 3.0* 4.0*  HEPARINUNFRC 0.88*  --  >1.10*  --   --   CREATININE  --   --  0.74 0.78 1.01*   < > = values in this interval not displayed.     Estimated Creatinine Clearance: 56.3 mL/min (A) (by C-G formula based on SCr of 1.01 mg/dL (H)).   Assessment: 68 YOF presenting with generalized weakness  with intermittent chest pain, in afib, on warfarin PTA with INR 1.8 on admission (last dose 7/17). Pt started on IV heparin upon admission. Pharmacy consulted to restart coumadin 7/19. PTA dosing: 21m daily except 2.571mMondays  INR up to 4 today  Goal of Therapy:  INR 2-3 Monitor platelets by anticoagulation protocol: Yes   Plan:  No warfarin today Daily INR  Thank you LiAnette GuarneriPharmD Please check AMION for all [facility] numbers 05/04/2022

## 2022-05-05 DIAGNOSIS — I10 Essential (primary) hypertension: Secondary | ICD-10-CM | POA: Diagnosis not present

## 2022-05-05 DIAGNOSIS — I5032 Chronic diastolic (congestive) heart failure: Secondary | ICD-10-CM | POA: Diagnosis not present

## 2022-05-05 DIAGNOSIS — U071 COVID-19: Secondary | ICD-10-CM | POA: Diagnosis not present

## 2022-05-05 DIAGNOSIS — I4891 Unspecified atrial fibrillation: Secondary | ICD-10-CM | POA: Diagnosis not present

## 2022-05-05 LAB — CBC WITH DIFFERENTIAL/PLATELET
Abs Immature Granulocytes: 0.23 10*3/uL — ABNORMAL HIGH (ref 0.00–0.07)
Basophils Absolute: 0 10*3/uL (ref 0.0–0.1)
Basophils Relative: 0 %
Eosinophils Absolute: 0 10*3/uL (ref 0.0–0.5)
Eosinophils Relative: 0 %
HCT: 35.2 % — ABNORMAL LOW (ref 36.0–46.0)
Hemoglobin: 11.1 g/dL — ABNORMAL LOW (ref 12.0–15.0)
Immature Granulocytes: 2 %
Lymphocytes Relative: 7 %
Lymphs Abs: 0.8 10*3/uL (ref 0.7–4.0)
MCH: 28 pg (ref 26.0–34.0)
MCHC: 31.5 g/dL (ref 30.0–36.0)
MCV: 88.9 fL (ref 80.0–100.0)
Monocytes Absolute: 0.3 10*3/uL (ref 0.1–1.0)
Monocytes Relative: 3 %
Neutro Abs: 9.5 10*3/uL — ABNORMAL HIGH (ref 1.7–7.7)
Neutrophils Relative %: 88 %
Platelets: 180 10*3/uL (ref 150–400)
RBC: 3.96 MIL/uL (ref 3.87–5.11)
RDW: 16.7 % — ABNORMAL HIGH (ref 11.5–15.5)
WBC: 10.8 10*3/uL — ABNORMAL HIGH (ref 4.0–10.5)
nRBC: 0 % (ref 0.0–0.2)

## 2022-05-05 LAB — COMPREHENSIVE METABOLIC PANEL
ALT: 27 U/L (ref 0–44)
AST: 34 U/L (ref 15–41)
Albumin: 2.8 g/dL — ABNORMAL LOW (ref 3.5–5.0)
Alkaline Phosphatase: 41 U/L (ref 38–126)
Anion gap: 11 (ref 5–15)
BUN: 45 mg/dL — ABNORMAL HIGH (ref 8–23)
CO2: 26 mmol/L (ref 22–32)
Calcium: 8.6 mg/dL — ABNORMAL LOW (ref 8.9–10.3)
Chloride: 101 mmol/L (ref 98–111)
Creatinine, Ser: 1.04 mg/dL — ABNORMAL HIGH (ref 0.44–1.00)
GFR, Estimated: 54 mL/min — ABNORMAL LOW (ref >60)
Glucose, Bld: 193 mg/dL — ABNORMAL HIGH (ref 70–99)
Potassium: 3.6 mmol/L (ref 3.5–5.1)
Sodium: 138 mmol/L (ref 135–145)
Total Bilirubin: 0.8 mg/dL (ref 0.3–1.2)
Total Protein: 6 g/dL — ABNORMAL LOW (ref 6.5–8.1)

## 2022-05-05 LAB — D-DIMER, QUANTITATIVE: D-Dimer, Quant: 2.91 ug/mL-FEU — ABNORMAL HIGH (ref 0.00–0.50)

## 2022-05-05 LAB — PROTIME-INR
INR: 3.9 — ABNORMAL HIGH (ref 0.8–1.2)
Prothrombin Time: 37.6 seconds — ABNORMAL HIGH (ref 11.4–15.2)

## 2022-05-05 LAB — GLUCOSE, CAPILLARY
Glucose-Capillary: 189 mg/dL — ABNORMAL HIGH (ref 70–99)
Glucose-Capillary: 199 mg/dL — ABNORMAL HIGH (ref 70–99)

## 2022-05-05 LAB — MAGNESIUM: Magnesium: 1.9 mg/dL (ref 1.7–2.4)

## 2022-05-05 LAB — C-REACTIVE PROTEIN: CRP: 2 mg/dL — ABNORMAL HIGH (ref <1.0)

## 2022-05-05 MED ORDER — POTASSIUM CHLORIDE CRYS ER 20 MEQ PO TBCR
40.0000 meq | EXTENDED_RELEASE_TABLET | Freq: Once | ORAL | Status: AC
  Administered 2022-05-05: 40 meq via ORAL
  Filled 2022-05-05: qty 2

## 2022-05-05 MED ORDER — DM-GUAIFENESIN ER 30-600 MG PO TB12: 1.0000 | ORAL_TABLET | Freq: Two times a day (BID) | ORAL | 0 refills | Status: AC

## 2022-05-05 MED ORDER — METOPROLOL TARTRATE 25 MG PO TABS: 50.0000 mg | ORAL_TABLET | Freq: Two times a day (BID) | ORAL | 1 refills | Status: DC

## 2022-05-05 MED ORDER — ASCORBIC ACID 500 MG PO TABS
500.0000 mg | ORAL_TABLET | Freq: Every day | ORAL | 0 refills | Status: AC
Start: 2022-05-06 — End: 2022-06-05

## 2022-05-05 MED ORDER — MOLNUPIRAVIR EUA 200MG CAPSULE: 4.0000 | ORAL_CAPSULE | Freq: Two times a day (BID) | ORAL | 0 refills | Status: AC

## 2022-05-05 MED ORDER — ZINC SULFATE 220 (50 ZN) MG PO CAPS: 220.0000 mg | ORAL_CAPSULE | Freq: Every day | ORAL | 0 refills | Status: DC

## 2022-05-05 MED ORDER — MAGNESIUM SULFATE 2 GM/50ML IV SOLN
2.0000 g | Freq: Once | INTRAVENOUS | Status: AC
  Administered 2022-05-05: 2 g via INTRAVENOUS
  Filled 2022-05-05: qty 50

## 2022-05-05 MED ORDER — PREDNISONE 50 MG PO TABS: 50.0000 mg | ORAL_TABLET | Freq: Every day | ORAL | 0 refills | Status: AC

## 2022-05-05 NOTE — Discharge Summary (Addendum)
Physician Discharge Summary   Patient: Natasha Glass MRN: [id number] DOB: [birth year]  Admit date:     04/30/2022  Discharge date: 05/05/22  Discharge Physician: Natasha Glass   PCP: Natasha Huddle, MD   Recommendations at discharge:   Follow-up with PCP in 1 week Follow-up with cardiology as scheduled   Discharge Diagnoses: Principal Problem:   Atrial fibrillation with rapid ventricular response ([place]) Active Problems:   COVID-19 virus infection   Fall at home, initial encounter   Chronic diastolic (congestive) heart failure (HCC)   Chest pain   Type 2 diabetes mellitus without complication, without long-term current use of insulin (HCC)   Essential hypertension   Mixed diabetic hyperlipidemia associated with type 2 diabetes mellitus (HCC)   OSA on CPAP   GERD without esophagitis   Gout   Atrial fibrillation with RVR [facility])    Hospital Course: 81 year old female with a past medical history of diastolic congestive heart failure, hypertension, non-insulin-dependent diabetes mellitus type 2, gout, prior history of DVT/PE, obstructive sleep apnea on CPAP and paroxysmal atrial fibrillation (On Warfarin/Tikosyn) and gastroesophageal reflux disease who presented to [facility] emergency department with complaints of episodic weakness and lightheadedness, because of patient's legs being so weak and unable to bear weight and falling to the floor.  Patient has also noted nonproductive cough.  In the ED found to be in A-fib with RVR as well as positive for COVID.  Cardiology consulted.    Today, patient denies any new complaints, still coughing, but notes some improvement.  Patient denies any chest pain, worsening shortness of breath, abdominal pain, nausea/vomiting, fever/chills.  Patient continues to ambulate without any issues.  Advised to quarantine for another 5 days at home and wear mask for follow-up visits.    Assessment and Plan:  Paroxysmal atrial fibrillation  with rapid ventricular response (HCC) HR improved Likely in the setting of COVID infection Echo showed EF of 70 to 75%, no regional wall motion abnormalities, left ventricular diastolic parameters are indeterminate Cardiology consulted, continue home dose of Tikosyn, increase metoprolol If HR becomes uncontrolled again, may need cardioversion after she recovers from [place] as well as TEE prior to cardioversion Continue PTA warfarin  Follow up with cardiology   COVID-19 virus infection Acute hypoxic respiratory failure- resolved, now on RA Currently saturating around 96% on RA, weaned off O2 Chest x-ray unremarkable BC x2, urine coag negative staph in 1 bottle, and gram-positive rod in the other sets, both likely contaminants.  Repeat BC x2 NGTD Complete 5-day course of Paxlovid, last dose on 05/05/22 Complete 10 days of steroids, had 3 days of IV Solu-Medrol Continue bronchodilators, cough suppressants PCP follow up   Hypokalemia Replace as needed   Fall at home, initial encounter Weakness likely secondary to COVID-19 infection with bouts of rapid atrial fibrillation Complains of right knee pain have been evaluated with both x-ray and CT imaging of the right knee which revealed no evidence of damage to the hardware or bony injury PT/OT-recommend [facility] PT/OT   Chronic diastolic (congestive) heart failure (HCC) No clinical evidence of cardiogenic volume overload Continue Lasix Follow up with cardiology    Type 2 diabetes mellitus without complication, without long-term current use of insulin (HCC) A1c 6.4 Continue home regimen   Essential hypertension BP stable   Mixed diabetic hyperlipidemia associated with type 2 diabetes mellitus ([place]) Continuing home regimen of lipid lowering therapy   OSA on CPAP Continuing home regimen of CPAP nightly   Gout Continuing home regimen  of allopurinol   Morbid obesity Lifestyle modification advised    Pain control - [place]  Controlled Substance Reporting System database was reviewed. and patient was instructed, not to drive, operate heavy machinery, perform activities at heights, swimming or participation in water activities or provide baby-sitting services while on Pain, Sleep and Anxiety Medications; until their outpatient Physician has advised to do so again. Also recommended to not to take more than prescribed Pain, Sleep and Anxiety Medications.    Consultants: Cardiology Procedures performed: None Disposition: Home health Diet recommendation:  Cardiac and Carb modified diet    DISCHARGE MEDICATION: Allergies as of 05/05/2022       Reactions   Bactrim [sulfamethoxazole-trimethoprim]    Drop in BP   Morphine And Related Other (See Comments)   Unresponsive-Very bad reaction.  Can take hydrocodone.    Oysters [shellfish Allergy] Shortness Of Breath, Itching, Swelling, Rash   Amlodipine Swelling, Other (See Comments)   Of legs   Percocet [oxycodone-acetaminophen] Other (See Comments)   hallucinations   Tdap [tetanus-diphth-acell Pertussis] Itching, Swelling, Rash   Tetanus Toxoids Itching, Swelling, Rash   Tramadol Swelling, Other (See Comments)   Of legs   Carbamazepine Other (See Comments)   Lactose Intolerance (gi)    Macrodantin [nitrofurantoin] Other (See Comments)   Other Other (See Comments)   Tetanus-diphtheria Toxoids Td Other (See Comments)   Topiramate Other (See Comments)   Diovan [valsartan] Other (See Comments)   Cough   Diphtheria Toxoid Itching, Swelling, Rash   Macrodantin [nitrofurantoin Macrocrystal] Other (See Comments)   unspecified        Medication List     STOP taking these medications    diltiazem 30 MG tablet Commonly known as: CARDIZEM       TAKE these medications    acetaminophen 500 MG tablet Commonly known as: TYLENOL Take 500 mg by mouth at bedtime.   allopurinol 300 MG tablet Commonly known as: ZYLOPRIM Take 0.5 tablets (150 mg total) by mouth  every evening.   ascorbic acid 500 MG tablet Commonly known as: VITAMIN C Take 1 tablet (500 mg total) by mouth daily. Start taking on: May 06, 2022   clobetasol cream 0.05 % Commonly known as: TEMOVATE Apply 1 application topically as needed.   dextromethorphan-guaiFENesin 30-600 MG 12hr tablet Commonly known as: MUCINEX DM Take 1 tablet by mouth 2 (two) times daily for 7 days.   dofetilide 250 MCG capsule Commonly known as: TIKOSYN Take 1 capsule (250 mcg total) by mouth 2 (two) times daily.   furosemide 40 MG tablet Commonly known as: LASIX Take one tablet by mouth daily, every other day add extra 1/2 tablet daily   losartan 25 MG tablet Commonly known as: COZAAR Take 1 tablet (25 mg total) by mouth daily.   magnesium oxide 400 MG tablet Commonly known as: MAG-OX Take 1 tablet (400 mg total) by mouth at bedtime.   metFORMIN 500 MG tablet Commonly known as: GLUCOPHAGE Take 1 tablet (500 mg total) by mouth 2 (two) times daily with a meal.   metoprolol tartrate 25 MG tablet Commonly known as: LOPRESSOR Take 2 tablets (50 mg total) by mouth 2 (two) times daily. What changed: how much to take   molnupiravir EUA 200 mg Caps capsule Commonly known as: LAGEVRIO Take 4 capsules (800 mg total) by mouth 2 (two) times daily for 5 days.   Naphazoline-Hydroxyprop Mcell 0.03-0.5 % Soln Place 1 drop into both eyes 2 (two) times daily.   ondansetron 8 MG  tablet Commonly known as: ZOFRAN Take 1 tablet (8 mg total) by mouth every 6 (six) hours as needed for nausea or vomiting.   OneTouch Delica Plus [id number] Misc Apply 1 each topically 3 (three) times daily.   OneTouch Delica Plus Lancing Misc   OneTouch Ultra test strip Generic drug: glucose blood   potassium chloride SA 20 MEQ tablet Commonly known as: KLOR-CON M Take 1.5 tablets (30 mEq total) by mouth daily.   predniSONE 50 MG tablet Commonly known as: DELTASONE Take 1 tablet (50 mg total) by mouth daily with  breakfast for 5 days. Start taking on: May 06, 2022   PreserVision AREDS 2 Caps Take 2 capsules by mouth daily.   rosuvastatin 5 MG tablet Commonly known as: CRESTOR Take 5 mg by mouth daily.   vitamin B-12 500 MCG tablet Commonly known as: CYANOCOBALAMIN Take 500 mcg by mouth daily.   Vitamin D3 25 MCG (1000 UT) Caps Take 3 capsules by mouth daily in the afternoon.   warfarin 5 MG tablet Commonly known as: COUMADIN Take 1 tablet (5 mg total) by mouth daily. 5 mg daily except for Monday take 1/2 tablet   zinc sulfate 220 (50 Zn) MG capsule Take 1 capsule (220 mg total) by mouth daily. Start taking on: May 06, 2022        [place], [facility] Follow up.   Why: Physical Therapy-Occupational Therapy-office to call with a visit times Contact information: [address] [place] [address] [phone]         Lelon Perla, MD Follow up.   Specialty: Cardiology Why: [facility] - the cardiology office will call you to arrange a follow-up visit. Contact information: [address] [place] [address] [phone]         Natasha Huddle, MD. Schedule an appointment as soon as possible for a visit in 1 week(s).   Specialty: Internal Medicine Contact information: [address] [place] [address] [phone]         Thompson Grayer, MD .   Specialty: Cardiology Contact information: [address] [place] [address] [phone]                Discharge Exam: Danley Danker Weights   05/01/22 0615 05/02/22 0328 05/03/22 0118  Weight: 127.4 kg 121.1 kg 122.1 kg   General: NAD, morbidly obese Cardiovascular: S1, S2 present Respiratory: Diminished breath sounds bilaterally Abdomen: Soft, nontender, nondistended, bowel sounds present Musculoskeletal: No bilateral pedal edema noted, chronic venous stasis changes noted on bilateral lower extremity Skin: As  noted above Psychiatry: Normal mood   Condition at discharge: stable  The results of significant diagnostics from this hospitalization (including imaging, microbiology, ancillary and laboratory) are listed below for reference.   Imaging Studies: ECHOCARDIOGRAM COMPLETE  Result Date: 05/01/2022    ECHOCARDIOGRAM REPORT   Patient Name:   Elysha Daw Achord Date of Exam: 05/01/2022 Medical Rec #:  [id number]      Height:       63.0 in Accession #:    [id number]     Weight:       280.9 lb Date of Birth:  [birth year]     BSA:          2.234 m Patient Age:    10 years       BP:           102/68 mmHg Patient Gender:  F              HR:           80 bpm. Exam Location:  Inpatient Procedure: 2D Echo, Cardiac Doppler and Color Doppler Indications:    I48.91* Unspeicified atrial fibrillation  History:        Patient has prior history of Echocardiogram examinations, most                 recent 06/02/2017. Abnormal ECG, Arrythmias:Atrial Fibrillation,                 Signs/Symptoms:Chest Pain; Risk Factors:Sleep Apnea, Diabetes                 and Hypertension.  Sonographer:    Roseanna Rainbow RDCS Referring Phys: Sherryll Burger [facility]  Sonographer Comments: Patient is morbidly obese. Image acquisition challenging due to patient body habitus. IMPRESSIONS  1. Left ventricular ejection fraction, by estimation, is 70 to 75%. The left ventricle has hyperdynamic function. The left ventricle has no regional wall motion abnormalities. Left ventricular diastolic parameters are indeterminate.  2. Right ventricular systolic function is normal. The right ventricular size is normal. Tricuspid regurgitation signal is inadequate for assessing PA pressure.  3. Left atrial size was mildly dilated.  4. The mitral valve is abnormal. Trivial mitral valve regurgitation. No evidence of mitral stenosis. Severe mitral annular calcification.  5. The aortic valve is tricuspid. There is mild calcification of the aortic valve. There is mild thickening of the  aortic valve. Aortic valve regurgitation is mild. Aortic valve sclerosis/calcification is present, without any evidence of aortic stenosis.  6. Aortic dilatation noted. There is moderate dilatation of the ascending aorta, measuring 46 mm.  7. The inferior vena cava is dilated in size with >50% respiratory variability, suggesting right atrial pressure of 8 mmHg. Comparison(s): No significant change from prior study. FINDINGS  Left Ventricle: Left ventricular ejection fraction, by estimation, is 70 to 75%. The left ventricle has hyperdynamic function. The left ventricle has no regional wall motion abnormalities. The left ventricular internal cavity size was normal in size. There is no left ventricular hypertrophy. Left ventricular diastolic parameters are indeterminate. Right Ventricle: The right ventricular size is normal. No increase in right ventricular wall thickness. Right ventricular systolic function is normal. Tricuspid regurgitation signal is inadequate for assessing PA pressure. Left Atrium: Left atrial size was mildly dilated. Right Atrium: Right atrial size was normal in size. Pericardium: There is no evidence of pericardial effusion. Mitral Valve: The mitral valve is abnormal. There is mild thickening of the mitral valve leaflet(s). There is mild calcification of the mitral valve leaflet(s). Severe mitral annular calcification. Trivial mitral valve regurgitation. No evidence of mitral valve stenosis. Tricuspid Valve: The tricuspid valve is normal in structure. Tricuspid valve regurgitation is trivial. Aortic Valve: The aortic valve is tricuspid. There is mild calcification of the aortic valve. There is mild thickening of the aortic valve. Aortic valve regurgitation is mild. Aortic valve sclerosis/calcification is present, without any evidence of aortic stenosis. Pulmonic Valve: The pulmonic valve was not well visualized. Pulmonic valve regurgitation is trivial. Aorta: Aortic dilatation noted. There is  moderate dilatation of the ascending aorta, measuring 46 mm. Venous: The inferior vena cava is dilated in size with greater than 50% respiratory variability, suggesting right atrial pressure of 8 mmHg. IAS/Shunts: The atrial septum is grossly normal.  LEFT VENTRICLE PLAX 2D LVIDd:         4.20 cm LVIDs:  2.60 cm LV PW:         1.20 cm LV IVS:        0.87 cm LVOT diam:     2.20 cm LV SV:         76 LV SV Index:   34 LVOT Area:     3.80 cm  LV Volumes (MOD) LV vol d, MOD A2C: 54.2 ml LV vol d, MOD A4C: 54.4 ml LV vol s, MOD A2C: 15.8 ml LV vol s, MOD A4C: 12.3 ml LV SV MOD A2C:     38.4 ml LV SV MOD A4C:     54.4 ml LV SV MOD BP:      41.5 ml RIGHT VENTRICLE             IVC RV S prime:     14.30 cm/s  IVC diam: 2.60 cm TAPSE (M-mode): 1.7 cm LEFT ATRIUM             Index        RIGHT ATRIUM           Index LA diam:        3.30 cm 1.48 cm/m   RA Area:     13.00 cm LA Vol (A2C):   79.1 ml 35.41 ml/m  RA Volume:   26.20 ml  11.73 ml/m LA Vol (A4C):   65.0 ml 29.10 ml/m LA Biplane Vol: 74.3 ml 33.26 ml/m  AORTIC VALVE             PULMONIC VALVE LVOT Vmax:   125.50 cm/s PR End Diast Vel: 1.30 msec LVOT Vmean:  77.350 cm/s LVOT VTI:    0.200 m  AORTA Ao Root diam: 3.20 cm Ao Asc diam:  4.55 cm MITRAL VALVE MV Area (PHT): 2.63 cm     SHUNTS MV Decel Time: 289 msec     Systemic VTI:  0.20 m MV E velocity: 106.00 cm/s  Systemic Diam: 2.20 cm Gwyndolyn Kaufman MD Electronically signed by Gwyndolyn Kaufman MD Signature Date/Time: 05/01/2022/1:55:13 PM    Final    CT Knee Right Wo Contrast  Result Date: 04/30/2022 CLINICAL DATA:  Right knee trauma.  Occult fracture suspected. EXAM: CT OF THE RIGHT KNEE WITHOUT CONTRAST TECHNIQUE: Multidetector CT imaging of the right knee was performed according to the standard protocol. Multiplanar CT image reconstructions were also generated. RADIATION DOSE REDUCTION: This exam was performed according to the departmental dose-optimization program which includes automated  exposure control, adjustment of the mA and/or kV according to patient size and/or use of iterative reconstruction technique. 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 Within this limitation, no definite perihardware lucency is seen to indicate hardware failure or loosening. No definite acute fracture is identified. There is diffuse decreased bone mineralization. Ligaments Suboptimally assessed by CT. Muscles and Tendons Mild infiltration of visualized portions of the quadriceps and soleus and medial head of the gastrocnemius lateral head of the gastrocnemius muscles. No gross tendon tear is visualized. Soft tissues Streak artifact limits evaluation for a joint effusion however no large joint effusion is seen. There is mild-to-moderate edema and swelling within the subcutaneous fat of the visualized proximal calf. IMPRESSION: 1. Within the limitations of metallic streak artifact from total right knee arthroplasty hardware, no acute fracture is seen. 2. No evidence of hardware failure. Electronically Signed   By: Yvonne Kendall M.D.   On: 04/30/2022  15:27   DG Chest 2 View  Result Date: 04/30/2022 CLINICAL DATA:  Patient fell last night. Intermittent atrial fibrillation with shortness of breath and cough for several days. EXAM: CHEST - 2 VIEW COMPARISON:  Radiographs 08/26/2021.  CT 03/21/2021. FINDINGS: The heart size is stable at the upper limits of normal. The mediastinal contours are normal. Chronic central airway and interstitial thickening appears unchanged. No evidence of edema, confluent airspace opacity, pleural effusion or pneumothorax. No acute osseous findings are evident. There are degenerative changes throughout the spine. IMPRESSION: Stable chronic central airway and interstitial thickening. No acute cardiopulmonary process. Electronically Signed    By: Richardean Sale M.D.   On: 04/30/2022 13:22   DG Knee Complete 4 Views Right  Result Date: 04/30/2022 CLINICAL DATA:  Patient fell last night.  Knee pain. EXAM: RIGHT KNEE - COMPLETE 4+ VIEW COMPARISON:  None Available. FINDINGS: Status post total knee arthroplasty without evidence of hardware loosening, acute fracture or dislocation. The bones are demineralized. There is a small knee joint effusion. No focal soft tissue abnormalities or unexpected foreign bodies are identified. IMPRESSION: No evidence of acute fracture or dislocation post total knee arthroplasty. Small nonspecific knee joint effusion. Electronically Signed   By: Richardean Sale M.D.   On: 04/30/2022 13:21    Microbiology: Results for orders placed or performed during the hospital encounter of 04/30/22  Resp Panel by RT-PCR (Flu A&B, Covid) Anterior Nasal Swab     Status: Abnormal   Collection Time: 04/30/22  2:51 PM   Specimen: Anterior Nasal Swab  Result Value Ref Range Status   SARS Coronavirus 2 by RT PCR POSITIVE (A) NEGATIVE Final    Comment: (NOTE) SARS-CoV-2 target nucleic acids are DETECTED.  The SARS-CoV-2 RNA is generally detectable in upper respiratory specimens during the acute phase of infection. Positive results are indicative of the presence of the identified virus, but do not rule out bacterial infection or co-infection with other pathogens not detected by the test. Clinical correlation with patient history and other diagnostic information is necessary to determine patient infection status. The expected result is Negative.  Fact Sheet for Patients: EntrepreneurPulse.com.au  Fact Sheet for Healthcare Providers: IncredibleEmployment.be  This test is not yet approved or cleared by the [place] FDA and  has been authorized for detection and/or diagnosis of SARS-CoV-2 by FDA under an Emergency Use Authorization (EUA).  This EUA will remain in effect (meaning  this test can be used) for the duration of  the COVID-19 declaration under Section 564(b)(1) of the A ct, 21 U.S.C. section 360bbb-3(b)(1), unless the authorization is terminated or revoked sooner.     Influenza A by PCR NEGATIVE NEGATIVE Final   Influenza B by PCR NEGATIVE NEGATIVE Final    Comment: (NOTE) The Xpert Xpress SARS-CoV-2/FLU/RSV plus assay is intended as an aid in the diagnosis of influenza from Nasopharyngeal swab specimens and should not be used as a sole basis for treatment. Nasal washings and aspirates are unacceptable for Xpert Xpress SARS-CoV-2/FLU/RSV testing.  Fact Sheet for Patients: EntrepreneurPulse.com.au  Fact Sheet for Healthcare Providers: IncredibleEmployment.be  This test is not yet approved or cleared by the [place] FDA and has been authorized for detection and/or diagnosis of SARS-CoV-2 by FDA under an Emergency Use Authorization (EUA). This EUA will remain in effect (meaning this test can be used) for the duration of the COVID-19 declaration under Section 564(b)(1) of the Act, 21 U.S.C. section 360bbb-3(b)(1), unless the authorization is terminated or revoked.  Performed at [facility], [place] [address], [place], [place] [address]   Culture, blood (Routine X 2) w Reflex to ID Panel     Status: Abnormal   Collection Time: 05/01/22  6:57 AM   Specimen: BLOOD LEFT HAND  Result Value Ref Range Status   Specimen Description BLOOD LEFT HAND  Final   Special Requests   Final    BOTTLES DRAWN AEROBIC ONLY Blood Culture adequate volume   Culture  Setup Time   Final    GRAM POSITIVE COCCI AEROBIC BOTTLE ONLY Organism ID to follow CRITICAL RESULT CALLED TO, READ BACK BY AND VERIFIED WITH: V BRYK,[id number]  05/02/22 [place]    Culture (A)  Final    STAPHYLOCOCCUS SIMULANS THE SIGNIFICANCE OF ISOLATING THIS ORGANISM FROM A SINGLE SET OF BLOOD CULTURES WHEN MULTIPLE SETS ARE DRAWN IS UNCERTAIN. PLEASE NOTIFY  THE MICROBIOLOGY DEPARTMENT WITHIN ONE WEEK IF SPECIATION AND SENSITIVITIES ARE REQUIRED. Performed at [facility], [place] [address], [place], [place] [address]    Report Status 05/03/2022 FINAL  Final  Culture, blood (Routine X 2) w Reflex to ID Panel     Status: Abnormal   Collection Time: 05/01/22  6:57 AM   Specimen: BLOOD RIGHT HAND  Result Value Ref Range Status   Specimen Description BLOOD RIGHT HAND  Final   Special Requests   Final    BOTTLES DRAWN AEROBIC ONLY Blood Culture adequate volume   Culture  Setup Time   Final    GRAM POSITIVE RODS AEROBIC BOTTLE ONLY CRITICAL RESULT CALLED TO, READ BACK BY AND VERIFIED WITH: G. Aris Lot PHARMD, AT [id number] 05/02/22 D. VANHOOK    Culture (A)  Final    CORYNEBACTERIUM PSEUDODIPHTHERIAE Standardized susceptibility testing for this organism is not available. Performed at [facility], [place] [address] [place], [place] [address]    Report Status 05/04/2022 FINAL  Final  Blood Culture ID Panel (Reflexed)     Status: Abnormal   Collection Time: 05/01/22  6:57 AM  Result Value Ref Range Status   Enterococcus faecalis NOT DETECTED NOT DETECTED Final   Enterococcus Faecium NOT DETECTED NOT DETECTED Final   Listeria monocytogenes NOT DETECTED NOT DETECTED Final   Staphylococcus species DETECTED (A) NOT DETECTED Final    Comment: CRITICAL RESULT CALLED TO, READ BACK BY AND VERIFIED WITH: V BRYK,[id number]  05/02/22 [place]    Staphylococcus aureus (BCID) NOT DETECTED NOT DETECTED Final   Staphylococcus epidermidis NOT DETECTED NOT DETECTED Final   Staphylococcus lugdunensis NOT DETECTED NOT DETECTED Final   Streptococcus species NOT DETECTED NOT DETECTED Final   Streptococcus agalactiae NOT DETECTED NOT DETECTED Final   Streptococcus pneumoniae NOT DETECTED NOT DETECTED Final   Streptococcus pyogenes NOT DETECTED NOT DETECTED Final   A.calcoaceticus-baumannii NOT DETECTED NOT DETECTED Final   Bacteroides fragilis NOT DETECTED NOT DETECTED  Final   Enterobacterales NOT DETECTED NOT DETECTED Final   Enterobacter cloacae complex NOT DETECTED NOT DETECTED Final   Escherichia coli NOT DETECTED NOT DETECTED Final   Klebsiella aerogenes NOT DETECTED NOT DETECTED Final   Klebsiella oxytoca NOT DETECTED NOT DETECTED Final   Klebsiella pneumoniae NOT DETECTED NOT DETECTED Final   Proteus species NOT DETECTED NOT DETECTED Final   Salmonella species NOT DETECTED NOT DETECTED Final   Serratia marcescens NOT DETECTED NOT DETECTED Final   Haemophilus influenzae NOT DETECTED NOT DETECTED Final   Neisseria meningitidis NOT DETECTED NOT DETECTED Final   Pseudomonas aeruginosa NOT DETECTED NOT DETECTED Final   Stenotrophomonas maltophilia NOT DETECTED  NOT DETECTED Final   Candida albicans NOT DETECTED NOT DETECTED Final   Candida auris NOT DETECTED NOT DETECTED Final   Candida glabrata NOT DETECTED NOT DETECTED Final   Candida krusei NOT DETECTED NOT DETECTED Final   Candida parapsilosis NOT DETECTED NOT DETECTED Final   Candida tropicalis NOT DETECTED NOT DETECTED Final   Cryptococcus neoformans/gattii NOT DETECTED NOT DETECTED Final    Comment: Performed at [facility], [place] [address], [place], [place] [address]  Culture, blood (Routine X 2) w Reflex to ID Panel     Status: None (Preliminary result)   Collection Time: 05/02/22  8:52 AM   Specimen: BLOOD RIGHT HAND  Result Value Ref Range Status   Specimen Description BLOOD RIGHT HAND  Final   Special Requests   Final    BOTTLES DRAWN AEROBIC AND ANAEROBIC Blood Culture adequate volume   Culture   Final    NO GROWTH 3 DAYS Performed at [facility], [place] [address], [place], [place] [address]    Report Status PENDING  Incomplete  Culture, blood (Routine X 2) w Reflex to ID Panel     Status: None (Preliminary result)   Collection Time: 05/02/22  9:54 AM   Specimen: BLOOD  Result Value Ref Range Status   Specimen Description BLOOD LEFT ANTECUBITAL  Final   Special  Requests   Final    BOTTLES DRAWN AEROBIC AND ANAEROBIC Blood Culture adequate volume   Culture   Final    NO GROWTH 3 DAYS Performed at [facility], [place] [address] [place], [place] [address]    Report Status PENDING  Incomplete    Labs: CBC: Recent Labs  Lab 04/30/22 1311 05/02/22 0702 05/03/22 0253 05/04/22 0222 05/05/22 0125  WBC 7.9 5.7 12.8* 13.3* 10.8*  NEUTROABS  --  4.8 11.5* 12.2* 9.5*  HGB 13.1 12.2 12.1 11.8* 11.1*  HCT 42.5 37.2 37.6 37.5 35.2*  MCV 89.9 85.7 87.0 88.7 88.9  PLT 119* 116* 142* 179 413   Basic Metabolic Panel: Recent Labs  Lab 04/30/22 1311 05/02/22 0702 05/03/22 0253 05/04/22 0222 05/05/22 0125  NA 136 136 137 139 138  K 4.1 3.2* 3.6 3.5 3.6  CL 100 99 107 104 101  CO2 24 23 23 26 26   GLUCOSE 137* 181* 175* 210* 193*  BUN 19 22 27* 29* 45*  CREATININE 0.81 0.74 0.78 1.01* 1.04*  CALCIUM 9.4 8.8* 8.8* 8.6* 8.6*  MG  --  1.7 2.0 2.0 1.9   Liver Function Tests: Recent Labs  Lab 04/30/22 1311 05/02/22 0702 05/03/22 0253 05/04/22 0222 05/05/22 0125  AST 54* 39 31 23 34  ALT 17 16 16 17 27   ALKPHOS 50 40 39 38 41  BILITOT 0.6 0.6 0.6 0.5 0.8  PROT 7.5 6.5 6.4* 6.2* 6.0*  ALBUMIN 3.6 2.7* 2.7* 2.9* 2.8*   CBG: Recent Labs  Lab 05/04/22 1852 05/04/22 2100 05/04/22 2315 05/05/22 0754 05/05/22 1139  GLUCAP 181* 256* 226* 189* 199*    Discharge time spent: greater than 30 minutes.  Signed: Alma Friendly, MD [facility] 05/05/2022

## 2022-05-05 NOTE — Progress Notes (Signed)
Mobility Specialist Progress Note    05/05/22 1115  Mobility  Activity Ambulated with assistance in room  Level of Assistance Minimal assist, patient does 75% or more  Assistive Device Front wheel walker  Distance Ambulated (ft) 100 ft  Activity Response Tolerated well  $Mobility charge 1 Mobility   Pt received in bed and agreeable. No complaints on walk. Coughing throughout session. Returned to chair with call bell in reach.    Hildred Alamin Mobility Specialist

## 2022-05-05 NOTE — Progress Notes (Signed)
ANTICOAGULATION CONSULT NOTE- follow-up  Pharmacy Consult for warfarin Indication: atrial fibrillation  Allergies  Allergen Reactions   Bactrim [Sulfamethoxazole-Trimethoprim]     Drop in BP   Morphine And Related Other (See Comments)    Unresponsive-Very bad reaction.  Can take hydrocodone.    Oysters [Shellfish Allergy] Shortness Of Breath, Itching, Swelling and Rash   Amlodipine Swelling and Other (See Comments)    Of legs   Percocet [Oxycodone-Acetaminophen] Other (See Comments)    hallucinations   Tdap [Tetanus-Diphth-Acell Pertussis] Itching, Swelling and Rash   Tetanus Toxoids Itching, Swelling and Rash   Tramadol Swelling and Other (See Comments)    Of legs   Carbamazepine Other (See Comments)   Lactose Intolerance (Gi)    Macrodantin [Nitrofurantoin] Other (See Comments)   Other Other (See Comments)   Tetanus-Diphtheria Toxoids Td Other (See Comments)   Topiramate Other (See Comments)   Diovan [Valsartan] Other (See Comments)    Cough   Diphtheria Toxoid Itching, Swelling and Rash   Macrodantin [Nitrofurantoin Macrocrystal] Other (See Comments)    unspecified    Patient Measurements: Height: 5' 3"  (160 cm) Weight: 122.1 kg (269 lb 2.9 oz) IBW/kg (Calculated) : 52.4 Heparin Dosing Weight: 84kg  Vital Signs: Temp: 97.8 F (36.6 C) (07/23 0536) Temp Source: Oral (07/23 0536) BP: 140/74 (07/23 0536) Pulse Rate: 55 (07/23 0536)  Labs: Recent Labs    05/03/22 0253 05/04/22 0222 05/05/22 0125  HGB 12.1 11.8* 11.1*  HCT 37.6 37.5 35.2*  PLT 142* 179 180  LABPROT 30.9* 39.0* 37.6*  INR 3.0* 4.0* 3.9*  CREATININE 0.78 1.01* 1.04*     Estimated Creatinine Clearance: 54.7 mL/min (A) (by C-G formula based on SCr of 1.04 mg/dL (H)).   Assessment: 14 YOF presenting with generalized weakness with intermittent chest pain, in afib, on warfarin PTA with INR 1.8 on admission (last dose 7/17). Pt started on IV heparin upon admission. Pharmacy consulted to restart  coumadin 7/19. PTA dosing: 87m daily except 2.531mMondays  INR 3.9 today  Goal of Therapy:  INR 2-3 Monitor platelets by anticoagulation protocol: Yes   Plan:  No warfarin today Daily INR  Oryn Casanova BS, PharmD, BCPS Clinical Pharmacist 05/05/2022 9:07 AM  Contact: [phone] 3 PM  "Be curious, not judgmental..." -WaJamal Maes

## 2022-05-07 DIAGNOSIS — I5032 Chronic diastolic (congestive) heart failure: Secondary | ICD-10-CM | POA: Diagnosis not present

## 2022-05-07 DIAGNOSIS — K219 Gastro-esophageal reflux disease without esophagitis: Secondary | ICD-10-CM | POA: Diagnosis not present

## 2022-05-07 DIAGNOSIS — Z7984 Long term (current) use of oral hypoglycemic drugs: Secondary | ICD-10-CM | POA: Diagnosis not present

## 2022-05-07 DIAGNOSIS — Z86718 Personal history of other venous thrombosis and embolism: Secondary | ICD-10-CM | POA: Diagnosis not present

## 2022-05-07 DIAGNOSIS — I480 Paroxysmal atrial fibrillation: Secondary | ICD-10-CM | POA: Diagnosis not present

## 2022-05-07 DIAGNOSIS — M109 Gout, unspecified: Secondary | ICD-10-CM | POA: Diagnosis not present

## 2022-05-07 DIAGNOSIS — Z7901 Long term (current) use of anticoagulants: Secondary | ICD-10-CM | POA: Diagnosis not present

## 2022-05-07 DIAGNOSIS — E876 Hypokalemia: Secondary | ICD-10-CM | POA: Diagnosis not present

## 2022-05-07 DIAGNOSIS — I110 Hypertensive heart disease with heart failure: Secondary | ICD-10-CM | POA: Diagnosis not present

## 2022-05-07 DIAGNOSIS — E782 Mixed hyperlipidemia: Secondary | ICD-10-CM | POA: Diagnosis not present

## 2022-05-07 DIAGNOSIS — G4733 Obstructive sleep apnea (adult) (pediatric): Secondary | ICD-10-CM | POA: Diagnosis not present

## 2022-05-07 DIAGNOSIS — E1169 Type 2 diabetes mellitus with other specified complication: Secondary | ICD-10-CM | POA: Diagnosis not present

## 2022-05-07 DIAGNOSIS — U071 COVID-19: Secondary | ICD-10-CM | POA: Diagnosis not present

## 2022-05-07 DIAGNOSIS — Z9181 History of falling: Secondary | ICD-10-CM | POA: Diagnosis not present

## 2022-05-07 DIAGNOSIS — Z86711 Personal history of pulmonary embolism: Secondary | ICD-10-CM | POA: Diagnosis not present

## 2022-05-07 LAB — CULTURE, BLOOD (ROUTINE X 2)
Culture: NO GROWTH
Special Requests: ADEQUATE

## 2022-05-08 ENCOUNTER — Encounter (INDEPENDENT_AMBULATORY_CARE_PROVIDER_SITE_OTHER): Payer: Medicare Other | Admitting: Ophthalmology

## 2022-05-09 DIAGNOSIS — Z86711 Personal history of pulmonary embolism: Secondary | ICD-10-CM | POA: Diagnosis not present

## 2022-05-09 DIAGNOSIS — Z9181 History of falling: Secondary | ICD-10-CM | POA: Diagnosis not present

## 2022-05-09 DIAGNOSIS — M109 Gout, unspecified: Secondary | ICD-10-CM | POA: Diagnosis not present

## 2022-05-09 DIAGNOSIS — I480 Paroxysmal atrial fibrillation: Secondary | ICD-10-CM | POA: Diagnosis not present

## 2022-05-09 DIAGNOSIS — E782 Mixed hyperlipidemia: Secondary | ICD-10-CM | POA: Diagnosis not present

## 2022-05-09 DIAGNOSIS — I110 Hypertensive heart disease with heart failure: Secondary | ICD-10-CM | POA: Diagnosis not present

## 2022-05-09 DIAGNOSIS — K219 Gastro-esophageal reflux disease without esophagitis: Secondary | ICD-10-CM | POA: Diagnosis not present

## 2022-05-09 DIAGNOSIS — Z7901 Long term (current) use of anticoagulants: Secondary | ICD-10-CM | POA: Diagnosis not present

## 2022-05-09 DIAGNOSIS — E876 Hypokalemia: Secondary | ICD-10-CM | POA: Diagnosis not present

## 2022-05-09 DIAGNOSIS — Z7984 Long term (current) use of oral hypoglycemic drugs: Secondary | ICD-10-CM | POA: Diagnosis not present

## 2022-05-09 DIAGNOSIS — E1169 Type 2 diabetes mellitus with other specified complication: Secondary | ICD-10-CM | POA: Diagnosis not present

## 2022-05-09 DIAGNOSIS — Z86718 Personal history of other venous thrombosis and embolism: Secondary | ICD-10-CM | POA: Diagnosis not present

## 2022-05-09 DIAGNOSIS — I5032 Chronic diastolic (congestive) heart failure: Secondary | ICD-10-CM | POA: Diagnosis not present

## 2022-05-09 DIAGNOSIS — U071 COVID-19: Secondary | ICD-10-CM | POA: Diagnosis not present

## 2022-05-09 DIAGNOSIS — G4733 Obstructive sleep apnea (adult) (pediatric): Secondary | ICD-10-CM | POA: Diagnosis not present

## 2022-05-09 LAB — CULTURE, BLOOD (ROUTINE X 2): Special Requests: ADEQUATE

## 2022-05-10 DIAGNOSIS — Z9181 History of falling: Secondary | ICD-10-CM | POA: Diagnosis not present

## 2022-05-10 DIAGNOSIS — I480 Paroxysmal atrial fibrillation: Secondary | ICD-10-CM | POA: Diagnosis not present

## 2022-05-10 DIAGNOSIS — I5032 Chronic diastolic (congestive) heart failure: Secondary | ICD-10-CM | POA: Diagnosis not present

## 2022-05-10 DIAGNOSIS — Z86711 Personal history of pulmonary embolism: Secondary | ICD-10-CM | POA: Diagnosis not present

## 2022-05-10 DIAGNOSIS — K219 Gastro-esophageal reflux disease without esophagitis: Secondary | ICD-10-CM | POA: Diagnosis not present

## 2022-05-10 DIAGNOSIS — I110 Hypertensive heart disease with heart failure: Secondary | ICD-10-CM | POA: Diagnosis not present

## 2022-05-10 DIAGNOSIS — E1169 Type 2 diabetes mellitus with other specified complication: Secondary | ICD-10-CM | POA: Diagnosis not present

## 2022-05-10 DIAGNOSIS — U071 COVID-19: Secondary | ICD-10-CM | POA: Diagnosis not present

## 2022-05-10 DIAGNOSIS — E876 Hypokalemia: Secondary | ICD-10-CM | POA: Diagnosis not present

## 2022-05-10 DIAGNOSIS — Z7901 Long term (current) use of anticoagulants: Secondary | ICD-10-CM | POA: Diagnosis not present

## 2022-05-10 DIAGNOSIS — G4733 Obstructive sleep apnea (adult) (pediatric): Secondary | ICD-10-CM | POA: Diagnosis not present

## 2022-05-10 DIAGNOSIS — Z7984 Long term (current) use of oral hypoglycemic drugs: Secondary | ICD-10-CM | POA: Diagnosis not present

## 2022-05-10 DIAGNOSIS — Z86718 Personal history of other venous thrombosis and embolism: Secondary | ICD-10-CM | POA: Diagnosis not present

## 2022-05-10 DIAGNOSIS — M109 Gout, unspecified: Secondary | ICD-10-CM | POA: Diagnosis not present

## 2022-05-10 DIAGNOSIS — E782 Mixed hyperlipidemia: Secondary | ICD-10-CM | POA: Diagnosis not present

## 2022-05-14 DIAGNOSIS — Z7901 Long term (current) use of anticoagulants: Secondary | ICD-10-CM | POA: Diagnosis not present

## 2022-05-14 DIAGNOSIS — G4733 Obstructive sleep apnea (adult) (pediatric): Secondary | ICD-10-CM | POA: Diagnosis not present

## 2022-05-14 DIAGNOSIS — U071 COVID-19: Secondary | ICD-10-CM | POA: Diagnosis not present

## 2022-05-14 DIAGNOSIS — Z86711 Personal history of pulmonary embolism: Secondary | ICD-10-CM | POA: Diagnosis not present

## 2022-05-14 DIAGNOSIS — I480 Paroxysmal atrial fibrillation: Secondary | ICD-10-CM | POA: Diagnosis not present

## 2022-05-14 DIAGNOSIS — E1169 Type 2 diabetes mellitus with other specified complication: Secondary | ICD-10-CM | POA: Diagnosis not present

## 2022-05-14 DIAGNOSIS — K219 Gastro-esophageal reflux disease without esophagitis: Secondary | ICD-10-CM | POA: Diagnosis not present

## 2022-05-14 DIAGNOSIS — Z9181 History of falling: Secondary | ICD-10-CM | POA: Diagnosis not present

## 2022-05-14 DIAGNOSIS — I110 Hypertensive heart disease with heart failure: Secondary | ICD-10-CM | POA: Diagnosis not present

## 2022-05-14 DIAGNOSIS — M109 Gout, unspecified: Secondary | ICD-10-CM | POA: Diagnosis not present

## 2022-05-14 DIAGNOSIS — Z86718 Personal history of other venous thrombosis and embolism: Secondary | ICD-10-CM | POA: Diagnosis not present

## 2022-05-14 DIAGNOSIS — Z7984 Long term (current) use of oral hypoglycemic drugs: Secondary | ICD-10-CM | POA: Diagnosis not present

## 2022-05-14 DIAGNOSIS — I5032 Chronic diastolic (congestive) heart failure: Secondary | ICD-10-CM | POA: Diagnosis not present

## 2022-05-14 DIAGNOSIS — E782 Mixed hyperlipidemia: Secondary | ICD-10-CM | POA: Diagnosis not present

## 2022-05-14 DIAGNOSIS — E876 Hypokalemia: Secondary | ICD-10-CM | POA: Diagnosis not present

## 2022-05-15 ENCOUNTER — Ambulatory Visit (HOSPITAL_COMMUNITY): Payer: Medicare Other | Admitting: Nurse Practitioner

## 2022-05-16 DIAGNOSIS — E1165 Type 2 diabetes mellitus with hyperglycemia: Secondary | ICD-10-CM | POA: Diagnosis not present

## 2022-05-16 DIAGNOSIS — I10 Essential (primary) hypertension: Secondary | ICD-10-CM | POA: Diagnosis not present

## 2022-05-16 DIAGNOSIS — Z9181 History of falling: Secondary | ICD-10-CM | POA: Diagnosis not present

## 2022-05-16 DIAGNOSIS — I5032 Chronic diastolic (congestive) heart failure: Secondary | ICD-10-CM | POA: Diagnosis not present

## 2022-05-16 DIAGNOSIS — Z7984 Long term (current) use of oral hypoglycemic drugs: Secondary | ICD-10-CM | POA: Diagnosis not present

## 2022-05-16 DIAGNOSIS — Z86718 Personal history of other venous thrombosis and embolism: Secondary | ICD-10-CM | POA: Diagnosis not present

## 2022-05-16 DIAGNOSIS — I110 Hypertensive heart disease with heart failure: Secondary | ICD-10-CM | POA: Diagnosis not present

## 2022-05-16 DIAGNOSIS — Z7901 Long term (current) use of anticoagulants: Secondary | ICD-10-CM | POA: Diagnosis not present

## 2022-05-16 DIAGNOSIS — M810 Age-related osteoporosis without current pathological fracture: Secondary | ICD-10-CM | POA: Diagnosis not present

## 2022-05-16 DIAGNOSIS — U071 COVID-19: Secondary | ICD-10-CM | POA: Diagnosis not present

## 2022-05-16 DIAGNOSIS — K219 Gastro-esophageal reflux disease without esophagitis: Secondary | ICD-10-CM | POA: Diagnosis not present

## 2022-05-16 DIAGNOSIS — E876 Hypokalemia: Secondary | ICD-10-CM | POA: Diagnosis not present

## 2022-05-16 DIAGNOSIS — M179 Osteoarthritis of knee, unspecified: Secondary | ICD-10-CM | POA: Diagnosis not present

## 2022-05-16 DIAGNOSIS — E1169 Type 2 diabetes mellitus with other specified complication: Secondary | ICD-10-CM | POA: Diagnosis not present

## 2022-05-16 DIAGNOSIS — I480 Paroxysmal atrial fibrillation: Secondary | ICD-10-CM | POA: Diagnosis not present

## 2022-05-16 DIAGNOSIS — I4891 Unspecified atrial fibrillation: Secondary | ICD-10-CM | POA: Diagnosis not present

## 2022-05-16 DIAGNOSIS — G4733 Obstructive sleep apnea (adult) (pediatric): Secondary | ICD-10-CM | POA: Diagnosis not present

## 2022-05-16 DIAGNOSIS — Z86711 Personal history of pulmonary embolism: Secondary | ICD-10-CM | POA: Diagnosis not present

## 2022-05-16 DIAGNOSIS — E782 Mixed hyperlipidemia: Secondary | ICD-10-CM | POA: Diagnosis not present

## 2022-05-16 DIAGNOSIS — M109 Gout, unspecified: Secondary | ICD-10-CM | POA: Diagnosis not present

## 2022-05-17 DIAGNOSIS — G4733 Obstructive sleep apnea (adult) (pediatric): Secondary | ICD-10-CM | POA: Diagnosis not present

## 2022-05-17 DIAGNOSIS — Z9181 History of falling: Secondary | ICD-10-CM | POA: Diagnosis not present

## 2022-05-17 DIAGNOSIS — I480 Paroxysmal atrial fibrillation: Secondary | ICD-10-CM | POA: Diagnosis not present

## 2022-05-17 DIAGNOSIS — Z7901 Long term (current) use of anticoagulants: Secondary | ICD-10-CM | POA: Diagnosis not present

## 2022-05-17 DIAGNOSIS — I110 Hypertensive heart disease with heart failure: Secondary | ICD-10-CM | POA: Diagnosis not present

## 2022-05-17 DIAGNOSIS — Z86711 Personal history of pulmonary embolism: Secondary | ICD-10-CM | POA: Diagnosis not present

## 2022-05-17 DIAGNOSIS — U071 COVID-19: Secondary | ICD-10-CM | POA: Diagnosis not present

## 2022-05-17 DIAGNOSIS — K219 Gastro-esophageal reflux disease without esophagitis: Secondary | ICD-10-CM | POA: Diagnosis not present

## 2022-05-17 DIAGNOSIS — E876 Hypokalemia: Secondary | ICD-10-CM | POA: Diagnosis not present

## 2022-05-17 DIAGNOSIS — M109 Gout, unspecified: Secondary | ICD-10-CM | POA: Diagnosis not present

## 2022-05-17 DIAGNOSIS — Z86718 Personal history of other venous thrombosis and embolism: Secondary | ICD-10-CM | POA: Diagnosis not present

## 2022-05-17 DIAGNOSIS — I5032 Chronic diastolic (congestive) heart failure: Secondary | ICD-10-CM | POA: Diagnosis not present

## 2022-05-17 DIAGNOSIS — Z7984 Long term (current) use of oral hypoglycemic drugs: Secondary | ICD-10-CM | POA: Diagnosis not present

## 2022-05-17 DIAGNOSIS — E1169 Type 2 diabetes mellitus with other specified complication: Secondary | ICD-10-CM | POA: Diagnosis not present

## 2022-05-17 DIAGNOSIS — E782 Mixed hyperlipidemia: Secondary | ICD-10-CM | POA: Diagnosis not present

## 2022-05-20 DIAGNOSIS — Z9181 History of falling: Secondary | ICD-10-CM | POA: Diagnosis not present

## 2022-05-20 DIAGNOSIS — Z86711 Personal history of pulmonary embolism: Secondary | ICD-10-CM | POA: Diagnosis not present

## 2022-05-20 DIAGNOSIS — I480 Paroxysmal atrial fibrillation: Secondary | ICD-10-CM | POA: Diagnosis not present

## 2022-05-20 DIAGNOSIS — E1169 Type 2 diabetes mellitus with other specified complication: Secondary | ICD-10-CM | POA: Diagnosis not present

## 2022-05-20 DIAGNOSIS — E876 Hypokalemia: Secondary | ICD-10-CM | POA: Diagnosis not present

## 2022-05-20 DIAGNOSIS — K219 Gastro-esophageal reflux disease without esophagitis: Secondary | ICD-10-CM | POA: Diagnosis not present

## 2022-05-20 DIAGNOSIS — U071 COVID-19: Secondary | ICD-10-CM | POA: Diagnosis not present

## 2022-05-20 DIAGNOSIS — Z7984 Long term (current) use of oral hypoglycemic drugs: Secondary | ICD-10-CM | POA: Diagnosis not present

## 2022-05-20 DIAGNOSIS — I5032 Chronic diastolic (congestive) heart failure: Secondary | ICD-10-CM | POA: Diagnosis not present

## 2022-05-20 DIAGNOSIS — Z7901 Long term (current) use of anticoagulants: Secondary | ICD-10-CM | POA: Diagnosis not present

## 2022-05-20 DIAGNOSIS — G4733 Obstructive sleep apnea (adult) (pediatric): Secondary | ICD-10-CM | POA: Diagnosis not present

## 2022-05-20 DIAGNOSIS — M109 Gout, unspecified: Secondary | ICD-10-CM | POA: Diagnosis not present

## 2022-05-20 DIAGNOSIS — Z86718 Personal history of other venous thrombosis and embolism: Secondary | ICD-10-CM | POA: Diagnosis not present

## 2022-05-20 DIAGNOSIS — E782 Mixed hyperlipidemia: Secondary | ICD-10-CM | POA: Diagnosis not present

## 2022-05-20 DIAGNOSIS — I110 Hypertensive heart disease with heart failure: Secondary | ICD-10-CM | POA: Diagnosis not present

## 2022-05-24 DIAGNOSIS — Z7901 Long term (current) use of anticoagulants: Secondary | ICD-10-CM | POA: Diagnosis not present

## 2022-05-24 DIAGNOSIS — I5032 Chronic diastolic (congestive) heart failure: Secondary | ICD-10-CM | POA: Diagnosis not present

## 2022-05-24 DIAGNOSIS — E876 Hypokalemia: Secondary | ICD-10-CM | POA: Diagnosis not present

## 2022-05-24 DIAGNOSIS — M109 Gout, unspecified: Secondary | ICD-10-CM | POA: Diagnosis not present

## 2022-05-24 DIAGNOSIS — K219 Gastro-esophageal reflux disease without esophagitis: Secondary | ICD-10-CM | POA: Diagnosis not present

## 2022-05-24 DIAGNOSIS — E782 Mixed hyperlipidemia: Secondary | ICD-10-CM | POA: Diagnosis not present

## 2022-05-24 DIAGNOSIS — I110 Hypertensive heart disease with heart failure: Secondary | ICD-10-CM | POA: Diagnosis not present

## 2022-05-24 DIAGNOSIS — Z86718 Personal history of other venous thrombosis and embolism: Secondary | ICD-10-CM | POA: Diagnosis not present

## 2022-05-24 DIAGNOSIS — E1169 Type 2 diabetes mellitus with other specified complication: Secondary | ICD-10-CM | POA: Diagnosis not present

## 2022-05-24 DIAGNOSIS — I480 Paroxysmal atrial fibrillation: Secondary | ICD-10-CM | POA: Diagnosis not present

## 2022-05-24 DIAGNOSIS — Z9181 History of falling: Secondary | ICD-10-CM | POA: Diagnosis not present

## 2022-05-24 DIAGNOSIS — Z7984 Long term (current) use of oral hypoglycemic drugs: Secondary | ICD-10-CM | POA: Diagnosis not present

## 2022-05-24 DIAGNOSIS — U071 COVID-19: Secondary | ICD-10-CM | POA: Diagnosis not present

## 2022-05-24 DIAGNOSIS — G4733 Obstructive sleep apnea (adult) (pediatric): Secondary | ICD-10-CM | POA: Diagnosis not present

## 2022-05-24 DIAGNOSIS — Z86711 Personal history of pulmonary embolism: Secondary | ICD-10-CM | POA: Diagnosis not present

## 2022-05-27 DIAGNOSIS — M7662 Achilles tendinitis, left leg: Secondary | ICD-10-CM | POA: Diagnosis not present

## 2022-05-27 DIAGNOSIS — R600 Localized edema: Secondary | ICD-10-CM | POA: Diagnosis not present

## 2022-05-27 DIAGNOSIS — I872 Venous insufficiency (chronic) (peripheral): Secondary | ICD-10-CM | POA: Diagnosis not present

## 2022-05-27 DIAGNOSIS — G4733 Obstructive sleep apnea (adult) (pediatric): Secondary | ICD-10-CM | POA: Diagnosis not present

## 2022-05-27 DIAGNOSIS — I10 Essential (primary) hypertension: Secondary | ICD-10-CM | POA: Diagnosis not present

## 2022-05-27 DIAGNOSIS — Z86718 Personal history of other venous thrombosis and embolism: Secondary | ICD-10-CM | POA: Diagnosis not present

## 2022-05-27 DIAGNOSIS — D6869 Other thrombophilia: Secondary | ICD-10-CM | POA: Diagnosis not present

## 2022-05-27 DIAGNOSIS — I4891 Unspecified atrial fibrillation: Secondary | ICD-10-CM | POA: Diagnosis not present

## 2022-05-28 ENCOUNTER — Emergency Department (HOSPITAL_COMMUNITY)
Admission: EM | Admit: 2022-05-28 | Discharge: 2022-05-28 | Disposition: A | Discharge status: Home / Self Care | Payer: Medicare Other | Attending: Emergency Medicine | Admitting: Emergency Medicine

## 2022-05-28 ENCOUNTER — Emergency Department (HOSPITAL_COMMUNITY): Payer: Medicare Other

## 2022-05-28 ENCOUNTER — Encounter (HOSPITAL_COMMUNITY): Payer: Self-pay

## 2022-05-28 ENCOUNTER — Other Ambulatory Visit: Payer: Self-pay

## 2022-05-28 DIAGNOSIS — I509 Heart failure, unspecified: Secondary | ICD-10-CM | POA: Insufficient documentation

## 2022-05-28 DIAGNOSIS — E860 Dehydration: Secondary | ICD-10-CM | POA: Diagnosis not present

## 2022-05-28 DIAGNOSIS — Z79899 Other long term (current) drug therapy: Secondary | ICD-10-CM | POA: Diagnosis not present

## 2022-05-28 DIAGNOSIS — Z7984 Long term (current) use of oral hypoglycemic drugs: Secondary | ICD-10-CM | POA: Insufficient documentation

## 2022-05-28 DIAGNOSIS — S5002XA Contusion of left elbow, initial encounter: Secondary | ICD-10-CM | POA: Diagnosis not present

## 2022-05-28 DIAGNOSIS — J9811 Atelectasis: Secondary | ICD-10-CM | POA: Diagnosis not present

## 2022-05-28 DIAGNOSIS — R6889 Other general symptoms and signs: Secondary | ICD-10-CM | POA: Diagnosis not present

## 2022-05-28 DIAGNOSIS — Z7901 Long term (current) use of anticoagulants: Secondary | ICD-10-CM | POA: Diagnosis not present

## 2022-05-28 DIAGNOSIS — Z743 Need for continuous supervision: Secondary | ICD-10-CM | POA: Diagnosis not present

## 2022-05-28 DIAGNOSIS — S59902A Unspecified injury of left elbow, initial encounter: Secondary | ICD-10-CM | POA: Diagnosis present

## 2022-05-28 DIAGNOSIS — R52 Pain, unspecified: Secondary | ICD-10-CM | POA: Diagnosis not present

## 2022-05-28 DIAGNOSIS — R0902 Hypoxemia: Secondary | ICD-10-CM | POA: Diagnosis not present

## 2022-05-28 DIAGNOSIS — R0602 Shortness of breath: Secondary | ICD-10-CM | POA: Diagnosis not present

## 2022-05-28 DIAGNOSIS — M25522 Pain in left elbow: Secondary | ICD-10-CM | POA: Diagnosis not present

## 2022-05-28 DIAGNOSIS — W19XXXA Unspecified fall, initial encounter: Secondary | ICD-10-CM

## 2022-05-28 DIAGNOSIS — W1839XA Other fall on same level, initial encounter: Secondary | ICD-10-CM | POA: Diagnosis not present

## 2022-05-28 DIAGNOSIS — I10 Essential (primary) hypertension: Secondary | ICD-10-CM | POA: Diagnosis not present

## 2022-05-28 LAB — CBC WITH DIFFERENTIAL/PLATELET
Abs Immature Granulocytes: 0.28 10*3/uL — ABNORMAL HIGH (ref 0.00–0.07)
Basophils Absolute: 0 10*3/uL (ref 0.0–0.1)
Basophils Relative: 0 %
Eosinophils Absolute: 0.1 10*3/uL (ref 0.0–0.5)
Eosinophils Relative: 2 %
HCT: 31.9 % — ABNORMAL LOW (ref 36.0–46.0)
Hemoglobin: 10 g/dL — ABNORMAL LOW (ref 12.0–15.0)
Immature Granulocytes: 3 %
Lymphocytes Relative: 14 %
Lymphs Abs: 1.1 10*3/uL (ref 0.7–4.0)
MCH: 28.5 pg (ref 26.0–34.0)
MCHC: 31.3 g/dL (ref 30.0–36.0)
MCV: 90.9 fL (ref 80.0–100.0)
Monocytes Absolute: 0.9 10*3/uL (ref 0.1–1.0)
Monocytes Relative: 11 %
Neutro Abs: 5.9 10*3/uL (ref 1.7–7.7)
Neutrophils Relative %: 70 %
Platelets: 265 10*3/uL (ref 150–400)
RBC: 3.51 MIL/uL — ABNORMAL LOW (ref 3.87–5.11)
RDW: 18 % — ABNORMAL HIGH (ref 11.5–15.5)
WBC: 8.4 10*3/uL (ref 4.0–10.5)
nRBC: 0 % (ref 0.0–0.2)

## 2022-05-28 LAB — COMPREHENSIVE METABOLIC PANEL
ALT: 13 U/L (ref 0–44)
AST: 21 U/L (ref 15–41)
Albumin: 3 g/dL — ABNORMAL LOW (ref 3.5–5.0)
Alkaline Phosphatase: 68 U/L (ref 38–126)
Anion gap: 9 (ref 5–15)
BUN: 39 mg/dL — ABNORMAL HIGH (ref 8–23)
CO2: 24 mmol/L (ref 22–32)
Calcium: 8.9 mg/dL (ref 8.9–10.3)
Chloride: 103 mmol/L (ref 98–111)
Creatinine, Ser: 0.89 mg/dL (ref 0.44–1.00)
GFR, Estimated: >60 mL/min (ref >60)
Glucose, Bld: 126 mg/dL — ABNORMAL HIGH (ref 70–99)
Potassium: 4.6 mmol/L (ref 3.5–5.1)
Sodium: 136 mmol/L (ref 135–145)
Total Bilirubin: 0.8 mg/dL (ref 0.3–1.2)
Total Protein: 7.1 g/dL (ref 6.5–8.1)

## 2022-05-28 LAB — PROTIME-INR
INR: 5.4 (ref 0.8–1.2)
Prothrombin Time: 48.5 seconds — ABNORMAL HIGH (ref 11.4–15.2)

## 2022-05-28 MED ORDER — SODIUM CHLORIDE 0.9 % IV BOLUS
250.0000 mL | Freq: Once | INTRAVENOUS | Status: AC
  Administered 2022-05-28: 250 mL via INTRAVENOUS

## 2022-05-28 MED ORDER — HYDROCODONE-ACETAMINOPHEN 5-325 MG PO TABS
1.0000 | ORAL_TABLET | Freq: Once | ORAL | Status: AC
  Administered 2022-05-28: 1 via ORAL
  Filled 2022-05-28: qty 1

## 2022-05-28 NOTE — ED Notes (Signed)
Pt took her home dofetalide 259m

## 2022-05-28 NOTE — Discharge Instructions (Signed)
Follow up with your cardiologist

## 2022-05-28 NOTE — ED Triage Notes (Signed)
Pt arrives via [facility] for a fall from standing. Pt c/o L elbow pain. Denies head injury/LOC. Pt is anticoagulated on Coumadin. Pt also c/o DOE, orthopnea, and worsening swelling in bilateral feet with serous drainage. During triage pt's distal legs are also noted to be reddened.

## 2022-05-28 NOTE — ED Provider Notes (Signed)
[facility] Provider Note   CSN: [id number] Arrival date & time: 05/28/22  1647     History {Add pertinent medical, surgical, social history, OB history to HPI:1} Chief Complaint  Patient presents with   Fall   Shortness of Breath    Natasha Glass is a 81 y.o. female.  Patient has a history of congestive heart failure.  He felt weak and fell to the ground hit her left elbow.  No loss of consciousness   Fall Associated symptoms include shortness of breath.  Shortness of Breath      Home Medications Prior to Admission medications   Medication Sig Start Date End Date Taking? Authorizing Provider  acetaminophen (TYLENOL) 500 MG tablet Take 500 mg by mouth at bedtime.   Yes [provider]  allopurinol (ZYLOPRIM) 300 MG tablet Take 0.5 tablets (150 mg total) by mouth every evening. 09/14/21  Yes Gerlene Fee, NP  ascorbic acid (VITAMIN C) 500 MG tablet Take 1 tablet (500 mg total) by mouth daily. 05/06/22 06/05/22 Yes Alma Friendly, MD  celecoxib (CELEBREX) 100 MG capsule 1 capsule 05/27/22  Yes [provider]  Cholecalciferol (VITAMIN D3) 25 MCG (1000 UT) CAPS Take 4,000 Units by mouth daily in the afternoon.   Yes [provider]  clobetasol cream (TEMOVATE) 7.67 % Apply 1 application topically as needed. 09/14/21  Yes Gerlene Fee, NP  famotidine (PEPCID) 20 MG tablet Take 20 mg by mouth 2 (two) times daily. 05/28/22  Yes [provider]  furosemide (LASIX) 40 MG tablet Take one tablet by mouth daily, every other day add extra 1/2 tablet daily Patient taking differently: Take 80 mg by mouth daily. 11/13/21  Yes Sherran Needs, NP  HYDROcodone-acetaminophen (NORCO/VICODIN) 5-325 MG tablet Take 1 tablet by mouth every 6 (six) hours as needed for moderate pain. 05/27/22  Yes [provider]  losartan (COZAAR) 25 MG tablet Take 1 tablet (25 mg total) by mouth daily. 09/14/21  Yes Gerlene Fee, NP   magnesium oxide (MAG-OX) 400 MG tablet Take 1 tablet (400 mg total) by mouth at bedtime. 09/12/21  Yes Gerlene Fee, NP  metFORMIN (GLUCOPHAGE) 500 MG tablet Take 1 tablet (500 mg total) by mouth 2 (two) times daily with a meal. 09/14/21  Yes Gerlene Fee, NP  metoprolol tartrate (LOPRESSOR) 25 MG tablet Take 2 tablets (50 mg total) by mouth 2 (two) times daily. 05/05/22  Yes Alma Friendly, MD  Multiple Vitamins-Minerals (PRESERVISION AREDS 2) CAPS Take 2 capsules by mouth daily.   Yes [provider]  Naphazoline-Hydroxyprop Mcell 0.03-0.5 % SOLN Place 1 drop into both eyes 2 (two) times daily.   Yes [provider]  potassium chloride SA (KLOR-CON M) 20 MEQ tablet Take 1.5 tablets (30 mEq total) by mouth daily. Patient taking differently: Take 40 mEq by mouth daily. 09/14/21  Yes Gerlene Fee, NP  rosuvastatin (CRESTOR) 5 MG tablet Take 5 mg by mouth daily. 03/26/22  Yes [provider]  vitamin B-12 (CYANOCOBALAMIN) 500 MCG tablet Take 500 mcg by mouth daily.   Yes [provider]  Vitamin K, Phytonadione, 100 MCG TABS Take 10 tablets by mouth daily. For 3 days start on 05/28/22 05/27/22  Yes [provider]  Dextromethorphan-guaiFENesin (MUCINEX DM) 30-600 MG TB12 1 tablet    [provider]  Lancet [employer] ([place] LANCING) [place]  07/19/21   [provider]  Lancets (ONETOUCH DELICA PLUS [id number]) [place]  Apply 1 each topically 3 (three) times daily. 07/19/21   [provider]  [facility] ([facility]) ULTRA test strip  10/03/21   [provider]  warfarin (COUMADIN) 5 MG tablet Take 1 tablet (5 mg total) by mouth daily. 5 mg daily except for Monday take 1/2 tablet 09/14/21   Gerlene Fee, NP      Allergies    Bactrim [sulfamethoxazole-trimethoprim], Morphine and related, Oysters [shellfish allergy], Amlodipine, Percocet [oxycodone-acetaminophen], Tdap [tetanus-diphth-acell pertussis], Tetanus toxoids,  Tramadol, Carbamazepine, Lactose intolerance (gi), Macrodantin [nitrofurantoin], Other, Tetanus-diphtheria toxoids td, Topiramate, Diovan [valsartan], Diphtheria toxoid, and Macrodantin [nitrofurantoin macrocrystal]    Review of Systems   Review of Systems  Respiratory:  Positive for shortness of breath.     Physical Exam Updated Vital Signs BP 108/68   Pulse 81   Temp 98.1 F (36.7 C)   Resp 16   Ht 5' 3"  (1.6 m)   Wt 127 kg   SpO2 98%   BMI 49.60 kg/m  Physical Exam  ED Results / Procedures / Treatments   Labs (all labs ordered are listed, but only abnormal results are displayed) Labs Reviewed  CBC WITH DIFFERENTIAL/PLATELET - Abnormal; Notable for the following components:      Result Value   RBC 3.51 (*)    Hemoglobin 10.0 (*)    HCT 31.9 (*)    RDW 18.0 (*)    Abs Immature Granulocytes 0.28 (*)    All other components within normal limits  COMPREHENSIVE METABOLIC PANEL - Abnormal; Notable for the following components:   Glucose, Bld 126 (*)    BUN 39 (*)    Albumin 3.0 (*)    All other components within normal limits  PROTIME-INR - Abnormal; Notable for the following components:   Prothrombin Time 48.5 (*)    INR 5.4 (*)    All other components within normal limits    EKG None  Radiology DG Elbow Complete Left  Result Date: 05/28/2022 CLINICAL DATA:  Left elbow pain after fall. EXAM: LEFT ELBOW - COMPLETE 3+ VIEW COMPARISON:  None Available. FINDINGS: There is no evidence of fracture, dislocation, or joint effusion. There is no evidence of arthropathy or other focal bone abnormality. Soft tissues are unremarkable. IMPRESSION: Negative. Electronically Signed   By: Marijo Conception M.D.   On: 05/28/2022 19:25   DG Chest Port 1 View  Result Date: 05/28/2022 CLINICAL DATA:  Fall. EXAM: PORTABLE CHEST 1 VIEW COMPARISON:  April 30, 2022. FINDINGS: Stable cardiomediastinal silhouette. Right lung is clear. Minimal left basilar subsegmental atelectasis is noted. Bony  thorax is unremarkable. IMPRESSION: Minimal left basilar subsegmental atelectasis. Electronically Signed   By: Marijo Conception M.D.   On: 05/28/2022 19:24    Procedures Procedures  {Document cardiac monitor, telemetry assessment procedure when appropriate:1}  Medications Ordered in ED Medications  sodium chloride 0.9 % bolus 250 mL (has no administration in time range)  HYDROcodone-acetaminophen (NORCO/VICODIN) 5-325 MG per tablet 1 tablet (1 tablet Oral Given 05/28/22 2200)    ED Course/ Medical Decision Making/ A&P                           Medical Decision Making Amount and/or Complexity of Data Reviewed Labs: ordered. Radiology: ordered. ECG/medicine tests: ordered.  Risk Prescription drug management.   Patient with contusion left elbow and mild dehydration.  She will follow-up with her cardiologist  {Document critical care time when appropriate:1} {Document review of labs and clinical decision  tools ie heart score, Chads2Vasc2 etc:1}  {Document your independent review of radiology images, and any outside records:1} {Document your discussion with family members, caretakers, and with consultants:1} {Document social determinants of health affecting pt's care:1} {Document your decision making why or why not admission, treatments were needed:1} Final Clinical Impression(s) / ED Diagnoses Final diagnoses:  Fall, initial encounter    Rx / DC Orders ED Discharge Orders     None

## 2022-05-28 NOTE — ED Notes (Signed)
Pt care taken, complaining of swelling in feet and pain in elbow.

## 2022-05-30 DIAGNOSIS — E876 Hypokalemia: Secondary | ICD-10-CM | POA: Diagnosis not present

## 2022-05-30 DIAGNOSIS — M109 Gout, unspecified: Secondary | ICD-10-CM | POA: Diagnosis not present

## 2022-05-30 DIAGNOSIS — Z9181 History of falling: Secondary | ICD-10-CM | POA: Diagnosis not present

## 2022-05-30 DIAGNOSIS — Z86711 Personal history of pulmonary embolism: Secondary | ICD-10-CM | POA: Diagnosis not present

## 2022-05-30 DIAGNOSIS — E1169 Type 2 diabetes mellitus with other specified complication: Secondary | ICD-10-CM | POA: Diagnosis not present

## 2022-05-30 DIAGNOSIS — Z7901 Long term (current) use of anticoagulants: Secondary | ICD-10-CM | POA: Diagnosis not present

## 2022-05-30 DIAGNOSIS — Z7984 Long term (current) use of oral hypoglycemic drugs: Secondary | ICD-10-CM | POA: Diagnosis not present

## 2022-05-30 DIAGNOSIS — K219 Gastro-esophageal reflux disease without esophagitis: Secondary | ICD-10-CM | POA: Diagnosis not present

## 2022-05-30 DIAGNOSIS — E782 Mixed hyperlipidemia: Secondary | ICD-10-CM | POA: Diagnosis not present

## 2022-05-30 DIAGNOSIS — G4733 Obstructive sleep apnea (adult) (pediatric): Secondary | ICD-10-CM | POA: Diagnosis not present

## 2022-05-30 DIAGNOSIS — U071 COVID-19: Secondary | ICD-10-CM | POA: Diagnosis not present

## 2022-05-30 DIAGNOSIS — Z86718 Personal history of other venous thrombosis and embolism: Secondary | ICD-10-CM | POA: Diagnosis not present

## 2022-05-30 DIAGNOSIS — I5032 Chronic diastolic (congestive) heart failure: Secondary | ICD-10-CM | POA: Diagnosis not present

## 2022-05-30 DIAGNOSIS — I480 Paroxysmal atrial fibrillation: Secondary | ICD-10-CM | POA: Diagnosis not present

## 2022-05-30 DIAGNOSIS — I110 Hypertensive heart disease with heart failure: Secondary | ICD-10-CM | POA: Diagnosis not present

## 2022-06-01 DIAGNOSIS — I4891 Unspecified atrial fibrillation: Secondary | ICD-10-CM | POA: Diagnosis not present

## 2022-06-03 DIAGNOSIS — M109 Gout, unspecified: Secondary | ICD-10-CM | POA: Diagnosis not present

## 2022-06-03 DIAGNOSIS — Z86718 Personal history of other venous thrombosis and embolism: Secondary | ICD-10-CM | POA: Diagnosis not present

## 2022-06-03 DIAGNOSIS — Z7901 Long term (current) use of anticoagulants: Secondary | ICD-10-CM | POA: Diagnosis not present

## 2022-06-03 DIAGNOSIS — E782 Mixed hyperlipidemia: Secondary | ICD-10-CM | POA: Diagnosis not present

## 2022-06-03 DIAGNOSIS — Z86711 Personal history of pulmonary embolism: Secondary | ICD-10-CM | POA: Diagnosis not present

## 2022-06-03 DIAGNOSIS — I5032 Chronic diastolic (congestive) heart failure: Secondary | ICD-10-CM | POA: Diagnosis not present

## 2022-06-03 DIAGNOSIS — I110 Hypertensive heart disease with heart failure: Secondary | ICD-10-CM | POA: Diagnosis not present

## 2022-06-03 DIAGNOSIS — Z7984 Long term (current) use of oral hypoglycemic drugs: Secondary | ICD-10-CM | POA: Diagnosis not present

## 2022-06-03 DIAGNOSIS — E876 Hypokalemia: Secondary | ICD-10-CM | POA: Diagnosis not present

## 2022-06-03 DIAGNOSIS — K219 Gastro-esophageal reflux disease without esophagitis: Secondary | ICD-10-CM | POA: Diagnosis not present

## 2022-06-03 DIAGNOSIS — U071 COVID-19: Secondary | ICD-10-CM | POA: Diagnosis not present

## 2022-06-03 DIAGNOSIS — E1169 Type 2 diabetes mellitus with other specified complication: Secondary | ICD-10-CM | POA: Diagnosis not present

## 2022-06-03 DIAGNOSIS — I480 Paroxysmal atrial fibrillation: Secondary | ICD-10-CM | POA: Diagnosis not present

## 2022-06-03 DIAGNOSIS — G4733 Obstructive sleep apnea (adult) (pediatric): Secondary | ICD-10-CM | POA: Diagnosis not present

## 2022-06-03 DIAGNOSIS — Z9181 History of falling: Secondary | ICD-10-CM | POA: Diagnosis not present

## 2022-06-05 DIAGNOSIS — I110 Hypertensive heart disease with heart failure: Secondary | ICD-10-CM | POA: Diagnosis not present

## 2022-06-05 DIAGNOSIS — Z9181 History of falling: Secondary | ICD-10-CM | POA: Diagnosis not present

## 2022-06-05 DIAGNOSIS — I5032 Chronic diastolic (congestive) heart failure: Secondary | ICD-10-CM | POA: Diagnosis not present

## 2022-06-05 DIAGNOSIS — E1169 Type 2 diabetes mellitus with other specified complication: Secondary | ICD-10-CM | POA: Diagnosis not present

## 2022-06-05 DIAGNOSIS — Z86711 Personal history of pulmonary embolism: Secondary | ICD-10-CM | POA: Diagnosis not present

## 2022-06-05 DIAGNOSIS — M109 Gout, unspecified: Secondary | ICD-10-CM | POA: Diagnosis not present

## 2022-06-05 DIAGNOSIS — U071 COVID-19: Secondary | ICD-10-CM | POA: Diagnosis not present

## 2022-06-05 DIAGNOSIS — E876 Hypokalemia: Secondary | ICD-10-CM | POA: Diagnosis not present

## 2022-06-05 DIAGNOSIS — Z7901 Long term (current) use of anticoagulants: Secondary | ICD-10-CM | POA: Diagnosis not present

## 2022-06-05 DIAGNOSIS — G4733 Obstructive sleep apnea (adult) (pediatric): Secondary | ICD-10-CM | POA: Diagnosis not present

## 2022-06-05 DIAGNOSIS — E782 Mixed hyperlipidemia: Secondary | ICD-10-CM | POA: Diagnosis not present

## 2022-06-05 DIAGNOSIS — Z86718 Personal history of other venous thrombosis and embolism: Secondary | ICD-10-CM | POA: Diagnosis not present

## 2022-06-05 DIAGNOSIS — Z7984 Long term (current) use of oral hypoglycemic drugs: Secondary | ICD-10-CM | POA: Diagnosis not present

## 2022-06-05 DIAGNOSIS — K219 Gastro-esophageal reflux disease without esophagitis: Secondary | ICD-10-CM | POA: Diagnosis not present

## 2022-06-05 DIAGNOSIS — I480 Paroxysmal atrial fibrillation: Secondary | ICD-10-CM | POA: Diagnosis not present

## 2022-06-06 DIAGNOSIS — R269 Unspecified abnormalities of gait and mobility: Secondary | ICD-10-CM | POA: Diagnosis not present

## 2022-06-07 ENCOUNTER — Other Ambulatory Visit (HOSPITAL_COMMUNITY): Payer: Self-pay | Admitting: *Deleted

## 2022-06-07 ENCOUNTER — Ambulatory Visit: Payer: Medicare Other | Admitting: Nurse Practitioner

## 2022-06-07 DIAGNOSIS — I480 Paroxysmal atrial fibrillation: Secondary | ICD-10-CM | POA: Diagnosis not present

## 2022-06-07 DIAGNOSIS — E876 Hypokalemia: Secondary | ICD-10-CM | POA: Diagnosis not present

## 2022-06-07 DIAGNOSIS — Z7901 Long term (current) use of anticoagulants: Secondary | ICD-10-CM | POA: Diagnosis not present

## 2022-06-07 DIAGNOSIS — I110 Hypertensive heart disease with heart failure: Secondary | ICD-10-CM | POA: Diagnosis not present

## 2022-06-07 DIAGNOSIS — G4733 Obstructive sleep apnea (adult) (pediatric): Secondary | ICD-10-CM | POA: Diagnosis not present

## 2022-06-07 DIAGNOSIS — Z9181 History of falling: Secondary | ICD-10-CM | POA: Diagnosis not present

## 2022-06-07 DIAGNOSIS — E782 Mixed hyperlipidemia: Secondary | ICD-10-CM | POA: Diagnosis not present

## 2022-06-07 DIAGNOSIS — Z86718 Personal history of other venous thrombosis and embolism: Secondary | ICD-10-CM | POA: Diagnosis not present

## 2022-06-07 DIAGNOSIS — Z7984 Long term (current) use of oral hypoglycemic drugs: Secondary | ICD-10-CM | POA: Diagnosis not present

## 2022-06-07 DIAGNOSIS — U071 COVID-19: Secondary | ICD-10-CM | POA: Diagnosis not present

## 2022-06-07 DIAGNOSIS — M109 Gout, unspecified: Secondary | ICD-10-CM | POA: Diagnosis not present

## 2022-06-07 DIAGNOSIS — I5032 Chronic diastolic (congestive) heart failure: Secondary | ICD-10-CM | POA: Diagnosis not present

## 2022-06-07 DIAGNOSIS — K219 Gastro-esophageal reflux disease without esophagitis: Secondary | ICD-10-CM | POA: Diagnosis not present

## 2022-06-07 DIAGNOSIS — Z86711 Personal history of pulmonary embolism: Secondary | ICD-10-CM | POA: Diagnosis not present

## 2022-06-07 DIAGNOSIS — E1169 Type 2 diabetes mellitus with other specified complication: Secondary | ICD-10-CM | POA: Diagnosis not present

## 2022-06-07 MED ORDER — DOFETILIDE 250 MCG PO CAPS: 250.0000 ug | ORAL_CAPSULE | Freq: Two times a day (BID) | ORAL | 2 refills | Status: DC

## 2022-06-10 DIAGNOSIS — Z86711 Personal history of pulmonary embolism: Secondary | ICD-10-CM | POA: Diagnosis not present

## 2022-06-10 DIAGNOSIS — Z86718 Personal history of other venous thrombosis and embolism: Secondary | ICD-10-CM | POA: Diagnosis not present

## 2022-06-10 DIAGNOSIS — E876 Hypokalemia: Secondary | ICD-10-CM | POA: Diagnosis not present

## 2022-06-10 DIAGNOSIS — Z7901 Long term (current) use of anticoagulants: Secondary | ICD-10-CM | POA: Diagnosis not present

## 2022-06-10 DIAGNOSIS — U071 COVID-19: Secondary | ICD-10-CM | POA: Diagnosis not present

## 2022-06-10 DIAGNOSIS — Z9181 History of falling: Secondary | ICD-10-CM | POA: Diagnosis not present

## 2022-06-10 DIAGNOSIS — I480 Paroxysmal atrial fibrillation: Secondary | ICD-10-CM | POA: Diagnosis not present

## 2022-06-10 DIAGNOSIS — M109 Gout, unspecified: Secondary | ICD-10-CM | POA: Diagnosis not present

## 2022-06-10 DIAGNOSIS — E782 Mixed hyperlipidemia: Secondary | ICD-10-CM | POA: Diagnosis not present

## 2022-06-10 DIAGNOSIS — E1169 Type 2 diabetes mellitus with other specified complication: Secondary | ICD-10-CM | POA: Diagnosis not present

## 2022-06-10 DIAGNOSIS — Z7984 Long term (current) use of oral hypoglycemic drugs: Secondary | ICD-10-CM | POA: Diagnosis not present

## 2022-06-10 DIAGNOSIS — G4733 Obstructive sleep apnea (adult) (pediatric): Secondary | ICD-10-CM | POA: Diagnosis not present

## 2022-06-10 DIAGNOSIS — I5032 Chronic diastolic (congestive) heart failure: Secondary | ICD-10-CM | POA: Diagnosis not present

## 2022-06-10 DIAGNOSIS — I110 Hypertensive heart disease with heart failure: Secondary | ICD-10-CM | POA: Diagnosis not present

## 2022-06-10 DIAGNOSIS — K219 Gastro-esophageal reflux disease without esophagitis: Secondary | ICD-10-CM | POA: Diagnosis not present

## 2022-06-11 DIAGNOSIS — Z7901 Long term (current) use of anticoagulants: Secondary | ICD-10-CM | POA: Diagnosis not present

## 2022-06-11 DIAGNOSIS — Z7984 Long term (current) use of oral hypoglycemic drugs: Secondary | ICD-10-CM | POA: Diagnosis not present

## 2022-06-11 DIAGNOSIS — Z9181 History of falling: Secondary | ICD-10-CM | POA: Diagnosis not present

## 2022-06-11 DIAGNOSIS — Z86711 Personal history of pulmonary embolism: Secondary | ICD-10-CM | POA: Diagnosis not present

## 2022-06-11 DIAGNOSIS — E782 Mixed hyperlipidemia: Secondary | ICD-10-CM | POA: Diagnosis not present

## 2022-06-11 DIAGNOSIS — K219 Gastro-esophageal reflux disease without esophagitis: Secondary | ICD-10-CM | POA: Diagnosis not present

## 2022-06-11 DIAGNOSIS — Z86718 Personal history of other venous thrombosis and embolism: Secondary | ICD-10-CM | POA: Diagnosis not present

## 2022-06-11 DIAGNOSIS — U071 COVID-19: Secondary | ICD-10-CM | POA: Diagnosis not present

## 2022-06-11 DIAGNOSIS — G4733 Obstructive sleep apnea (adult) (pediatric): Secondary | ICD-10-CM | POA: Diagnosis not present

## 2022-06-11 DIAGNOSIS — I480 Paroxysmal atrial fibrillation: Secondary | ICD-10-CM | POA: Diagnosis not present

## 2022-06-11 DIAGNOSIS — I5032 Chronic diastolic (congestive) heart failure: Secondary | ICD-10-CM | POA: Diagnosis not present

## 2022-06-11 DIAGNOSIS — E1169 Type 2 diabetes mellitus with other specified complication: Secondary | ICD-10-CM | POA: Diagnosis not present

## 2022-06-11 DIAGNOSIS — E876 Hypokalemia: Secondary | ICD-10-CM | POA: Diagnosis not present

## 2022-06-11 DIAGNOSIS — I110 Hypertensive heart disease with heart failure: Secondary | ICD-10-CM | POA: Diagnosis not present

## 2022-06-11 DIAGNOSIS — M109 Gout, unspecified: Secondary | ICD-10-CM | POA: Diagnosis not present

## 2022-06-11 DIAGNOSIS — R269 Unspecified abnormalities of gait and mobility: Secondary | ICD-10-CM | POA: Diagnosis not present

## 2022-06-13 DIAGNOSIS — E876 Hypokalemia: Secondary | ICD-10-CM | POA: Diagnosis not present

## 2022-06-13 DIAGNOSIS — Z7984 Long term (current) use of oral hypoglycemic drugs: Secondary | ICD-10-CM | POA: Diagnosis not present

## 2022-06-13 DIAGNOSIS — G4733 Obstructive sleep apnea (adult) (pediatric): Secondary | ICD-10-CM | POA: Diagnosis not present

## 2022-06-13 DIAGNOSIS — E1169 Type 2 diabetes mellitus with other specified complication: Secondary | ICD-10-CM | POA: Diagnosis not present

## 2022-06-13 DIAGNOSIS — Z86711 Personal history of pulmonary embolism: Secondary | ICD-10-CM | POA: Diagnosis not present

## 2022-06-13 DIAGNOSIS — I110 Hypertensive heart disease with heart failure: Secondary | ICD-10-CM | POA: Diagnosis not present

## 2022-06-13 DIAGNOSIS — Z9181 History of falling: Secondary | ICD-10-CM | POA: Diagnosis not present

## 2022-06-13 DIAGNOSIS — E782 Mixed hyperlipidemia: Secondary | ICD-10-CM | POA: Diagnosis not present

## 2022-06-13 DIAGNOSIS — K219 Gastro-esophageal reflux disease without esophagitis: Secondary | ICD-10-CM | POA: Diagnosis not present

## 2022-06-13 DIAGNOSIS — I5032 Chronic diastolic (congestive) heart failure: Secondary | ICD-10-CM | POA: Diagnosis not present

## 2022-06-13 DIAGNOSIS — Z7901 Long term (current) use of anticoagulants: Secondary | ICD-10-CM | POA: Diagnosis not present

## 2022-06-13 DIAGNOSIS — M109 Gout, unspecified: Secondary | ICD-10-CM | POA: Diagnosis not present

## 2022-06-13 DIAGNOSIS — I480 Paroxysmal atrial fibrillation: Secondary | ICD-10-CM | POA: Diagnosis not present

## 2022-06-13 DIAGNOSIS — U071 COVID-19: Secondary | ICD-10-CM | POA: Diagnosis not present

## 2022-06-13 DIAGNOSIS — Z86718 Personal history of other venous thrombosis and embolism: Secondary | ICD-10-CM | POA: Diagnosis not present

## 2022-06-14 DIAGNOSIS — Z9181 History of falling: Secondary | ICD-10-CM | POA: Diagnosis not present

## 2022-06-14 DIAGNOSIS — E876 Hypokalemia: Secondary | ICD-10-CM | POA: Diagnosis not present

## 2022-06-14 DIAGNOSIS — E1169 Type 2 diabetes mellitus with other specified complication: Secondary | ICD-10-CM | POA: Diagnosis not present

## 2022-06-14 DIAGNOSIS — M109 Gout, unspecified: Secondary | ICD-10-CM | POA: Diagnosis not present

## 2022-06-14 DIAGNOSIS — E782 Mixed hyperlipidemia: Secondary | ICD-10-CM | POA: Diagnosis not present

## 2022-06-14 DIAGNOSIS — K219 Gastro-esophageal reflux disease without esophagitis: Secondary | ICD-10-CM | POA: Diagnosis not present

## 2022-06-14 DIAGNOSIS — U071 COVID-19: Secondary | ICD-10-CM | POA: Diagnosis not present

## 2022-06-14 DIAGNOSIS — I5032 Chronic diastolic (congestive) heart failure: Secondary | ICD-10-CM | POA: Diagnosis not present

## 2022-06-14 DIAGNOSIS — I110 Hypertensive heart disease with heart failure: Secondary | ICD-10-CM | POA: Diagnosis not present

## 2022-06-14 DIAGNOSIS — Z7984 Long term (current) use of oral hypoglycemic drugs: Secondary | ICD-10-CM | POA: Diagnosis not present

## 2022-06-14 DIAGNOSIS — Z86711 Personal history of pulmonary embolism: Secondary | ICD-10-CM | POA: Diagnosis not present

## 2022-06-14 DIAGNOSIS — I480 Paroxysmal atrial fibrillation: Secondary | ICD-10-CM | POA: Diagnosis not present

## 2022-06-14 DIAGNOSIS — Z86718 Personal history of other venous thrombosis and embolism: Secondary | ICD-10-CM | POA: Diagnosis not present

## 2022-06-14 DIAGNOSIS — G4733 Obstructive sleep apnea (adult) (pediatric): Secondary | ICD-10-CM | POA: Diagnosis not present

## 2022-06-14 DIAGNOSIS — Z7901 Long term (current) use of anticoagulants: Secondary | ICD-10-CM | POA: Diagnosis not present

## 2022-06-18 DIAGNOSIS — Z9181 History of falling: Secondary | ICD-10-CM | POA: Diagnosis not present

## 2022-06-18 DIAGNOSIS — E782 Mixed hyperlipidemia: Secondary | ICD-10-CM | POA: Diagnosis not present

## 2022-06-18 DIAGNOSIS — U071 COVID-19: Secondary | ICD-10-CM | POA: Diagnosis not present

## 2022-06-18 DIAGNOSIS — Z86718 Personal history of other venous thrombosis and embolism: Secondary | ICD-10-CM | POA: Diagnosis not present

## 2022-06-18 DIAGNOSIS — E876 Hypokalemia: Secondary | ICD-10-CM | POA: Diagnosis not present

## 2022-06-18 DIAGNOSIS — I480 Paroxysmal atrial fibrillation: Secondary | ICD-10-CM | POA: Diagnosis not present

## 2022-06-18 DIAGNOSIS — I5032 Chronic diastolic (congestive) heart failure: Secondary | ICD-10-CM | POA: Diagnosis not present

## 2022-06-18 DIAGNOSIS — E1169 Type 2 diabetes mellitus with other specified complication: Secondary | ICD-10-CM | POA: Diagnosis not present

## 2022-06-18 DIAGNOSIS — I110 Hypertensive heart disease with heart failure: Secondary | ICD-10-CM | POA: Diagnosis not present

## 2022-06-18 DIAGNOSIS — Z86711 Personal history of pulmonary embolism: Secondary | ICD-10-CM | POA: Diagnosis not present

## 2022-06-18 DIAGNOSIS — M109 Gout, unspecified: Secondary | ICD-10-CM | POA: Diagnosis not present

## 2022-06-18 DIAGNOSIS — K219 Gastro-esophageal reflux disease without esophagitis: Secondary | ICD-10-CM | POA: Diagnosis not present

## 2022-06-18 DIAGNOSIS — G4733 Obstructive sleep apnea (adult) (pediatric): Secondary | ICD-10-CM | POA: Diagnosis not present

## 2022-06-18 DIAGNOSIS — Z7901 Long term (current) use of anticoagulants: Secondary | ICD-10-CM | POA: Diagnosis not present

## 2022-06-18 DIAGNOSIS — Z7984 Long term (current) use of oral hypoglycemic drugs: Secondary | ICD-10-CM | POA: Diagnosis not present

## 2022-06-19 DIAGNOSIS — Z86711 Personal history of pulmonary embolism: Secondary | ICD-10-CM | POA: Diagnosis not present

## 2022-06-19 DIAGNOSIS — Z7901 Long term (current) use of anticoagulants: Secondary | ICD-10-CM | POA: Diagnosis not present

## 2022-06-19 DIAGNOSIS — U071 COVID-19: Secondary | ICD-10-CM | POA: Diagnosis not present

## 2022-06-19 DIAGNOSIS — E1169 Type 2 diabetes mellitus with other specified complication: Secondary | ICD-10-CM | POA: Diagnosis not present

## 2022-06-19 DIAGNOSIS — Z86718 Personal history of other venous thrombosis and embolism: Secondary | ICD-10-CM | POA: Diagnosis not present

## 2022-06-19 DIAGNOSIS — E782 Mixed hyperlipidemia: Secondary | ICD-10-CM | POA: Diagnosis not present

## 2022-06-19 DIAGNOSIS — I480 Paroxysmal atrial fibrillation: Secondary | ICD-10-CM | POA: Diagnosis not present

## 2022-06-19 DIAGNOSIS — I110 Hypertensive heart disease with heart failure: Secondary | ICD-10-CM | POA: Diagnosis not present

## 2022-06-19 DIAGNOSIS — Z9181 History of falling: Secondary | ICD-10-CM | POA: Diagnosis not present

## 2022-06-19 DIAGNOSIS — K219 Gastro-esophageal reflux disease without esophagitis: Secondary | ICD-10-CM | POA: Diagnosis not present

## 2022-06-19 DIAGNOSIS — M109 Gout, unspecified: Secondary | ICD-10-CM | POA: Diagnosis not present

## 2022-06-19 DIAGNOSIS — G4733 Obstructive sleep apnea (adult) (pediatric): Secondary | ICD-10-CM | POA: Diagnosis not present

## 2022-06-19 DIAGNOSIS — I5032 Chronic diastolic (congestive) heart failure: Secondary | ICD-10-CM | POA: Diagnosis not present

## 2022-06-19 DIAGNOSIS — Z7984 Long term (current) use of oral hypoglycemic drugs: Secondary | ICD-10-CM | POA: Diagnosis not present

## 2022-06-19 DIAGNOSIS — E876 Hypokalemia: Secondary | ICD-10-CM | POA: Diagnosis not present

## 2022-06-20 ENCOUNTER — Other Ambulatory Visit: Payer: Self-pay | Admitting: *Deleted

## 2022-06-20 DIAGNOSIS — I712 Thoracic aortic aneurysm, without rupture, unspecified: Secondary | ICD-10-CM

## 2022-06-21 DIAGNOSIS — I5032 Chronic diastolic (congestive) heart failure: Secondary | ICD-10-CM | POA: Diagnosis not present

## 2022-06-21 DIAGNOSIS — Z7984 Long term (current) use of oral hypoglycemic drugs: Secondary | ICD-10-CM | POA: Diagnosis not present

## 2022-06-21 DIAGNOSIS — E782 Mixed hyperlipidemia: Secondary | ICD-10-CM | POA: Diagnosis not present

## 2022-06-21 DIAGNOSIS — Z86711 Personal history of pulmonary embolism: Secondary | ICD-10-CM | POA: Diagnosis not present

## 2022-06-21 DIAGNOSIS — I480 Paroxysmal atrial fibrillation: Secondary | ICD-10-CM | POA: Diagnosis not present

## 2022-06-21 DIAGNOSIS — U071 COVID-19: Secondary | ICD-10-CM | POA: Diagnosis not present

## 2022-06-21 DIAGNOSIS — Z9181 History of falling: Secondary | ICD-10-CM | POA: Diagnosis not present

## 2022-06-21 DIAGNOSIS — E876 Hypokalemia: Secondary | ICD-10-CM | POA: Diagnosis not present

## 2022-06-21 DIAGNOSIS — Z86718 Personal history of other venous thrombosis and embolism: Secondary | ICD-10-CM | POA: Diagnosis not present

## 2022-06-21 DIAGNOSIS — M109 Gout, unspecified: Secondary | ICD-10-CM | POA: Diagnosis not present

## 2022-06-21 DIAGNOSIS — I110 Hypertensive heart disease with heart failure: Secondary | ICD-10-CM | POA: Diagnosis not present

## 2022-06-21 DIAGNOSIS — G4733 Obstructive sleep apnea (adult) (pediatric): Secondary | ICD-10-CM | POA: Diagnosis not present

## 2022-06-21 DIAGNOSIS — Z7901 Long term (current) use of anticoagulants: Secondary | ICD-10-CM | POA: Diagnosis not present

## 2022-06-21 DIAGNOSIS — E1169 Type 2 diabetes mellitus with other specified complication: Secondary | ICD-10-CM | POA: Diagnosis not present

## 2022-06-21 DIAGNOSIS — K219 Gastro-esophageal reflux disease without esophagitis: Secondary | ICD-10-CM | POA: Diagnosis not present

## 2022-06-26 DIAGNOSIS — Z86718 Personal history of other venous thrombosis and embolism: Secondary | ICD-10-CM | POA: Diagnosis not present

## 2022-06-26 DIAGNOSIS — I5032 Chronic diastolic (congestive) heart failure: Secondary | ICD-10-CM | POA: Diagnosis not present

## 2022-06-26 DIAGNOSIS — E876 Hypokalemia: Secondary | ICD-10-CM | POA: Diagnosis not present

## 2022-06-26 DIAGNOSIS — U071 COVID-19: Secondary | ICD-10-CM | POA: Diagnosis not present

## 2022-06-26 DIAGNOSIS — E1169 Type 2 diabetes mellitus with other specified complication: Secondary | ICD-10-CM | POA: Diagnosis not present

## 2022-06-26 DIAGNOSIS — Z7984 Long term (current) use of oral hypoglycemic drugs: Secondary | ICD-10-CM | POA: Diagnosis not present

## 2022-06-26 DIAGNOSIS — G4733 Obstructive sleep apnea (adult) (pediatric): Secondary | ICD-10-CM | POA: Diagnosis not present

## 2022-06-26 DIAGNOSIS — E782 Mixed hyperlipidemia: Secondary | ICD-10-CM | POA: Diagnosis not present

## 2022-06-26 DIAGNOSIS — M109 Gout, unspecified: Secondary | ICD-10-CM | POA: Diagnosis not present

## 2022-06-26 DIAGNOSIS — Z86711 Personal history of pulmonary embolism: Secondary | ICD-10-CM | POA: Diagnosis not present

## 2022-06-26 DIAGNOSIS — K219 Gastro-esophageal reflux disease without esophagitis: Secondary | ICD-10-CM | POA: Diagnosis not present

## 2022-06-26 DIAGNOSIS — I110 Hypertensive heart disease with heart failure: Secondary | ICD-10-CM | POA: Diagnosis not present

## 2022-06-26 DIAGNOSIS — I480 Paroxysmal atrial fibrillation: Secondary | ICD-10-CM | POA: Diagnosis not present

## 2022-06-26 DIAGNOSIS — Z9181 History of falling: Secondary | ICD-10-CM | POA: Diagnosis not present

## 2022-06-26 DIAGNOSIS — Z7901 Long term (current) use of anticoagulants: Secondary | ICD-10-CM | POA: Diagnosis not present

## 2022-06-27 ENCOUNTER — Encounter: Payer: Self-pay | Admitting: Nurse Practitioner

## 2022-06-27 ENCOUNTER — Ambulatory Visit: Payer: Medicare Other | Attending: Nurse Practitioner | Admitting: Nurse Practitioner

## 2022-06-27 VITALS — BP 110/66 | HR 74 | Ht 63.0 in | Wt 276.0 lb

## 2022-06-27 DIAGNOSIS — I10 Essential (primary) hypertension: Secondary | ICD-10-CM

## 2022-06-27 DIAGNOSIS — I7121 Aneurysm of the ascending aorta, without rupture: Secondary | ICD-10-CM

## 2022-06-27 DIAGNOSIS — G4733 Obstructive sleep apnea (adult) (pediatric): Secondary | ICD-10-CM | POA: Diagnosis not present

## 2022-06-27 DIAGNOSIS — E119 Type 2 diabetes mellitus without complications: Secondary | ICD-10-CM

## 2022-06-27 DIAGNOSIS — I5032 Chronic diastolic (congestive) heart failure: Secondary | ICD-10-CM | POA: Diagnosis not present

## 2022-06-27 DIAGNOSIS — I480 Paroxysmal atrial fibrillation: Secondary | ICD-10-CM | POA: Diagnosis not present

## 2022-06-27 MED ORDER — TORSEMIDE 20 MG PO TABS: 20.0000 mg | ORAL_TABLET | Freq: Two times a day (BID) | ORAL | 3 refills | Status: DC

## 2022-06-27 NOTE — Patient Instructions (Signed)
Medication Instructions:  Stop Lasix. Start Torsemide 20 mg ( Take 1 Tablet Twice Daily . 1 Tablet In the Morning, 1 Tablet in the Afternoon). *If you need a refill on your cardiac medications before your next appointment, please call your pharmacy*   Lab Work: BNP Today. BMET In 1 Week. If you have labs (blood work) drawn today and your tests are completely normal, you will receive your results only by: [facility] (if you have MyChart) OR A paper copy in the mail If you have any lab test that is abnormal or we need to change your treatment, we will call you to review the results.   Testing/Procedures: No Testing   Follow-Up: At [facility], you and your health needs are our priority.  As part of our continuing mission to provide you with exceptional heart care, we have created designated Provider Care Teams.  These Care Teams include your primary Cardiologist (physician) and Advanced Practice Providers (APPs -  Physician Assistants and Nurse Practitioners) who all work together to provide you with the care you need, when you need it.  We recommend signing up for the patient portal called "MyChart".  Sign up information is provided on this After Visit Summary.  MyChart is used to connect with patients for Virtual Visits (Telemedicine).  Patients are able to view lab/test results, encounter notes, upcoming appointments, etc.  Non-urgent messages can be sent to your provider as well.   To learn more about what you can do with MyChart, go to [email or web address].    Your next appointment:   1 month(s)  The format for your next appointment:   In Person  Provider:   Diona Browner, NP         Important Information About Sugar

## 2022-06-27 NOTE — Progress Notes (Signed)
Office Visit    Patient Name: Daune Divirgilio [place] Date of Encounter: 06/27/2022  Primary Care Provider:  Josetta Huddle, MD Primary Cardiologist:  Kirk Ruths, MD  Chief Complaint    81 year old female with a history of paroxysmal atrial fibrillation, chronic diastolic heart failure, hypertension, ascending aortic aneurysm, type 2 diabetes, OSA, DVT/PE, GERD, and gout who presents for follow-up related to atrial fibrillation.  Past Medical History    Past Medical History:  Diagnosis Date   Anemia    Arthritis    CHF (congestive heart failure) (HCC)    DVT (deep venous thrombosis) ([place]) 01/2009   Dysrhythmia    GERD (gastroesophageal reflux disease)    Gout    History of blood transfusion    1990's   History of colonic polyps 09/2010   History of pneumonia    Hyperlipidemia    Hypertension    Lactose intolerance    Macular degeneration, dry    bilateral   Migraine    Obesity    Osteopenia    PAF (paroxysmal atrial fibrillation) (HCC)    Peripheral neuropathy    Pulmonary embolism ([place]) 01/2009   Sleep apnea    CPAP pressure 4.0-16.0   Thoracic ascending aortic aneurysm (HCC)    Type II diabetes mellitus ([place])    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)    Urinary incontinence    Past Surgical History:  Procedure Laterality Date   [place] N/A 05/07/2018   Procedure: CARDIOVERSION;  Surgeon: Josue Hector, MD;  Location: [facility] ENDOSCOPY;  Service: Cardiovascular;  Laterality: N/A;   COLONOSCOPY W/ BIOPSIES AND POLYPECTOMY  2005; 2011; 2012   COLONOSCOPY WITH PROPOFOL N/A 09/10/2016   Procedure: COLONOSCOPY WITH PROPOFOL;  Surgeon: Garlan Fair, MD;  Location: [facility] ENDOSCOPY;  Service: Endoscopy;  Laterality: N/A;   JOINT REPLACEMENT     KNEE ARTHROSCOPY Left ~ [place] Left 2011   TOTAL KNEE ARTHROPLASTY Bilateral 2005-2012    left-right    Allergies  Allergies  Allergen Reactions   Bactrim [Sulfamethoxazole-Trimethoprim]     Drop in BP   Morphine And Related Other (See Comments)    Unresponsive-Very bad reaction.  Can take hydrocodone.    Oysters [Shellfish Allergy] Shortness Of Breath, Itching, Swelling and Rash   Amlodipine Swelling and Other (See Comments)    Of legs   Percocet [Oxycodone-Acetaminophen] Other (See Comments)    hallucinations   Tdap [Tetanus-Diphth-Acell Pertussis] Itching, Swelling and Rash   Tetanus Toxoids Itching, Swelling and Rash   Tramadol Swelling and Other (See Comments)    Of legs   Carbamazepine Other (See Comments)   Lactose Intolerance (Gi)    Macrodantin [Nitrofurantoin] Other (See Comments)   Other Other (See Comments)   Tetanus-Diphtheria Toxoids Td Other (See Comments)   Topiramate Other (See Comments)   Diovan [Valsartan] Other (See Comments)    Cough   Diphtheria Toxoid Itching, Swelling and Rash   Macrodantin [Nitrofurantoin Macrocrystal] Other (See Comments)    unspecified    History of Present Illness    81 year old female with the above past medical history including paroxysmal atrial fibrillation, chronic diastolic heart failure, hypertension, ascending aortic aneurysm, type 2 diabetes, OSA, DVT/PE, GERD, and gout.  She has followed primarily in the A-fib clinic.  Echo in 2018 was normal.  She was initially diagnosed with atrial fibrillation in 2020 at a  follow-up visit with her PCP-she reported a history of not feeling well at the time.  She was started on Xarelto, however, this was later switched to Coumadin. She underwent successful DCCV in 05/2019.  She had recurrent atrial fibrillation in October 2020.  She was loaded with Tikosyn in 08/2019.  She developed recurrent atrial fibrillation with RVR during hospitalization in the fall 2022 in the setting of sepsis due to right leg cellulitis.  She was last seen in the office on 11/13/2021 and was stable from  a cardiac standpoint.  She was maintaining sinus rhythm at the time.  She also has a history of ascending aortic aneurysm, monitored by Dr. Cyndia Bent with CT surgery.  She was hospitalized in July 2023 in the setting of COVID-19 fall, and A-fib with RVR.  Repeat echocardiogram showed EF 70 to 75%, no RWMA, indeterminate diastolic parameters.  Metoprolol was increased.  She returned to the ED on 05/28/2022 following a fall.  She was noted to have elevated creatinine at 2.9 as well as a contusion to her left elbow, she was given IV fluids and advised to follow-up with her cardiologist as an outpatient. She was maintaining NSR at the time.   She presents today for follow-up. Since her last visit she has stable overall from a cardiac standpoint though she does note a recent increase in bilateral lower extremity edema.  She is unable to tie her shoes.  He is taking Lasix alternating 40 mg and 60 mg every other day.  She notes she has been unable to tolerate higher doses of Lasix due to the fact that all she can do is "go to the bathroom."  She was recently treated for lower extremity cellulitis.  Her weight has been stable, though she does note increased dyspnea on exertion.  She denies chest pain, palpitations, dizziness, PND, orthopnea.  Other than her ongoing bilateral lower extremity edema and dyspnea on exertion, she denies any additional concerns today.  Home Medications    Current Outpatient Medications  Medication Sig Dispense Refill   acetaminophen (TYLENOL) 500 MG tablet Take 500 mg by mouth at bedtime.     allopurinol (ZYLOPRIM) 300 MG tablet Take 0.5 tablets (150 mg total) by mouth every evening. 15 tablet 0   celecoxib (CELEBREX) 100 MG capsule 1 capsule     Cholecalciferol (VITAMIN D3) 25 MCG (1000 UT) CAPS Take 4,000 Units by mouth daily in the afternoon.     clobetasol cream (TEMOVATE) 4.25 % Apply 1 application topically as needed. 30 g 0   Dextromethorphan-guaiFENesin (MUCINEX DM) 30-600 MG  TB12 1 tablet     dofetilide (TIKOSYN) 250 MCG capsule Take 1 capsule (250 mcg total) by mouth 2 (two) times daily. 180 capsule 2   famotidine (PEPCID) 20 MG tablet Take 20 mg by mouth 2 (two) times daily.     HYDROcodone-acetaminophen (NORCO/VICODIN) 5-325 MG tablet Take 1 tablet by mouth every 6 (six) hours as needed for moderate pain.     Lancet Devices (ONETOUCH DELICA PLUS LANCING) MISC      Lancets (ONETOUCH DELICA PLUS [id number]) MISC Apply 1 each topically 3 (three) times daily.     losartan (COZAAR) 25 MG tablet Take 1 tablet (25 mg total) by mouth daily. 30 tablet 0   magnesium oxide (MAG-OX) 400 MG tablet Take 1 tablet (400 mg total) by mouth at bedtime. 30 tablet 0   metFORMIN (GLUCOPHAGE) 500 MG tablet Take 1 tablet (500 mg total) by mouth 2 (two) times daily with  a meal. 60 tablet 0   metoprolol tartrate (LOPRESSOR) 25 MG tablet Take 2 tablets (50 mg total) by mouth 2 (two) times daily. 60 tablet 1   Multiple Vitamins-Minerals (PRESERVISION AREDS 2) CAPS Take 2 capsules by mouth daily.     Naphazoline-Hydroxyprop Mcell 0.03-0.5 % SOLN Place 1 drop into both eyes 2 (two) times daily.     ONETOUCH ULTRA test strip      potassium chloride SA (KLOR-CON M) 20 MEQ tablet Take 1.5 tablets (30 mEq total) by mouth daily. (Patient taking differently: Take 40 mEq by mouth daily.) 45 tablet 0   rosuvastatin (CRESTOR) 5 MG tablet Take 5 mg by mouth daily.     torsemide (DEMADEX) 20 MG tablet Take 1 tablet (20 mg total) by mouth 2 (two) times daily. 180 tablet 3   vitamin B-12 (CYANOCOBALAMIN) 500 MCG tablet Take 500 mcg by mouth daily.     Vitamin K, Phytonadione, 100 MCG TABS Take 10 tablets by mouth daily. For 3 days start on 05/28/22     warfarin (COUMADIN) 5 MG tablet Take 1 tablet (5 mg total) by mouth daily. 5 mg daily except for Monday take 1/2 tablet 30 tablet 0   No current facility-administered medications for this visit.     Review of Systems    She denies chest pain,  palpitations, pnd, orthopnea, n, v, dizziness, syncope, weight gain, or early satiety. All other systems reviewed and are otherwise negative except as noted above.   Physical Exam    VS:  BP 110/66 (BP Location: Left Arm, Patient Position: Sitting, Cuff Size: Large)   Pulse 74   Ht 5' 3"  (1.6 m)   Wt 276 lb (125.2 kg)   BMI 48.89 kg/m  GEN: Well nourished, well developed, in no acute distress. HEENT: normal. Neck: Supple, no JVD, carotid bruits, or masses. Cardiac: RRR, no murmurs, rubs, or gallops. No clubbing, cyanosis, 2+ pitting bilateral lower extremity edema.  Radials/DP/PT 2+ and equal bilaterally.  Respiratory:  Respirations regular and unlabored, clear to auscultation bilaterally. GI: Soft, nontender, nondistended, BS + x 4. MS: no deformity or atrophy. Skin: warm and dry, no rash. Neuro:  Strength and sensation are intact. Psych: Normal affect.  Accessory Clinical Findings    ECG personally reviewed by me today -sinus rhythm, 87 bpm, first-degree AV block- no acute changes.   Lab Results  Component Value Date   WBC 8.4 05/28/2022   HGB 10.0 (L) 05/28/2022   HCT 31.9 (L) 05/28/2022   MCV 90.9 05/28/2022   PLT 265 05/28/2022   Lab Results  Component Value Date   CREATININE 0.89 05/28/2022   BUN 39 (H) 05/28/2022   NA 136 05/28/2022   K 4.6 05/28/2022   CL 103 05/28/2022   CO2 24 05/28/2022   Lab Results  Component Value Date   ALT 13 05/28/2022   AST 21 05/28/2022   ALKPHOS 68 05/28/2022   BILITOT 0.8 05/28/2022   Lab Results  Component Value Date   CHOL  02/07/2009    136        ATP III CLASSIFICATION:  <200     mg/dL   Desirable  200-239  mg/dL   Borderline High  >=240    mg/dL   High          HDL 45 02/07/2009   LDLCALC  02/07/2009    74        Total Cholesterol/HDL:CHD Risk Coronary Heart Disease Risk Table  Men   Women  1/2 Average Risk   3.4   3.3  Average Risk       5.0   4.4  2 X Average Risk   9.6   7.1  3 X  Average Risk  23.4   11.0        Use the calculated Patient Ratio above and the CHD Risk Table to determine the patient's CHD Risk.        ATP III CLASSIFICATION (LDL):  <100     mg/dL   Optimal  100-129  mg/dL   Near or Above                    Optimal  130-159  mg/dL   Borderline  160-189  mg/dL   High  >190     mg/dL   Very High   TRIG 84 02/07/2009   CHOLHDL 3.0 02/07/2009    Lab Results  Component Value Date   HGBA1C 6.4 (H) 05/01/2022    Assessment & Plan    1. PAF: Maintaining NSR. QT stable on EKG today. Continue Coumadin, metoprolol, Tikosyn.   2. Chronic diastolic heart failure: Echo in 04/2022 showed EF 70 to 75%, no RWMA, indeterminate diastolic parameters.  She has 2+ pitting bilateral lower extremity edema.  Recently treated for lower extremity cellulitis.  She does have chronic bilateral pedal edema, however, she states this is worse than baseline.  She states it is more significant than her baseline. Will check BNP. Will stop Lasix and trial transition to torsemide 40 mg daily (20 mg in the a.m. and 20 mg in the p.m.) with plan for close follow-up. Check BMET in 1 week. Discussed ED precautions, weights, sodium and fluid recommendations.  3. Hypertension: BP well controlled. Continue current antihypertensive regimen.  Continue to monitor BP with change in diuretic therapy.  Report SBP consistently <100.   4. Ascending aortic aneurysm: Following with Dr. Cyndia Bent.  Repeat CT scheduled for 07/2022.  5. Type 2 diabetes: A1C was 6.4 in 04/2022.  Monitor managed per PCP.  6. OSA: She has not used her CPAP since her COVID infection in July 2023.  Encouraged adherence to CPAP.  7. Disposition: Follow-up in 1 month.      Lenna Sciara, NP 06/27/2022, 5:02 PM

## 2022-06-28 ENCOUNTER — Telehealth: Payer: Self-pay

## 2022-06-28 DIAGNOSIS — I5032 Chronic diastolic (congestive) heart failure: Secondary | ICD-10-CM | POA: Diagnosis not present

## 2022-06-28 DIAGNOSIS — Z86718 Personal history of other venous thrombosis and embolism: Secondary | ICD-10-CM | POA: Diagnosis not present

## 2022-06-28 DIAGNOSIS — Z86711 Personal history of pulmonary embolism: Secondary | ICD-10-CM | POA: Diagnosis not present

## 2022-06-28 DIAGNOSIS — I110 Hypertensive heart disease with heart failure: Secondary | ICD-10-CM | POA: Diagnosis not present

## 2022-06-28 DIAGNOSIS — I480 Paroxysmal atrial fibrillation: Secondary | ICD-10-CM | POA: Diagnosis not present

## 2022-06-28 DIAGNOSIS — Z7984 Long term (current) use of oral hypoglycemic drugs: Secondary | ICD-10-CM | POA: Diagnosis not present

## 2022-06-28 DIAGNOSIS — M109 Gout, unspecified: Secondary | ICD-10-CM | POA: Diagnosis not present

## 2022-06-28 DIAGNOSIS — E782 Mixed hyperlipidemia: Secondary | ICD-10-CM | POA: Diagnosis not present

## 2022-06-28 DIAGNOSIS — K219 Gastro-esophageal reflux disease without esophagitis: Secondary | ICD-10-CM | POA: Diagnosis not present

## 2022-06-28 DIAGNOSIS — E1169 Type 2 diabetes mellitus with other specified complication: Secondary | ICD-10-CM | POA: Diagnosis not present

## 2022-06-28 DIAGNOSIS — Z7901 Long term (current) use of anticoagulants: Secondary | ICD-10-CM | POA: Diagnosis not present

## 2022-06-28 DIAGNOSIS — G4733 Obstructive sleep apnea (adult) (pediatric): Secondary | ICD-10-CM | POA: Diagnosis not present

## 2022-06-28 DIAGNOSIS — Z9181 History of falling: Secondary | ICD-10-CM | POA: Diagnosis not present

## 2022-06-28 DIAGNOSIS — E876 Hypokalemia: Secondary | ICD-10-CM | POA: Diagnosis not present

## 2022-06-28 DIAGNOSIS — U071 COVID-19: Secondary | ICD-10-CM | POA: Diagnosis not present

## 2022-06-28 LAB — BRAIN NATRIURETIC PEPTIDE: BNP: 68.6 pg/mL (ref 0.0–100.0)

## 2022-06-28 NOTE — Telephone Encounter (Signed)
Spoke with pt. Pt was notified of lab results and recommendations. Pt will repeat labs and follow up as planned.

## 2022-07-01 NOTE — Addendum Note (Signed)
Addended by: Merri Ray A on: 07/01/2022 11:25 AM   Modules accepted: Orders

## 2022-07-03 DIAGNOSIS — E876 Hypokalemia: Secondary | ICD-10-CM | POA: Diagnosis not present

## 2022-07-03 DIAGNOSIS — G4733 Obstructive sleep apnea (adult) (pediatric): Secondary | ICD-10-CM | POA: Diagnosis not present

## 2022-07-03 DIAGNOSIS — Z7984 Long term (current) use of oral hypoglycemic drugs: Secondary | ICD-10-CM | POA: Diagnosis not present

## 2022-07-03 DIAGNOSIS — I5032 Chronic diastolic (congestive) heart failure: Secondary | ICD-10-CM | POA: Diagnosis not present

## 2022-07-03 DIAGNOSIS — Z9181 History of falling: Secondary | ICD-10-CM | POA: Diagnosis not present

## 2022-07-03 DIAGNOSIS — K219 Gastro-esophageal reflux disease without esophagitis: Secondary | ICD-10-CM | POA: Diagnosis not present

## 2022-07-03 DIAGNOSIS — M109 Gout, unspecified: Secondary | ICD-10-CM | POA: Diagnosis not present

## 2022-07-03 DIAGNOSIS — Z86711 Personal history of pulmonary embolism: Secondary | ICD-10-CM | POA: Diagnosis not present

## 2022-07-03 DIAGNOSIS — E1169 Type 2 diabetes mellitus with other specified complication: Secondary | ICD-10-CM | POA: Diagnosis not present

## 2022-07-03 DIAGNOSIS — E782 Mixed hyperlipidemia: Secondary | ICD-10-CM | POA: Diagnosis not present

## 2022-07-03 DIAGNOSIS — Z7901 Long term (current) use of anticoagulants: Secondary | ICD-10-CM | POA: Diagnosis not present

## 2022-07-03 DIAGNOSIS — Z86718 Personal history of other venous thrombosis and embolism: Secondary | ICD-10-CM | POA: Diagnosis not present

## 2022-07-03 DIAGNOSIS — U071 COVID-19: Secondary | ICD-10-CM | POA: Diagnosis not present

## 2022-07-03 DIAGNOSIS — I110 Hypertensive heart disease with heart failure: Secondary | ICD-10-CM | POA: Diagnosis not present

## 2022-07-03 DIAGNOSIS — I480 Paroxysmal atrial fibrillation: Secondary | ICD-10-CM | POA: Diagnosis not present

## 2022-07-05 DIAGNOSIS — K219 Gastro-esophageal reflux disease without esophagitis: Secondary | ICD-10-CM | POA: Diagnosis not present

## 2022-07-05 DIAGNOSIS — E1169 Type 2 diabetes mellitus with other specified complication: Secondary | ICD-10-CM | POA: Diagnosis not present

## 2022-07-05 DIAGNOSIS — U071 COVID-19: Secondary | ICD-10-CM | POA: Diagnosis not present

## 2022-07-05 DIAGNOSIS — E782 Mixed hyperlipidemia: Secondary | ICD-10-CM | POA: Diagnosis not present

## 2022-07-05 DIAGNOSIS — Z7984 Long term (current) use of oral hypoglycemic drugs: Secondary | ICD-10-CM | POA: Diagnosis not present

## 2022-07-05 DIAGNOSIS — I5032 Chronic diastolic (congestive) heart failure: Secondary | ICD-10-CM | POA: Diagnosis not present

## 2022-07-05 DIAGNOSIS — Z7901 Long term (current) use of anticoagulants: Secondary | ICD-10-CM | POA: Diagnosis not present

## 2022-07-05 DIAGNOSIS — E876 Hypokalemia: Secondary | ICD-10-CM | POA: Diagnosis not present

## 2022-07-05 DIAGNOSIS — G4733 Obstructive sleep apnea (adult) (pediatric): Secondary | ICD-10-CM | POA: Diagnosis not present

## 2022-07-05 DIAGNOSIS — I480 Paroxysmal atrial fibrillation: Secondary | ICD-10-CM | POA: Diagnosis not present

## 2022-07-05 DIAGNOSIS — Z9181 History of falling: Secondary | ICD-10-CM | POA: Diagnosis not present

## 2022-07-05 DIAGNOSIS — Z86718 Personal history of other venous thrombosis and embolism: Secondary | ICD-10-CM | POA: Diagnosis not present

## 2022-07-05 DIAGNOSIS — Z86711 Personal history of pulmonary embolism: Secondary | ICD-10-CM | POA: Diagnosis not present

## 2022-07-05 DIAGNOSIS — I110 Hypertensive heart disease with heart failure: Secondary | ICD-10-CM | POA: Diagnosis not present

## 2022-07-05 DIAGNOSIS — M109 Gout, unspecified: Secondary | ICD-10-CM | POA: Diagnosis not present

## 2022-07-08 DIAGNOSIS — E119 Type 2 diabetes mellitus without complications: Secondary | ICD-10-CM | POA: Diagnosis not present

## 2022-07-08 DIAGNOSIS — I480 Paroxysmal atrial fibrillation: Secondary | ICD-10-CM | POA: Diagnosis not present

## 2022-07-08 DIAGNOSIS — I5032 Chronic diastolic (congestive) heart failure: Secondary | ICD-10-CM | POA: Diagnosis not present

## 2022-07-08 DIAGNOSIS — I7121 Aneurysm of the ascending aorta, without rupture: Secondary | ICD-10-CM | POA: Diagnosis not present

## 2022-07-08 DIAGNOSIS — I10 Essential (primary) hypertension: Secondary | ICD-10-CM | POA: Diagnosis not present

## 2022-07-08 LAB — BASIC METABOLIC PANEL
BUN/Creatinine Ratio: 40 — ABNORMAL HIGH (ref 12–28)
BUN: 21 mg/dL (ref 8–27)
CO2: 23 mmol/L (ref 20–29)
Calcium: 9.7 mg/dL (ref 8.7–10.3)
Chloride: 101 mmol/L (ref 96–106)
Creatinine, Ser: 0.53 mg/dL — ABNORMAL LOW (ref 0.57–1.00)
Glucose: 106 mg/dL — ABNORMAL HIGH (ref 70–99)
Potassium: 4.8 mmol/L (ref 3.5–5.2)
Sodium: 139 mmol/L (ref 134–144)
eGFR: 93 mL/min/{1.73_m2} (ref >59)

## 2022-07-12 ENCOUNTER — Telehealth: Payer: Self-pay

## 2022-07-12 NOTE — Telephone Encounter (Signed)
Spoke with pt. Pt was notified of lab results and recommendations.  ?

## 2022-07-22 ENCOUNTER — Encounter: Payer: Self-pay | Admitting: Podiatry

## 2022-07-22 ENCOUNTER — Ambulatory Visit: Payer: Medicare Other | Admitting: Podiatry

## 2022-07-22 ENCOUNTER — Other Ambulatory Visit: Payer: Self-pay | Admitting: Physician Assistant

## 2022-07-22 ENCOUNTER — Ambulatory Visit
Admission: RE | Admit: 2022-07-22 | Discharge: 2022-07-22 | Disposition: A | Discharge status: Home / Self Care | Payer: Medicare Other | Source: Ambulatory Visit | Attending: Physician Assistant | Admitting: Physician Assistant

## 2022-07-22 DIAGNOSIS — M79674 Pain in right toe(s): Secondary | ICD-10-CM | POA: Diagnosis not present

## 2022-07-22 DIAGNOSIS — R051 Acute cough: Secondary | ICD-10-CM

## 2022-07-22 DIAGNOSIS — M79675 Pain in left toe(s): Secondary | ICD-10-CM | POA: Diagnosis not present

## 2022-07-22 DIAGNOSIS — B351 Tinea unguium: Secondary | ICD-10-CM | POA: Diagnosis not present

## 2022-07-22 DIAGNOSIS — R059 Cough, unspecified: Secondary | ICD-10-CM | POA: Diagnosis not present

## 2022-07-22 DIAGNOSIS — E1142 Type 2 diabetes mellitus with diabetic polyneuropathy: Secondary | ICD-10-CM | POA: Diagnosis not present

## 2022-07-22 DIAGNOSIS — Z7901 Long term (current) use of anticoagulants: Secondary | ICD-10-CM | POA: Diagnosis not present

## 2022-07-22 DIAGNOSIS — M21619 Bunion of unspecified foot: Secondary | ICD-10-CM

## 2022-07-22 DIAGNOSIS — M2041 Other hammer toe(s) (acquired), right foot: Secondary | ICD-10-CM

## 2022-07-22 DIAGNOSIS — R0602 Shortness of breath: Secondary | ICD-10-CM | POA: Diagnosis not present

## 2022-07-22 DIAGNOSIS — M2042 Other hammer toe(s) (acquired), left foot: Secondary | ICD-10-CM

## 2022-07-22 NOTE — Progress Notes (Signed)
This patient returns to my office for at risk foot care.  This patient requires this care by a professional since this patient will be at risk due to having DVT, Type 2 DM with neuropathy.  This patient is unable to cut nails herself since the patient cannot reach her nails.These nails are painful walking and wearing shoes.  This patient presents for at risk foot care today.  General Appearance  Alert, conversant and in no acute stress.  Vascular  Dorsalis pedis and posterior tibial  pulses are palpable  bilaterally.  Capillary return is within normal limits  bilaterally. Temperature is within normal limits  bilaterally.  Neurologic  Senn-Weinstein monofilament wire test within normal limits  bilaterally. Muscle power within normal limits bilaterally.  Nails Thick disfigured discolored nails with subungual debris  from hallux to fifth toes bilaterally. No evidence of bacterial infection or drainage bilaterally.  Orthopedic  No limitations of motion  feet .  No crepitus or effusions noted.  Severe  HAV  B/L.  Hammer toes  B/L.  Skin  normotropic skin with no porokeratosis noted bilaterally.  No signs of infections or ulcers noted.     Onychomycosis  Pain in right toes  Pain in left toes  Consent was obtained for treatment procedures.   Mechanical debridement of nails 1-5  bilaterally performed with a nail nipper.  Filed with dremel without incident. Checked on diabetic shoes with Christan.   Return office visit   3 months                   Told patient to return for periodic foot care and evaluation due to potential at risk complications.   Gardiner Barefoot DPM

## 2022-07-30 ENCOUNTER — Encounter (INDEPENDENT_AMBULATORY_CARE_PROVIDER_SITE_OTHER): Payer: Medicare Other | Admitting: Ophthalmology

## 2022-07-30 DIAGNOSIS — H353112 Nonexudative age-related macular degeneration, right eye, intermediate dry stage: Secondary | ICD-10-CM | POA: Diagnosis not present

## 2022-07-30 DIAGNOSIS — H353221 Exudative age-related macular degeneration, left eye, with active choroidal neovascularization: Secondary | ICD-10-CM

## 2022-07-30 DIAGNOSIS — I10 Essential (primary) hypertension: Secondary | ICD-10-CM

## 2022-07-30 DIAGNOSIS — H35033 Hypertensive retinopathy, bilateral: Secondary | ICD-10-CM | POA: Diagnosis not present

## 2022-07-30 DIAGNOSIS — H43813 Vitreous degeneration, bilateral: Secondary | ICD-10-CM

## 2022-08-06 ENCOUNTER — Encounter: Payer: Self-pay | Admitting: Nurse Practitioner

## 2022-08-06 ENCOUNTER — Ambulatory Visit: Payer: Medicare Other | Attending: Nurse Practitioner | Admitting: Nurse Practitioner

## 2022-08-06 ENCOUNTER — Ambulatory Visit: Payer: Medicare Other

## 2022-08-06 VITALS — BP 120/74 | HR 136 | Ht 63.0 in | Wt 267.0 lb

## 2022-08-06 DIAGNOSIS — E119 Type 2 diabetes mellitus without complications: Secondary | ICD-10-CM | POA: Diagnosis not present

## 2022-08-06 DIAGNOSIS — I7121 Aneurysm of the ascending aorta, without rupture: Secondary | ICD-10-CM

## 2022-08-06 DIAGNOSIS — G4733 Obstructive sleep apnea (adult) (pediatric): Secondary | ICD-10-CM

## 2022-08-06 DIAGNOSIS — I10 Essential (primary) hypertension: Secondary | ICD-10-CM | POA: Diagnosis not present

## 2022-08-06 DIAGNOSIS — I5032 Chronic diastolic (congestive) heart failure: Secondary | ICD-10-CM

## 2022-08-06 DIAGNOSIS — I4819 Other persistent atrial fibrillation: Secondary | ICD-10-CM | POA: Diagnosis not present

## 2022-08-06 DIAGNOSIS — I480 Paroxysmal atrial fibrillation: Secondary | ICD-10-CM | POA: Diagnosis not present

## 2022-08-06 MED ORDER — METOPROLOL SUCCINATE ER 25 MG PO TB24: 25.0000 mg | ORAL_TABLET | Freq: Every day | ORAL | 3 refills | Status: DC

## 2022-08-06 MED ORDER — METOPROLOL SUCCINATE ER 50 MG PO TB24: 50.0000 mg | ORAL_TABLET | Freq: Every day | ORAL | 3 refills | Status: DC

## 2022-08-06 NOTE — H&P (View-Only) (Signed)
Office Visit    Patient Name: Natasha Glass Date of Encounter: 08/06/2022  Primary Care Provider:  Josetta Huddle, MD Primary Cardiologist:  Kirk Ruths, MD  Chief Complaint    81 year old female with a history of paroxysmal atrial fibrillation, chronic diastolic heart failure, hypertension, ascending aortic aneurysm, type 2 diabetes, OSA, DVT/PE, GERD, and gout who presents for follow-up related to heart failure and atrial fibrillation.  Past Medical History    Past Medical History:  Diagnosis Date   Anemia    Arthritis    CHF (congestive heart failure) (HCC)    DVT (deep venous thrombosis) ([place]) 01/2009   Dysrhythmia    GERD (gastroesophageal reflux disease)    Gout    History of blood transfusion    1990's   History of colonic polyps 09/2010   History of pneumonia    Hyperlipidemia    Hypertension    Lactose intolerance    Macular degeneration, dry    bilateral   Migraine    Obesity    Osteopenia    PAF (paroxysmal atrial fibrillation) (HCC)    Peripheral neuropathy    Pulmonary embolism ([place]) 01/2009   Sleep apnea    CPAP pressure 4.0-16.0   Thoracic ascending aortic aneurysm (HCC)    Type II diabetes mellitus ([place])    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)    Urinary incontinence    Past Surgical History:  Procedure Laterality Date   [place] N/A 05/07/2018   Procedure: CARDIOVERSION;  Surgeon: Josue Hector, MD;  Location: [facility] ENDOSCOPY;  Service: Cardiovascular;  Laterality: N/A;   COLONOSCOPY W/ BIOPSIES AND POLYPECTOMY  2005; 2011; 2012   COLONOSCOPY WITH PROPOFOL N/A 09/10/2016   Procedure: COLONOSCOPY WITH PROPOFOL;  Surgeon: Garlan Fair, MD;  Location: [facility] ENDOSCOPY;  Service: Endoscopy;  Laterality: N/A;   JOINT REPLACEMENT     KNEE ARTHROSCOPY Left ~ [place] Left 2011   TOTAL KNEE ARTHROPLASTY  Bilateral 2005-2012   left-right    Allergies  Allergies  Allergen Reactions   Bactrim [Sulfamethoxazole-Trimethoprim]     Drop in BP   Morphine And Related Other (See Comments)    Unresponsive-Very bad reaction.  Can take hydrocodone.    Oysters [Shellfish Allergy] Shortness Of Breath, Itching, Swelling and Rash   Amlodipine Swelling and Other (See Comments)    Of legs   Percocet [Oxycodone-Acetaminophen] Other (See Comments)    hallucinations   Tdap [Tetanus-Diphth-Acell Pertussis] Itching, Swelling and Rash   Tetanus Toxoids Itching, Swelling and Rash   Tramadol Swelling and Other (See Comments)    Of legs   Carbamazepine Other (See Comments)   Lactose Intolerance (Gi)    Macrodantin [Nitrofurantoin] Other (See Comments)   Other Other (See Comments)   Tetanus-Diphtheria Toxoids Td Other (See Comments)   Topiramate Other (See Comments)   Diovan [Valsartan] Other (See Comments)    Cough   Diphtheria Toxoid Itching, Swelling and Rash   Macrodantin [Nitrofurantoin Macrocrystal] Other (See Comments)    unspecified    History of Present Illness    81 year old female with the above past medical history including paroxysmal atrial fibrillation, chronic diastolic heart failure, hypertension, ascending aortic aneurysm, type 2 diabetes, OSA, DVT/PE, GERD, and gout.   She has followed primarily in the A-fib clinic.  Echo in 2018 was normal.  She was initially diagnosed with atrial fibrillation  in 2020 at a follow-up visit with her PCP-she reported a history of not feeling well at the time.  She was started on Xarelto, however, this was later switched to Coumadin. She underwent successful DCCV in 05/2019.  She had recurrent atrial fibrillation in October 2020.  She was loaded with Tikosyn in 08/2019.  She developed recurrent atrial fibrillation with RVR during hospitalization in the fall 2022 in the setting of sepsis due to right leg cellulitis. She also has a history of ascending aortic  aneurysm, monitored by Dr. Cyndia Bent with CT surgery.  She was hospitalized in July 2023 in the setting of COVID-19 fall, and A-fib with RVR.  Repeat echocardiogram showed EF 70 to 75%, no RWMA, indeterminate diastolic parameters.  Metoprolol was increased.  She returned to the ED on 05/28/2022 following a fall.  She was noted to have elevated creatinine at 2.9 as well as a contusion to her left elbow, she was given IV fluids and advised to follow-up with her cardiologist as an outpatient. She was maintaining NSR at the time.  He was last seen in the office on 06/27/2022 and was stable overall from a cardiac standpoint.  She did note an increase in bilateral lower extremity edema.  She was transitioned from Lasix to torsemide 40 mg daily.  She was maintaining NSR.    She presents today for follow-up. Since her last visit she has been stable overall from a cardiac standpoint though the past 2 days she has noted persistently elevated HR and reports feeling fatigued and "jittery."  EKG today shows atrial fibrillation with RVR, 136 bpm.  She denies any chest pain but does notice some tightness in her chest that radiates to her neck associated with rapid heart rate.  She denies dyspnea, PND, orthopnea, weight gain, her leg edema has improved greatly with transition to torsemide.  Other than her recurrent atrial fibrillation and associated symptoms, she denies any additional concerns today.  Home Medications    Current Outpatient Medications  Medication Sig Dispense Refill   acetaminophen (TYLENOL) 500 MG tablet Take 500 mg by mouth at bedtime.     allopurinol (ZYLOPRIM) 300 MG tablet Take 0.5 tablets (150 mg total) by mouth every evening. 15 tablet 0   celecoxib (CELEBREX) 100 MG capsule 1 capsule     Cholecalciferol (VITAMIN D3) 25 MCG (1000 UT) CAPS Take 4,000 Units by mouth daily in the afternoon.     clobetasol cream (TEMOVATE) 3.82 % Apply 1 application topically as needed. 30 g 0    Dextromethorphan-guaiFENesin (MUCINEX DM) 30-600 MG TB12 1 tablet     dofetilide (TIKOSYN) 250 MCG capsule Take 1 capsule (250 mcg total) by mouth 2 (two) times daily. 180 capsule 2   famotidine (PEPCID) 20 MG tablet Take 20 mg by mouth 2 (two) times daily.     HYDROcodone-acetaminophen (NORCO/VICODIN) 5-325 MG tablet Take 1 tablet by mouth every 6 (six) hours as needed for moderate pain.     Lancet Devices (ONETOUCH DELICA PLUS LANCING) MISC      Lancets (ONETOUCH DELICA PLUS [id number]) MISC Apply 1 each topically 3 (three) times daily.     losartan (COZAAR) 25 MG tablet Take 1 tablet (25 mg total) by mouth daily. 30 tablet 0   magnesium oxide (MAG-OX) 400 MG tablet Take 1 tablet (400 mg total) by mouth at bedtime. 30 tablet 0   metFORMIN (GLUCOPHAGE) 500 MG tablet Take 1 tablet (500 mg total) by mouth 2 (two) times daily with a meal. 60  tablet 0   metoprolol succinate (TOPROL XL) 25 MG 24 hr tablet Take 1 tablet (25 mg total) by mouth daily. 90 tablet 3   metoprolol succinate (TOPROL-XL) 50 MG 24 hr tablet Take 1 tablet (50 mg total) by mouth daily. Take with or immediately following a meal. 90 tablet 3   Multiple Vitamins-Minerals (PRESERVISION AREDS 2) CAPS Take 2 capsules by mouth daily.     Naphazoline-Hydroxyprop Mcell 0.03-0.5 % SOLN Place 1 drop into both eyes 2 (two) times daily.     ONETOUCH ULTRA test strip      potassium chloride SA (KLOR-CON M) 20 MEQ tablet Take 1.5 tablets (30 mEq total) by mouth daily. (Patient taking differently: Take 40 mEq by mouth daily.) 45 tablet 0   rosuvastatin (CRESTOR) 5 MG tablet Take 5 mg by mouth daily.     torsemide (DEMADEX) 20 MG tablet Take 1 tablet (20 mg total) by mouth 2 (two) times daily. 180 tablet 3   vitamin B-12 (CYANOCOBALAMIN) 500 MCG tablet Take 500 mcg by mouth daily.     Vitamin K, Phytonadione, 100 MCG TABS Take 10 tablets by mouth daily. For 3 days start on 05/28/22     warfarin (COUMADIN) 5 MG tablet Take 1 tablet (5 mg total) by  mouth daily. 5 mg daily except for Monday take 1/2 tablet 30 tablet 0   No current facility-administered medications for this visit.     Review of Systems    She denies chest pain, palpitations, dyspnea, pnd, orthopnea, n, v, dizziness, syncope, edema, weight gain, or early satiety. All other systems reviewed and are otherwise negative except as noted above.  Physical Exam    VS:  BP 120/74 (BP Location: Right Arm, Patient Position: Sitting, Cuff Size: Large)   Pulse (!) 136   Ht 5' 3"  (1.6 m)   Wt 267 lb (121.1 kg)   SpO2 94%   BMI 47.30 kg/m   GEN: Well nourished, well developed, in no acute distress. HEENT: normal. Neck: Supple, no JVD, carotid bruits, or masses. Cardiac: IRIR, no murmurs, rubs, or gallops. No clubbing, cyanosis, edema.  Radials/DP/PT 2+ and equal bilaterally.  Respiratory:  Respirations regular and unlabored, clear to auscultation bilaterally. GI: Soft, nontender, nondistended, BS + x 4. MS: no deformity or atrophy. Skin: warm and dry, no rash. Neuro:  Strength and sensation are intact. Psych: Normal affect.  Accessory Clinical Findings    ECG personally reviewed by me today -atrial fibrillation, 136 bpm- no acute changes.   Lab Results  Component Value Date   WBC 8.4 05/28/2022   HGB 10.0 (L) 05/28/2022   HCT 31.9 (L) 05/28/2022   MCV 90.9 05/28/2022   PLT 265 05/28/2022   Lab Results  Component Value Date   CREATININE 0.53 (L) 07/08/2022   BUN 21 07/08/2022   NA 139 07/08/2022   K 4.8 07/08/2022   CL 101 07/08/2022   CO2 23 07/08/2022   Lab Results  Component Value Date   ALT 13 05/28/2022   AST 21 05/28/2022   ALKPHOS 68 05/28/2022   BILITOT 0.8 05/28/2022   Lab Results  Component Value Date   CHOL  02/07/2009    136        ATP III CLASSIFICATION:  <200     mg/dL   Desirable  200-239  mg/dL   Borderline High  >=240    mg/dL   High          HDL 45 02/07/2009   LDLCALC  02/07/2009    74        Total Cholesterol/HDL:CHD  Risk Coronary Heart Disease Risk Table                     Men   Women  1/2 Average Risk   3.4   3.3  Average Risk       5.0   4.4  2 X Average Risk   9.6   7.1  3 X Average Risk  23.4   11.0        Use the calculated Patient Ratio above and the CHD Risk Table to determine the patient's CHD Risk.        ATP III CLASSIFICATION (LDL):  <100     mg/dL   Optimal  100-129  mg/dL   Near or Above                    Optimal  130-159  mg/dL   Borderline  160-189  mg/dL   High  >190     mg/dL   Very High   TRIG 84 02/07/2009   CHOLHDL 3.0 02/07/2009    Lab Results  Component Value Date   HGBA1C 6.4 (H) 05/01/2022    Assessment & Plan    1. Persistent atrial fibrillation: EKG today shows atrial fibrillation with RVR, 136 bpm.  She is symptomatic and reports generalized fatigue, weakness, a tightness in her chest that radiates to her neck that that only occurs in the setting of elevated heart rate.  She reports an increased amount of personal stress.  She has not been using her CPAP at home.  Fluid volume status appears improved since her last visit.  Discussed with Dr. Harriet Masson, DOD.  Will d/c metoprolol tartrate 50 mg bid and start metoprolol succinate 75 mg twice daily.  Continue Tikosyn, Coumadin.  Will plan for TEE/DCCV.  This has been scheduled for 08/13/2022 with Dr. Radford Pax. Discussed ED precautions.  Patient verbalized understanding.  Shared Decision Making/Informed Consent The risks [stroke, cardiac arrhythmias rarely resulting in the need for a temporary or permanent pacemaker, skin irritation or burns, esophageal damage, perforation (1:10,000 risk), bleeding, pharyngeal hematoma as well as other potential complications associated with conscious sedation including aspiration, arrhythmia, respiratory failure and death], benefits (treatment guidance, restoration of normal sinus rhythm, diagnostic support) and alternatives of a transesophageal echocardiogram guided cardioversion were discussed  in detail with Ms. Epting and she is willing to proceed.  2. Chronic diastolic heart failure: Echo in 04/2022 showed EF 70 to 75%, no RWMA, indeterminate diastolic parameters.  She does have chronic bilateral pedal edema, overall, lower extremity edema is much improved since transitioning from furosemide to torsemide.  Weight is down.  She is at risk for acute heart failure exacerbation with recent A-fib with RVR.  Discussed ongoing monitoring with daily weights weights, sodium and fluid recommendations. Continue metoprolol as above, torsemide.  3. Hypertension: BP well controlled. Continue current antihypertensive regimen.    4. Ascending aortic aneurysm: Following with Dr. Cyndia Bent.  Repeat CT scheduled for 07/2022.   5. Type 2 diabetes: A1C was 6.4 in 04/2022.  Monitored and managed per PCP.   6. OSA: She has not used her CPAP since her COVID infection in July 2023.  Encouraged adherence to CPAP.   Marland Kitchen 7. Disposition: Follow-up in 3 weeks in the a fib clinic.      Lenna Sciara, NP 08/06/2022, 1:35 PM

## 2022-08-06 NOTE — Patient Instructions (Addendum)
Medication Instructions:  Stop Metoprolol Tartrate Start Metoprolol Succinate 75 mg twice daily  *If you need a refill on your cardiac medications before your next appointment, please call your pharmacy*   Lab Work: BMET & CBC 1 week prior to Cardioversion  If you have labs (blood work) drawn today and your tests are completely normal, you will receive your results only by: [place] (if you have MyChart) OR A paper copy in the mail If you have any lab test that is abnormal or we need to change your treatment, we will call you to review the results.   Testing/Procedures: Your physician has requested that you have a TEE/Cardioversion. During a TEE, sound waves are used to create images of your heart. It provides your doctor with information about the size and shape of your heart and how well your heart's chambers and valves are working. In this test, a transducer is attached to the end of a flexible tube that is guided down you throat and into your esophagus (the tube leading from your mouth to your stomach) to get a more detailed image of your heart. Once the TEE has determined that a blood clot is not present, the cardioversion begins. Electrical Cardioversion uses a jolt of electricity to your heart either through paddles or wired patches attached to your chest. This is a controlled, usually prescheduled, procedure. This procedure is done at the hospital and you are not awake during the procedure. You usually go home the day of the procedure. Please see the instruction sheet given to you today for more information.    Follow-Up: At [facility], you and your health needs are our priority.  As part of our continuing mission to provide you with exceptional heart care, we have created designated Provider Care Teams.  These Care Teams include your primary Cardiologist (physician) and Advanced Practice Providers (APPs -  Physician Assistants and Nurse Practitioners) who all work  together to provide you with the care you need, when you need it.  We recommend signing up for the patient portal called "MyChart".  Sign up information is provided on this After Visit Summary.  MyChart is used to connect with patients for Virtual Visits (Telemedicine).  Patients are able to view lab/test results, encounter notes, upcoming appointments, etc.  Non-urgent messages can be sent to your provider as well.   To learn more about what you can do with MyChart, go to [email or web address].    Your next appointment:   3 week(s)  The format for your next appointment:   In Person  Provider:   You will follow up in the [facility] located at [facility]. Your provider will be: Roderic Palau, NP    Other Instructions Dear Natasha Glass You are scheduled for a TEE/Cardioversion/TEE Cardioversion on 08/13/22 with Dr. Radford Pax.  Please arrive at the [facility] (Main Entrance A) at [facility]: [address] [place], [place] [address] at 10:00 AM. (1 hour prior to procedure unless lab work is needed; if lab work is needed arrive 1.5 hours ahead)  DIET: Nothing to eat or drink after midnight except a sip of water with medications (see medication instructions below)  FYI: For your safety, and to allow [place] to monitor your vital signs accurately during the surgery/procedure we request that   if you have artificial nails, gel coating, SNS etc. Please have those removed prior to your surgery/procedure. Not having the nail coverings /polish removed may result in cancellation or  delay of your surgery/procedure.   Medication Instructions:   Continue your anticoagulant: Coumadin You will need to continue your anticoagulant after your procedure until you  are told by your  Provider that it is safe to stop   Labs: If patient is on Coumadin, patient needs pt/INR, CBC, BMET within 3 days (No pt/INR needed for patients taking Xarelto, Eliquis, Pradaxa) For patients  receiving anesthesia for TEE and all Cardioversion patients: BMET, CBC within 1 week  Labs completed today  You must have a responsible person to drive you home and stay in the waiting area during your procedure. Failure to do so could result in cancellation.  Bring your insurance cards.  *Special Note: Every effort is made to have your procedure done on time. Occasionally there are emergencies that occur at the hospital that may cause delays. Please be patient if a delay does occur.    Important Information About Sugar

## 2022-08-06 NOTE — Progress Notes (Signed)
Office Visit    Patient Name: Natasha Glass Date of Encounter: 08/06/2022  Primary Care Provider:  Josetta Huddle, MD Primary Cardiologist:  Kirk Ruths, MD  Chief Complaint    81 year old female with a history of paroxysmal atrial fibrillation, chronic diastolic heart failure, hypertension, ascending aortic aneurysm, type 2 diabetes, OSA, DVT/PE, GERD, and gout who presents for follow-up related to heart failure and atrial fibrillation.  Past Medical History    Past Medical History:  Diagnosis Date   Anemia    Arthritis    CHF (congestive heart failure) (HCC)    DVT (deep venous thrombosis) ([place]) 01/2009   Dysrhythmia    GERD (gastroesophageal reflux disease)    Gout    History of blood transfusion    1990's   History of colonic polyps 09/2010   History of pneumonia    Hyperlipidemia    Hypertension    Lactose intolerance    Macular degeneration, dry    bilateral   Migraine    Obesity    Osteopenia    PAF (paroxysmal atrial fibrillation) (HCC)    Peripheral neuropathy    Pulmonary embolism ([place]) 01/2009   Sleep apnea    CPAP pressure 4.0-16.0   Thoracic ascending aortic aneurysm (HCC)    Type II diabetes mellitus ([place])    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)    Urinary incontinence    Past Surgical History:  Procedure Laterality Date   [place] N/A 05/07/2018   Procedure: CARDIOVERSION;  Surgeon: Josue Hector, MD;  Location: [facility] ENDOSCOPY;  Service: Cardiovascular;  Laterality: N/A;   COLONOSCOPY W/ BIOPSIES AND POLYPECTOMY  2005; 2011; 2012   COLONOSCOPY WITH PROPOFOL N/A 09/10/2016   Procedure: COLONOSCOPY WITH PROPOFOL;  Surgeon: Garlan Fair, MD;  Location: [facility] ENDOSCOPY;  Service: Endoscopy;  Laterality: N/A;   JOINT REPLACEMENT     KNEE ARTHROSCOPY Left ~ [place] Left 2011   TOTAL KNEE ARTHROPLASTY  Bilateral 2005-2012   left-right    Allergies  Allergies  Allergen Reactions   Bactrim [Sulfamethoxazole-Trimethoprim]     Drop in BP   Morphine And Related Other (See Comments)    Unresponsive-Very bad reaction.  Can take hydrocodone.    Oysters [Shellfish Allergy] Shortness Of Breath, Itching, Swelling and Rash   Amlodipine Swelling and Other (See Comments)    Of legs   Percocet [Oxycodone-Acetaminophen] Other (See Comments)    hallucinations   Tdap [Tetanus-Diphth-Acell Pertussis] Itching, Swelling and Rash   Tetanus Toxoids Itching, Swelling and Rash   Tramadol Swelling and Other (See Comments)    Of legs   Carbamazepine Other (See Comments)   Lactose Intolerance (Gi)    Macrodantin [Nitrofurantoin] Other (See Comments)   Other Other (See Comments)   Tetanus-Diphtheria Toxoids Td Other (See Comments)   Topiramate Other (See Comments)   Diovan [Valsartan] Other (See Comments)    Cough   Diphtheria Toxoid Itching, Swelling and Rash   Macrodantin [Nitrofurantoin Macrocrystal] Other (See Comments)    unspecified    History of Present Illness    81 year old female with the above past medical history including paroxysmal atrial fibrillation, chronic diastolic heart failure, hypertension, ascending aortic aneurysm, type 2 diabetes, OSA, DVT/PE, GERD, and gout.   She has followed primarily in the A-fib clinic.  Echo in 2018 was normal.  She was initially diagnosed with atrial fibrillation  in 2020 at a follow-up visit with her PCP-she reported a history of not feeling well at the time.  She was started on Xarelto, however, this was later switched to Coumadin. She underwent successful DCCV in 05/2019.  She had recurrent atrial fibrillation in October 2020.  She was loaded with Tikosyn in 08/2019.  She developed recurrent atrial fibrillation with RVR during hospitalization in the fall 2022 in the setting of sepsis due to right leg cellulitis. She also has a history of ascending aortic  aneurysm, monitored by Dr. Cyndia Bent with CT surgery.  She was hospitalized in July 2023 in the setting of COVID-19 fall, and A-fib with RVR.  Repeat echocardiogram showed EF 70 to 75%, no RWMA, indeterminate diastolic parameters.  Metoprolol was increased.  She returned to the ED on 05/28/2022 following a fall.  She was noted to have elevated creatinine at 2.9 as well as a contusion to her left elbow, she was given IV fluids and advised to follow-up with her cardiologist as an outpatient. She was maintaining NSR at the time.  He was last seen in the office on 06/27/2022 and was stable overall from a cardiac standpoint.  She did note an increase in bilateral lower extremity edema.  She was transitioned from Lasix to torsemide 40 mg daily.  She was maintaining NSR.    She presents today for follow-up. Since her last visit she has been stable overall from a cardiac standpoint though the past 2 days she has noted persistently elevated HR and reports feeling fatigued and "jittery."  EKG today shows atrial fibrillation with RVR, 136 bpm.  She denies any chest pain but does notice some tightness in her chest that radiates to her neck associated with rapid heart rate.  She denies dyspnea, PND, orthopnea, weight gain, her leg edema has improved greatly with transition to torsemide.  Other than her recurrent atrial fibrillation and associated symptoms, she denies any additional concerns today.  Home Medications    Current Outpatient Medications  Medication Sig Dispense Refill   acetaminophen (TYLENOL) 500 MG tablet Take 500 mg by mouth at bedtime.     allopurinol (ZYLOPRIM) 300 MG tablet Take 0.5 tablets (150 mg total) by mouth every evening. 15 tablet 0   celecoxib (CELEBREX) 100 MG capsule 1 capsule     Cholecalciferol (VITAMIN D3) 25 MCG (1000 UT) CAPS Take 4,000 Units by mouth daily in the afternoon.     clobetasol cream (TEMOVATE) 5.80 % Apply 1 application topically as needed. 30 g 0    Dextromethorphan-guaiFENesin (MUCINEX DM) 30-600 MG TB12 1 tablet     dofetilide (TIKOSYN) 250 MCG capsule Take 1 capsule (250 mcg total) by mouth 2 (two) times daily. 180 capsule 2   famotidine (PEPCID) 20 MG tablet Take 20 mg by mouth 2 (two) times daily.     HYDROcodone-acetaminophen (NORCO/VICODIN) 5-325 MG tablet Take 1 tablet by mouth every 6 (six) hours as needed for moderate pain.     Lancet Devices (ONETOUCH DELICA PLUS LANCING) MISC      Lancets (ONETOUCH DELICA PLUS [id number]) MISC Apply 1 each topically 3 (three) times daily.     losartan (COZAAR) 25 MG tablet Take 1 tablet (25 mg total) by mouth daily. 30 tablet 0   magnesium oxide (MAG-OX) 400 MG tablet Take 1 tablet (400 mg total) by mouth at bedtime. 30 tablet 0   metFORMIN (GLUCOPHAGE) 500 MG tablet Take 1 tablet (500 mg total) by mouth 2 (two) times daily with a meal. 60  tablet 0   metoprolol succinate (TOPROL XL) 25 MG 24 hr tablet Take 1 tablet (25 mg total) by mouth daily. 90 tablet 3   metoprolol succinate (TOPROL-XL) 50 MG 24 hr tablet Take 1 tablet (50 mg total) by mouth daily. Take with or immediately following a meal. 90 tablet 3   Multiple Vitamins-Minerals (PRESERVISION AREDS 2) CAPS Take 2 capsules by mouth daily.     Naphazoline-Hydroxyprop Mcell 0.03-0.5 % SOLN Place 1 drop into both eyes 2 (two) times daily.     ONETOUCH ULTRA test strip      potassium chloride SA (KLOR-CON M) 20 MEQ tablet Take 1.5 tablets (30 mEq total) by mouth daily. (Patient taking differently: Take 40 mEq by mouth daily.) 45 tablet 0   rosuvastatin (CRESTOR) 5 MG tablet Take 5 mg by mouth daily.     torsemide (DEMADEX) 20 MG tablet Take 1 tablet (20 mg total) by mouth 2 (two) times daily. 180 tablet 3   vitamin B-12 (CYANOCOBALAMIN) 500 MCG tablet Take 500 mcg by mouth daily.     Vitamin K, Phytonadione, 100 MCG TABS Take 10 tablets by mouth daily. For 3 days start on 05/28/22     warfarin (COUMADIN) 5 MG tablet Take 1 tablet (5 mg total) by  mouth daily. 5 mg daily except for Monday take 1/2 tablet 30 tablet 0   No current facility-administered medications for this visit.     Review of Systems    She denies chest pain, palpitations, dyspnea, pnd, orthopnea, n, v, dizziness, syncope, edema, weight gain, or early satiety. All other systems reviewed and are otherwise negative except as noted above.  Physical Exam    VS:  BP 120/74 (BP Location: Right Arm, Patient Position: Sitting, Cuff Size: Large)   Pulse (!) 136   Ht 5' 3"  (1.6 m)   Wt 267 lb (121.1 kg)   SpO2 94%   BMI 47.30 kg/m   GEN: Well nourished, well developed, in no acute distress. HEENT: normal. Neck: Supple, no JVD, carotid bruits, or masses. Cardiac: IRIR, no murmurs, rubs, or gallops. No clubbing, cyanosis, edema.  Radials/DP/PT 2+ and equal bilaterally.  Respiratory:  Respirations regular and unlabored, clear to auscultation bilaterally. GI: Soft, nontender, nondistended, BS + x 4. MS: no deformity or atrophy. Skin: warm and dry, no rash. Neuro:  Strength and sensation are intact. Psych: Normal affect.  Accessory Clinical Findings    ECG personally reviewed by me today -atrial fibrillation, 136 bpm- no acute changes.   Lab Results  Component Value Date   WBC 8.4 05/28/2022   HGB 10.0 (L) 05/28/2022   HCT 31.9 (L) 05/28/2022   MCV 90.9 05/28/2022   PLT 265 05/28/2022   Lab Results  Component Value Date   CREATININE 0.53 (L) 07/08/2022   BUN 21 07/08/2022   NA 139 07/08/2022   K 4.8 07/08/2022   CL 101 07/08/2022   CO2 23 07/08/2022   Lab Results  Component Value Date   ALT 13 05/28/2022   AST 21 05/28/2022   ALKPHOS 68 05/28/2022   BILITOT 0.8 05/28/2022   Lab Results  Component Value Date   CHOL  02/07/2009    136        ATP III CLASSIFICATION:  <200     mg/dL   Desirable  200-239  mg/dL   Borderline High  >=240    mg/dL   High          HDL 45 02/07/2009   LDLCALC  02/07/2009    74        Total Cholesterol/HDL:CHD  Risk Coronary Heart Disease Risk Table                     Men   Women  1/2 Average Risk   3.4   3.3  Average Risk       5.0   4.4  2 X Average Risk   9.6   7.1  3 X Average Risk  23.4   11.0        Use the calculated Patient Ratio above and the CHD Risk Table to determine the patient's CHD Risk.        ATP III CLASSIFICATION (LDL):  <100     mg/dL   Optimal  100-129  mg/dL   Near or Above                    Optimal  130-159  mg/dL   Borderline  160-189  mg/dL   High  >190     mg/dL   Very High   TRIG 84 02/07/2009   CHOLHDL 3.0 02/07/2009    Lab Results  Component Value Date   HGBA1C 6.4 (H) 05/01/2022    Assessment & Plan    1. Persistent atrial fibrillation: EKG today shows atrial fibrillation with RVR, 136 bpm.  She is symptomatic and reports generalized fatigue, weakness, a tightness in her chest that radiates to her neck that that only occurs in the setting of elevated heart rate.  She reports an increased amount of personal stress.  She has not been using her CPAP at home.  Fluid volume status appears improved since her last visit.  Discussed with Dr. Harriet Masson, DOD.  Will d/c metoprolol tartrate 50 mg bid and start metoprolol succinate 75 mg twice daily.  Continue Tikosyn, Coumadin.  Will plan for TEE/DCCV.  This has been scheduled for 08/13/2022 with Dr. Radford Pax. Discussed ED precautions.  Patient verbalized understanding.  Shared Decision Making/Informed Consent The risks [stroke, cardiac arrhythmias rarely resulting in the need for a temporary or permanent pacemaker, skin irritation or burns, esophageal damage, perforation (1:10,000 risk), bleeding, pharyngeal hematoma as well as other potential complications associated with conscious sedation including aspiration, arrhythmia, respiratory failure and death], benefits (treatment guidance, restoration of normal sinus rhythm, diagnostic support) and alternatives of a transesophageal echocardiogram guided cardioversion were discussed  in detail with Ms. Underberg and she is willing to proceed.  2. Chronic diastolic heart failure: Echo in 04/2022 showed EF 70 to 75%, no RWMA, indeterminate diastolic parameters.  She does have chronic bilateral pedal edema, overall, lower extremity edema is much improved since transitioning from furosemide to torsemide.  Weight is down.  She is at risk for acute heart failure exacerbation with recent A-fib with RVR.  Discussed ongoing monitoring with daily weights weights, sodium and fluid recommendations. Continue metoprolol as above, torsemide.  3. Hypertension: BP well controlled. Continue current antihypertensive regimen.    4. Ascending aortic aneurysm: Following with Dr. Cyndia Bent.  Repeat CT scheduled for 07/2022.   5. Type 2 diabetes: A1C was 6.4 in 04/2022.  Monitored and managed per PCP.   6. OSA: She has not used her CPAP since her COVID infection in July 2023.  Encouraged adherence to CPAP.   Marland Kitchen 7. Disposition: Follow-up in 3 weeks in the a fib clinic.      Lenna Sciara, NP 08/06/2022, 1:35 PM

## 2022-08-07 ENCOUNTER — Telehealth: Payer: Self-pay

## 2022-08-07 LAB — CBC
Hematocrit: 40.6 % (ref 34.0–46.6)
Hemoglobin: 12.9 g/dL (ref 11.1–15.9)
MCH: 27.8 pg (ref 26.6–33.0)
MCHC: 31.8 g/dL (ref 31.5–35.7)
MCV: 88 fL (ref 79–97)
Platelets: 193 10*3/uL (ref 150–450)
RBC: 4.64 x10E6/uL (ref 3.77–5.28)
RDW: 14.8 % (ref 11.7–15.4)
WBC: 9.2 10*3/uL (ref 3.4–10.8)

## 2022-08-07 LAB — BASIC METABOLIC PANEL WITH GFR
BUN/Creatinine Ratio: 33 — ABNORMAL HIGH (ref 12–28)
BUN: 27 mg/dL (ref 8–27)
CO2: 22 mmol/L (ref 20–29)
Calcium: 9.8 mg/dL (ref 8.7–10.3)
Chloride: 98 mmol/L (ref 96–106)
Creatinine, Ser: 0.82 mg/dL (ref 0.57–1.00)
Glucose: 138 mg/dL — ABNORMAL HIGH (ref 70–99)
Potassium: 4.1 mmol/L (ref 3.5–5.2)
Sodium: 140 mmol/L (ref 134–144)
eGFR: 72 mL/min/1.73

## 2022-08-07 MED ORDER — METOPROLOL SUCCINATE ER 50 MG PO TB24: 50.0000 mg | ORAL_TABLET | Freq: Every day | ORAL | 0 refills | Status: DC

## 2022-08-07 MED ORDER — METOPROLOL SUCCINATE ER 25 MG PO TB24: 25.0000 mg | ORAL_TABLET | Freq: Every day | ORAL | 0 refills | Status: DC

## 2022-08-07 NOTE — Telephone Encounter (Signed)
Spoke with pt. Pt was notified of results  and recommendations. Pt will proceed TEE/cardioversion.

## 2022-08-08 ENCOUNTER — Telehealth: Payer: Self-pay | Admitting: Cardiology

## 2022-08-08 MED ORDER — METOPROLOL SUCCINATE ER 50 MG PO TB24: 75.0000 mg | ORAL_TABLET | Freq: Two times a day (BID) | ORAL | 3 refills | Status: DC

## 2022-08-08 NOTE — Telephone Encounter (Signed)
Pt c/o medication issue:  1. Name of Medication: metoprolol succinate (TOPROL-XL) 50 MG 24 hr tablet  2. How are you currently taking this medication (dosage and times per day)? Take 1 tablet (50 mg total) by mouth daily. Take with or immediately following a meal  3. Are you having a reaction (difficulty breathing--STAT)? no  4. What is your medication issue? Calling bout her medication, states unsure how she is suppose to take medication. She stated what was told to her and whats on the bottle is different. Please advise

## 2022-08-08 NOTE — Telephone Encounter (Signed)
Spoke with pt. Pt picked her medication up her medication today and will take Metoprolol 75 mg twice daily. Pt is aware that her INR level will be taken at the hospital.

## 2022-08-08 NOTE — Telephone Encounter (Signed)
Per chart review:  Will d/c metoprolol tartrate 50 mg bid and start metoprolol succinate 75 mg twice daily.    What was sent to pharmacy as 2 separate prescriptions for 55m and 539mof metoprolol succinate QD instead of BID  Patient picked up Rx at local pharmacy but will run out in 2 weeks as each Rx was quantity of 30 tabs  She prefers new Rx be sent to [facility]. Rx sent for meto succ 5078mabs - take 1.5 tabs PO BID  She also asked about getting her INR checked pre-procedure. I see an INR was ordered on 10/24 but "held". She would like clarification on this.

## 2022-08-13 ENCOUNTER — Other Ambulatory Visit: Payer: Self-pay

## 2022-08-13 ENCOUNTER — Ambulatory Visit: Payer: Medicare Other | Admitting: *Deleted

## 2022-08-13 ENCOUNTER — Ambulatory Visit (HOSPITAL_BASED_OUTPATIENT_CLINIC_OR_DEPARTMENT_OTHER): Payer: Medicare Other

## 2022-08-13 ENCOUNTER — Ambulatory Visit (HOSPITAL_BASED_OUTPATIENT_CLINIC_OR_DEPARTMENT_OTHER): Payer: Medicare Other | Admitting: Anesthesiology

## 2022-08-13 ENCOUNTER — Ambulatory Visit (HOSPITAL_COMMUNITY)
Admission: RE | Admit: 2022-08-13 | Discharge: 2022-08-13 | Disposition: A | Discharge status: Home / Self Care | Payer: Medicare Other | Attending: Cardiology | Admitting: Cardiology

## 2022-08-13 ENCOUNTER — Encounter (HOSPITAL_COMMUNITY)
Admission: RE | Disposition: A | Discharge status: Home / Self Care | Payer: Self-pay | Source: Home / Self Care | Attending: Cardiology

## 2022-08-13 ENCOUNTER — Encounter (HOSPITAL_COMMUNITY): Payer: Self-pay | Admitting: Cardiology

## 2022-08-13 ENCOUNTER — Ambulatory Visit (HOSPITAL_COMMUNITY): Payer: Medicare Other | Admitting: Anesthesiology

## 2022-08-13 DIAGNOSIS — I4819 Other persistent atrial fibrillation: Secondary | ICD-10-CM | POA: Diagnosis not present

## 2022-08-13 DIAGNOSIS — I700 Atherosclerosis of aorta: Secondary | ICD-10-CM | POA: Diagnosis not present

## 2022-08-13 DIAGNOSIS — M2041 Other hammer toe(s) (acquired), right foot: Secondary | ICD-10-CM | POA: Diagnosis not present

## 2022-08-13 DIAGNOSIS — M2042 Other hammer toe(s) (acquired), left foot: Secondary | ICD-10-CM | POA: Diagnosis not present

## 2022-08-13 DIAGNOSIS — K219 Gastro-esophageal reflux disease without esophagitis: Secondary | ICD-10-CM | POA: Insufficient documentation

## 2022-08-13 DIAGNOSIS — E119 Type 2 diabetes mellitus without complications: Secondary | ICD-10-CM

## 2022-08-13 DIAGNOSIS — I5032 Chronic diastolic (congestive) heart failure: Secondary | ICD-10-CM | POA: Insufficient documentation

## 2022-08-13 DIAGNOSIS — I340 Nonrheumatic mitral (valve) insufficiency: Secondary | ICD-10-CM

## 2022-08-13 DIAGNOSIS — I7121 Aneurysm of the ascending aorta, without rupture: Secondary | ICD-10-CM | POA: Insufficient documentation

## 2022-08-13 DIAGNOSIS — Z79899 Other long term (current) drug therapy: Secondary | ICD-10-CM | POA: Insufficient documentation

## 2022-08-13 DIAGNOSIS — I509 Heart failure, unspecified: Secondary | ICD-10-CM

## 2022-08-13 DIAGNOSIS — M109 Gout, unspecified: Secondary | ICD-10-CM | POA: Diagnosis not present

## 2022-08-13 DIAGNOSIS — I110 Hypertensive heart disease with heart failure: Secondary | ICD-10-CM | POA: Insufficient documentation

## 2022-08-13 DIAGNOSIS — Z7901 Long term (current) use of anticoagulants: Secondary | ICD-10-CM | POA: Insufficient documentation

## 2022-08-13 DIAGNOSIS — I088 Other rheumatic multiple valve diseases: Secondary | ICD-10-CM

## 2022-08-13 DIAGNOSIS — I4891 Unspecified atrial fibrillation: Secondary | ICD-10-CM

## 2022-08-13 DIAGNOSIS — E1142 Type 2 diabetes mellitus with diabetic polyneuropathy: Secondary | ICD-10-CM | POA: Insufficient documentation

## 2022-08-13 DIAGNOSIS — G4730 Sleep apnea, unspecified: Secondary | ICD-10-CM | POA: Diagnosis not present

## 2022-08-13 DIAGNOSIS — G4733 Obstructive sleep apnea (adult) (pediatric): Secondary | ICD-10-CM | POA: Insufficient documentation

## 2022-08-13 DIAGNOSIS — I080 Rheumatic disorders of both mitral and aortic valves: Secondary | ICD-10-CM | POA: Insufficient documentation

## 2022-08-13 DIAGNOSIS — D649 Anemia, unspecified: Secondary | ICD-10-CM | POA: Diagnosis not present

## 2022-08-13 HISTORY — PX: TEE WITHOUT CARDIOVERSION: SHX5443

## 2022-08-13 LAB — PROTIME-INR
INR: 2.6 — ABNORMAL HIGH (ref 0.8–1.2)
Prothrombin Time: 27.5 seconds — ABNORMAL HIGH (ref 11.4–15.2)

## 2022-08-13 SURGERY — ECHOCARDIOGRAM, TRANSESOPHAGEAL
Anesthesia: Monitor Anesthesia Care

## 2022-08-13 MED ORDER — PHENYLEPHRINE 80 MCG/ML (10ML) SYRINGE FOR IV PUSH (FOR BLOOD PRESSURE SUPPORT)
PREFILLED_SYRINGE | INTRAVENOUS | Status: DC | PRN
  Administered 2022-08-13: 80 ug via INTRAVENOUS

## 2022-08-13 MED ORDER — WARFARIN SODIUM 5 MG PO TABS: 2.5000 mg | ORAL_TABLET | ORAL | Status: DC

## 2022-08-13 MED ORDER — PROPOFOL 500 MG/50ML IV EMUL
INTRAVENOUS | Status: DC | PRN
  Administered 2022-08-13: 150 ug/kg/min via INTRAVENOUS

## 2022-08-13 MED ORDER — ONDANSETRON HCL 4 MG/2ML IJ SOLN
INTRAMUSCULAR | Status: DC | PRN
  Administered 2022-08-13: 4 mg via INTRAVENOUS

## 2022-08-13 MED ORDER — SODIUM CHLORIDE 0.9 % IV SOLN: INTRAVENOUS | Status: DC

## 2022-08-13 MED ORDER — POTASSIUM CHLORIDE CRYS ER 20 MEQ PO TBCR: 20.0000 meq | EXTENDED_RELEASE_TABLET | Freq: Every morning | ORAL | Status: AC

## 2022-08-13 MED ORDER — PROPOFOL 10 MG/ML IV BOLUS
INTRAVENOUS | Status: DC | PRN
  Administered 2022-08-13: 20 mg via INTRAVENOUS

## 2022-08-13 MED ORDER — LIDOCAINE 2% (20 MG/ML) 5 ML SYRINGE
INTRAMUSCULAR | Status: DC | PRN
  Administered 2022-08-13: 40 mg via INTRAVENOUS

## 2022-08-13 NOTE — CV Procedure (Signed)
     PROCEDURE NOTE:  Procedure:  Transesophageal echocardiogram Operator:  Fransico Him, MD Indications:  atrial fibrillation with RVR Complications: None  During this procedure the patient is administered a total of Propofol 191 mg to achieve and maintain moderate conscious sedation as well as Lidocaine 52m and Neo 875m.  The patient's heart rate, blood pressure, and oxygen saturation are monitored continuously during the procedure by anesthesia.   Results: Normal LV size and function Normal RV size and function Normal RA Moderately dilated LA  with spontaneous echo contrast in the LA body with no evidence of thrombus in the LA or LAA and LAA emptying velocity of 43cm/sec Normal TV with mild TR Normal PV with mild PR Normal MV with mild MR Tileaflet AV with moderate AV calcification but no AS.  There is mild AI.  Hypermobile interatrial septum with no evidence of shunt by colorflow dopper  Grade 2 atherosclerotic plaque in the ascending aorta.  Cardioversion pads were placed on chest for DCCV but patient spontaneously converted to NSR with PACs and short bursts of atrial tachycardia and DCCV cancelled.  The patient tolerated the procedure well and was transferred back to their room in stable condition   TrFransico Him0/31/2023, 9:07 AM  Signed: TrFransico HimMD CHTyler

## 2022-08-13 NOTE — Anesthesia Preprocedure Evaluation (Signed)
Anesthesia Evaluation  Patient identified by MRN, date of birth, ID band Patient awake    Reviewed: Allergy & Precautions, NPO status , Patient's Chart, lab work & pertinent test results  Airway Mallampati: III  TM Distance: >3 FB Neck ROM: Full    Dental   Pulmonary sleep apnea ,    breath sounds clear to auscultation       Cardiovascular hypertension, Pt. on medications and Pt. on home beta blockers +CHF  + dysrhythmias Atrial Fibrillation  Rhythm:Irregular Rate:Normal     Neuro/Psych  Headaches,    GI/Hepatic Neg liver ROS, PUD, GERD  ,  Endo/Other  diabetes, Type 2  Renal/GU negative Renal ROS     Musculoskeletal  (+) Arthritis ,   Abdominal   Peds  Hematology  (+) Blood dyscrasia, anemia ,   Anesthesia Other Findings   Reproductive/Obstetrics                             Anesthesia Physical Anesthesia Plan  ASA: 3  Anesthesia Plan: MAC   Post-op Pain Management: Minimal or no pain anticipated   Induction:   PONV Risk Score and Plan: 2 and Propofol infusion and Ondansetron  Airway Management Planned: Natural Airway and Nasal Cannula  Additional Equipment:   Intra-op Plan:   Post-operative Plan:   Informed Consent: I have reviewed the patients History and Physical, chart, labs and discussed the procedure including the risks, benefits and alternatives for the proposed anesthesia with the patient or authorized representative who has indicated his/her understanding and acceptance.       Plan Discussed with:   Anesthesia Plan Comments:         Anesthesia Quick Evaluation

## 2022-08-13 NOTE — Anesthesia Procedure Notes (Signed)
Procedure Name: MAC Date/Time: 08/13/2022 10:02 AM  Performed by: Leonor Liv, CRNAPre-anesthesia Checklist: Patient identified, Emergency Drugs available, Suction available, Patient being monitored and Timeout performed Oxygen Delivery Method: Nasal cannula Airway Equipment and Method: Bite block Placement Confirmation: positive ETCO2 Dental Injury: Teeth and Oropharynx as per pre-operative assessment

## 2022-08-13 NOTE — Transfer of Care (Signed)
Immediate Anesthesia Transfer of Care Note  Patient: Kourtlyn Charlet [place]field  Procedure(s) Performed: TRANSESOPHAGEAL ECHOCARDIOGRAM (TEE)  Patient Location: Endoscopy Unit  Anesthesia Type:MAC  Level of Consciousness: awake, alert  and oriented  Airway & Oxygen Therapy: Patient Spontanous Breathing and Patient connected to face mask oxygen  Post-op Assessment: Report given to RN, Post -op Vital signs reviewed and stable and Patient moving all extremities  Post vital signs: Reviewed and stable  Last Vitals:  Vitals Value Taken Time  BP 123/69 08/13/22 1033  Temp 36.4 C 08/13/22 1033  Pulse 84 08/13/22 1038  Resp 19 08/13/22 1038  SpO2 91 % 08/13/22 1038  Vitals shown include unvalidated device data.  Last Pain:  Vitals:   08/13/22 1033  TempSrc: Temporal  PainSc: 0-No pain         Complications: No notable events documented.

## 2022-08-13 NOTE — Progress Notes (Signed)
  Echocardiogram Echocardiogram Transesophageal has been performed.  Natasha Glass 08/13/2022, 10:42 AM

## 2022-08-13 NOTE — Interval H&P Note (Signed)
History and Physical Interval Note:  08/13/2022 9:03 AM  Natasha Glass  has presented today for surgery, with the diagnosis of AFIB.  The various methods of treatment have been discussed with the patient and family. After consideration of risks, benefits and other options for treatment, the patient has consented to  Procedure(s): TRANSESOPHAGEAL ECHOCARDIOGRAM (TEE) (N/A) CARDIOVERSION (N/A) as a surgical intervention.  The patient's history has been reviewed, patient examined, no change in status, stable for surgery.  I have reviewed the patient's chart and labs.  Questions were answered to the patient's satisfaction.     Fransico Him

## 2022-08-13 NOTE — Anesthesia Postprocedure Evaluation (Signed)
Anesthesia Post Note  Patient: Natasha Glass  Procedure(s) Performed: TRANSESOPHAGEAL ECHOCARDIOGRAM (TEE)     Patient location during evaluation: PACU Anesthesia Type: MAC Level of consciousness: awake and alert Pain management: pain level controlled Vital Signs Assessment: post-procedure vital signs reviewed and stable Respiratory status: spontaneous breathing, nonlabored ventilation, respiratory function stable and patient connected to nasal cannula oxygen Cardiovascular status: stable and blood pressure returned to baseline Postop Assessment: no apparent nausea or vomiting Anesthetic complications: no   No notable events documented.  Last Vitals:  Vitals:   08/13/22 1050 08/13/22 1100  BP: (!) 126/106 122/84  Pulse: 69 82  Resp: (!) 22 18  Temp:    SpO2: 98% 94%    Last Pain:  Vitals:   08/13/22 1050  TempSrc:   PainSc: 0-No pain                 Tiajuana Amass

## 2022-08-13 NOTE — Progress Notes (Signed)
Patient presents today to pick up diabetic shoes and insoles.  Patient was dispensed 1 pair of diabetic shoes and 3 pairs of foam casted diabetic insoles. Fit was satisfactory. Instructions for break-in and wear was reviewed and a copy was given to the patient.   Re-appointment for regularly scheduled diabetic foot care visits or if they should experience any trouble with the shoes or insoles.  

## 2022-08-14 ENCOUNTER — Encounter (HOSPITAL_COMMUNITY): Payer: Self-pay | Admitting: Cardiology

## 2022-08-21 ENCOUNTER — Telehealth: Payer: Self-pay | Admitting: Cardiology

## 2022-08-21 ENCOUNTER — Other Ambulatory Visit: Payer: Self-pay | Admitting: Nurse Practitioner

## 2022-08-21 MED ORDER — METOPROLOL SUCCINATE ER 50 MG PO TB24: 50.0000 mg | ORAL_TABLET | Freq: Two times a day (BID) | ORAL | 2 refills | Status: DC

## 2022-08-21 MED ORDER — METOPROLOL SUCCINATE ER 25 MG PO TB24: 25.0000 mg | ORAL_TABLET | Freq: Two times a day (BID) | ORAL | 2 refills | Status: DC

## 2022-08-21 NOTE — Telephone Encounter (Signed)
Pt c/o medication issue:  1. Name of Medication:  metoprolol succinate (TOPROL-XL) 50 MG 24 hr tablet  metoprolol succinate (TOPROL-XL) 25 MG 24 hr tablet   2. How are you currently taking this medication (dosage and times per day)? 2x daily  3. Are you having a reaction (difficulty breathing--STAT)? no  4. What is your medication issue? Patient states we need to call [facility] RE[facility]# [id number] and left them know she takes it 2x a day, they only sent her enough pills for once daily.  She takes a totally of 34m 2x's a day.

## 2022-08-21 NOTE — Telephone Encounter (Signed)
Called patient, advised that she takes Metoprolol Succinate 50 mg and 25 mg (this is so she does not have to cut it in half) to equal 75 mg twice daily.   I contacted pharmacy, advised of update to the prescriptions.   Will update medication list.   Thanks!

## 2022-08-27 ENCOUNTER — Ambulatory Visit (HOSPITAL_COMMUNITY)
Admission: RE | Admit: 2022-08-27 | Discharge: 2022-08-27 | Disposition: A | Discharge status: Home / Self Care | Payer: Medicare Other | Source: Ambulatory Visit | Attending: Nurse Practitioner | Admitting: Nurse Practitioner

## 2022-08-27 VITALS — BP 128/76 | HR 56 | Ht 63.0 in | Wt 264.6 lb

## 2022-08-27 DIAGNOSIS — G4733 Obstructive sleep apnea (adult) (pediatric): Secondary | ICD-10-CM | POA: Insufficient documentation

## 2022-08-27 DIAGNOSIS — E1142 Type 2 diabetes mellitus with diabetic polyneuropathy: Secondary | ICD-10-CM | POA: Insufficient documentation

## 2022-08-27 DIAGNOSIS — I480 Paroxysmal atrial fibrillation: Secondary | ICD-10-CM

## 2022-08-27 DIAGNOSIS — Z6841 Body Mass Index (BMI) 40.0 and over, adult: Secondary | ICD-10-CM | POA: Diagnosis not present

## 2022-08-27 DIAGNOSIS — E669 Obesity, unspecified: Secondary | ICD-10-CM | POA: Insufficient documentation

## 2022-08-27 DIAGNOSIS — I4891 Unspecified atrial fibrillation: Secondary | ICD-10-CM | POA: Diagnosis not present

## 2022-08-27 DIAGNOSIS — I7121 Aneurysm of the ascending aorta, without rupture: Secondary | ICD-10-CM | POA: Diagnosis not present

## 2022-08-27 DIAGNOSIS — Z7901 Long term (current) use of anticoagulants: Secondary | ICD-10-CM | POA: Diagnosis not present

## 2022-08-27 DIAGNOSIS — I110 Hypertensive heart disease with heart failure: Secondary | ICD-10-CM | POA: Diagnosis not present

## 2022-08-27 DIAGNOSIS — M1990 Unspecified osteoarthritis, unspecified site: Secondary | ICD-10-CM | POA: Insufficient documentation

## 2022-08-27 DIAGNOSIS — I82409 Acute embolism and thrombosis of unspecified deep veins of unspecified lower extremity: Secondary | ICD-10-CM | POA: Insufficient documentation

## 2022-08-27 DIAGNOSIS — K219 Gastro-esophageal reflux disease without esophagitis: Secondary | ICD-10-CM | POA: Diagnosis not present

## 2022-08-27 DIAGNOSIS — D6869 Other thrombophilia: Secondary | ICD-10-CM | POA: Diagnosis not present

## 2022-08-27 DIAGNOSIS — I4819 Other persistent atrial fibrillation: Secondary | ICD-10-CM | POA: Diagnosis not present

## 2022-08-27 DIAGNOSIS — I5032 Chronic diastolic (congestive) heart failure: Secondary | ICD-10-CM | POA: Diagnosis not present

## 2022-08-27 LAB — MAGNESIUM: Magnesium: 2 mg/dL (ref 1.7–2.4)
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
## 2022-08-28 ENCOUNTER — Encounter (INDEPENDENT_AMBULATORY_CARE_PROVIDER_SITE_OTHER): Payer: Medicare Other | Admitting: Ophthalmology

## 2022-08-28 DIAGNOSIS — H35033 Hypertensive retinopathy, bilateral: Secondary | ICD-10-CM | POA: Diagnosis not present

## 2022-08-28 DIAGNOSIS — H353221 Exudative age-related macular degeneration, left eye, with active choroidal neovascularization: Secondary | ICD-10-CM

## 2022-08-28 DIAGNOSIS — H353112 Nonexudative age-related macular degeneration, right eye, intermediate dry stage: Secondary | ICD-10-CM | POA: Diagnosis not present

## 2022-08-28 DIAGNOSIS — I10 Essential (primary) hypertension: Secondary | ICD-10-CM | POA: Diagnosis not present

## 2022-08-28 DIAGNOSIS — H43813 Vitreous degeneration, bilateral: Secondary | ICD-10-CM | POA: Diagnosis not present

## 2022-09-11 ENCOUNTER — Ambulatory Visit (INDEPENDENT_AMBULATORY_CARE_PROVIDER_SITE_OTHER): Payer: Medicare Other

## 2022-09-11 DIAGNOSIS — E1142 Type 2 diabetes mellitus with diabetic polyneuropathy: Secondary | ICD-10-CM

## 2022-09-11 NOTE — Progress Notes (Signed)
Patient presents to the office today with issues concerning the diabetic shoes picked up on 08/13/22.   The shoes feel too tight.  We will send the Apex brand, style [id number], size 10.5 x-wide back.   Reorder: Apex [id number] 11 xx-wide   Patient kept insoles for other shoes. Advised to bring back a pair to check fit of reorder.  Patient will be notified for a fitting appointment once the shoes arrive in office.  . Patient did not have the Apex inserts with her today. Patient will bring in the shoes, box, and inserts that came with the shoes next week. Advised patient to ask for Christan when she get s here to verify that we have everything needed to exchange the shoe. Patient verbalized understanding.

## 2022-09-18 DIAGNOSIS — I10 Essential (primary) hypertension: Secondary | ICD-10-CM | POA: Diagnosis not present

## 2022-09-18 DIAGNOSIS — D6869 Other thrombophilia: Secondary | ICD-10-CM | POA: Diagnosis not present

## 2022-09-18 DIAGNOSIS — I872 Venous insufficiency (chronic) (peripheral): Secondary | ICD-10-CM | POA: Diagnosis not present

## 2022-09-18 DIAGNOSIS — E1165 Type 2 diabetes mellitus with hyperglycemia: Secondary | ICD-10-CM | POA: Diagnosis not present

## 2022-09-18 DIAGNOSIS — I4891 Unspecified atrial fibrillation: Secondary | ICD-10-CM | POA: Diagnosis not present

## 2022-09-18 DIAGNOSIS — I712 Thoracic aortic aneurysm, without rupture, unspecified: Secondary | ICD-10-CM | POA: Diagnosis not present

## 2022-09-18 DIAGNOSIS — E782 Mixed hyperlipidemia: Secondary | ICD-10-CM | POA: Diagnosis not present

## 2022-09-18 DIAGNOSIS — M109 Gout, unspecified: Secondary | ICD-10-CM | POA: Diagnosis not present

## 2022-09-19 NOTE — Progress Notes (Unsigned)
[address]       [place] [address]             [phone]    PRIYANKA CAUSEY [id number] [birth year]  History of Present Illness:  Natasha Glass is an 81 yo obese female with history of DM II, OSA with CPAP, Arthritis, DVT, Gerd, Obesity, anemia, HTN, Atrial Fibrillation, and Ascending Aortic Aneurysm.  She presents for 1 year follow up.  The patient has had a rough year.  She has required hospitalization to shock for Atrial Fibrillation.  She converted without getting shocked.  She denies chest pain and shortness of breath.  She had COVID this summer that required hospitalization for hers despite vaccination.  She is here using a walker today.  The patient is overall not active.  Her children help her out in the day to day.  Current Outpatient Medications on File Prior to Visit  Medication Sig Dispense Refill   acetaminophen (TYLENOL) 500 MG tablet Take 500-1,000 mg by mouth every 6 (six) hours as needed (pain/sleep).     allopurinol (ZYLOPRIM) 300 MG tablet Take 0.5 tablets (150 mg total) by mouth every evening. 15 tablet 0   Cholecalciferol (VITAMIN D3) 25 MCG (1000 UT) CAPS Take 2,000 Units by mouth in the morning.     clobetasol cream (TEMOVATE) 4.66 % Apply 1 application topically as needed. (Patient taking differently: Apply 1 application  topically daily as needed (skin irritation (legs)).) 30 g 0   dofetilide (TIKOSYN) 250 MCG capsule Take 1 capsule (250 mcg total) by mouth 2 (two) times daily. 180 capsule 2   HYDROcodone-acetaminophen (NORCO/VICODIN) 5-325 MG tablet Take 0.5 tablets by mouth daily as needed (pain.).     Lancet Devices (ONETOUCH DELICA PLUS LANCING) MISC      Lancets (ONETOUCH DELICA PLUS [id number]) MISC Apply 1 each topically 3 (three) times daily.     losartan (COZAAR) 25 MG tablet Take 1 tablet (25 mg total) by mouth daily. 30 tablet 0   magnesium oxide (MAG-OX) 400 MG tablet Take 1 tablet (400 mg total) by mouth at bedtime. 30 tablet 0    metFORMIN (GLUCOPHAGE) 500 MG tablet Take 1 tablet (500 mg total) by mouth 2 (two) times daily with a meal. 60 tablet 0   metoprolol succinate (TOPROL XL) 25 MG 24 hr tablet Take 1 tablet (25 mg total) by mouth in the morning and at bedtime. = 75 mg (take with 50 mg tablet) 90 tablet 2   metoprolol succinate (TOPROL-XL) 50 MG 24 hr tablet Take 1 tablet (50 mg total) by mouth in the morning and at bedtime. = 75 mg (take with 25 mg tablet) 180 tablet 2   Multiple Vitamins-Minerals (PRESERVISION AREDS 2) CAPS Take 2 capsules by mouth daily with lunch.     Naphazoline-Glycerin (CLEAR EYES MAX REDNESS RELIEF) 0.03-0.5 % SOLN Place 1 drop into both eyes 2 (two) times daily as needed (dry/irritated eyes.).     ONETOUCH ULTRA test strip      potassium chloride SA (KLOR-CON M) 20 MEQ tablet Take 1 tablet (20 mEq total) by mouth in the morning.     rosuvastatin (CRESTOR) 5 MG tablet Take 5 mg by mouth in the morning.     torsemide (DEMADEX) 20 MG tablet Take 1 tablet (20 mg total) by mouth 2 (two) times daily. 180 tablet 3   vitamin B-12 (CYANOCOBALAMIN) 500 MCG tablet Take 500 mcg by mouth in the morning.  warfarin (COUMADIN) 5 MG tablet Take 0.5-1 tablets (2.5-5 mg total) by mouth See admin instructions. Take 0.5 tablet (2.5 mg) by mouth at supper on Mondays, Wednesdays & Fridays. Take 1 tablet (5 mg) by mouth at supper on Tuesdays, Thursdays, Saturdays & Sundays.     No current facility-administered medications on file prior to visit.     BP 131/88 (BP Location: Left Arm, Patient Position: Sitting)   Pulse (!) 57   Resp 20   Ht 5' 3"  (1.6 m)   Wt 267 lb (121.1 kg)   SpO2 94% Comment: RA  BMI 47.30 kg/m   Physical Exam  Gen: NAD, morbidly obese Heart: RRR Lungs: CTA Bilaterally Carotid: No Bruit Abd: soft non-tender, non-distended Ext: no edema Neuro: grossly intact  CTA Results:  1. 4.5 cm ascending thoracic aortic aneurysm, with no evidence of dissection. Ascending thoracic aortic  aneurysm. Recommend semi-annual imaging followup by CTA or MRA and referral to cardiothoracic surgery if not already obtained. This recommendation follows 2010 ACCF/AHA/AATS/ACR/ASA/SCA/SCAI/SIR/STS/SVM Guidelines for the Diagnosis and Management of Patients With Thoracic Aortic Disease. Circulation. 2010; 121: T419-Q222. Aortic aneurysm NOS (ICD10-I71.9) 2. No acute intrathoracic process. 3. Aortic Atherosclerosis (ICD10-I70.0). Coronary artery atherosclerosis. 4. 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 Given stability for greater than 1 year, no further follow-up imaging is recommended. JACR 2017 Aug; 14(8):1038-44, JCAT 2016 Mar-Apr; 40(2):194-200, Urol J 2006 Spring; 3(2):71-4.     Electronically Signed   By: Randa Ngo M.D.   On: 09/20/2022 14:42P:  Ascending Aortic Aneurysm Atrial Fibrillation- per Cardiology HTN- well controlled DM RTC in 1 year with CTA chest   Risk Modification:  Patient was counseled on importance of Blood Pressure Control.  Despite Medical intervention if the patient notices persistently elevated blood pressure readings.  They are instructed to contact their Primary Care Physician  Please avoid use of Fluoroquinolones as this can potentially increase your risk of Aortic Rupture and/or Dissection  Patient educated on signs and symptoms of Aortic Dissection, handout also provided in AVS  Chella Chapdelaine, PA-C 09/23/22

## 2022-09-19 NOTE — Patient Instructions (Signed)
AVOID FLUOROQUINOLONES ( ex: Cipro) AS THEY CAN INCREASE YOUR RISK OF AORTIC DISSECTION  Patient is counseled regarding the importance of long term risk factor modification as they pertain to the presence of ischemic heart disease including avoiding the use of all tobacco products, dietary modifications and medical therapy for diabetes, cholesterol and lipid management, and regular exercise.

## 2022-09-20 ENCOUNTER — Ambulatory Visit
Admission: RE | Admit: 2022-09-20 | Discharge: 2022-09-20 | Disposition: A | Discharge status: Home / Self Care | Payer: Medicare Other | Source: Ambulatory Visit | Attending: Surgery | Admitting: Surgery

## 2022-09-20 DIAGNOSIS — I712 Thoracic aortic aneurysm, without rupture, unspecified: Secondary | ICD-10-CM

## 2022-09-20 DIAGNOSIS — J984 Other disorders of lung: Secondary | ICD-10-CM | POA: Diagnosis not present

## 2022-09-20 DIAGNOSIS — I2510 Atherosclerotic heart disease of native coronary artery without angina pectoris: Secondary | ICD-10-CM | POA: Diagnosis not present

## 2022-09-20 DIAGNOSIS — I700 Atherosclerosis of aorta: Secondary | ICD-10-CM | POA: Diagnosis not present

## 2022-09-20 MED ORDER — IOPAMIDOL (ISOVUE-370) INJECTION 76%
75.0000 mL | Freq: Once | INTRAVENOUS | Status: AC | PRN
  Administered 2022-09-20: 75 mL via INTRAVENOUS

## 2022-09-23 ENCOUNTER — Ambulatory Visit: Payer: Medicare Other | Admitting: Physician Assistant

## 2022-09-23 VITALS — BP 131/88 | HR 57 | Resp 20 | Ht 63.0 in | Wt 267.0 lb

## 2022-09-23 DIAGNOSIS — I712 Thoracic aortic aneurysm, without rupture, unspecified: Secondary | ICD-10-CM

## 2022-09-25 ENCOUNTER — Encounter (INDEPENDENT_AMBULATORY_CARE_PROVIDER_SITE_OTHER): Payer: Medicare Other | Admitting: Ophthalmology

## 2022-09-25 DIAGNOSIS — H353112 Nonexudative age-related macular degeneration, right eye, intermediate dry stage: Secondary | ICD-10-CM

## 2022-09-25 DIAGNOSIS — H353221 Exudative age-related macular degeneration, left eye, with active choroidal neovascularization: Secondary | ICD-10-CM

## 2022-09-25 DIAGNOSIS — H43813 Vitreous degeneration, bilateral: Secondary | ICD-10-CM | POA: Diagnosis not present

## 2022-09-25 DIAGNOSIS — H35033 Hypertensive retinopathy, bilateral: Secondary | ICD-10-CM

## 2022-09-25 DIAGNOSIS — I10 Essential (primary) hypertension: Secondary | ICD-10-CM

## 2022-10-02 ENCOUNTER — Ambulatory Visit: Payer: Medicare Other | Admitting: *Deleted

## 2022-10-02 DIAGNOSIS — E1142 Type 2 diabetes mellitus with diabetic polyneuropathy: Secondary | ICD-10-CM

## 2022-10-02 DIAGNOSIS — M2042 Other hammer toe(s) (acquired), left foot: Secondary | ICD-10-CM

## 2022-10-02 DIAGNOSIS — M2041 Other hammer toe(s) (acquired), right foot: Secondary | ICD-10-CM | POA: Diagnosis not present

## 2022-10-02 NOTE — Progress Notes (Signed)
Patient presents today to pick up diabetic shoes and insoles.  Patient was dispensed 1 pair of diabetic shoes . She tried on the shoes with the insoles and the fit was satisfactory.    Will follow up with Dr Blenda Mounts next year for new order.

## 2022-10-23 ENCOUNTER — Encounter (INDEPENDENT_AMBULATORY_CARE_PROVIDER_SITE_OTHER): Payer: Medicare Other | Admitting: Ophthalmology

## 2022-11-07 ENCOUNTER — Encounter (INDEPENDENT_AMBULATORY_CARE_PROVIDER_SITE_OTHER): Payer: Medicare Other | Admitting: Ophthalmology

## 2022-11-07 DIAGNOSIS — H353112 Nonexudative age-related macular degeneration, right eye, intermediate dry stage: Secondary | ICD-10-CM | POA: Diagnosis not present

## 2022-11-07 DIAGNOSIS — H353221 Exudative age-related macular degeneration, left eye, with active choroidal neovascularization: Secondary | ICD-10-CM | POA: Diagnosis not present

## 2022-11-07 DIAGNOSIS — I10 Essential (primary) hypertension: Secondary | ICD-10-CM

## 2022-11-07 DIAGNOSIS — H43813 Vitreous degeneration, bilateral: Secondary | ICD-10-CM

## 2022-11-07 DIAGNOSIS — H35033 Hypertensive retinopathy, bilateral: Secondary | ICD-10-CM | POA: Diagnosis not present

## 2022-11-13 DIAGNOSIS — E782 Mixed hyperlipidemia: Secondary | ICD-10-CM | POA: Diagnosis not present

## 2022-11-13 DIAGNOSIS — I10 Essential (primary) hypertension: Secondary | ICD-10-CM | POA: Diagnosis not present

## 2022-11-13 DIAGNOSIS — M810 Age-related osteoporosis without current pathological fracture: Secondary | ICD-10-CM | POA: Diagnosis not present

## 2022-11-13 DIAGNOSIS — E1165 Type 2 diabetes mellitus with hyperglycemia: Secondary | ICD-10-CM | POA: Diagnosis not present

## 2022-11-13 DIAGNOSIS — I4891 Unspecified atrial fibrillation: Secondary | ICD-10-CM | POA: Diagnosis not present

## 2022-11-21 ENCOUNTER — Encounter (HOSPITAL_COMMUNITY): Payer: Self-pay | Admitting: *Deleted

## 2022-11-28 ENCOUNTER — Ambulatory Visit: Payer: Medicare Other | Attending: Nurse Practitioner | Admitting: Nurse Practitioner

## 2022-11-28 ENCOUNTER — Encounter: Payer: Self-pay | Admitting: Nurse Practitioner

## 2022-11-28 VITALS — BP 142/68 | HR 59 | Ht 63.0 in | Wt 273.4 lb

## 2022-11-28 DIAGNOSIS — I7121 Aneurysm of the ascending aorta, without rupture: Secondary | ICD-10-CM | POA: Diagnosis not present

## 2022-11-28 DIAGNOSIS — I4819 Other persistent atrial fibrillation: Secondary | ICD-10-CM

## 2022-11-28 DIAGNOSIS — G4733 Obstructive sleep apnea (adult) (pediatric): Secondary | ICD-10-CM

## 2022-11-28 DIAGNOSIS — E119 Type 2 diabetes mellitus without complications: Secondary | ICD-10-CM | POA: Diagnosis not present

## 2022-11-28 DIAGNOSIS — I5032 Chronic diastolic (congestive) heart failure: Secondary | ICD-10-CM | POA: Diagnosis not present

## 2022-11-28 DIAGNOSIS — I10 Essential (primary) hypertension: Secondary | ICD-10-CM | POA: Diagnosis not present

## 2022-11-28 NOTE — Patient Instructions (Signed)
Medication Instructions:  Your physician recommends that you continue on your current medications as directed. Please refer to the Current Medication list given to you today.   *If you need a refill on your cardiac medications before your next appointment, please call your pharmacy*   Lab Work: NONE ordered at this time of appointment   If you have labs (blood work) drawn today and your tests are completely normal, you will receive your results only by: [facility] (if you have MyChart) OR A paper copy in the mail If you have any lab test that is abnormal or we need to change your treatment, we will call you to review the results.   Testing/Procedures: NONE ordered at this time of appointment     Follow-Up: At [facility], you and your health needs are our priority.  As part of our continuing mission to provide you with exceptional heart care, we have created designated Provider Care Teams.  These Care Teams include your primary Cardiologist (physician) and Advanced Practice Providers (APPs -  Physician Assistants and Nurse Practitioners) who all work together to provide you with the care you need, when you need it.  We recommend signing up for the patient portal called "MyChart".  Sign up information is provided on this After Visit Summary.  MyChart is used to connect with patients for Virtual Visits (Telemedicine).  Patients are able to view lab/test results, encounter notes, upcoming appointments, etc.  Non-urgent messages can be sent to your provider as well.   To learn more about what you can do with MyChart, go to [email or web address].    Your next appointment:   3 month(s) (Afib Clinic) 6 Months  (Dr. Stanford Breed)  Provider:   Kirk Ruths, MD     Other Instructions

## 2022-11-28 NOTE — Progress Notes (Signed)
Office Visit    Patient Name: Natasha Glass Date of Encounter: 11/28/2022  Primary Care Provider:  [facility] Primary Cardiologist:  Kirk Ruths, MD  Chief Complaint    82 year old female with a history of paroxysmal atrial fibrillation, chronic diastolic heart failure, hypertension, ascending aortic aneurysm, type 2 diabetes, OSA, DVT/PE, GERD, and gout who presents for follow-up related to heart failure and atrial fibrillation.   Past Medical History    Past Medical History:  Diagnosis Date   Anemia    Arthritis    CHF (congestive heart failure) (HCC)    DVT (deep venous thrombosis) ([place]) 01/2009   Dysrhythmia    GERD (gastroesophageal reflux disease)    Gout    History of blood transfusion    1990's   History of colonic polyps 09/2010   History of pneumonia    Hyperlipidemia    Hypertension    Lactose intolerance    Macular degeneration, dry    bilateral   Migraine    Obesity    Osteopenia    PAF (paroxysmal atrial fibrillation) (HCC)    Peripheral neuropathy    Pulmonary embolism ([place]) 01/2009   Sleep apnea    CPAP pressure 4.0-16.0   Thoracic ascending aortic aneurysm (HCC)    Type II diabetes mellitus ([place])    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)    Urinary incontinence    Past Surgical History:  Procedure Laterality Date   [place] N/A 05/07/2018   Procedure: CARDIOVERSION;  Surgeon: Josue Hector, MD;  Location: [facility] ENDOSCOPY;  Service: Cardiovascular;  Laterality: N/A;   COLONOSCOPY W/ BIOPSIES AND POLYPECTOMY  2005; 2011; 2012   COLONOSCOPY WITH PROPOFOL N/A 09/10/2016   Procedure: COLONOSCOPY WITH PROPOFOL;  Surgeon: Garlan Fair, MD;  Location: [facility] ENDOSCOPY;  Service: Endoscopy;  Laterality: N/A;   JOINT REPLACEMENT     KNEE ARTHROSCOPY Left ~ [place] Left 2011   TEE  WITHOUT CARDIOVERSION N/A 08/13/2022   Procedure: TRANSESOPHAGEAL ECHOCARDIOGRAM (TEE);  Surgeon: Sueanne Margarita, MD;  Location: [facility] ENDOSCOPY;  Service: Cardiovascular;  Laterality: N/A;   TOTAL KNEE ARTHROPLASTY Bilateral 2005-2012   left-right    Allergies  Allergies  Allergen Reactions   Bactrim [Sulfamethoxazole-Trimethoprim]     Drop in BP   Morphine And Related Other (See Comments)    Unresponsive-Very bad reaction.  Can take hydrocodone.    Oysters [Shellfish Allergy] Shortness Of Breath, Itching, Swelling and Rash   Amlodipine Swelling and Other (See Comments)    Of legs   Percocet [Oxycodone-Acetaminophen] Other (See Comments)    hallucinations   Tdap [Tetanus-Diphth-Acell Pertussis] Itching, Swelling and Rash   Tetanus Toxoids Itching, Swelling and Rash   Tramadol Swelling and Other (See Comments)    Of legs   Lactose Intolerance (Gi) Other (See Comments)   Other Other (See Comments)   Tegretol [Carbamazepine] Other (See Comments)    unknown   Tetanus-Diphtheria Toxoids Td Other (See Comments)   Topiramate Other (See Comments)   Diovan [Valsartan] Other (See Comments)    Cough   Diphtheria Toxoid Itching, Swelling and Rash   Macrodantin [Nitrofurantoin Macrocrystal] Other (See Comments)    unspecified     Labs/Other Studies Reviewed    The following studies were reviewed today: Echo 04/2022: IMPRESSIONS    1. Left ventricular ejection fraction, by estimation, is  70 to 75%. The  left ventricle has hyperdynamic function. The left ventricle has no  regional wall motion abnormalities. Left ventricular diastolic parameters  are indeterminate.   2. Right ventricular systolic function is normal. The right ventricular  size is normal. Tricuspid regurgitation signal is inadequate for assessing  PA pressure.   3. Left atrial size was mildly dilated.   4. The mitral valve is abnormal. Trivial mitral valve regurgitation. No  evidence of mitral stenosis. Severe mitral  annular calcification.   5. The aortic valve is tricuspid. There is mild calcification of the  aortic valve. There is mild thickening of the aortic valve. Aortic valve  regurgitation is mild. Aortic valve sclerosis/calcification is present,  without any evidence of aortic  stenosis.   6. Aortic dilatation noted. There is moderate dilatation of the ascending  aorta, measuring 46 mm.   7. The inferior vena cava is dilated in size with >50% respiratory  variability, suggesting right atrial pressure of 8 mmHg.   Comparison(s): No significant change from prior study.   Recent Labs: 05/01/2022: TSH 2.121 05/28/2022: ALT 13 06/27/2022: BNP 68.6 08/06/2022: BUN 27; Creatinine, Ser 0.82; Hemoglobin 12.9; Platelets 193; Potassium 4.1; Sodium 140 08/27/2022: Magnesium 2.0  Recent Lipid Panel    Component Value Date/Time   CHOL  02/07/2009 0418    136        ATP III CLASSIFICATION:  <200     mg/dL   Desirable  200-239  mg/dL   Borderline High  >=240    mg/dL   High          TRIG 84 02/07/2009 0418   HDL 45 02/07/2009 0418   CHOLHDL 3.0 02/07/2009 0418   VLDL 17 02/07/2009 0418   LDLCALC  02/07/2009 0418    74        Total Cholesterol/HDL:CHD Risk Coronary Heart Disease Risk Table                     Men   Women  1/2 Average Risk   3.4   3.3  Average Risk       5.0   4.4  2 X Average Risk   9.6   7.1  3 X Average Risk  23.4   11.0        Use the calculated Patient Ratio above and the CHD Risk Table to determine the patient's CHD Risk.        ATP III CLASSIFICATION (LDL):  <100     mg/dL   Optimal  100-129  mg/dL   Near or Above                    Optimal  130-159  mg/dL   Borderline  160-189  mg/dL   High  >190     mg/dL   Very High    History of Present Illness    82 year old female with the above past medical history including paroxysmal atrial fibrillation, chronic diastolic heart failure, hypertension, ascending aortic aneurysm, type 2 diabetes, OSA, DVT/PE, GERD, and  gout.   She has followed primarily in the A-fib clinic.  Echo in 2018 was normal.  She was initially diagnosed with atrial fibrillation in 2020 at a follow-up visit with her PCP-she reported a history of not feeling well at the time.  She was started on Xarelto, however, this was later switched to Coumadin. She underwent successful DCCV in 05/2019.  She had recurrent atrial fibrillation in October 2020.  She was loaded with Tikosyn in 08/2019.  She developed recurrent atrial fibrillation with RVR during hospitalization in the fall 2022 in the setting of sepsis due to right leg cellulitis. She also has a history of ascending aortic aneurysm, monitored by Dr. Cyndia Bent with CT surgery.  She was hospitalized in July 2023 in the setting of COVID-19 fall, and A-fib with RVR.  Repeat echocardiogram showed EF 70 to 75%, no RWMA, indeterminate diastolic parameters.  Metoprolol was increased.  She returned to the ED on 05/28/2022 following a fall.  She was noted to have elevated creatinine at 2.9 as well as a contusion to her left elbow, she was given IV fluids and advised to follow-up with her cardiologist as an outpatient. She was maintaining NSR at the time.  At a follow-up visit in 06/2022 she noted increased bilateral lower extremity edema.  She was transitioned from Lasix to torsemide 40 mg daily.  She was maintaining NSR at the time.  At her follow-up visit in 07/2022 she was noted to have recurrent atrial fibrillation with RVR.  She was referred for TEE/DCCV (pt is on coumadin), however, procedure was canceled as patient spontaneously converted to NSR with PACs.  She was last seen in the A-fib clinic on 08/27/2022 and was stable from a cardiac standpoint.  She was maintaining sinus rhythm.   She presents today for follow-up. Since her last visit she has been stable from a cardiac standpoint.  She notes 1 brief breakthrough episode of atrial fibrillations since 08/2022.  She has had mild intermittent dizziness, denies  presyncope, syncope.  Overall, she reports feeling well.  Home Medications    Current Outpatient Medications  Medication Sig Dispense Refill   acetaminophen (TYLENOL) 500 MG tablet Take 500-1,000 mg by mouth every 6 (six) hours as needed (pain/sleep).     allopurinol (ZYLOPRIM) 300 MG tablet Take 0.5 tablets (150 mg total) by mouth every evening. 15 tablet 0   Cholecalciferol (VITAMIN D3) 25 MCG (1000 UT) CAPS Take 2,000 Units by mouth in the morning.     clobetasol cream (TEMOVATE) [id number] % Apply 1 application topically as needed. (Patient taking differently: Apply 1 application  topically daily as needed (skin irritation (legs)).) 30 g 0   dofetilide (TIKOSYN) 250 MCG capsule Take 1 capsule (250 mcg total) by mouth 2 (two) times daily. 180 capsule 2   HYDROcodone-acetaminophen (NORCO/VICODIN) 5-325 MG tablet Take 0.5 tablets by mouth daily as needed (pain.).     Lancet Devices (ONETOUCH DELICA PLUS LANCING) MISC      Lancets (ONETOUCH DELICA PLUS 123XX123) MISC Apply 1 each topically 3 (three) times daily.     losartan (COZAAR) 25 MG tablet Take 1 tablet (25 mg total) by mouth daily. 30 tablet 0   magnesium oxide (MAG-OX) 400 MG tablet Take 1 tablet (400 mg total) by mouth at bedtime. 30 tablet 0   metFORMIN (GLUCOPHAGE) 500 MG tablet Take 1 tablet (500 mg total) by mouth 2 (two) times daily with a meal. 60 tablet 0   metoprolol succinate (TOPROL XL) 25 MG 24 hr tablet Take 1 tablet (25 mg total) by mouth in the morning and at bedtime. = 75 mg (take with 50 mg tablet) 90 tablet 2   metoprolol succinate (TOPROL-XL) 50 MG 24 hr tablet Take 1 tablet (50 mg total) by mouth in the morning and at bedtime. = 75 mg (take with 25 mg tablet) 180 tablet 2   Multiple Vitamins-Minerals (PRESERVISION AREDS 2) CAPS Take 2 capsules by  mouth daily with lunch.     Naphazoline-Glycerin (CLEAR EYES MAX REDNESS RELIEF) 0.03-0.5 % SOLN Place 1 drop into both eyes 2 (two) times daily as needed (dry/irritated eyes.).      ONETOUCH ULTRA test strip      potassium chloride SA (KLOR-CON M) 20 MEQ tablet Take 1 tablet (20 mEq total) by mouth in the morning.     rosuvastatin (CRESTOR) 5 MG tablet Take 5 mg by mouth in the morning.     vitamin B-12 (CYANOCOBALAMIN) 500 MCG tablet Take 500 mcg by mouth in the morning.     warfarin (COUMADIN) 5 MG tablet Take 0.5-1 tablets (2.5-5 mg total) by mouth See admin instructions. Take 0.5 tablet (2.5 mg) by mouth at supper on Mondays, Wednesdays & Fridays. Take 1 tablet (5 mg) by mouth at supper on Tuesdays, Thursdays, Saturdays & Sundays.     torsemide (DEMADEX) 20 MG tablet Take 1 tablet (20 mg total) by mouth 2 (two) times daily. 180 tablet 3   No current facility-administered medications for this visit.     Review of Systems    She denies chest pain, dyspnea, pnd, orthopnea, n, v, syncope, weight gain, or early satiety. All other systems reviewed and are otherwise except as noted above.   Physical Exam    VS:  BP (!) 142/68 (BP Location: Left Arm, Patient Position: Sitting, Cuff Size: Large)   Pulse (!) 59   Ht 5' 3"$  (1.6 m)   Wt 273 lb 6.4 oz (124 kg)   SpO2 92%   BMI 48.43 kg/m  GEN: Well nourished, well developed, in no acute distress. HEENT: normal. Neck: Supple, no JVD, carotid bruits, or masses. Cardiac: RRR, no murmurs, rubs, or gallops. No clubbing, cyanosis, nonpitting bilateral lower extremity edema.  Radials/DP/PT 2+ and equal bilaterally.  Respiratory:  Respirations regular and unlabored, clear to auscultation bilaterally. GI: Soft, nontender, nondistended, BS + x 4. MS: no deformity or atrophy. Skin: warm and dry, no rash. Neuro:  Strength and sensation are intact. Psych: Normal affect.  Accessory Clinical Findings    ECG personally reviewed by me today -no EKG in office today.   Lab Results  Component Value Date   WBC 9.2 08/06/2022   HGB 12.9 08/06/2022   HCT 40.6 08/06/2022   MCV 88 08/06/2022   PLT 193 08/06/2022   Lab Results   Component Value Date   CREATININE 0.82 08/06/2022   BUN 27 08/06/2022   NA 140 08/06/2022   K 4.1 08/06/2022   CL 98 08/06/2022   CO2 22 08/06/2022   Lab Results  Component Value Date   ALT 13 05/28/2022   AST 21 05/28/2022   ALKPHOS 68 05/28/2022   BILITOT 0.8 05/28/2022   Lab Results  Component Value Date   CHOL  02/07/2009    136        ATP III CLASSIFICATION:  <200     mg/dL   Desirable  200-239  mg/dL   Borderline High  >=240    mg/dL   High          HDL 45 02/07/2009   LDLCALC  02/07/2009    74        Total Cholesterol/HDL:CHD Risk Coronary Heart Disease Risk Table                     Men   Women  1/2 Average Risk   3.4   3.3  Average Risk  5.0   4.4  2 X Average Risk   9.6   7.1  3 X Average Risk  23.4   11.0        Use the calculated Patient Ratio above and the CHD Risk Table to determine the patient's CHD Risk.        ATP III CLASSIFICATION (LDL):  <100     mg/dL   Optimal  100-129  mg/dL   Near or Above                    Optimal  130-159  mg/dL   Borderline  160-189  mg/dL   High  >190     mg/dL   Very High   TRIG 84 02/07/2009   CHOLHDL 3.0 02/07/2009    Lab Results  Component Value Date   HGBA1C 6.4 (H) 05/01/2022    Assessment & Plan    1. Persistent atrial fibrillation: Maintaining NSR on exam today.  She was previously scheduled for TEE/DCCV in 07/2022, however, this was canceled as she spontaneously converted to NSR.  She has had 1 breakthrough episode of A-fib since November 2023.  Overall, symptoms have been stable.  Continue Tikosyn, metoprolol, Coumadin.  2. Chronic diastolic heart failure: Echo in 04/2022 showed EF 70 to 75%, no RWMA, indeterminate diastolic parameters.  She does have stable chronic bilateral pedal/lower extremity edema.  Generally euvolemic, well compensated on exam. Discussed ongoing monitoring with daily weights weights, sodium and fluid recommendations. Continue metoprolol, torsemide.    3. Hypertension: BP  elevated in office today, overall well controlled. Continue current antihypertensive regimen.    4. Ascending aortic aneurysm: Following with Dr. Cyndia Bent.  Stable on CT in 09/2022.  Repeat study was recommended in 1 year.   5. Type 2 diabetes: A1C was 7.1 in 09/2022.  Monitored and managed per PCP.   6. OSA: She has not used her CPAP since her COVID infection in July 2023.  Encouraged adherence to CPAP.    7. Disposition: Follow-up in 3 months with a fib clinic, 6 months with Dr. Stanford Breed.   Lenna Sciara, NP 11/28/2022, 12:08 PM

## 2022-12-05 ENCOUNTER — Encounter (INDEPENDENT_AMBULATORY_CARE_PROVIDER_SITE_OTHER): Payer: Medicare Other | Admitting: Ophthalmology

## 2022-12-05 DIAGNOSIS — Z7901 Long term (current) use of anticoagulants: Secondary | ICD-10-CM | POA: Diagnosis not present

## 2022-12-05 DIAGNOSIS — D6869 Other thrombophilia: Secondary | ICD-10-CM | POA: Diagnosis not present

## 2022-12-05 DIAGNOSIS — H35033 Hypertensive retinopathy, bilateral: Secondary | ICD-10-CM | POA: Diagnosis not present

## 2022-12-05 DIAGNOSIS — I10 Essential (primary) hypertension: Secondary | ICD-10-CM | POA: Diagnosis not present

## 2022-12-05 DIAGNOSIS — H353221 Exudative age-related macular degeneration, left eye, with active choroidal neovascularization: Secondary | ICD-10-CM | POA: Diagnosis not present

## 2022-12-05 DIAGNOSIS — H43813 Vitreous degeneration, bilateral: Secondary | ICD-10-CM | POA: Diagnosis not present

## 2022-12-05 DIAGNOSIS — H353112 Nonexudative age-related macular degeneration, right eye, intermediate dry stage: Secondary | ICD-10-CM

## 2022-12-31 DIAGNOSIS — G4733 Obstructive sleep apnea (adult) (pediatric): Secondary | ICD-10-CM | POA: Diagnosis not present

## 2023-01-02 ENCOUNTER — Encounter (INDEPENDENT_AMBULATORY_CARE_PROVIDER_SITE_OTHER): Payer: Medicare Other | Admitting: Ophthalmology

## 2023-01-02 DIAGNOSIS — H35033 Hypertensive retinopathy, bilateral: Secondary | ICD-10-CM | POA: Diagnosis not present

## 2023-01-02 DIAGNOSIS — H353221 Exudative age-related macular degeneration, left eye, with active choroidal neovascularization: Secondary | ICD-10-CM

## 2023-01-02 DIAGNOSIS — I10 Essential (primary) hypertension: Secondary | ICD-10-CM | POA: Diagnosis not present

## 2023-01-02 DIAGNOSIS — H35372 Puckering of macula, left eye: Secondary | ICD-10-CM | POA: Diagnosis not present

## 2023-01-02 DIAGNOSIS — H43813 Vitreous degeneration, bilateral: Secondary | ICD-10-CM | POA: Diagnosis not present

## 2023-01-02 DIAGNOSIS — H353112 Nonexudative age-related macular degeneration, right eye, intermediate dry stage: Secondary | ICD-10-CM | POA: Diagnosis not present

## 2023-01-08 DIAGNOSIS — R300 Dysuria: Secondary | ICD-10-CM | POA: Diagnosis not present

## 2023-01-20 DIAGNOSIS — Z5181 Encounter for therapeutic drug level monitoring: Secondary | ICD-10-CM | POA: Diagnosis not present

## 2023-01-30 ENCOUNTER — Encounter (INDEPENDENT_AMBULATORY_CARE_PROVIDER_SITE_OTHER): Payer: Medicare Other | Admitting: Ophthalmology

## 2023-01-30 DIAGNOSIS — H35372 Puckering of macula, left eye: Secondary | ICD-10-CM

## 2023-01-30 DIAGNOSIS — H35033 Hypertensive retinopathy, bilateral: Secondary | ICD-10-CM

## 2023-01-30 DIAGNOSIS — H43813 Vitreous degeneration, bilateral: Secondary | ICD-10-CM | POA: Diagnosis not present

## 2023-01-30 DIAGNOSIS — I10 Essential (primary) hypertension: Secondary | ICD-10-CM

## 2023-01-30 DIAGNOSIS — H353221 Exudative age-related macular degeneration, left eye, with active choroidal neovascularization: Secondary | ICD-10-CM | POA: Diagnosis not present

## 2023-01-30 DIAGNOSIS — H353112 Nonexudative age-related macular degeneration, right eye, intermediate dry stage: Secondary | ICD-10-CM | POA: Diagnosis not present

## 2023-01-31 ENCOUNTER — Telehealth (HOSPITAL_COMMUNITY): Payer: Self-pay

## 2023-01-31 MED ORDER — METOPROLOL SUCCINATE ER 50 MG PO TB24: ORAL_TABLET | ORAL | Status: DC

## 2023-01-31 NOTE — Telephone Encounter (Signed)
Patient called in regarding her heart rates. Heart rate 36-41. Tuesday she felt dizzy and lightheaded. Patient states since Tuesday night she decreased her Metoprolol to 50 mg BID. Heart rates have been much improved. Hear rate is now 54-60 range and Blood pressure 152/54. Per Eyvonne Left patient should continue taking Metoprolol 50 mg BID. Communicated with patient and she verbalized understanding.

## 2023-02-27 ENCOUNTER — Encounter (INDEPENDENT_AMBULATORY_CARE_PROVIDER_SITE_OTHER): Payer: Medicare Other | Admitting: Ophthalmology

## 2023-02-27 ENCOUNTER — Ambulatory Visit (HOSPITAL_COMMUNITY): Payer: Medicare Other | Admitting: Physician Assistant

## 2023-02-27 DIAGNOSIS — H353112 Nonexudative age-related macular degeneration, right eye, intermediate dry stage: Secondary | ICD-10-CM

## 2023-02-27 DIAGNOSIS — H35033 Hypertensive retinopathy, bilateral: Secondary | ICD-10-CM | POA: Diagnosis not present

## 2023-02-27 DIAGNOSIS — Z7901 Long term (current) use of anticoagulants: Secondary | ICD-10-CM | POA: Diagnosis not present

## 2023-02-27 DIAGNOSIS — I10 Essential (primary) hypertension: Secondary | ICD-10-CM | POA: Diagnosis not present

## 2023-02-27 DIAGNOSIS — H353221 Exudative age-related macular degeneration, left eye, with active choroidal neovascularization: Secondary | ICD-10-CM | POA: Diagnosis not present

## 2023-02-27 DIAGNOSIS — H43813 Vitreous degeneration, bilateral: Secondary | ICD-10-CM | POA: Diagnosis not present

## 2023-02-28 ENCOUNTER — Telehealth (HOSPITAL_COMMUNITY): Payer: Self-pay | Admitting: *Deleted

## 2023-02-28 MED ORDER — METOPROLOL SUCCINATE ER 50 MG PO TB24: 25.0000 mg | ORAL_TABLET | Freq: Two times a day (BID) | ORAL | 2 refills | Status: DC

## 2023-02-28 NOTE — Telephone Encounter (Signed)
Pt having heart rates again in the 30-40 range felt pre-syncopal yesterday at her eye doctor. Discussed with Landry Mellow PA will decrease metoprolol to 25mg  BID and has scheduled follow up for next week. Pt in agreement.

## 2023-03-04 ENCOUNTER — Ambulatory Visit (HOSPITAL_COMMUNITY)
Admission: RE | Admit: 2023-03-04 | Discharge: 2023-03-04 | Disposition: A | Discharge status: Home / Self Care | Payer: Medicare Other | Source: Ambulatory Visit | Attending: Physician Assistant | Admitting: Physician Assistant

## 2023-03-04 VITALS — BP 118/64 | HR 43 | Ht 63.0 in | Wt 275.8 lb

## 2023-03-04 DIAGNOSIS — Z79899 Other long term (current) drug therapy: Secondary | ICD-10-CM | POA: Diagnosis not present

## 2023-03-04 DIAGNOSIS — I110 Hypertensive heart disease with heart failure: Secondary | ICD-10-CM | POA: Insufficient documentation

## 2023-03-04 DIAGNOSIS — G4733 Obstructive sleep apnea (adult) (pediatric): Secondary | ICD-10-CM | POA: Diagnosis not present

## 2023-03-04 DIAGNOSIS — E669 Obesity, unspecified: Secondary | ICD-10-CM | POA: Diagnosis not present

## 2023-03-04 DIAGNOSIS — I4819 Other persistent atrial fibrillation: Secondary | ICD-10-CM | POA: Diagnosis not present

## 2023-03-04 DIAGNOSIS — D6869 Other thrombophilia: Secondary | ICD-10-CM

## 2023-03-04 DIAGNOSIS — I5032 Chronic diastolic (congestive) heart failure: Secondary | ICD-10-CM | POA: Diagnosis not present

## 2023-03-04 DIAGNOSIS — Z7901 Long term (current) use of anticoagulants: Secondary | ICD-10-CM | POA: Diagnosis not present

## 2023-03-04 DIAGNOSIS — Z5181 Encounter for therapeutic drug level monitoring: Secondary | ICD-10-CM

## 2023-03-04 DIAGNOSIS — Z6841 Body Mass Index (BMI) 40.0 and over, adult: Secondary | ICD-10-CM | POA: Insufficient documentation

## 2023-03-04 LAB — BASIC METABOLIC PANEL
Anion gap: 10 (ref 5–15)
BUN: 25 mg/dL — ABNORMAL HIGH (ref 8–23)
CO2: 27 mmol/L (ref 22–32)
Calcium: 9.3 mg/dL (ref 8.9–10.3)
Chloride: 99 mmol/L (ref 98–111)
Creatinine, Ser: 0.73 mg/dL (ref 0.44–1.00)
GFR, Estimated: >60 mL/min (ref >60)
Glucose, Bld: 140 mg/dL — ABNORMAL HIGH (ref 70–99)
Potassium: 4.2 mmol/L (ref 3.5–5.1)
Sodium: 136 mmol/L (ref 135–145)

## 2023-03-04 LAB — MAGNESIUM: Magnesium: 2 mg/dL (ref 1.7–2.4)

## 2023-03-04 MED ORDER — METOPROLOL SUCCINATE ER 25 MG PO TB24: 12.5000 mg | ORAL_TABLET | Freq: Every day | ORAL | 1 refills | Status: DC

## 2023-03-04 MED ORDER — DOFETILIDE 250 MCG PO CAPS: 250.0000 ug | ORAL_CAPSULE | Freq: Two times a day (BID) | ORAL | 2 refills | Status: DC
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
## 2023-03-04 NOTE — Patient Instructions (Signed)
Decrease metoprolol to 12.5mg  once a day at bedtime (1/2 of the 25mg  tablet) - start this tomorrow night

## 2023-03-20 ENCOUNTER — Telehealth (HOSPITAL_COMMUNITY): Payer: Self-pay | Admitting: *Deleted

## 2023-03-20 NOTE — Telephone Encounter (Signed)
Patient heart rates 70-84 on new dose of metoprolol. Feeling better on this dose. Pt will call if issues arise.

## 2023-03-27 ENCOUNTER — Encounter (INDEPENDENT_AMBULATORY_CARE_PROVIDER_SITE_OTHER): Payer: Medicare Other | Admitting: Ophthalmology

## 2023-03-27 DIAGNOSIS — H353221 Exudative age-related macular degeneration, left eye, with active choroidal neovascularization: Secondary | ICD-10-CM

## 2023-03-27 DIAGNOSIS — I10 Essential (primary) hypertension: Secondary | ICD-10-CM | POA: Diagnosis not present

## 2023-03-27 DIAGNOSIS — H353112 Nonexudative age-related macular degeneration, right eye, intermediate dry stage: Secondary | ICD-10-CM

## 2023-03-27 DIAGNOSIS — H43813 Vitreous degeneration, bilateral: Secondary | ICD-10-CM

## 2023-03-27 DIAGNOSIS — H35033 Hypertensive retinopathy, bilateral: Secondary | ICD-10-CM | POA: Diagnosis not present

## 2023-03-27 DIAGNOSIS — H35372 Puckering of macula, left eye: Secondary | ICD-10-CM | POA: Diagnosis not present

## 2023-04-01 DIAGNOSIS — R300 Dysuria: Secondary | ICD-10-CM | POA: Diagnosis not present

## 2023-04-10 DIAGNOSIS — Z7901 Long term (current) use of anticoagulants: Secondary | ICD-10-CM | POA: Diagnosis not present

## 2023-04-16 ENCOUNTER — Ambulatory Visit: Payer: Medicare Other | Admitting: Podiatry

## 2023-04-16 ENCOUNTER — Encounter: Payer: Self-pay | Admitting: Podiatry

## 2023-04-16 DIAGNOSIS — M79675 Pain in left toe(s): Secondary | ICD-10-CM | POA: Diagnosis not present

## 2023-04-16 DIAGNOSIS — B351 Tinea unguium: Secondary | ICD-10-CM

## 2023-04-16 DIAGNOSIS — E1142 Type 2 diabetes mellitus with diabetic polyneuropathy: Secondary | ICD-10-CM

## 2023-04-16 DIAGNOSIS — M79674 Pain in right toe(s): Secondary | ICD-10-CM | POA: Diagnosis not present

## 2023-04-16 NOTE — Progress Notes (Signed)
This patient returns to my office for at risk foot care.  This patient requires this care by a professional since this patient will be at risk due to having type 2 diabetes.  This patient is unable to cut nails herself since the patient cannot reach her nails.These nails are painful walking and wearing shoes.  This patient presents for at risk foot care today.  General Appearance  Alert, conversant and in no acute stress.  Vascular  Dorsalis pedis and posterior tibial  pulses are palpable  bilaterally.  Capillary return is within normal limits  bilaterally. Temperature is within normal limits  bilaterally.  Neurologic  Senn-Weinstein monofilament wire test within normal limits  bilaterally. Muscle power within normal limits bilaterally.  Nails Thick disfigured discolored nails with subungual debris  from hallux to fifth toes bilaterally. No evidence of bacterial infection or drainage bilaterally.  Orthopedic  No limitations of motion  feet .  No crepitus or effusions noted.  No bony pathology or digital deformities noted. HAV  B/L.  Skin  normotropic skin with no porokeratosis noted bilaterally.  No signs of infections or ulcers noted.     Onychomycosis  Pain in right toes  Pain in left toes  Consent was obtained for treatment procedures.   Mechanical debridement of nails 1-5  bilaterally performed with a nail nipper.  Filed with dremel without incident.    Return office visit    3 months                  Told patient to return for periodic foot care and evaluation due to potential at risk complications.   Helane Gunther DPM

## 2023-04-24 ENCOUNTER — Encounter (INDEPENDENT_AMBULATORY_CARE_PROVIDER_SITE_OTHER): Payer: Medicare Other | Admitting: Ophthalmology

## 2023-04-24 ENCOUNTER — Ambulatory Visit: Payer: Medicare Other | Admitting: Podiatry

## 2023-04-24 DIAGNOSIS — H35033 Hypertensive retinopathy, bilateral: Secondary | ICD-10-CM | POA: Diagnosis not present

## 2023-04-24 DIAGNOSIS — H353112 Nonexudative age-related macular degeneration, right eye, intermediate dry stage: Secondary | ICD-10-CM

## 2023-04-24 DIAGNOSIS — I10 Essential (primary) hypertension: Secondary | ICD-10-CM | POA: Diagnosis not present

## 2023-04-24 DIAGNOSIS — H353221 Exudative age-related macular degeneration, left eye, with active choroidal neovascularization: Secondary | ICD-10-CM | POA: Diagnosis not present

## 2023-05-12 DIAGNOSIS — G4733 Obstructive sleep apnea (adult) (pediatric): Secondary | ICD-10-CM | POA: Diagnosis not present

## 2023-05-20 DIAGNOSIS — J029 Acute pharyngitis, unspecified: Secondary | ICD-10-CM | POA: Diagnosis not present

## 2023-05-20 DIAGNOSIS — R509 Fever, unspecified: Secondary | ICD-10-CM | POA: Diagnosis not present

## 2023-05-20 DIAGNOSIS — R051 Acute cough: Secondary | ICD-10-CM | POA: Diagnosis not present

## 2023-05-20 DIAGNOSIS — U071 COVID-19: Secondary | ICD-10-CM | POA: Diagnosis not present

## 2023-05-20 DIAGNOSIS — R300 Dysuria: Secondary | ICD-10-CM | POA: Diagnosis not present

## 2023-05-20 DIAGNOSIS — J209 Acute bronchitis, unspecified: Secondary | ICD-10-CM | POA: Diagnosis not present

## 2023-05-20 DIAGNOSIS — N3000 Acute cystitis without hematuria: Secondary | ICD-10-CM | POA: Diagnosis not present

## 2023-05-21 ENCOUNTER — Emergency Department (HOSPITAL_BASED_OUTPATIENT_CLINIC_OR_DEPARTMENT_OTHER): Payer: Medicare Other

## 2023-05-21 ENCOUNTER — Observation Stay (HOSPITAL_BASED_OUTPATIENT_CLINIC_OR_DEPARTMENT_OTHER)
Admission: EM | Admit: 2023-05-21 | Discharge: 2023-05-23 | Disposition: A | Discharge status: Home Health | Payer: Medicare Other | Attending: Internal Medicine | Admitting: Internal Medicine

## 2023-05-21 ENCOUNTER — Encounter (HOSPITAL_BASED_OUTPATIENT_CLINIC_OR_DEPARTMENT_OTHER): Payer: Self-pay

## 2023-05-21 DIAGNOSIS — Z7901 Long term (current) use of anticoagulants: Secondary | ICD-10-CM | POA: Insufficient documentation

## 2023-05-21 DIAGNOSIS — E119 Type 2 diabetes mellitus without complications: Secondary | ICD-10-CM | POA: Insufficient documentation

## 2023-05-21 DIAGNOSIS — Z6841 Body Mass Index (BMI) 40.0 and over, adult: Secondary | ICD-10-CM | POA: Diagnosis not present

## 2023-05-21 DIAGNOSIS — I10 Essential (primary) hypertension: Secondary | ICD-10-CM | POA: Diagnosis present

## 2023-05-21 DIAGNOSIS — I5031 Acute diastolic (congestive) heart failure: Secondary | ICD-10-CM | POA: Diagnosis not present

## 2023-05-21 DIAGNOSIS — I480 Paroxysmal atrial fibrillation: Secondary | ICD-10-CM | POA: Diagnosis not present

## 2023-05-21 DIAGNOSIS — Z96653 Presence of artificial knee joint, bilateral: Secondary | ICD-10-CM | POA: Diagnosis not present

## 2023-05-21 DIAGNOSIS — R0600 Dyspnea, unspecified: Secondary | ICD-10-CM | POA: Diagnosis not present

## 2023-05-21 DIAGNOSIS — U071 COVID-19: Principal | ICD-10-CM | POA: Insufficient documentation

## 2023-05-21 DIAGNOSIS — Z86718 Personal history of other venous thrombosis and embolism: Secondary | ICD-10-CM | POA: Insufficient documentation

## 2023-05-21 DIAGNOSIS — I4891 Unspecified atrial fibrillation: Secondary | ICD-10-CM | POA: Diagnosis present

## 2023-05-21 DIAGNOSIS — Z7984 Long term (current) use of oral hypoglycemic drugs: Secondary | ICD-10-CM | POA: Insufficient documentation

## 2023-05-21 DIAGNOSIS — E1169 Type 2 diabetes mellitus with other specified complication: Secondary | ICD-10-CM | POA: Diagnosis present

## 2023-05-21 DIAGNOSIS — R0602 Shortness of breath: Secondary | ICD-10-CM | POA: Diagnosis present

## 2023-05-21 DIAGNOSIS — I110 Hypertensive heart disease with heart failure: Secondary | ICD-10-CM | POA: Diagnosis not present

## 2023-05-21 DIAGNOSIS — I517 Cardiomegaly: Secondary | ICD-10-CM | POA: Diagnosis not present

## 2023-05-21 DIAGNOSIS — M109 Gout, unspecified: Secondary | ICD-10-CM | POA: Diagnosis present

## 2023-05-21 DIAGNOSIS — I5032 Chronic diastolic (congestive) heart failure: Secondary | ICD-10-CM | POA: Diagnosis present

## 2023-05-21 DIAGNOSIS — K219 Gastro-esophageal reflux disease without esophagitis: Secondary | ICD-10-CM | POA: Diagnosis present

## 2023-05-21 NOTE — ED Provider Notes (Signed)
[place] EMERGENCY DEPARTMENT AT [facility] Provider Note   CSN: [id number] Arrival date & time: 05/21/23  2244     History {Add pertinent medical, surgical, social history, OB history to HPI:1} Chief Complaint  Patient presents with   Shortness of Breath    Natasha Glass is a 82 y.o. female.  The history is provided by the patient and a relative.  Patient presents with shortness of breath. Patient reports over 2 days ago she began having chills cough, shortness of breath.  She was seen at her PCP/ urgent care was diagnosed with COVID-19.  She was given molnupiravir, prednisone and doxycycline.  Since that time her cough and shortness of breath have worsened.  No chest pain, no hemoptysis.  Tmax has been 100.4 No vomiting.  No abdominal pain.  She is a non-smoker.  She reports increasing shortness of breath and palpitations.  Prior to arrival tonight she noted her heart rate was increased she felt very weak and dizzy.     Home Medications Prior to Admission medications   Medication Sig Start Date End Date Taking? Authorizing Provider  acetaminophen (TYLENOL) 500 MG tablet Take 500-1,000 mg by mouth every 6 (six) hours as needed (pain/sleep).    [provider]  allopurinol (ZYLOPRIM) 300 MG tablet Take 0.5 tablets (150 mg total) by mouth every evening. 09/14/21   Sharee Holster, NP  Cholecalciferol (VITAMIN D3) 25 MCG (1000 UT) CAPS Take 2,000 Units by mouth in the morning.    [provider]  clobetasol cream (TEMOVATE) 0.05 % Apply 1 application topically as needed. Patient taking differently: Apply 1 application  topically daily as needed (skin irritation (legs)). 09/14/21   Sharee Holster, NP  dofetilide (TIKOSYN) 250 MCG capsule Take 1 capsule (250 mcg total) by mouth 2 (two) times daily. 03/04/23   [name], PA-C  HYDROcodone-acetaminophen (NORCO/VICODIN) 5-325 MG tablet Take 0.5 tablets by mouth daily as needed (pain.). 05/27/22    [provider]  Lancet Devices [facility] DELICA PLUS LANCING) MISC  07/19/21   [provider]  Lancets [facility] DELICA PLUS [place]) MISC Apply 1 each topically 3 (three) times daily. 07/19/21   [provider]  losartan (COZAAR) 25 MG tablet Take 1 tablet (25 mg total) by mouth daily. 09/14/21   Sharee Holster, NP  magnesium oxide (MAG-OX) 400 MG tablet Take 1 tablet (400 mg total) by mouth at bedtime. Patient taking differently: Take 250 mg by mouth at bedtime. 09/12/21   Sharee Holster, NP  metFORMIN (GLUCOPHAGE) 500 MG tablet Take 1 tablet (500 mg total) by mouth 2 (two) times daily with a meal. 09/14/21   Chilton Si, Chong Sicilian, NP  metoprolol succinate (TOPROL-XL) 25 MG 24 hr tablet Take 0.5 tablets (12.5 mg total) by mouth at bedtime. 03/04/23 08/25/24  [name], PA-C  Multiple Vitamins-Minerals (PRESERVISION AREDS 2) CAPS Take 2 capsules by mouth daily with lunch.    [provider]  Naphazoline-Glycerin (CLEAR EYES MAX REDNESS RELIEF) 0.03-0.5 % SOLN Place 1 drop into both eyes 2 (two) times daily as needed (dry/irritated eyes.).    [provider]  [facility] ULTRA test strip  10/03/21   [provider]  potassium chloride SA (KLOR-CON M) 20 MEQ tablet Take 1 tablet (20 mEq total) by mouth in the morning. 08/13/22   Quintella Reichert, MD  rosuvastatin (CRESTOR) 5 MG tablet Take 5 mg by mouth in the morning. 03/26/22   [provider]  torsemide (  DEMADEX) 20 MG tablet Take 1 tablet (20 mg total) by mouth 2 (two) times daily. 06/27/22 03/04/23  Joylene Grapes, NP  vitamin B-12 (CYANOCOBALAMIN) 500 MCG tablet Take 500 mcg by mouth in the morning.    [provider]  warfarin (COUMADIN) 5 MG tablet Take 0.5-1 tablets (2.5-5 mg total) by mouth See admin instructions. Take 0.5 tablet (2.5 mg) by mouth at supper on Mondays, Wednesdays & Fridays. Take 1 tablet (5 mg) by mouth at supper on Tuesdays, Thursdays, Saturdays &  Sundays. 08/13/22   Quintella Reichert, MD      Allergies    Bactrim [sulfamethoxazole-trimethoprim], Morphine and codeine, Oysters [shellfish allergy], Amlodipine, Percocet [oxycodone-acetaminophen], Tdap [tetanus-diphth-acell pertussis], Tetanus toxoids, Tramadol, Lactose intolerance (gi), Other, Tegretol [carbamazepine], Tetanus-diphtheria toxoids td, Topiramate, Diovan [valsartan], Diphtheria toxoid, and Macrodantin [nitrofurantoin macrocrystal]    Review of Systems   Review of Systems  Constitutional:  Positive for fatigue and fever.  Respiratory:  Positive for cough and shortness of breath.   Neurological:  Positive for dizziness.    Physical Exam Updated Vital Signs BP (!) 130/118 (BP Location: Left Arm)   Pulse (!) 132   Temp 98.2 F (36.8 C)   Ht 1.6 m (5\' 3" )   Wt 123.4 kg   SpO2 92%   BMI 48.18 kg/m  Physical Exam CONSTITUTIONAL: Elderly, ill-appearing HEAD: Normocephalic/atraumatic EYES: EOMI/PERRL ENMT: Mucous membranes moist NECK: supple no meningeal signs CV: Tachycardic LUNGS: Lungs are clear to auscultation bilaterally, no apparent distress ABDOMEN: soft, nontender GU:no cva tenderness NEURO: Pt is awake/alert/appropriate, moves all extremitiesx4.  No facial droop.   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 SKIN: warm, color normal PSYCH: no abnormalities of mood noted, alert and oriented to situation  ED Results / Procedures / Treatments   Labs (all labs ordered are listed, but only abnormal results are displayed) Labs Reviewed  CULTURE, BLOOD (ROUTINE X 2)  CULTURE, BLOOD (ROUTINE X 2)  SARS CORONAVIRUS 2 BY RT PCR  COMPREHENSIVE METABOLIC PANEL  CBC  BRAIN NATRIURETIC PEPTIDE  LACTIC ACID, PLASMA  LACTIC ACID, PLASMA  PROTIME-INR  TROPONIN I (HIGH SENSITIVITY)    EKG EKG Interpretation Date/Time:  Wednesday May 21 2023 23:26:14 EDT Ventricular Rate:  131 PR Interval:     QRS Duration:  104 QT Interval:  358 QTC Calculation: 529 R Axis:   179  Text Interpretation: Atrial fibrillation Ventricular premature complex Aberrant conduction of SV complex(es) Abnormal R-wave progression, late transition Interpretation limited secondary to artifact Confirmed by Zadie Rhine ([id number]) on 05/21/2023 11:44:16 PM  Radiology DG Chest Port 1 View  Result Date: 05/21/2023 CLINICAL DATA:  Dyspnea EXAM: PORTABLE CHEST 1 VIEW COMPARISON:  07/22/2022 FINDINGS: Lungs are clear. No pneumothorax or pleural effusion. Cardiac size is mildly enlarged. Pulmonary vascularity is normal. No acute bone abnormality. IMPRESSION: 1. Mild cardiomegaly. Electronically Signed   By: Helyn Numbers M.D.   On: 05/21/2023 23:40    Procedures .Critical Care  Performed by: Zadie Rhine, MD Authorized by: Zadie Rhine, MD   Critical care provider statement:    Critical care start time:  05/21/2023 11:46 PM   Critical care end time:  05/21/2023 11:46 PM   Critical care time was exclusive of:  Separately billable procedures and treating other patients   Critical care was necessary to treat or prevent imminent or life-threatening deterioration of the following conditions:  Circulatory failure, cardiac failure and respiratory failure   Critical care was time spent personally by  me on the following activities:  Obtaining history from patient or surrogate, examination of patient, development of treatment plan with patient or surrogate, pulse oximetry, re-evaluation of patient's condition, ordering and review of radiographic studies, ordering and performing treatments and interventions, ordering and review of laboratory studies and review of old charts   I assumed direction of critical care for this patient from another provider in my specialty: no     Care discussed with: admitting provider     {Document cardiac monitor, telemetry assessment procedure when appropriate:1}  Medications Ordered in  ED Medications - No data to display  ED Course/ Medical Decision Making/ A&P   {   Click here for ABCD2, HEART and other calculatorsREFRESH Note before signing :1}                              Medical Decision Making Amount and/or Complexity of Data Reviewed Labs: ordered. Radiology: ordered.   This patient presents to the ED for concern of shortness of breath, this involves an extensive number of treatment options, and is a complaint that carries with it a high risk of complications and morbidity.  The differential diagnosis includes but is not limited to Acute coronary syndrome, pneumonia, acute pulmonary edema, pneumothorax, acute anemia, pulmonary embolism   Comorbidities that complicate the patient evaluation: Patient's presentation is complicated by their history of atrial fibrillation, diastolic CHF  Social Determinants of Health: Patient's  obesity   increases the complexity of managing their presentation  Additional history obtained: Additional history obtained from family Records reviewed  cardiology notes reviewed  Lab Tests: I Ordered, and personally interpreted labs.  The pertinent results include:  ***  Imaging Studies ordered: I ordered imaging studies including X-ray chest   I independently visualized and interpreted imaging which showed cardiomegaly noted I agree with the radiologist interpretation  Cardiac Monitoring: The patient was maintained on a cardiac monitor.  I personally viewed and interpreted the cardiac monitor which showed an underlying rhythm of:  Atrial Fibrillation  Medicines ordered and prescription drug management: I ordered medication including ***  for ***  Reevaluation of the patient after these medicines showed that the patient    {resolved/improved/worsened:23923::"improved"}  Test Considered: Patient is low risk / negative by ***, therefore do not feel that *** is indicated.  Critical Interventions:  ***  Consultations  Obtained: I requested consultation with the {consultation:26851}, and discussed  findings as well as pertinent plan - they recommend: ***  Reevaluation: After the interventions noted above, I reevaluated the patient and found that they have :{resolved/improved/worsened:23923::"improved"}  Complexity of problems addressed: Patient's presentation is most consistent with  acute presentation with potential threat to life or bodily function  Disposition: After consideration of the diagnostic results and the patient's response to treatment,  I feel that the patent would benefit from admission   .     {Document critical care time when appropriate:1} {Document review of labs and clinical decision tools ie heart score, Chads2Vasc2 etc:1}  {Document your independent review of radiology images, and any outside records:1} {Document your discussion with family members, caretakers, and with consultants:1} {Document social determinants of health affecting pt's care:1} {Document your decision making why or why not admission, treatments were needed:1} Final Clinical Impression(s) / ED Diagnoses Final diagnoses:  None    Rx / DC Orders ED Discharge Orders     None

## 2023-05-21 NOTE — ED Triage Notes (Signed)
Pt arrived POV for increase SOB that has increased today. Seen at PCP yesterday and had some medication changes. PCP stated was dx with UTI and positive COVID. Started on ABX and prednisone. Pt has congested cough non-productive. Pt also has chronic bilateral LE edema.  Pt denies CP, no n/v. Reports HX of Afib and CHF.   Pt able to speak in complete sentences.

## 2023-05-22 ENCOUNTER — Encounter (INDEPENDENT_AMBULATORY_CARE_PROVIDER_SITE_OTHER): Payer: Medicare Other | Admitting: Ophthalmology

## 2023-05-22 ENCOUNTER — Emergency Department (HOSPITAL_BASED_OUTPATIENT_CLINIC_OR_DEPARTMENT_OTHER): Payer: Medicare Other

## 2023-05-22 DIAGNOSIS — U071 COVID-19: Principal | ICD-10-CM | POA: Diagnosis present

## 2023-05-22 DIAGNOSIS — I480 Paroxysmal atrial fibrillation: Secondary | ICD-10-CM | POA: Diagnosis present

## 2023-05-22 DIAGNOSIS — E782 Mixed hyperlipidemia: Secondary | ICD-10-CM

## 2023-05-22 DIAGNOSIS — I5032 Chronic diastolic (congestive) heart failure: Secondary | ICD-10-CM

## 2023-05-22 DIAGNOSIS — I10 Essential (primary) hypertension: Secondary | ICD-10-CM

## 2023-05-22 DIAGNOSIS — D3501 Benign neoplasm of right adrenal gland: Secondary | ICD-10-CM | POA: Diagnosis not present

## 2023-05-22 DIAGNOSIS — I7121 Aneurysm of the ascending aorta, without rupture: Secondary | ICD-10-CM | POA: Diagnosis not present

## 2023-05-22 DIAGNOSIS — E1169 Type 2 diabetes mellitus with other specified complication: Secondary | ICD-10-CM

## 2023-05-22 DIAGNOSIS — I4891 Unspecified atrial fibrillation: Secondary | ICD-10-CM

## 2023-05-22 DIAGNOSIS — E119 Type 2 diabetes mellitus without complications: Secondary | ICD-10-CM | POA: Diagnosis not present

## 2023-05-22 DIAGNOSIS — M10029 Idiopathic gout, unspecified elbow: Secondary | ICD-10-CM | POA: Diagnosis not present

## 2023-05-22 LAB — PROCALCITONIN: Procalcitonin: <0.1 ng/mL

## 2023-05-22 LAB — HEMOGLOBIN A1C
Hgb A1c MFr Bld: 6.5 % — ABNORMAL HIGH (ref 4.8–5.6)
Mean Plasma Glucose: 139.85 mg/dL

## 2023-05-22 LAB — PROTIME-INR
INR: 3.6 — ABNORMAL HIGH (ref 0.8–1.2)
Prothrombin Time: 36.4 seconds — ABNORMAL HIGH (ref 11.4–15.2)

## 2023-05-22 LAB — GLUCOSE, CAPILLARY
Glucose-Capillary: 138 mg/dL — ABNORMAL HIGH (ref 70–99)
Glucose-Capillary: 154 mg/dL — ABNORMAL HIGH (ref 70–99)
Glucose-Capillary: 113 mg/dL — ABNORMAL HIGH (ref 70–99)
Glucose-Capillary: 170 mg/dL — ABNORMAL HIGH (ref 70–99)

## 2023-05-22 LAB — C-REACTIVE PROTEIN: CRP: 1.6 mg/dL — ABNORMAL HIGH (ref <1.0)

## 2023-05-22 LAB — LACTIC ACID, PLASMA: Lactic Acid, Venous: 1.5 mmol/L (ref 0.5–1.9)

## 2023-05-22 LAB — MAGNESIUM: Magnesium: 1.8 mg/dL (ref 1.7–2.4)

## 2023-05-22 MED ORDER — ROSUVASTATIN CALCIUM 5 MG PO TABS
5.0000 mg | ORAL_TABLET | Freq: Every day | ORAL | Status: DC
  Administered 2023-05-22 – 2023-05-23 (×2): 5 mg via ORAL
  Filled 2023-05-22 (×2): qty 1

## 2023-05-22 MED ORDER — INSULIN ASPART 100 UNIT/ML IJ SOLN
0.0000 [IU] | Freq: Three times a day (TID) | INTRAMUSCULAR | Status: DC
  Administered 2023-05-22: 2 [IU] via SUBCUTANEOUS

## 2023-05-22 MED ORDER — METOPROLOL SUCCINATE ER 25 MG PO TB24
12.5000 mg | ORAL_TABLET | Freq: Every day | ORAL | Status: DC
  Administered 2023-05-22: 12.5 mg via ORAL
  Filled 2023-05-22: qty 1

## 2023-05-22 MED ORDER — ALBUTEROL SULFATE (2.5 MG/3ML) 0.083% IN NEBU: 2.5000 mg | INHALATION_SOLUTION | RESPIRATORY_TRACT | Status: DC | PRN

## 2023-05-22 MED ORDER — DOFETILIDE 250 MCG PO CAPS
250.0000 ug | ORAL_CAPSULE | Freq: Two times a day (BID) | ORAL | Status: DC
  Administered 2023-05-22 – 2023-05-23 (×3): 250 ug via ORAL
  Filled 2023-05-22 (×4): qty 1

## 2023-05-22 MED ORDER — WARFARIN - PHARMACIST DOSING INPATIENT: Freq: Every day | Status: DC

## 2023-05-22 MED ORDER — LEVALBUTEROL HCL 0.63 MG/3ML IN NEBU: 0.6300 mg | INHALATION_SOLUTION | Freq: Four times a day (QID) | RESPIRATORY_TRACT | Status: DC | PRN

## 2023-05-22 MED ORDER — POTASSIUM CHLORIDE CRYS ER 20 MEQ PO TBCR
20.0000 meq | EXTENDED_RELEASE_TABLET | Freq: Every day | ORAL | Status: DC
  Administered 2023-05-23: 20 meq via ORAL
  Filled 2023-05-22: qty 1

## 2023-05-22 MED ORDER — ALLOPURINOL 300 MG PO TABS
150.0000 mg | ORAL_TABLET | Freq: Every evening | ORAL | Status: DC
  Administered 2023-05-22: 150 mg via ORAL
  Filled 2023-05-22: qty 1

## 2023-05-22 MED ORDER — MAGNESIUM OXIDE -MG SUPPLEMENT 400 (240 MG) MG PO TABS
400.0000 mg | ORAL_TABLET | Freq: Every day | ORAL | Status: DC
  Administered 2023-05-22 – 2023-05-23 (×2): 400 mg via ORAL
  Filled 2023-05-22 (×2): qty 1

## 2023-05-22 MED ORDER — HYDROCODONE-ACETAMINOPHEN 5-325 MG PO TABS: 1.0000 | ORAL_TABLET | ORAL | Status: DC | PRN

## 2023-05-22 MED ORDER — VITAMIN B-12 1000 MCG PO TABS
500.0000 ug | ORAL_TABLET | Freq: Every day | ORAL | Status: DC
  Administered 2023-05-22 – 2023-05-23 (×2): 500 ug via ORAL
  Filled 2023-05-22 (×2): qty 1

## 2023-05-22 MED ORDER — TORSEMIDE 20 MG PO TABS
20.0000 mg | ORAL_TABLET | Freq: Two times a day (BID) | ORAL | Status: DC
  Administered 2023-05-22 – 2023-05-23 (×3): 20 mg via ORAL
  Filled 2023-05-22 (×3): qty 1

## 2023-05-22 MED ORDER — HYDROCOD POLI-CHLORPHE POLI ER 10-8 MG/5ML PO SUER: 5.0000 mL | Freq: Two times a day (BID) | ORAL | Status: DC | PRN

## 2023-05-22 MED ORDER — LACTATED RINGERS IV BOLUS (SEPSIS)
1000.0000 mL | Freq: Once | INTRAVENOUS | Status: AC
  Administered 2023-05-22: 1000 mL via INTRAVENOUS

## 2023-05-22 MED ORDER — ACETAMINOPHEN 650 MG RE SUPP: 650.0000 mg | Freq: Four times a day (QID) | RECTAL | Status: DC | PRN

## 2023-05-22 MED ORDER — BENZONATATE 100 MG PO CAPS
200.0000 mg | ORAL_CAPSULE | Freq: Three times a day (TID) | ORAL | Status: DC
  Administered 2023-05-22 – 2023-05-23 (×2): 200 mg via ORAL
  Filled 2023-05-22 (×2): qty 2

## 2023-05-22 MED ORDER — ONDANSETRON HCL 4 MG/2ML IJ SOLN: 4.0000 mg | Freq: Four times a day (QID) | INTRAMUSCULAR | Status: DC | PRN

## 2023-05-22 MED ORDER — GUAIFENESIN ER 600 MG PO TB12
600.0000 mg | ORAL_TABLET | Freq: Two times a day (BID) | ORAL | Status: DC
  Administered 2023-05-22 – 2023-05-23 (×3): 600 mg via ORAL
  Filled 2023-05-22 (×3): qty 1

## 2023-05-22 MED ORDER — ONDANSETRON HCL 4 MG PO TABS: 4.0000 mg | ORAL_TABLET | Freq: Four times a day (QID) | ORAL | Status: DC | PRN

## 2023-05-22 MED ORDER — SODIUM CHLORIDE 0.9 % IV SOLN
500.0000 mg | INTRAVENOUS | Status: DC
  Administered 2023-05-22: 500 mg via INTRAVENOUS
  Filled 2023-05-22: qty 5

## 2023-05-22 MED ORDER — LOSARTAN POTASSIUM 50 MG PO TABS
25.0000 mg | ORAL_TABLET | Freq: Every day | ORAL | Status: DC
  Administered 2023-05-22 – 2023-05-23 (×2): 25 mg via ORAL
  Filled 2023-05-22 (×2): qty 1

## 2023-05-22 MED ORDER — ACETAMINOPHEN 325 MG PO TABS: 650.0000 mg | ORAL_TABLET | Freq: Four times a day (QID) | ORAL | Status: DC | PRN

## 2023-05-22 MED ORDER — POTASSIUM CHLORIDE CRYS ER 10 MEQ PO TBCR
30.0000 meq | EXTENDED_RELEASE_TABLET | Freq: Once | ORAL | Status: AC
  Administered 2023-05-22: 30 meq via ORAL
  Filled 2023-05-22: qty 3

## 2023-05-22 MED ORDER — SODIUM CHLORIDE 0.9 % IV SOLN
2.0000 g | INTRAVENOUS | Status: DC
  Administered 2023-05-22: 2 g via INTRAVENOUS
  Filled 2023-05-22: qty 20

## 2023-05-22 MED ORDER — LACTATED RINGERS IV BOLUS (SEPSIS)
600.0000 mL | Freq: Once | INTRAVENOUS | Status: AC
  Administered 2023-05-22: 600 mL via INTRAVENOUS

## 2023-05-22 MED ORDER — DOCUSATE SODIUM 100 MG PO CAPS
100.0000 mg | ORAL_CAPSULE | Freq: Two times a day (BID) | ORAL | Status: DC
  Filled 2023-05-22: qty 1

## 2023-05-22 MED ORDER — LEVALBUTEROL HCL 0.63 MG/3ML IN NEBU: 0.6300 mg | INHALATION_SOLUTION | Freq: Three times a day (TID) | RESPIRATORY_TRACT | Status: DC

## 2023-05-22 MED ORDER — MELATONIN 5 MG PO TABS
5.0000 mg | ORAL_TABLET | Freq: Every evening | ORAL | Status: DC | PRN
  Administered 2023-05-22: 5 mg via ORAL
  Filled 2023-05-22: qty 1

## 2023-05-22 NOTE — Sepsis Progress Note (Signed)
Followed up with bedside RN regarding repeat lactic, still not collected.  Per RN, she is still trying to reach phlebotomist.

## 2023-05-22 NOTE — TOC Progression Note (Signed)
Transition of Care [facility]) - Progression Note    Patient Details  Name: Natasha Glass MRN: [id number] Date of Birth: 12/29/1940  Transition of Care [facility]) CM/SW Contact  Gordy Clement, RN Phone Number: 05/22/2023, 12:42 PM  Clinical Narrative:     Code 44 given per UM          Expected Discharge Plan and Services                                               Social Determinants of Health (SDOH) Interventions SDOH Screenings   Tobacco Use: Low Risk  (05/21/2023)    Readmission Risk Interventions    08/30/2021   12:37 PM  Readmission Risk Prevention Plan  Medication Screening Complete  Transportation Screening Complete

## 2023-05-22 NOTE — Plan of Care (Signed)
  Problem: Education: Goal: Knowledge of General Education information will improve Description Including pain rating scale, medication(s)/side effects and non-pharmacologic comfort measures Outcome: Progressing   

## 2023-05-22 NOTE — Care Management CC44 (Signed)
Condition Code 44 Documentation Completed  Patient Details  Name: ALYIAH OIEN MRN: [id number] Date of Birth: 07-12-1941   Condition Code 44 given:  Yes Patient signature on Condition Code 44 notice:  Yes Documentation of 2 MD's agreement:  Yes Code 44 added to claim:  Yes    Gordy Clement, RN 05/22/2023, 12:42 PM

## 2023-05-22 NOTE — Care Management CC44 (Signed)
Condition Code 44 Documentation Completed  Patient Details  Name: Natasha Glass MRN: [id number] Date of Birth: 10/03/1941   Condition Code 44 given:  Yes Patient signature on Condition Code 44 notice:  Yes Documentation of 2 MD's agreement:  Yes Code 44 added to claim:  Yes    Gordy Clement, RN 05/22/2023, 12:39 PM

## 2023-05-22 NOTE — Sepsis Progress Note (Signed)
Elink monitoring for the code sepsis protocol.  

## 2023-05-22 NOTE — H&P (Signed)
History and Physical    Natasha Glass ZOX:[id number] DOB: May 05, 1941 DOA: 05/21/2023  PCP: [facility]   Patient coming from: Home   I have personally briefly reviewed patient's old medical records in [facility]  Chief Complaint:    HPI: Natasha Glass is a 82 y.o. female with medical history significant of hypertension, hyperlipidemia, diabetes mellitus type 2, non-insulin-dependent, chronic congestive heart failure with preserved ejection fraction (EF 70-75% in July 2023), paroxysmal atrial fibrillation on anticoagulation with warfarin, gout, gastroesophageal reflux disease who came to hospital with complaints of shortness of breath.  Patient presented initially to [facility] ED with complaints of worsening shortness of breath, cough, congestion since Sunday, 05/18/2023.  Patient was seen by her PCP on 05/19/2023 when she was diagnosed with COVID-19 virus infection.  She was given doxycycline, molnupiravir and prednisone, without significant improvement in her symptoms.  Prior to arrival to ED patient reported palpitations, without associated chest pain.  ED Course:   In the ED patient was afebrile with temperature 98.2 F.  Blood pressure was stable 127/89. Initially heart rate was elevated up to 163/min, with telemetry showing atrial fibrillation with rapid ventricular problems, but subsequently heart rate normalized at 81/min.  No hypoxia, 97% on room air.. CBC showed no leukocytosis, revealed mild anemia hemoglobin 11.6 and thrombocytopenia 144. Chemistry showed no significant abnormalities.  BNP 106.  Lactate 3.2. Patient tested positive for COVID-19.. 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  Patient was treated with IV antibiotics ceftriaxone and azithromycin, IV fluids and will be admitted to hospital for further evaluation  and treatment.  Review of Systems: Review of Systems  Constitutional:  Positive for malaise/fatigue. Negative for chills and fever.  HENT:  Positive for congestion. Negative for ear discharge, hearing loss, nosebleeds and tinnitus.   Eyes:  Negative for blurred vision, double vision, pain and discharge.  Respiratory:  Positive for cough, sputum production and shortness of breath.   Cardiovascular:  Positive for palpitations. Negative for chest pain, orthopnea, claudication, leg swelling and PND.  Gastrointestinal:  Negative for abdominal pain, constipation, diarrhea, nausea and vomiting.  Genitourinary:  Negative for dysuria, frequency, hematuria and urgency.  Musculoskeletal:  Positive for myalgias. Negative for back pain, falls, joint pain and neck pain.  Skin:  Negative for rash.  Neurological:  Negative for dizziness, tingling, sensory change, speech change, focal weakness, seizures and headaches.  Endo/Heme/Allergies:  Does not bruise/bleed easily.  Psychiatric/Behavioral:  Negative for depression and hallucinations. The patient is not nervous/anxious.    As per HPI otherwise all other systems reviewed and are negative.  Past Medical History:  Diagnosis Date   Anemia    Arthritis    CHF (congestive heart failure) (HCC)    DVT (deep venous thrombosis) (HCC) 01/2009   Dysrhythmia    GERD (gastroesophageal reflux disease)    Gout    History of blood transfusion    1990's   History of colonic polyps 09/2010   History of pneumonia    Hyperlipidemia    Hypertension    Lactose intolerance    Macular degeneration, dry    bilateral   Migraine    Obesity    Osteopenia    PAF (paroxysmal atrial fibrillation) (HCC)    Peripheral neuropathy    Pulmonary embolism (HCC) 01/2009   Sleep apnea    CPAP pressure 4.0-16.0   Thoracic ascending aortic aneurysm (HCC)  Type II diabetes mellitus (HCC)    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)    Urinary incontinence     Past  Surgical History:  Procedure Laterality Date   ABDOMINAL EXPLORATION SURGERY  1960   ABDOMINAL HYSTERECTOMY  1986   CARDIAC CATHETERIZATION     CARDIOVERSION N/A 05/07/2018   Procedure: CARDIOVERSION;  Surgeon: Wendall Stade, MD;  Location: [facility] ENDOSCOPY;  Service: Cardiovascular;  Laterality: N/A;   COLONOSCOPY W/ BIOPSIES AND POLYPECTOMY  2005; 2011; 2012   COLONOSCOPY WITH PROPOFOL N/A 09/10/2016   Procedure: COLONOSCOPY WITH PROPOFOL;  Surgeon: Charolett Bumpers, MD;  Location: [facility] ENDOSCOPY;  Service: Endoscopy;  Laterality: N/A;   JOINT REPLACEMENT     KNEE ARTHROSCOPY Left ~ 1999   SHOULDER OPEN ROTATOR CUFF REPAIR Left 2011   TEE WITHOUT CARDIOVERSION N/A 08/13/2022   Procedure: TRANSESOPHAGEAL ECHOCARDIOGRAM (TEE);  Surgeon: Quintella Reichert, MD;  Location: [facility] ENDOSCOPY;  Service: Cardiovascular;  Laterality: N/A;   TOTAL KNEE ARTHROPLASTY Bilateral 2005-2012   left-right    Social History  reports that she has never smoked. She has never used smokeless tobacco. She reports that she does not drink alcohol and does not use drugs.  Allergies  Allergen Reactions   Bactrim [Sulfamethoxazole-Trimethoprim]     Drop in BP   Morphine And Codeine Other (See Comments)    Unresponsive-Very bad reaction.  Can take hydrocodone.    Oysters [Shellfish Allergy] Shortness Of Breath, Itching, Swelling and Rash   Amlodipine Swelling and Other (See Comments)    Of legs   Percocet [Oxycodone-Acetaminophen] Other (See Comments)    hallucinations   Tdap [Tetanus-Diphth-Acell Pertussis] Itching, Swelling and Rash   Tetanus Toxoids Itching, Swelling and Rash   Tramadol Swelling and Other (See Comments)    Of legs   Lactose Intolerance (Gi) Other (See Comments)   Other Other (See Comments)   Tegretol [Carbamazepine] Other (See Comments)    unknown   Tetanus-Diphtheria Toxoids Td Other (See Comments)   Topiramate Other (See Comments)   Diovan [Valsartan] Other (See Comments)    Cough    Diphtheria Toxoid Itching, Swelling and Rash   Macrodantin [Nitrofurantoin Macrocrystal] Other (See Comments)    unspecified    Family History  Problem Relation Age of Onset   Colon cancer Mother    Lung cancer Mother    Heart disease Father    Prior to Admission medications   Medication Sig Start Date End Date Taking? Authorizing Provider  acetaminophen (TYLENOL) 500 MG tablet Take 500-1,000 mg by mouth every 6 (six) hours as needed (pain/sleep).    [provider]  allopurinol (ZYLOPRIM) 300 MG tablet Take 0.5 tablets (150 mg total) by mouth every evening. 09/14/21   Sharee Holster, NP  Cholecalciferol (VITAMIN D3) 25 MCG (1000 UT) CAPS Take 2,000 Units by mouth in the morning.    [provider]  clobetasol cream (TEMOVATE) 0.05 % Apply 1 application topically as needed. Patient taking differently: Apply 1 application  topically daily as needed (skin irritation (legs)). 09/14/21   Sharee Holster, NP  dofetilide (TIKOSYN) 250 MCG capsule Take 1 capsule (250 mcg total) by mouth 2 (two) times daily. 03/04/23   [name], PA-C  HYDROcodone-acetaminophen (NORCO/VICODIN) 5-325 MG tablet Take 0.5 tablets by mouth daily as needed (pain.). 05/27/22   [provider]  Lancet [facility] DELICA PLUS LANCING) MISC  07/19/21   [provider]  Lancets [facility] DELICA PLUS [place]) MISC Apply 1 each topically 3 (three)  times daily. 07/19/21   [provider]  losartan (COZAAR) 25 MG tablet Take 1 tablet (25 mg total) by mouth daily. 09/14/21   Sharee Holster, NP  magnesium oxide (MAG-OX) 400 MG tablet Take 1 tablet (400 mg total) by mouth at bedtime. Patient taking differently: Take 250 mg by mouth at bedtime. 09/12/21   Sharee Holster, NP  metFORMIN (GLUCOPHAGE) 500 MG tablet Take 1 tablet (500 mg total) by mouth 2 (two) times daily with a meal. 09/14/21   Chilton Si, Chong Sicilian, NP  metoprolol succinate (TOPROL-XL) 25 MG 24 hr tablet Take 0.5  tablets (12.5 mg total) by mouth at bedtime. 03/04/23 08/25/24  [name], PA-C  Multiple Vitamins-Minerals (PRESERVISION AREDS 2) CAPS Take 2 capsules by mouth daily with lunch.    [provider]  Naphazoline-Glycerin (CLEAR EYES MAX REDNESS RELIEF) 0.03-0.5 % SOLN Place 1 drop into both eyes 2 (two) times daily as needed (dry/irritated eyes.).    [provider]  [facility] ULTRA test strip  10/03/21   [provider]  potassium chloride SA (KLOR-CON M) 20 MEQ tablet Take 1 tablet (20 mEq total) by mouth in the morning. 08/13/22   Quintella Reichert, MD  rosuvastatin (CRESTOR) 5 MG tablet Take 5 mg by mouth in the morning. 03/26/22   [provider]  torsemide (DEMADEX) 20 MG tablet Take 1 tablet (20 mg total) by mouth 2 (two) times daily. 06/27/22 03/04/23  Joylene Grapes, NP  vitamin B-12 (CYANOCOBALAMIN) 500 MCG tablet Take 500 mcg by mouth in the morning.    [provider]  warfarin (COUMADIN) 5 MG tablet Take 0.5-1 tablets (2.5-5 mg total) by mouth See admin instructions. Take 0.5 tablet (2.5 mg) by mouth at supper on Mondays, Wednesdays & Fridays. Take 1 tablet (5 mg) by mouth at supper on Tuesdays, Thursdays, Saturdays & Sundays. 08/13/22   Quintella Reichert, MD    Physical Exam: Vitals:   05/22/23 0230 05/22/23 0245 05/22/23 0401 05/22/23 0403  BP:  127/89    Pulse: 85 81    Resp: 17 18  18   Temp:    97.9 F (36.6 C)  TempSrc:    Oral  SpO2: 92% 97%    Weight:   124.3 kg   Height:        Constitutional: NAD, calm, comfortable Vitals:   05/22/23 0230 05/22/23 0245 05/22/23 0401 05/22/23 0403  BP:  127/89    Pulse: 85 81    Resp: 17 18  18   Temp:    97.9 F (36.6 C)  TempSrc:    Oral  SpO2: 92% 97%    Weight:   124.3 kg   Height:       Eyes: PERRL, lids and conjunctivae normal ENMT: Mucous membranes are moist. Posterior pharynx clear of any exudate or lesions.Normal dentition.  Neck: normal, supple, no masses, no  thyromegaly Respiratory: clear to auscultation bilaterally, no wheezing, no crackles. Normal respiratory effort. No accessory muscle use.  Cardiovascular: Regular rate and rhythm, no murmurs / rubs / gallops. No extremity edema. 2+ pedal pulses. No carotid bruits.  Abdomen: no tenderness, no masses palpated. No hepatosplenomegaly. Bowel sounds positive.  Musculoskeletal: no clubbing / cyanosis. No joint deformity upper and lower extremities. Good ROM, no contractures. Normal muscle tone.  Skin: no rashes, lesions, ulcers. No induration Neurologic: CN 2-12 grossly intact. Sensation intact, DTR normal. Strength 5/5 in all 4.  Psychiatric: Normal judgment and insight. Alert and oriented x 3. Normal  mood.   Labs on Admission: I have personally reviewed following labs and imaging studies  CBC: Recent Labs  Lab 05/21/23 2346  WBC 4.4  HGB 11.6*  HCT 36.8  MCV 88.7  PLT 144*    Basic Metabolic Panel: Recent Labs  Lab 05/21/23 2346  NA 141  K 3.8  CL 103  CO2 21*  GLUCOSE 161*  BUN 32*  CREATININE 0.72  CALCIUM 9.6   GFR: Estimated Creatinine Clearance: 70.7 mL/min (by C-G formula based on SCr of 0.72 mg/dL).  Liver Function Tests: Recent Labs  Lab 05/21/23 2346  AST 17  ALT 8  ALKPHOS 49  BILITOT 0.3  PROT 7.3  ALBUMIN 4.0    Urine analysis:    Component Value Date/Time   COLORURINE STRAW (A) 04/30/2022 1207   APPEARANCEUR CLEAR 04/30/2022 1207   LABSPEC 1.005 04/30/2022 1207   PHURINE 6.0 04/30/2022 1207   GLUCOSEU NEGATIVE 04/30/2022 1207   HGBUR SMALL (A) 04/30/2022 1207   BILIRUBINUR NEGATIVE 04/30/2022 1207   KETONESUR NEGATIVE 04/30/2022 1207   PROTEINUR NEGATIVE 04/30/2022 1207   UROBILINOGEN 0.2 01/04/2011 0915   NITRITE NEGATIVE 04/30/2022 1207   LEUKOCYTESUR NEGATIVE 04/30/2022 1207    Radiological Exams on Admission: CT Chest Wo Contrast  Result Date: 05/22/2023 CLINICAL DATA:  Pneumonia, dyspnea EXAM: CT CHEST WITHOUT CONTRAST TECHNIQUE:  Multidetector CT imaging of the chest was performed following the standard protocol without IV contrast. RADIATION DOSE REDUCTION: This exam was performed according to the departmental dose-optimization program which includes automated exposure control, adjustment of the mA and/or kV according to patient size and/or use of iterative reconstruction technique. COMPARISON:  09/20/2022 FINDINGS: Cardiovascular: Moderate multi-vessel coronary artery calcification. Global cardiac size within normal limits. No pericardial effusion. Central pulmonary arteries are enlarged in keeping with changes of pulmonary arterial hypertension. Main pulmonary artery measures 3.8 cm in diameter. There is fusiform dilation of the ascending thoracic aorta measuring 4.8 cm in diameter, stable since prior examination. Descending thoracic aorta is of normal caliber. Mediastinum/Nodes: No enlarged mediastinal or axillary lymph nodes. Thyroid gland, trachea, and esophagus demonstrate no significant findings. Lungs/Pleura: There is diffuse bronchial wall thickening present in keeping with airway inflammation. Mild parenchymal scarring within the lingula. Minimal bibasilar subpleural reticulation is present in keeping with mild fibrotic change. No confluent pulmonary infiltrate. No pneumothorax or pleural effusion. Upper Abdomen: Benign right adrenal adenoma is partially visualized, better seen on prior examination of 09/20/2022. No follow-up imaging recommended for this lesion. No acute abnormality. Musculoskeletal: Multiple healed bilateral rib fractures are identified. No acute bone abnormality. No lytic or blastic bone lesion. IMPRESSION: 1. Diffuse bronchial wall thickening in keeping with airway inflammation. 2. Moderate multi-vessel coronary artery calcification. 3. Stable fusiform dilation of the ascending thoracic aorta measuring 4.8 cm in diameter. Ascending thoracic aortic aneurysm. Recommend semi-annual imaging followup by CTA or MRA  and referral to cardiothoracic surgery if not already obtained. This recommendation follows 2010 ACCF/AHA/AATS/ACR/ASA/SCA/SCAI/SIR/STS/SVM Guidelines for the Diagnosis and Management of Patients With Thoracic Aortic Disease. Circulation. 2010; 121: Z610-R604. Aortic aneurysm NOS (ICD10-I71.9) 4. Pulmonary arterial hypertension. 5. Benign right adrenal adenoma. No follow-up imaging recommended. Electronically Signed   By: Helyn Numbers M.D.   On: 05/22/2023 01:42   DG Chest Port 1 View  Result Date: 05/21/2023 CLINICAL DATA:  Dyspnea EXAM: PORTABLE CHEST 1 VIEW COMPARISON:  07/22/2022 FINDINGS: Lungs are clear. No pneumothorax or pleural effusion. Cardiac size is mildly enlarged. Pulmonary vascularity is normal. No acute bone abnormality. IMPRESSION: 1. Mild cardiomegaly.  Electronically Signed   By: Helyn Numbers M.D.   On: 05/21/2023 23:40    EKG: Independently reviewed.  EKG showed normal sinus rhythm at 79, QTc 488  Assessment/Plan Principal Problem:   COVID-19 Active Problems:   Atrial fibrillation with rapid ventricular response (HCC)   Chronic diastolic (congestive) heart failure (HCC)   Type 2 diabetes mellitus without complication, without long-term current use of insulin (HCC)   Essential hypertension   Mixed diabetic hyperlipidemia associated with type 2 diabetes mellitus (HCC)   GERD without esophagitis   Gout   PAF (paroxysmal atrial fibrillation) (HCC)  Acute bronchitis due to COVID-19 virus infection Patient presented to hospital with symptoms of viral infection. CT chest showed no definite pneumonia, but revealed bronchial thickening concerning for acute bronchitis. Plan. Continue supportive care.  Given lack of hypoxia, no indication for steroids, remdesivir. As patient symptoms are improving, no signs of pneumonia on CT chest, no obvious indication for continuation of antibiotics.  Check procalcitonin, CRP.  Paroxysmal atrial fibrillation with rapid ventricular  response It was present in outside ED, however patient currently in normal sinus rhythm.. Plan. Continue Tikosyn and Toprol-XL.  Monitor on cardiac telemetry.  Chronic congestive heart failure with preserved extraction Patient without signs of CHF exacerbation.  Avoid administration of excessive fluid. Continue torsemide.  Hypertension Blood pressure well-controlled.  Continue losartan, Toprol-XL, torsemide.  Diabetes mellitus type 2, non-insulin-dependent Holding metformin.  Continue closely monitor blood glucose, placed on sliding scale coverage with NovoLog.  Hyperlipidemia Continue lipid-lowering therapy with Crestor  Chronic gout Continue allopurinol  DVT prophylaxis:  Warfarin Code Status:        Full code Family Communication:  No  Disposition Plan:   Patient is from:  Home   Anticipated DC to:  Home  Anticipated DC date:  24-48 hrs  Anticipated DC barriers: None  Consults called:  No  Admission status:  Inpatient   Severity of Illness: The appropriate patient status for this patient is INPATIENT. Inpatient status is judged to be reasonable and necessary in order to provide the required intensity of service to ensure the patient's safety. The patient's presenting symptoms, physical exam findings, and initial radiographic and laboratory data in the context of their chronic comorbidities is felt to place them at high risk for further clinical deterioration. Furthermore, it is not anticipated that the patient will be medically stable for discharge from the hospital within 2 midnights of admission.   * I certify that at the point of admission it is my clinical judgment that the patient will require inpatient hospital care spanning beyond 2 midnights from the point of admission due to high intensity of service, high risk for further deterioration and high frequency of surveillance required.Youlanda Roys MD [facility]  How to contact the [facility] Attending or  Consulting provider 7A - 7P or covering provider during after hours 7P -7A, for this patient?   Check the care team in [facility] and look for a) attending/consulting [facility] provider listed and b) the [facility] team listed Log into [email or web address] and use [place]'s universal password to access. If you do not have the password, please contact the hospital operator. Locate the [facility] provider you are looking for under [facility] and page to a number that you can be directly reached. If you still have difficulty reaching the provider, please page the [facility] (Director on Call) for the Hospitalists listed on amion for assistance.  05/22/2023, 5:03 AM

## 2023-05-22 NOTE — Sepsis Progress Note (Signed)
Notified bedside nurse of need to draw repeat lactic acid. 

## 2023-05-22 NOTE — Evaluation (Signed)
Occupational Therapy Evaluation Patient Details Name: Natasha Glass MRN: [id number] DOB: 1941/10/05 Today's Date: 05/22/2023   History of Present Illness Pt is a 82 y.o. female admitted to [facility] on 8/7 after presenting to ED with complaints of shortness of breath. On 8/4, pt presented to [facility] ED with complaints of worsening shortness of breath, cough, congestion since Sunday, 8/4.  Pt was seen by her PCP on 8/5 and diagnosed with COVID-19 virus infection. PMH inlcudes paroxysmal AFib on warfarin, CHF, HTN, DMII, gout, DVT/PE, OSA, PAF, hx of B rotator cuff injuries with sx on Left.   Clinical Impression   At baseline, pt is Independent to Mod I with ADLs, and IADLs, and drives. At baseline, pt performs functional mobility/transfers Mod I with a Rollator. Pt reports he daughter occasionally assists with meal prep. Pt now presents with decreased activity tolerance, decreased balance during functional tasks, and decreased safety and independence with ADLs and functional transfers/mobility. Pt currently demonstrates ability to complete UB ADLs Independent to Min assist, LB ADLs with Mod to Max assist, and functional transfers with a RW with Contact guard assist. Pt with O2 desat into upper 80s in standing and side stepping with RW on RA with pt recovering quickly into the low 90s in sitting on RA. All other VSS throughout session. Pt is motivated to return to PLOF and participated well in session. Pt will benefit from acute skilled OT services to address deficits outlined below and increase safety and independence with ADLs, functional transfers, and functional mobility. Post acute discharge, pt will benefit from continued skilled OT services in the home to maximize rehab potential.       If plan is discharge home, recommend the following: A little help with walking and/or transfers;A lot of help with bathing/dressing/bathroom;Assistance with cooking/housework;Assist for transportation;Help with stairs  or ramp for entrance    Functional Status Assessment  Patient has had a recent decline in their functional status and demonstrates the ability to make significant improvements in function in a reasonable and predictable amount of time.  Equipment Recommendations  None recommended by OT    Recommendations for Other Services       Precautions / Restrictions Precautions Precautions: Fall;Other (comment) Precaution Comments: watch O2 sat Restrictions Weight Bearing Restrictions: No      Mobility Bed Mobility Overal bed mobility: Needs Assistance Bed Mobility: Rolling, Supine to Sit, Sit to Supine Rolling: Contact guard assist   Supine to sit: Supervision Sit to supine: Mod assist   General bed mobility comments: Pt requies Mod assist to manage B LE when transferring sit to supine    Transfers Overall transfer level: Needs assistance Equipment used: Rolling walker (2 wheels) Transfers: Sit to/from Stand Sit to Stand: Contact guard assist, From elevated surface           General transfer comment: Pt required Contact guard assist to side step 3 steps toward [facility] with use of RW.      Balance Overall balance assessment: Needs assistance Sitting-balance support: Single extremity supported, No upper extremity supported, Feet supported Sitting balance-Leahy Scale: Fair     Standing balance support: Bilateral upper extremity supported, During functional activity, Reliant on assistive device for balance Standing balance-Leahy Scale: Poor                             ADL either performed or assessed with clinical judgement   ADL Overall ADL's : Needs assistance/impaired Eating/Feeding: Independent;Sitting  Grooming: Independent;Sitting   Upper Body Bathing: Minimal assistance;Sitting   Lower Body Bathing: Moderate assistance;Cueing for compensatory techniques;Sitting/lateral leans;Sit to/from stand   Upper Body Dressing : Minimal assistance;Cueing for  compensatory techniques;Sitting Upper Body Dressing Details (indicate cue type and reason): assist with managing telemetry cord and lines Lower Body Dressing: Moderate assistance;Cueing for compensatory techniques;Sitting/lateral leans;Sit to/from stand   Toilet Transfer: Contact guard assist;Stand-pivot;BSC/3in1;Rolling walker (2 wheels)   Toileting- Clothing Manipulation and Hygiene: Maximal assistance;Cueing for compensatory techniques;Sit to/from stand;Sitting/lateral lean         General ADL Comments: Functional mobility deferred this session secondary to pt presenting with decreased activity tolerance and decreased O2 sat into high 80s on RA in standing/side stepping with pt recovering quickly into low 90s in sitting. Pt will benefit from use of AE for increased safety and independence with LB ADLs with pt reporting prior use of sock aide and reacher.      Vision Baseline Vision/History: 6 Macular Degeneration;1 Wears glasses;4 Cataracts (Bifocals, B cataract with subsequent surgery) Ability to See in Adequate Light: 0 Adequate Patient Visual Report: No change from baseline       Perception         Praxis         Pertinent Vitals/Pain Pain Assessment Pain Assessment: No/denies pain     Extremity/Trunk Assessment Upper Extremity Assessment Upper Extremity Assessment: Right hand dominant;RUE deficits/detail;LUE deficits/detail;Generalized weakness RUE Deficits / Details: Hx rotator cuff injury with subsequent impaired shoulder ROM currently at baseline. All other ROM WNL. Generalized weakness RUE Sensation: WNL RUE Coordination: WNL LUE Deficits / Details: Generalized weakness. AROM shoulder flexion to approx. 100 degrees as baseline. Hx rotator cuff injury with subsequent surgery. LUE Sensation: WNL LUE Coordination: WNL   Lower Extremity Assessment Lower Extremity Assessment: Defer to PT evaluation;RLE deficits/detail;LLE deficits/detail RLE Deficits / Details:  Edematous B LE LLE Deficits / Details: Edematous B LE       Communication Communication Communication: No apparent difficulties   Cognition Arousal: Alert Behavior During Therapy: WFL for tasks assessed/performed Overall Cognitive Status: Within Functional Limits for tasks assessed                                 General Comments: AAOx4 and pleasant throughout session. Pt presents wtih ability to follow multi-step commands consistantly and with good safety awareness.     General Comments  On RA, pt with O2 desat into upper 80s in standing/side stepping and recovering quickly to low 90s upon sitting. All other VSS on RA throughout session.    Exercises     Shoulder Instructions      Home Living Family/patient expects to be discharged to:: Private residence Living Arrangements: Alone Available Help at Discharge: Friend(s);Family;Available PRN/intermittently;Neighbor (Family could stay 24 hours a day from a few days if needed.) Type of Home: House Home Access: Stairs to enter;Level entry [employer] of Steps: 2 stairs at front door, 1 step at deck, and level entry from garage Entrance Stairs-Rails: Left (at deck, which is door she uses most) Home Layout: One level     Bathroom Shower/Tub: Producer, television/film/video: Handicapped height     Home Equipment: Shower seat - built in;Grab bars - tub/shower;Grab bars - toilet;Rollator (4 wheels);Rolling Walker (2 wheels);BSC/3in1;Lift chair;Hospital bed;Wheelchair - manual;Hand held shower head;Adaptive equipment (Usually sleeps in lift chair; has a sock aide and reacher)  Prior Functioning/Environment Prior Level of Function : Independent/Modified Independent             Mobility Comments: Uses a Rollator ADLs Comments: Independent to Mod I with ADLs and IADLs; Daughtter often assists with meal prep.        OT Problem List: Decreased strength;Decreased activity tolerance;Impaired  balance (sitting and/or standing);Decreased knowledge of use of DME or AE;Cardiopulmonary status limiting activity;Obesity      OT Treatment/Interventions: Pippen-care/ADL training;Therapeutic exercise;Energy conservation;DME and/or AE instruction;Therapeutic activities;Patient/family education;Balance training    OT Goals(Current goals can be found in the care plan section) Acute Rehab OT Goals Patient Stated Goal: To return home, feel better, and be independent OT Goal Formulation: With patient Time For Goal Achievement: 06/05/23 Potential to Achieve Goals: Good ADL Goals Pt Will Perform Lower Body Bathing: with adaptive equipment;sitting/lateral leans;sit to/from stand;with contact guard assist Pt Will Perform Lower Body Dressing: with adaptive equipment;sitting/lateral leans;sit to/from stand;with contact guard assist Pt Will Transfer to Toilet: with modified independence;ambulating;bedside commode (with least restrictive AD) Pt Will Perform Toileting - Clothing Manipulation and hygiene: sitting/lateral leans;sit to/from stand;with min assist Pt/caregiver will Perform Home Exercise Program: Increased strength;Both right and left upper extremity;With theraband;With theraputty;With Supervision;With written HEP provided (Increased activity tolerance) Additional ADL Goal #1: Patient will demonstrate ability to Independently state 4 energy conservation techniques to increase safety and independence with ADLs and functional mobility in the home.  OT Frequency: Min 1X/week    Co-evaluation              AM-PAC OT "6 Clicks" Daily Activity     Outcome Measure Help from another person eating meals?: None Help from another person taking care of personal grooming?: None (in sitting) Help from another person toileting, which includes using toliet, bedpan, or urinal?: A Lot Help from another person bathing (including washing, rinsing, drying)?: A Lot Help from another person to put on and taking  off regular upper body clothing?: A Little Help from another person to put on and taking off regular lower body clothing?: A Lot 6 Click Score: 17   End of Session Equipment Utilized During Treatment: Gait belt;Rolling walker (2 wheels) Nurse Communication: Mobility status;Other (comment) (Pt with O2 desat into high 80s on RA in standing and side stepping with quick recovery into low 90s in stting on RA)  Activity Tolerance: Patient tolerated treatment well;Treatment limited secondary to medical complications (Comment) (Decreased O2 sat in standing/side stepping) Patient left: in bed;with call bell/phone within reach;with bed alarm set  OT Visit Diagnosis: Muscle weakness (generalized) (M62.81);Other (comment) (Decreased activity tolerance)                Time: 4098-1191 OT Time Calculation (min): 44 min Charges:  OT General Charges $OT Visit: 1 Visit OT Evaluation $OT Eval Moderate Complexity: 1 Mod OT Treatments $Vader Care/Home Management : 23-37 mins   "Orson Eva., OTR/L, MA Acute Rehab [phone]   Lendon Colonel 05/22/2023, 2:01 PM

## 2023-05-22 NOTE — Progress Notes (Signed)
PROGRESS NOTE        PATIENT DETAILS Name: Natasha Glass Age: 82 y.o. Sex: female Date of Birth: 1941-05-09 Admit Date: 05/21/2023 Admitting Physician Youlanda Roys, MD PCP:[facility]  Brief Summary: Patient is a 82 y.o.  female with history of PAF-who started having cough/low-grade fever on 8/4-subsequently got diagnosed with COVID-19 infection at [facility] urgent care on 8/6-started on molnupiravir-developed tachycardia/weakness-presented to the ED on 8/7-she was found to have A-fib with RVR-in subsequently admitted to the hospitalist service.  Significant events: 8/7>> admit to [facility]  Significant studies: 8/8>> CT chest: Diffuse bronchial wall thickening-airway inflammation.  Significant microbiology data: 8/7>> COVID PCR: Positive 8/7>> blood cultures: Pending  Procedures: None  Consults: None  Subjective: Lying comfortably in bed-denies any chest pain or shortness of breath.  Objective: Vitals: Blood pressure 127/89, pulse 81, temperature 97.9 F (36.6 C), temperature source Oral, resp. rate 18, height 5\' 3"  (1.6 m), weight 124.3 kg, SpO2 97%.   Exam: Gen Exam:Alert awake-not in any distress HEENT:atraumatic, normocephalic Chest: B/L clear to auscultation anteriorly CVS:S1S2 regular Abdomen:soft non tender, non distended Extremities:no edema Neurology: Non focal Skin: no rash  Pertinent Labs/Radiology:    Latest Ref Rng & Units 05/21/2023   11:46 PM 08/06/2022   10:23 AM 05/28/2022    7:29 PM  CBC  WBC 4.0 - 10.5 K/uL 4.4  9.2  8.4   Hemoglobin 12.0 - 15.0 g/dL 16.1  09.6  04.5   Hematocrit 36.0 - 46.0 % 36.8  40.6  31.9   Platelets 150 - 400 K/uL 144  193  265     Lab Results  Component Value Date   NA 141 05/21/2023   K 3.8 05/21/2023   CL 103 05/21/2023   CO2 21 (L) 05/21/2023      Assessment/Plan: PAF with RVR Brief episode of A-fib on initial presentation to the ED-likely provoked by COVID-19  infection Currently maintaining sinus rhythm Continue Tikosyn/metoprolol INR therapeutic-Coumadin per pharmacy Telemetry monitoring  COVID-19 infection Acute bronchitis Not hypoxic No parenchymal involvement on CT chest Do not think she requires IV antibiotics Supportive care with bronchodilators/antitussives. Mobilize with PT/OT and see how she does  Chronic HFpEF Euvolemic Torsemide  HTN Stable Losartan/beta-blocker/torsemide  HLD Statin  DM-2 (A1c 6.5 on 8/8) CBG stable with SSI Resume oral hypoglycemics on discharge.  4.8 cm ascending aortic aneurysm Radiology recommending semiannual CTA or MRA-defer to PCP  Benign right adrenal adenoma Seen incidentally on CT chest-no further follow-up imaging recommended by radiology.  Debility/deconditioning Uses a walker at baseline Lives alone PT/OT eval  Morbid Obesity: Estimated body mass index is 48.54 kg/m as calculated from the following:   Height as of this encounter: 5\' 3"  (1.6 m).   Weight as of this encounter: 124.3 kg.   Code status:   Code Status: Full Code   DVT Prophylaxis: Coumadin   Family Communication: None at bedside   Disposition Plan: Status is: Inpatient Remains inpatient appropriate because: Severity of illness   Planned Discharge Destination:Home   Diet: Diet Order             Diet heart healthy/carb modified Room service appropriate? Yes; Fluid consistency: Thin  Diet effective now                     Antimicrobial agents: Anti-infectives (From admission, onward)  Start     Dose/Rate Route Frequency Ordered Stop   05/22/23 0045  cefTRIAXone (ROCEPHIN) 2 g in sodium chloride 0.9 % 100 mL IVPB        2 g 200 mL/hr over 30 Minutes Intravenous Every 24 hours 05/22/23 0032 05/27/23 0044   05/22/23 0045  azithromycin (ZITHROMAX) 500 mg in sodium chloride 0.9 % 250 mL IVPB        500 mg 250 mL/hr over 60 Minutes Intravenous Every 24 hours 05/22/23 0032 05/27/23 0044         MEDICATIONS: Scheduled Meds:  allopurinol  150 mg Oral QPM   cyanocobalamin  500 mcg Oral Daily   docusate sodium  100 mg Oral BID   dofetilide  250 mcg Oral BID   insulin aspart  0-9 Units Subcutaneous TID WC   losartan  25 mg Oral Daily   magnesium oxide  400 mg Oral Daily   metoprolol succinate  12.5 mg Oral QHS   potassium chloride SA  20 mEq Oral Daily   rosuvastatin  5 mg Oral Daily   torsemide  20 mg Oral BID   Warfarin - Pharmacist Dosing Inpatient   Does not apply q1600   Continuous Infusions:  azithromycin Stopped (05/22/23 0246)   cefTRIAXone (ROCEPHIN)  IV Stopped (05/22/23 0132)   PRN Meds:.acetaminophen **OR** acetaminophen, albuterol, HYDROcodone-acetaminophen, ondansetron **OR** ondansetron (ZOFRAN) IV   I have personally reviewed following labs and imaging studies  LABORATORY DATA: CBC: Recent Labs  Lab 05/21/23 2346  WBC 4.4  HGB 11.6*  HCT 36.8  MCV 88.7  PLT 144*    Basic Metabolic Panel: Recent Labs  Lab 05/21/23 2346 05/22/23 0831  NA 141  --   K 3.8  --   CL 103  --   CO2 21*  --   GLUCOSE 161*  --   BUN 32*  --   CREATININE 0.72  --   CALCIUM 9.6  --   MG  --  1.8    GFR: Estimated Creatinine Clearance: 70.7 mL/min (by C-G formula based on SCr of 0.72 mg/dL).  Liver Function Tests: Recent Labs  Lab 05/21/23 2346  AST 17  ALT 8  ALKPHOS 49  BILITOT 0.3  PROT 7.3  ALBUMIN 4.0   No results for input(s): "LIPASE", "AMYLASE" in the last 168 hours. No results for input(s): "AMMONIA" in the last 168 hours.  Coagulation Profile: Recent Labs  Lab 05/21/23 2346 05/22/23 0831  INR 3.2* 3.6*    Cardiac Enzymes: No results for input(s): "CKTOTAL", "CKMB", "CKMBINDEX", "TROPONINI" in the last 168 hours.  BNP (last 3 results) No results for input(s): "PROBNP" in the last 8760 hours.  Lipid Profile: No results for input(s): "CHOL", "HDL", "LDLCALC", "TRIG", "CHOLHDL", "LDLDIRECT" in the last 72 hours.  Thyroid  Function Tests: No results for input(s): "TSH", "T4TOTAL", "FREET4", "T3FREE", "THYROIDAB" in the last 72 hours.  Anemia Panel: No results for input(s): "VITAMINB12", "FOLATE", "FERRITIN", "TIBC", "IRON", "RETICCTPCT" in the last 72 hours.  Urine analysis:    Component Value Date/Time   COLORURINE STRAW (A) 04/30/2022 1207   APPEARANCEUR CLEAR 04/30/2022 1207   LABSPEC 1.005 04/30/2022 1207   PHURINE 6.0 04/30/2022 1207   GLUCOSEU NEGATIVE 04/30/2022 1207   HGBUR SMALL (A) 04/30/2022 1207   BILIRUBINUR NEGATIVE 04/30/2022 1207   KETONESUR NEGATIVE 04/30/2022 1207   PROTEINUR NEGATIVE 04/30/2022 1207   UROBILINOGEN 0.2 01/04/2011 0915   NITRITE NEGATIVE 04/30/2022 1207   LEUKOCYTESUR NEGATIVE 04/30/2022 1207    Sepsis  Labs: Lactic Acid, Venous    Component Value Date/Time   LATICACIDVEN 3.2 (HH) 05/21/2023 2342    MICROBIOLOGY: Recent Results (from the past 240 hour(s))  SARS Coronavirus 2 by RT PCR (hospital order, performed in [facility] hospital lab) *cepheid single result test* Anterior Nasal Swab     Status: Abnormal   Collection Time: 05/21/23 11:46 PM   Specimen: Anterior Nasal Swab  Result Value Ref Range Status   SARS Coronavirus 2 by RT PCR POSITIVE (A) NEGATIVE Final    Comment: (NOTE) SARS-CoV-2 target nucleic acids are DETECTED  SARS-CoV-2 RNA is generally detectable in upper respiratory specimens  during the acute phase of infection.  Positive results are indicative  of the presence of the identified virus, but do not rule out bacterial infection or co-infection with other pathogens not detected by the test.  Clinical correlation with patient history and  other diagnostic information is necessary to determine patient infection status.  The expected result is negative.  Fact Sheet for Patients:   RoadLapTop.co.za   Fact Sheet for Healthcare Providers:   http://kim-miller.com/    This test is not yet  approved or cleared by the [place] FDA and  has been authorized for detection and/or diagnosis of SARS-CoV-2 by FDA under an Emergency Use Authorization (EUA).  This EUA will remain in effect (meaning this test can be used) for the duration of  the COVID-19 declaration under Section 564(b)(1)  of the Act, 21 U.S.C. section 360-bbb-3(b)(1), unless the authorization is terminated or revoked sooner.   Performed at [facility], [address], [place], [place] [address]     RADIOLOGY STUDIES/RESULTS: CT Chest Wo Contrast  Result Date: 05/22/2023 CLINICAL DATA:  Pneumonia, dyspnea EXAM: CT CHEST WITHOUT CONTRAST TECHNIQUE: Multidetector CT imaging of the chest was performed following the standard protocol without IV contrast. RADIATION DOSE REDUCTION: This exam was performed according to the departmental dose-optimization program which includes automated exposure control, adjustment of the mA and/or kV according to patient size and/or use of iterative reconstruction technique. COMPARISON:  09/20/2022 FINDINGS: Cardiovascular: Moderate multi-vessel coronary artery calcification. Global cardiac size within normal limits. No pericardial effusion. Central pulmonary arteries are enlarged in keeping with changes of pulmonary arterial hypertension. Main pulmonary artery measures 3.8 cm in diameter. There is fusiform dilation of the ascending thoracic aorta measuring 4.8 cm in diameter, stable since prior examination. Descending thoracic aorta is of normal caliber. Mediastinum/Nodes: No enlarged mediastinal or axillary lymph nodes. Thyroid gland, trachea, and esophagus demonstrate no significant findings. Lungs/Pleura: There is diffuse bronchial wall thickening present in keeping with airway inflammation. Mild parenchymal scarring within the lingula. Minimal bibasilar subpleural reticulation is present in keeping with mild fibrotic change. No confluent pulmonary infiltrate. No  pneumothorax or pleural effusion. Upper Abdomen: Benign right adrenal adenoma is partially visualized, better seen on prior examination of 09/20/2022. No follow-up imaging recommended for this lesion. No acute abnormality. Musculoskeletal: Multiple healed bilateral rib fractures are identified. No acute bone abnormality. No lytic or blastic bone lesion. IMPRESSION: 1. Diffuse bronchial wall thickening in keeping with airway inflammation. 2. Moderate multi-vessel coronary artery calcification. 3. Stable fusiform dilation of the ascending thoracic aorta measuring 4.8 cm in diameter. Ascending thoracic aortic aneurysm. Recommend semi-annual imaging followup by CTA or MRA and referral to cardiothoracic surgery if not already obtained. This recommendation follows 2010 ACCF/AHA/AATS/ACR/ASA/SCA/SCAI/SIR/STS/SVM Guidelines for the Diagnosis and Management of Patients With Thoracic Aortic Disease. Circulation. 2010; 121: U045-W098. Aortic aneurysm NOS (ICD10-I71.9) 4. Pulmonary arterial hypertension. 5.  Benign right adrenal adenoma. No follow-up imaging recommended. Electronically Signed   By: Helyn Numbers M.D.   On: 05/22/2023 01:42   DG Chest Port 1 View  Result Date: 05/21/2023 CLINICAL DATA:  Dyspnea EXAM: PORTABLE CHEST 1 VIEW COMPARISON:  07/22/2022 FINDINGS: Lungs are clear. No pneumothorax or pleural effusion. Cardiac size is mildly enlarged. Pulmonary vascularity is normal. No acute bone abnormality. IMPRESSION: 1. Mild cardiomegaly. Electronically Signed   By: Helyn Numbers M.D.   On: 05/21/2023 23:40     LOS: 0 days   Jeoffrey Massed, MD  [facility]    To contact the attending provider between 7A-7P or the covering provider during after hours 7P-7A, please log into the web site [email or web address] and access using universal [place] password for that web site. If you do not have the password, please call the hospital operator.  05/22/2023, 9:51 AM

## 2023-05-22 NOTE — Care Management Obs Status (Signed)
MEDICARE OBSERVATION STATUS NOTIFICATION   Patient Details  Name: ROBBYN NYLIN MRN: [id number] Date of Birth: 05-24-1941   Medicare Observation Status Notification Given:  Yes    Gordy Clement, RN 05/22/2023, 12:39 PM

## 2023-05-22 NOTE — Progress Notes (Signed)
ANTICOAGULATION CONSULT NOTE - Initial Consult  Pharmacy Consult for Coumadin Indication: atrial fibrillation  Allergies  Allergen Reactions   Bactrim [Sulfamethoxazole-Trimethoprim]     Drop in BP   Morphine And Codeine Other (See Comments)    Unresponsive-Very bad reaction.  Can take hydrocodone.    Oysters [Shellfish Allergy] Shortness Of Breath, Itching, Swelling and Rash   Amlodipine Swelling and Other (See Comments)    Of legs   Percocet [Oxycodone-Acetaminophen] Other (See Comments)    hallucinations   Tdap [Tetanus-Diphth-Acell Pertussis] Itching, Swelling and Rash   Tetanus Toxoids Itching, Swelling and Rash   Tramadol Swelling and Other (See Comments)    Of legs   Lactose Intolerance (Gi) Other (See Comments)   Other Other (See Comments)   Tegretol [Carbamazepine] Other (See Comments)    unknown   Tetanus-Diphtheria Toxoids Td Other (See Comments)   Topiramate Other (See Comments)   Diovan [Valsartan] Other (See Comments)    Cough   Diphtheria Toxoid Itching, Swelling and Rash   Macrodantin [Nitrofurantoin Macrocrystal] Other (See Comments)    unspecified    Patient Measurements: Height: 5\' 3"  (160 cm) Weight: 124.3 kg (274 lb 0.5 oz) IBW/kg (Calculated) : 52.4  Vital Signs: Temp: 97.9 F (36.6 C) (08/08 0403) Temp Source: Oral (08/08 0403) BP: 127/89 (08/08 0245) Pulse Rate: 81 (08/08 0245)  Labs: Recent Labs    05/21/23 2346  HGB 11.6*  HCT 36.8  PLT 144*  LABPROT 32.9*  INR 3.2*  CREATININE 0.72  TROPONINIHS 8    Estimated Creatinine Clearance: 70.7 mL/min (by C-G formula based on SCr of 0.72 mg/dL).   Medical History: Past Medical History:  Diagnosis Date   Anemia    Arthritis    CHF (congestive heart failure) (HCC)    DVT (deep venous thrombosis) (HCC) 01/2009   Dysrhythmia    GERD (gastroesophageal reflux disease)    Gout    History of blood transfusion    1990's   History of colonic polyps 09/2010   History of pneumonia     Hyperlipidemia    Hypertension    Lactose intolerance    Macular degeneration, dry    bilateral   Migraine    Obesity    Osteopenia    PAF (paroxysmal atrial fibrillation) (HCC)    Peripheral neuropathy    Pulmonary embolism (HCC) 01/2009   Sleep apnea    CPAP pressure 4.0-16.0   Thoracic ascending aortic aneurysm (HCC)    Type II diabetes mellitus (HCC)    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      allopurinol (ZYLOPRIM) 300 MG tablet Take 0.5 tablets (150 mg total) by mouth every evening. 15 tablet 0    Cholecalciferol (VITAMIN D3) 25 MCG (1000 UT) CAPS Take 2,000 Units by mouth in the morning.      clobetasol cream (TEMOVATE) 0.05 % Apply 1 application topically as needed. (Patient taking differently: Apply 1 application  topically daily as needed (skin irritation (legs)).) 30 g 0    dofetilide (TIKOSYN) 250 MCG capsule Take 1 capsule (250 mcg total) by mouth 2 (two) times daily. 180 capsule 2    HYDROcodone-acetaminophen (NORCO/VICODIN) 5-325 MG tablet Take 0.5 tablets by mouth daily as needed (pain.).      Lancet Devices [facility]  PLUS LANCING) MISC       Lancets (ONETOUCH DELICA PLUS LANCET33G) MISC Apply 1 each topically 3 (three) times daily.      losartan (COZAAR) 25 MG tablet Take 1 tablet (25 mg total) by mouth daily. 30 tablet 0    magnesium oxide (MAG-OX) 400 MG tablet Take 1 tablet (400 mg total) by mouth at bedtime. (Patient taking differently: Take 250 mg by mouth at bedtime.) 30 tablet 0    metFORMIN (GLUCOPHAGE) 500 MG tablet Take 1 tablet (500 mg total) by mouth 2 (two) times daily with a meal. 60 tablet 0    metoprolol succinate (TOPROL-XL) 25 MG 24 hr tablet Take 0.5 tablets (12.5 mg total) by mouth at bedtime. 45 tablet 1     Multiple Vitamins-Minerals (PRESERVISION AREDS 2) CAPS Take 2 capsules by mouth daily with lunch.      Naphazoline-Glycerin (CLEAR EYES MAX REDNESS RELIEF) 0.03-0.5 % SOLN Place 1 drop into both eyes 2 (two) times daily as needed (dry/irritated eyes.).      ONETOUCH ULTRA test strip       potassium chloride SA (KLOR-CON M) 20 MEQ tablet Take 1 tablet (20 mEq total) by mouth in the morning.      rosuvastatin (CRESTOR) 5 MG tablet Take 5 mg by mouth in the morning.      torsemide (DEMADEX) 20 MG tablet Take 1 tablet (20 mg total) by mouth 2 (two) times daily. 180 tablet 3    vitamin B-12 (CYANOCOBALAMIN) 500 MCG tablet Take 500 mcg by mouth in the morning.      warfarin (COUMADIN) 5 MG tablet Take 0.5-1 tablets (2.5-5 mg total) by mouth See admin instructions. Take 0.5 tablet (2.5 mg) by mouth at supper on Mondays, Wednesdays & Fridays. Take 1 tablet (5 mg) by mouth at supper on Tuesdays, Thursdays, Saturdays & Sundays.       Assessment: 82 y.o. female admitted with SOB, h/o Afib and VTE to continue Coumadin  Goal of Therapy:  INR 2-3 Monitor platelets by anticoagulation protocol: Yes   Plan:  F/U current home regimen Daily INR  Eddie Candle 05/22/2023,6:05 AM

## 2023-05-22 NOTE — Plan of Care (Signed)
  Problem: Education: Goal: Knowledge of General Education information will improve Description: Including pain rating scale, medication(s)/side effects and non-pharmacologic comfort measures 05/22/2023 0836 by Luther Redo, RN Outcome: Not Progressing 05/22/2023 0537 by Luther Redo, RN Outcome: Progressing

## 2023-05-22 NOTE — Progress Notes (Signed)
Hospitalist Transfer Note:    Nursing staff, Please call [facility] Admits & Consults System-Wide number on Amion [phone]) as soon as patient's arrival, so appropriate admitting provider can evaluate the pt.   Transferring facility: [facility] Requesting provider: Dr. Bebe Shaggy (EDP at [facility]) Reason for transfer: admission for further evaluation and management of COVID-19 infection.     82 year old female with history of paroxysmal atrial fibrillation chronically anticoagulated on warfarin, chronic diastolic heart failure, who presented to [facility] ED complaining of worsening cough, shortness of breath, congestion, all of which started on Sunday, 05/18/2023.   In the setting of the symptoms, she presented to the office of her PCP on Monday, 05/19/2023, at which time she was diagnosed with COVID-19 infection.  She was started on doxycycline, molnupiravir, and prednisone, but is noted further progression in the above symptoms in the interval, prompting her presentation to [facility] this evening.  In the interval, she is also noted new onset palpitations, in the absence of any associated chest pain.  Vital signs in the ED were notable for the following: Afebrile; systolic blood pressures in the low 100s to 130s; respiratory rate 17-23.  Additionally, her oxygen saturation has been in the range of 92 to 97% on room air.  She is also noted to be in atrial fibrillation, with initial RVR, and initial heart rates into the 130s, which has been improving with IV fluids, with most recent heart rates documented to be in the 70s to 80s, in the absence of any interval AV nodal blocking intervention.  Labs were notable for elevated lactate of 3.2 D-dimer was not elevated.  CT chest showed bronchial inflammation, but otherwise no evidence of acute cardiopulmonary process, including no evidence of infiltrate or edema.   Subsequently, I accepted this patient for transfer for inpatient admission to a pcu bed at [facility] for further  work-up and management of the above. It is noted that there are currently no beds available at [facility].      Newton Pigg, DO Hospitalist

## 2023-05-22 NOTE — Progress Notes (Signed)
ANTICOAGULATION CONSULT NOTE - Follow -Up Consult  Pharmacy Consult for Coumadin Indication: atrial fibrillation  Allergies  Allergen Reactions   Bactrim [Sulfamethoxazole-Trimethoprim]     Drop in BP   Morphine And Codeine Other (See Comments)    Unresponsive-Very bad reaction.  Can take hydrocodone.    Oysters [Shellfish Allergy] Shortness Of Breath, Itching, Swelling and Rash   Amlodipine Swelling and Other (See Comments)    Of legs   Percocet [Oxycodone-Acetaminophen] Other (See Comments)    hallucinations   Tdap [Tetanus-Diphth-Acell Pertussis] Itching, Swelling and Rash   Tetanus Toxoids Itching, Swelling and Rash   Tramadol Swelling and Other (See Comments)    Of legs   Lactose Intolerance (Gi) Other (See Comments)   Other Other (See Comments)   Tegretol [Carbamazepine] Other (See Comments)    unknown   Tetanus-Diphtheria Toxoids Td Other (See Comments)   Topiramate Other (See Comments)   Diovan [Valsartan] Other (See Comments)    Cough   Diphtheria Toxoid Itching, Swelling and Rash   Macrodantin [Nitrofurantoin Macrocrystal] Other (See Comments)    unspecified    Patient Measurements: Height: 5\' 3"  (160 cm) Weight: 124.3 kg (274 lb 0.5 oz) IBW/kg (Calculated) : 52.4  Vital Signs: Temp: 97.9 F (36.6 C) (08/08 0403) Temp Source: Oral (08/08 0858) BP: 127/89 (08/08 0245) Pulse Rate: 81 (08/08 0245)  Labs: Recent Labs    05/21/23 2346 05/22/23 0831  HGB 11.6*  --   HCT 36.8  --   PLT 144*  --   LABPROT 32.9* 36.4*  INR 3.2* 3.6*  CREATININE 0.72  --   TROPONINIHS 8  --     Estimated Creatinine Clearance: 70.7 mL/min (by C-G formula based on SCr of 0.72 mg/dL).   Medical History: Past Medical History:  Diagnosis Date   Anemia    Arthritis    CHF (congestive heart failure) (HCC)    DVT (deep venous thrombosis) (HCC) 01/2009   Dysrhythmia    GERD (gastroesophageal reflux disease)    Gout    History of blood transfusion    1990's   History of  colonic polyps 09/2010   History of pneumonia    Hyperlipidemia    Hypertension    Lactose intolerance    Macular degeneration, dry    bilateral   Migraine    Obesity    Osteopenia    PAF (paroxysmal atrial fibrillation) (HCC)    Peripheral neuropathy    Pulmonary embolism (HCC) 01/2009   Sleep apnea    CPAP pressure 4.0-16.0   Thoracic ascending aortic aneurysm (HCC)    Type II diabetes mellitus (HCC)    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      allopurinol (ZYLOPRIM) 300 MG tablet Take 0.5 tablets (150 mg total) by mouth every evening. 15 tablet 0    Cholecalciferol (VITAMIN D3) 25 MCG (1000 UT) CAPS Take 2,000 Units by mouth in the morning.      clobetasol cream (TEMOVATE) 0.05 % Apply 1 application topically as needed. (Patient taking differently: Apply 1 application  topically daily as needed (skin irritation (legs)).) 30 g 0    dofetilide (TIKOSYN) 250 MCG capsule Take 1 capsule (250 mcg total) by mouth 2 (two) times daily. 180 capsule 2    HYDROcodone-acetaminophen (NORCO/VICODIN) 5-325  MG tablet Take 0.5 tablets by mouth daily as needed (pain.).      Lancet Devices (ONETOUCH DELICA PLUS LANCING) MISC       Lancets (ONETOUCH DELICA PLUS LANCET33G) MISC Apply 1 each topically 3 (three) times daily.      losartan (COZAAR) 25 MG tablet Take 1 tablet (25 mg total) by mouth daily. 30 tablet 0    magnesium oxide (MAG-OX) 400 MG tablet Take 1 tablet (400 mg total) by mouth at bedtime. (Patient taking differently: Take 250 mg by mouth at bedtime.) 30 tablet 0    metFORMIN (GLUCOPHAGE) 500 MG tablet Take 1 tablet (500 mg total) by mouth 2 (two) times daily with a meal. 60 tablet 0    metoprolol succinate (TOPROL-XL) 25 MG 24 hr tablet Take 0.5 tablets  (12.5 mg total) by mouth at bedtime. 45 tablet 1    Multiple Vitamins-Minerals (PRESERVISION AREDS 2) CAPS Take 2 capsules by mouth daily with lunch.      Naphazoline-Glycerin (CLEAR EYES MAX REDNESS RELIEF) 0.03-0.5 % SOLN Place 1 drop into both eyes 2 (two) times daily as needed (dry/irritated eyes.).      ONETOUCH ULTRA test strip       potassium chloride SA (KLOR-CON M) 20 MEQ tablet Take 1 tablet (20 mEq total) by mouth in the morning.      rosuvastatin (CRESTOR) 5 MG tablet Take 5 mg by mouth in the morning.      torsemide (DEMADEX) 20 MG tablet Take 1 tablet (20 mg total) by mouth 2 (two) times daily. 180 tablet 3    vitamin B-12 (CYANOCOBALAMIN) 500 MCG tablet Take 500 mcg by mouth in the morning.      warfarin (COUMADIN) 5 MG tablet Take 0.5-1 tablets (2.5-5 mg total) by mouth See admin instructions. Take 0.5 tablet (2.5 mg) by mouth at supper on Mondays, Wednesdays & Fridays. Take 1 tablet (5 mg) by mouth at supper on Tuesdays, Thursdays, Saturdays & Sundays.       Assessment: 82 y.o. female admitted with SOB due to COVID infection. She is on warfarin PTA for h/o Afib and VTE. Pharmacy consulted to continue warfarin while inpatient.   PTA dosing: Warfarin 2.5mg  M/W/F, 5mg  AOD. INR elevated at 3.2 and 3.6 on admission.   On admission, hgb 11.6, no bleeding noted. With elevated INR and azithromycin administration, plan to hold warfarin today.  Goal of Therapy:  INR 2-3 Monitor platelets by anticoagulation protocol: Yes   Plan:  Hold warfarin x1 dose Monitor daily INR, CBC and s/s of bleeding    05/22/2023,9:28 AM

## 2023-05-22 NOTE — Progress Notes (Signed)
Peak Flow Spirometry testing performed on patient.  With instructions and plenty of coaching the patient was able to to produce her 2 best attempts which were both 110 Lpm.

## 2023-05-23 ENCOUNTER — Other Ambulatory Visit (HOSPITAL_COMMUNITY): Payer: Self-pay

## 2023-05-23 DIAGNOSIS — I5032 Chronic diastolic (congestive) heart failure: Secondary | ICD-10-CM | POA: Diagnosis not present

## 2023-05-23 DIAGNOSIS — I4891 Unspecified atrial fibrillation: Secondary | ICD-10-CM | POA: Diagnosis not present

## 2023-05-23 DIAGNOSIS — U071 COVID-19: Secondary | ICD-10-CM | POA: Diagnosis not present

## 2023-05-23 DIAGNOSIS — E119 Type 2 diabetes mellitus without complications: Secondary | ICD-10-CM | POA: Diagnosis not present

## 2023-05-23 LAB — GLUCOSE, CAPILLARY: Glucose-Capillary: 92 mg/dL (ref 70–99)

## 2023-05-23 LAB — PROTIME-INR
INR: 3.6 — ABNORMAL HIGH (ref 0.8–1.2)
Prothrombin Time: 36.1 seconds — ABNORMAL HIGH (ref 11.4–15.2)

## 2023-05-23 MED ORDER — ALBUTEROL SULFATE HFA 108 (90 BASE) MCG/ACT IN AERS
2.0000 | INHALATION_SPRAY | Freq: Four times a day (QID) | RESPIRATORY_TRACT | 0 refills | Status: AC | PRN
  Filled 2023-05-23: qty 6.7, 15d supply, fill #0

## 2023-05-23 MED ORDER — HYDROCOD POLI-CHLORPHE POLI ER 10-8 MG/5ML PO SUER
5.0000 mL | Freq: Two times a day (BID) | ORAL | 0 refills | Status: DC | PRN
  Filled 2023-05-23: qty 70, 7d supply, fill #0

## 2023-05-23 MED ORDER — WARFARIN SODIUM 5 MG PO TABS: 2.5000 mg | ORAL_TABLET | ORAL | Status: DC

## 2023-05-23 MED ORDER — BENZONATATE 100 MG PO CAPS
100.0000 mg | ORAL_CAPSULE | Freq: Three times a day (TID) | ORAL | 0 refills | Status: DC | PRN
  Filled 2023-05-23: qty 30, 10d supply, fill #0

## 2023-05-23 MED ORDER — PREDNISONE 20 MG PO TABS
40.0000 mg | ORAL_TABLET | Freq: Every day | ORAL | Status: DC
  Administered 2023-05-23: 40 mg via ORAL
  Filled 2023-05-23: qty 2

## 2023-05-23 MED ORDER — DEXAMETHASONE 4 MG PO TABS: 6.0000 mg | ORAL_TABLET | Freq: Every day | ORAL | Status: DC

## 2023-05-23 MED ORDER — DEXTROMETHORPHAN-GUAIFENESIN 10-100 MG/5ML PO SYRP
10.0000 mL | ORAL_SOLUTION | ORAL | 0 refills | Status: DC | PRN
  Filled 2023-05-23: qty 237, 4d supply, fill #0

## 2023-05-23 NOTE — Plan of Care (Signed)

## 2023-05-23 NOTE — Discharge Summary (Addendum)
PATIENT DETAILS Name: Natasha Glass Age: 82 y.o. Sex: female Date of Birth: [birth year] MRN: [id number]. Admitting Physician: Maretta Bees, MD [id number], Malva Cogan, PA  Admit Date: 05/21/2023 Discharge date: 05/23/2023  Recommendations for Outpatient Follow-up:  Follow up with PCP in 1-2 weeks Please obtain CMP/CBC in one week Incident finding-4.8 cm ascending aortic aneurysm-semiannual CTA or MRA recommended-defer to PCP.  Admitted From:  Home  Disposition: Home   Discharge Condition: good  CODE STATUS:   Code Status: Full Code   Diet recommendation:  Diet Order             Diet - low sodium heart healthy           Diet Carb Modified           Diet heart healthy/carb modified Room service appropriate? Yes; Fluid consistency: Thin  Diet effective now                    Brief Summary: Patient is a 82 y.o.  female with history of PAF-who started having cough/low-grade fever on 8/4-subsequently got diagnosed with COVID-19 infection at [facility] urgent care on 8/6-started on molnupiravir-developed tachycardia/weakness-presented to the ED on 8/7-she was found to have A-fib with RVR-in subsequently admitted to the hospitalist service.   Significant events: 8/7>> admit to [facility]   Significant studies: 8/8>> CT chest: Diffuse bronchial wall thickening-airway inflammation.   Significant microbiology data: 8/7>> COVID PCR: Positive 8/7>> blood cultures: Pending   Procedures: None   Consults: None  Brief Hospital Course: PAF with RVR Brief episode of A-fib on initial presentation to the ED-likely provoked by COVID-19 infection Currently maintaining sinus rhythm Continue Tikosyn/metoprolol INR slightly supratherapeutic-discussed with pharmacy-resume Coumadin starting tomorrow. Follow-up with cardiology/A-fib clinic as scheduled, patient instructed to follow-up with her Coumadin clinic on Monday for INR check.   COVID-19 infection Acute bronchitis Not  hypoxic No parenchymal involvement on CT chest No role for antimicrobial therapy Continue as needed bronchodilators-for some intermittent wheezing/coughing spells Resume prednisone as previous-she still has 4 days of supply at home Continue antitussives.   Chronic HFpEF Euvolemic Torsemide   HTN Stable Losartan/beta-blocker/torsemide   HLD Statin   DM-2 (A1c 6.5 on 8/8) CBG stable with SSI Resume oral hypoglycemics on discharge.   4.8 cm ascending aortic aneurysm Radiology recommending semiannual CTA or MRA-defer to PCP   Benign right adrenal adenoma Seen incidentally on CT chest-no further follow-up imaging recommended by radiology.   Debility/deconditioning Uses a walker at baseline Lives alone PT/OT eval   Morbid Obesity: Estimated body mass index is 48.54 kg/m as calculated from the following:   Height as of this encounter: 5\' 3"  (1.6 m).   Weight as of this encounter: 124.3 kg.    Obesity: Estimated body mass index is 48.54 kg/m as calculated from the following:   Height as of this encounter: 5\' 3"  (1.6 m).   Weight as of this encounter: 124.3 kg.    Discharge Diagnoses:  Principal Problem:   COVID-19 Active Problems:   Atrial fibrillation with rapid ventricular response (HCC)   COVID-19 virus infection   Chronic diastolic (congestive) heart failure (HCC)   Type 2 diabetes mellitus without complication, without long-term current use of insulin (HCC)   Essential hypertension   Mixed diabetic hyperlipidemia associated with type 2 diabetes mellitus (HCC)   GERD without esophagitis   Gout   PAF (paroxysmal atrial fibrillation) (HCC)   Paroxysmal A-fib (HCC)   Discharge Instructions:  Activity:  As tolerated  with Full fall precautions use walker/cane & assistance as needed   Discharge Instructions     Call MD for:  difficulty breathing, headache or visual disturbances   Complete by: As directed    Call MD for:  persistant dizziness or  light-headedness   Complete by: As directed    Call MD for:  redness, tenderness, or signs of infection (pain, swelling, redness, odor or green/yellow discharge around incision site)   Complete by: As directed    Diet - low sodium heart healthy   Complete by: As directed    Diet Carb Modified   Complete by: As directed    Discharge instructions   Complete by: As directed    Follow with Primary MD  Turmel, Caleb P, PA in 1-2 weeks  Please follow-up with your primary care practitioner and get your INR checked on Monday-05/26/2023.  Incidental finding-you have an ascending aortic aneurysm-please talk to your primary care practitioner-you require semiannual CTA or MRA of your chest-for close surveillance of this finding.  Please get a complete blood count and chemistry panel checked by your Primary MD at your next visit, and again as instructed by your Primary MD.  Get Medicines reviewed and adjusted: Please take all your medications with you for your next visit with your Primary MD  Laboratory/radiological data: Please request your Primary MD to go over all hospital tests and procedure/radiological results at the follow up, please ask your Primary MD to get all Hospital records sent to his/her office.  In some cases, they will be blood work, cultures and biopsy results pending at the time of your discharge. Please request that your primary care M.D. follows up on these results.  Also Note the following: If you experience worsening of your admission symptoms, develop shortness of breath, life threatening emergency, suicidal or homicidal thoughts you must seek medical attention immediately by calling 911 or calling your MD immediately  if symptoms less severe.  You must read complete instructions/literature along with all the possible adverse reactions/side effects for all the Medicines you take and that have been prescribed to you. Take any new Medicines after you have completely understood  and accpet all the possible adverse reactions/side effects.   Do not drive when taking Pain medications or sleeping medications (Benzodaizepines)  Do not take more than prescribed Pain, Sleep and Anxiety Medications. It is not advisable to combine anxiety,sleep and pain medications without talking with your primary care practitioner  Special Instructions: If you have smoked or chewed Tobacco  in the last 2 yrs please stop smoking, stop any regular Alcohol  and or any Recreational drug use.  Wear Seat belts while driving.  Please note: You were cared for by a hospitalist during your hospital stay. Once you are discharged, your primary care physician will handle any further medical issues. Please note that NO REFILLS for any discharge medications will be authorized once you are discharged, as it is imperative that you return to your primary care physician (or establish a relationship with a primary care physician if you do not have one) for your post hospital discharge needs so that they can reassess your need for medications and monitor your lab values.   Increase activity slowly   Complete by: As directed       Allergies as of 05/23/2023       Reactions   Bactrim [sulfamethoxazole-trimethoprim]    Drop in BP   Morphine And Codeine Other (See Comments)   Unresponsive-Very bad reaction.  Can take hydrocodone.    Oysters [shellfish Allergy] Shortness Of Breath, Itching, Swelling, Rash   Amlodipine Swelling, Other (See Comments)   Of legs   Percocet [oxycodone-acetaminophen] Other (See Comments)   hallucinations   Tdap [tetanus-diphth-acell Pertussis] Itching, Swelling, Rash   Tetanus Toxoids Itching, Swelling, Rash   Tramadol Swelling, Other (See Comments)   Of legs   Lactose Intolerance (gi) Other (See Comments)   Other Other (See Comments)   Tegretol [carbamazepine] Other (See Comments)   unknown   Tetanus-diphtheria Toxoids Td Other (See Comments)   Topiramate Other (See Comments)    Diovan [valsartan] Other (See Comments)   Cough   Diphtheria Toxoid Itching, Swelling, Rash   Macrodantin [nitrofurantoin Macrocrystal] Other (See Comments)   unspecified        Medication List     STOP taking these medications    doxycycline 100 MG tablet Commonly known as: VIBRA-TABS       TAKE these medications    acetaminophen 500 MG tablet Commonly known as: TYLENOL Take 500-1,000 mg by mouth every 6 (six) hours as needed (pain/sleep).   albuterol 108 (90 Base) MCG/ACT inhaler Commonly known as: VENTOLIN HFA Inhale 2 puffs into the lungs every 6 (six) hours as needed for wheezing or shortness of breath.   allopurinol 300 MG tablet Commonly known as: ZYLOPRIM Take 0.5 tablets (150 mg total) by mouth every evening.   benzonatate 100 MG capsule Commonly known as: Tessalon Perles Take 1 capsule (100 mg total) by mouth 3 (three) times daily as needed for cough.   chlorpheniramine-HYDROcodone 10-8 MG/5ML Commonly known as: TUSSIONEX Take 5 mLs by mouth every 12 (twelve) hours as needed for cough.   Dextromethorphan-guaiFENesin 10-100 MG/5ML liquid Take 10 mLs by mouth every 4 (four) hours as needed for cough   dofetilide 250 MCG capsule Commonly known as: TIKOSYN Take 1 capsule (250 mcg total) by mouth 2 (two) times daily.   HYDROcodone-acetaminophen 5-325 MG tablet Commonly known as: NORCO/VICODIN Take 0.5 tablets by mouth daily as needed (pain.).   Lagevrio 200 MG Caps capsule Generic drug: molnupiravir EUA Take 4 capsules by mouth 2 (two) times daily.   losartan 25 MG tablet Commonly known as: COZAAR Take 1 tablet (25 mg total) by mouth daily.   magnesium oxide 400 MG tablet Commonly known as: MAG-OX Take 1 tablet (400 mg total) by mouth at bedtime. What changed: how much to take   metFORMIN 500 MG tablet Commonly known as: GLUCOPHAGE Take 1 tablet (500 mg total) by mouth 2 (two) times daily with a meal.   metoprolol succinate 25 MG 24 hr  tablet Commonly known as: TOPROL-XL Take 0.5 tablets (12.5 mg total) by mouth at bedtime.   potassium chloride SA 20 MEQ tablet Commonly known as: KLOR-CON M Take 1 tablet (20 mEq total) by mouth in the morning.   predniSONE 20 MG tablet Commonly known as: DELTASONE Take 40 mg by mouth daily.   PreserVision AREDS 2 Caps Take 2 capsules by mouth daily with lunch.   rosuvastatin 5 MG tablet Commonly known as: CRESTOR Take 5 mg by mouth in the morning.   torsemide 20 MG tablet Commonly known as: DEMADEX Take 1 tablet (20 mg total) by mouth 2 (two) times daily.   vitamin B-12 500 MCG tablet Commonly known as: CYANOCOBALAMIN Take 500 mcg by mouth in the morning.   Vitamin D3 25 MCG (1000 UT) Caps Take 2,000 Units by mouth in the morning.   warfarin 5 MG tablet  Commonly known as: COUMADIN Take 0.5-1 tablets (2.5-5 mg total) by mouth See admin instructions. Take 0.5 tablet (2.5 mg) by mouth at supper on Mondays, Wednesdays & Fridays. Take 1 tablet (5 mg) by mouth at supper on Tuesdays, Thursdays, Saturdays & Sundays. Start taking on: May 24, 2023        Follow-up Information     Turmel, [place], [place]. Schedule an appointment as soon as possible for a visit in 1 week(s).   Specialty: Physician Assistant Contact information: [phone] [address] [place] [address] [phone]                Allergies  Allergen Reactions   Bactrim [Sulfamethoxazole-Trimethoprim]     Drop in BP   Morphine And Codeine Other (See Comments)    Unresponsive-Very bad reaction.  Can take hydrocodone.    Oysters [Shellfish Allergy] Shortness Of Breath, Itching, Swelling and Rash   Amlodipine Swelling and Other (See Comments)    Of legs   Percocet [Oxycodone-Acetaminophen] Other (See Comments)    hallucinations   Tdap [Tetanus-Diphth-Acell Pertussis] Itching, Swelling and Rash   Tetanus Toxoids Itching, Swelling and Rash   Tramadol Swelling and Other (See Comments)     Of legs   Lactose Intolerance (Gi) Other (See Comments)   Other Other (See Comments)   Tegretol [Carbamazepine] Other (See Comments)    unknown   Tetanus-Diphtheria Toxoids Td Other (See Comments)   Topiramate Other (See Comments)   Diovan [Valsartan] Other (See Comments)    Cough   Diphtheria Toxoid Itching, Swelling and Rash   Macrodantin [Nitrofurantoin Macrocrystal] Other (See Comments)    unspecified     Other Procedures/Studies: CT Chest Wo Contrast  Result Date: 05/22/2023 CLINICAL DATA:  Pneumonia, dyspnea EXAM: CT CHEST WITHOUT CONTRAST TECHNIQUE: Multidetector CT imaging of the chest was performed following the standard protocol without IV contrast. RADIATION DOSE REDUCTION: This exam was performed according to the departmental dose-optimization program which includes automated exposure control, adjustment of the mA and/or kV according to patient size and/or use of iterative reconstruction technique. COMPARISON:  09/20/2022 FINDINGS: Cardiovascular: Moderate multi-vessel coronary artery calcification. Global cardiac size within normal limits. No pericardial effusion. Central pulmonary arteries are enlarged in keeping with changes of pulmonary arterial hypertension. Main pulmonary artery measures 3.8 cm in diameter. There is fusiform dilation of the ascending thoracic aorta measuring 4.8 cm in diameter, stable since prior examination. Descending thoracic aorta is of normal caliber. Mediastinum/Nodes: No enlarged mediastinal or axillary lymph nodes. Thyroid gland, trachea, and esophagus demonstrate no significant findings. Lungs/Pleura: There is diffuse bronchial wall thickening present in keeping with airway inflammation. Mild parenchymal scarring within the lingula. Minimal bibasilar subpleural reticulation is present in keeping with mild fibrotic change. No confluent pulmonary infiltrate. No pneumothorax or pleural effusion. Upper Abdomen: Benign right adrenal adenoma is partially  visualized, better seen on prior examination of 09/20/2022. No follow-up imaging recommended for this lesion. No acute abnormality. Musculoskeletal: Multiple healed bilateral rib fractures are identified. No acute bone abnormality. No lytic or blastic bone lesion. IMPRESSION: 1. Diffuse bronchial wall thickening in keeping with airway inflammation. 2. Moderate multi-vessel coronary artery calcification. 3. Stable fusiform dilation of the ascending thoracic aorta measuring 4.8 cm in diameter. Ascending thoracic aortic aneurysm. Recommend semi-annual imaging followup by CTA or MRA and referral to cardiothoracic surgery if not already obtained. This recommendation follows 2010 ACCF/AHA/AATS/ACR/ASA/SCA/SCAI/SIR/STS/SVM Guidelines for the Diagnosis and Management of Patients With Thoracic Aortic Disease. Circulation. 2010; 121: A355-D322. Aortic aneurysm NOS (  ICD10-I71.9) 4. Pulmonary arterial hypertension. 5. Benign right adrenal adenoma. No follow-up imaging recommended. Electronically Signed   By: Helyn Numbers M.D.   On: 05/22/2023 01:42   DG Chest Port 1 View  Result Date: 05/21/2023 CLINICAL DATA:  Dyspnea EXAM: PORTABLE CHEST 1 VIEW COMPARISON:  07/22/2022 FINDINGS: Lungs are clear. No pneumothorax or pleural effusion. Cardiac size is mildly enlarged. Pulmonary vascularity is normal. No acute bone abnormality. IMPRESSION: 1. Mild cardiomegaly. Electronically Signed   By: Helyn Numbers M.D.   On: 05/21/2023 23:40     TODAY-DAY OF DISCHARGE:  Subjective:   Natasha Glass today has no headache,no chest abdominal pain,no new weakness tingling or numbness, feels much better wants to go home today.   Objective:   Blood pressure 134/72, pulse (!) 57, temperature 98.3 F (36.8 C), temperature source Oral, resp. rate 15, height 5\' 3"  (1.6 m), weight 124.3 kg, SpO2 99%.  Intake/Output Summary (Last 24 hours) at 05/23/2023 1034 Last data filed at 05/23/2023 0600 Gross per 24 hour  Intake 840 ml  Output 3850  ml  Net -3010 ml   Filed Weights   05/21/23 2253 05/22/23 0401  Weight: 123.4 kg 124.3 kg    Exam: Awake Alert, Oriented *3, No new F.N deficits, Normal affect [place].AT,PERRAL Supple Neck,No JVD, No cervical lymphadenopathy appriciated.  Symmetrical Chest wall movement, Good air movement bilaterally, CTAB RRR,No Gallops,Rubs or new Murmurs, No Parasternal Heave +ve B.Sounds, Abd Soft, Non tender, No organomegaly appriciated, No rebound -guarding or rigidity. No Cyanosis, Clubbing or edema, No new Rash or bruise   PERTINENT RADIOLOGIC STUDIES: CT Chest Wo Contrast  Result Date: 05/22/2023 CLINICAL DATA:  Pneumonia, dyspnea EXAM: CT CHEST WITHOUT CONTRAST TECHNIQUE: Multidetector CT imaging of the chest was performed following the standard protocol without IV contrast. RADIATION DOSE REDUCTION: This exam was performed according to the departmental dose-optimization program which includes automated exposure control, adjustment of the mA and/or kV according to patient size and/or use of iterative reconstruction technique. COMPARISON:  09/20/2022 FINDINGS: Cardiovascular: Moderate multi-vessel coronary artery calcification. Global cardiac size within normal limits. No pericardial effusion. Central pulmonary arteries are enlarged in keeping with changes of pulmonary arterial hypertension. Main pulmonary artery measures 3.8 cm in diameter. There is fusiform dilation of the ascending thoracic aorta measuring 4.8 cm in diameter, stable since prior examination. Descending thoracic aorta is of normal caliber. Mediastinum/Nodes: No enlarged mediastinal or axillary lymph nodes. Thyroid gland, trachea, and esophagus demonstrate no significant findings. Lungs/Pleura: There is diffuse bronchial wall thickening present in keeping with airway inflammation. Mild parenchymal scarring within the lingula. Minimal bibasilar subpleural reticulation is present in keeping with mild fibrotic change. No confluent pulmonary  infiltrate. No pneumothorax or pleural effusion. Upper Abdomen: Benign right adrenal adenoma is partially visualized, better seen on prior examination of 09/20/2022. No follow-up imaging recommended for this lesion. No acute abnormality. Musculoskeletal: Multiple healed bilateral rib fractures are identified. No acute bone abnormality. No lytic or blastic bone lesion. IMPRESSION: 1. Diffuse bronchial wall thickening in keeping with airway inflammation. 2. Moderate multi-vessel coronary artery calcification. 3. Stable fusiform dilation of the ascending thoracic aorta measuring 4.8 cm in diameter. Ascending thoracic aortic aneurysm. Recommend semi-annual imaging followup by CTA or MRA and referral to cardiothoracic surgery if not already obtained. This recommendation follows 2010 ACCF/AHA/AATS/ACR/ASA/SCA/SCAI/SIR/STS/SVM Guidelines for the Diagnosis and Management of Patients With Thoracic Aortic Disease. Circulation. 2010; 121: W413-K440. Aortic aneurysm NOS (ICD10-I71.9) 4. Pulmonary arterial hypertension. 5. Benign right adrenal adenoma. No follow-up imaging recommended. Electronically Signed  By: Helyn Numbers M.D.   On: 05/22/2023 01:42   DG Chest Port 1 View  Result Date: 05/21/2023 CLINICAL DATA:  Dyspnea EXAM: PORTABLE CHEST 1 VIEW COMPARISON:  07/22/2022 FINDINGS: Lungs are clear. No pneumothorax or pleural effusion. Cardiac size is mildly enlarged. Pulmonary vascularity is normal. No acute bone abnormality. IMPRESSION: 1. Mild cardiomegaly. Electronically Signed   By: Helyn Numbers M.D.   On: 05/21/2023 23:40     PERTINENT LAB RESULTS: CBC: Recent Labs    05/21/23 2346 05/23/23 0204  WBC 4.4 5.9  HGB 11.6* 11.1*  HCT 36.8 36.8  PLT 144* 148*   CMET CMP     Component Value Date/Time   NA 140 05/23/2023 0204   NA 140 08/06/2022 1023   K 3.5 05/23/2023 0204   CL 103 05/23/2023 0204   CO2 28 05/23/2023 0204   GLUCOSE 107 (H) 05/23/2023 0204   BUN 27 (H) 05/23/2023 0204   BUN 27  08/06/2022 1023   CREATININE 0.68 05/23/2023 0204   CREATININE 0.53 (L) 06/05/2015 0953   CALCIUM 8.9 05/23/2023 0204   PROT 7.3 05/21/2023 2346   ALBUMIN 4.0 05/21/2023 2346   AST 17 05/21/2023 2346   ALT 8 05/21/2023 2346   ALKPHOS 49 05/21/2023 2346   BILITOT 0.3 05/21/2023 2346   EGFR 72 08/06/2022 1023   GFRNONAA >60 05/23/2023 0204    GFR Estimated Creatinine Clearance: 70.7 mL/min (by C-G formula based on SCr of 0.68 mg/dL). No results for input(s): "LIPASE", "AMYLASE" in the last 72 hours. No results for input(s): "CKTOTAL", "CKMB", "CKMBINDEX", "TROPONINI" in the last 72 hours. Invalid input(s): "POCBNP" Recent Labs    05/21/23 2346  DDIMER <0.27   Recent Labs    05/22/23 0831  HGBA1C 6.5*   No results for input(s): "CHOL", "HDL", "LDLCALC", "TRIG", "CHOLHDL", "LDLDIRECT" in the last 72 hours. No results for input(s): "TSH", "T4TOTAL", "T3FREE", "THYROIDAB" in the last 72 hours.  Invalid input(s): "FREET3" No results for input(s): "VITAMINB12", "FOLATE", "FERRITIN", "TIBC", "IRON", "RETICCTPCT" in the last 72 hours. Coags: Recent Labs    05/22/23 0831 05/23/23 0925  INR 3.6* 3.6*   Microbiology: Recent Results (from the past 240 hour(s))  Blood Culture (routine x 2)     Status: None (Preliminary result)   Collection Time: 05/21/23 11:46 PM   Specimen: BLOOD  Result Value Ref Range Status   Specimen Description   Final    BLOOD RIGHT ANTECUBITAL Performed at [facility], [address] [place], [place] [address]    Special Requests   Final    BOTTLES DRAWN AEROBIC AND ANAEROBIC Blood Culture adequate volume Performed at [facility], [address], [place], [place] [address]    Culture   Final    NO GROWTH < 24 HOURS Performed at [facility], [address] [place], [place] [address]    Report Status PENDING  Incomplete  Blood Culture (routine x 2)     Status: None (Preliminary result)   Collection  Time: 05/21/23 11:46 PM   Specimen: BLOOD  Result Value Ref Range Status   Specimen Description   Final    BLOOD RIGHT ANTECUBITAL Performed at [facility], [address] [place], [place] [address]    Special Requests   Final    BOTTLES DRAWN AEROBIC AND ANAEROBIC Blood Culture results may not be optimal due to an excessive volume of blood received in culture bottles Performed at [facility], [address] [place],  [place] [address]    Culture   Final    NO GROWTH < 24 HOURS Performed at [facility], [address], [place], [place] [address]    Report Status PENDING  Incomplete  SARS Coronavirus 2 by RT PCR (hospital order, performed in [facility] hospital lab) *cepheid single result test* Anterior Nasal Swab     Status: Abnormal   Collection Time: 05/21/23 11:46 PM   Specimen: Anterior Nasal Swab  Result Value Ref Range Status   SARS Coronavirus 2 by RT PCR POSITIVE (A) NEGATIVE Final    Comment: (NOTE) SARS-CoV-2 target nucleic acids are DETECTED  SARS-CoV-2 RNA is generally detectable in upper respiratory specimens  during the acute phase of infection.  Positive results are indicative  of the presence of the identified virus, but do not rule out bacterial infection or co-infection with other pathogens not detected by the test.  Clinical correlation with patient history and  other diagnostic information is necessary to determine patient infection status.  The expected result is negative.  Fact Sheet for Patients:   RoadLapTop.co.za   Fact Sheet for Healthcare Providers:   http://kim-miller.com/    This test is not yet approved or cleared by the [place] FDA and  has been authorized for detection and/or diagnosis of SARS-CoV-2 by FDA under an Emergency Use Authorization (EUA).  This EUA will remain in effect (meaning this test can be used) for the duration of  the  COVID-19 declaration under Section 564(b)(1)  of the Act, 21 U.S.C. section 360-bbb-3(b)(1), unless the authorization is terminated or revoked sooner.   Performed at [facility], [address] [place], [place] [address]     FURTHER DISCHARGE INSTRUCTIONS:  Get Medicines reviewed and adjusted: Please take all your medications with you for your next visit with your Primary MD  Laboratory/radiological data: Please request your Primary MD to go over all hospital tests and procedure/radiological results at the follow up, please ask your Primary MD to get all Hospital records sent to his/her office.  In some cases, they will be blood work, cultures and biopsy results pending at the time of your discharge. Please request that your primary care M.D. goes through all the records of your hospital data and follows up on these results.  Also Note the following: If you experience worsening of your admission symptoms, develop shortness of breath, life threatening emergency, suicidal or homicidal thoughts you must seek medical attention immediately by calling 911 or calling your MD immediately  if symptoms less severe.  You must read complete instructions/literature along with all the possible adverse reactions/side effects for all the Medicines you take and that have been prescribed to you. Take any new Medicines after you have completely understood and accpet all the possible adverse reactions/side effects.   Do not drive when taking Pain medications or sleeping medications (Benzodaizepines)  Do not take more than prescribed Pain, Sleep and Anxiety Medications. It is not advisable to combine anxiety,sleep and pain medications without talking with your primary care practitioner  Special Instructions: If you have smoked or chewed Tobacco  in the last 2 yrs please stop smoking, stop any regular Alcohol  and or any Recreational drug use.  Wear Seat belts while driving.  Please  note: You were cared for by a hospitalist during your hospital stay. Once you are discharged, your primary care physician will handle any further medical issues. Please note that NO REFILLS for any discharge medications will be authorized once you  are discharged, as it is imperative that you return to your primary care physician (or establish a relationship with a primary care physician if you do not have one) for your post hospital discharge needs so that they can reassess your need for medications and monitor your lab values.  Total Time spent coordinating discharge including counseling, education and face to face time equals greater than 30 minutes.  SignedJeoffrey Massed 05/23/2023 10:34 AM

## 2023-05-23 NOTE — TOC Transition Note (Signed)
Transition of Care [facility]) - CM/SW Discharge Note   Patient Details  Name: Natasha Glass MRN: [id number] Date of Birth: 06/23/1941  Transition of [facility]) CM/SW Contact:  Gordy Clement, RN Phone Number: 05/23/2023, 11:50 AM   Clinical Narrative:     Patient to DC to home today  Home Health will be provided by [facility]. Family to transport No DME recommended     Final next level of care: Home w Home Health Services Barriers to Discharge: No Barriers Identified   Patient Goals and CMS Choice      Discharge Placement                         Discharge Plan and Services Additional resources added to the After Visit Summary for                    DME Agency:  [facility]       HH Arranged: PT, OT [facility] Agency: [facility] Date [facility] Agency Contacted: 05/23/23 Time HH Agency Contacted: 1150 Representative spoke with at [facility] Agency: Lorenza Chick  Social Determinants of Health (SDOH) Interventions SDOH Screenings   Tobacco Use: Low Risk  (05/21/2023)     Readmission Risk Interventions    08/30/2021   12:37 PM  Readmission Risk Prevention Plan  Medication Screening Complete  Transportation Screening Complete

## 2023-05-23 NOTE — Progress Notes (Signed)
ANTICOAGULATION CONSULT NOTE - Follow -Up Consult  Pharmacy Consult for Coumadin Indication: atrial fibrillation  Allergies  Allergen Reactions   Bactrim [Sulfamethoxazole-Trimethoprim]     Drop in BP   Morphine And Codeine Other (See Comments)    Unresponsive-Very bad reaction.  Can take hydrocodone.    Oysters [Shellfish Allergy] Shortness Of Breath, Itching, Swelling and Rash   Amlodipine Swelling and Other (See Comments)    Of legs   Percocet [Oxycodone-Acetaminophen] Other (See Comments)    hallucinations   Tdap [Tetanus-Diphth-Acell Pertussis] Itching, Swelling and Rash   Tetanus Toxoids Itching, Swelling and Rash   Tramadol Swelling and Other (See Comments)    Of legs   Lactose Intolerance (Gi) Other (See Comments)   Other Other (See Comments)   Tegretol [Carbamazepine] Other (See Comments)    unknown   Tetanus-Diphtheria Toxoids Td Other (See Comments)   Topiramate Other (See Comments)   Diovan [Valsartan] Other (See Comments)    Cough   Diphtheria Toxoid Itching, Swelling and Rash   Macrodantin [Nitrofurantoin Macrocrystal] Other (See Comments)    unspecified    Patient Measurements: Height: 5\' 3"  (160 cm) Weight: 124.3 kg (274 lb 0.5 oz) IBW/kg (Calculated) : 52.4  Vital Signs: Temp: 98.3 F (36.8 C) (08/09 0400) Temp Source: Oral (08/09 0400) BP: 134/72 (08/09 0400) Pulse Rate: 57 (08/09 0400)  Labs: Recent Labs    05/21/23 2346 05/22/23 0831 05/23/23 0204 05/23/23 0925  HGB 11.6*  --  11.1*  --   HCT 36.8  --  36.8  --   PLT 144*  --  148*  --   LABPROT 32.9* 36.4*  --  36.1*  INR 3.2* 3.6*  --  3.6*  CREATININE 0.72  --  0.68  --   TROPONINIHS 8  --   --   --     Estimated Creatinine Clearance: 70.7 mL/min (by C-G formula based on SCr of 0.68 mg/dL).   Medical History: Past Medical History:  Diagnosis Date   Anemia    Arthritis    CHF (congestive heart failure) (HCC)    DVT (deep venous thrombosis) (HCC) 01/2009   Dysrhythmia     GERD (gastroesophageal reflux disease)    Gout    History of blood transfusion    1990's   History of colonic polyps 09/2010   History of pneumonia    Hyperlipidemia    Hypertension    Lactose intolerance    Macular degeneration, dry    bilateral   Migraine    Obesity    Osteopenia    PAF (paroxysmal atrial fibrillation) (HCC)    Peripheral neuropathy    Pulmonary embolism (HCC) 01/2009   Sleep apnea    CPAP pressure 4.0-16.0   Thoracic ascending aortic aneurysm (HCC)    Type II diabetes mellitus (HCC)    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   Past Week   allopurinol (ZYLOPRIM) 300 MG tablet Take 0.5 tablets (150 mg total) by mouth every evening. 15 tablet 0 05/21/2023   Cholecalciferol (VITAMIN D3) 25 MCG (1000 UT) CAPS Take 2,000 Units by mouth in the morning.   05/21/2023   dofetilide (TIKOSYN) 250 MCG capsule Take 1 capsule (250 mcg total) by mouth 2 (two) times daily. 180 capsule 2 05/21/2023 at  2200   doxycycline (VIBRA-TABS) 100 MG tablet Take 100 mg by mouth 2 (two) times daily.   05/21/2023   HYDROcodone-acetaminophen (NORCO/VICODIN) 5-325 MG tablet Take 0.5 tablets by mouth daily as needed (pain.).   Past Week   LAGEVRIO 200 MG CAPS capsule Take 4 capsules by mouth 2 (two) times daily.   05/21/2023   losartan (COZAAR) 25 MG tablet Take 1 tablet (25 mg total) by mouth daily. 30 tablet 0 05/21/2023   magnesium oxide (MAG-OX) 400 MG tablet Take 1 tablet (400 mg total) by mouth at bedtime. (Patient taking differently: Take 250 mg by mouth at bedtime.) 30 tablet 0 05/21/2023   metFORMIN (GLUCOPHAGE) 500 MG tablet Take 1 tablet (500 mg total) by mouth 2 (two) times daily with a meal. 60 tablet 0 05/21/2023   metoprolol succinate (TOPROL-XL) 25 MG 24 hr tablet  Take 0.5 tablets (12.5 mg total) by mouth at bedtime. 45 tablet 1 05/21/2023   Multiple Vitamins-Minerals (PRESERVISION AREDS 2) CAPS Take 2 capsules by mouth daily with lunch.   05/21/2023   potassium chloride SA (KLOR-CON M) 20 MEQ tablet Take 1 tablet (20 mEq total) by mouth in the morning.   05/21/2023   predniSONE (DELTASONE) 20 MG tablet Take 40 mg by mouth daily.   05/21/2023   rosuvastatin (CRESTOR) 5 MG tablet Take 5 mg by mouth in the morning.   05/21/2023   torsemide (DEMADEX) 20 MG tablet Take 1 tablet (20 mg total) by mouth 2 (two) times daily. 180 tablet 3 05/21/2023   vitamin B-12 (CYANOCOBALAMIN) 500 MCG tablet Take 500 mcg by mouth in the morning.   05/21/2023   [DISCONTINUED] warfarin (COUMADIN) 5 MG tablet Take 0.5-1 tablets (2.5-5 mg total) by mouth See admin instructions. Take 0.5 tablet (2.5 mg) by mouth at supper on Mondays, Wednesdays & Fridays. Take 1 tablet (5 mg) by mouth at supper on Tuesdays, Thursdays, Saturdays & Sundays.   05/21/2023 at 1700    Assessment: 82 y.o. female admitted with SOB due to COVID infection. She is on warfarin PTA for h/o Afib and VTE. Pharmacy consulted to continue warfarin while inpatient.   PTA dosing: Warfarin 2.5mg  M/W/F, 5mg  AOD. INR elevated at 3.2 and 3.6 on admission.   Today, INR remains elevated at 3.6 and team is planning for discharge today. With continued elevated INR, plan to hold warfarin today. INR was likely elevated on PTA regimen due to illness and reduced PO intake.   Goal of Therapy:  INR 2-3 Monitor platelets by anticoagulation protocol: Yes   Plan:  Hold warfarin today Re-initiate PTA warfarin regimen on discharge with close follow up in clinic Monitor daily INR, CBC and s/s of bleeding while inpatient    05/23/2023,10:20 AM

## 2023-05-23 NOTE — Evaluation (Signed)
Physical Therapy Evaluation Patient Details Name: Natasha Glass MRN: [id number] DOB: 12-01-1940 Today's Date: 05/23/2023  History of Present Illness  Pt is a 82 y.o. female admitted to [facility] on 8/7 after presenting to ED with complaints of shortness of breath. On 8/4, pt presented to [facility] ED with complaints of worsening shortness of breath, cough, congestion since Sunday, 8/4.  Pt was seen by her PCP on 8/5 and diagnosed with COVID-19 virus infection. PMH inlcudes paroxysmal AFib on warfarin, CHF, HTN, DMII, gout, DVT/PE, OSA, PAF, hx of B rotator cuff injuries with sx on Left.  Clinical Impression  Pt is presenting close to baseline. Pt reports 2 falls a year ago. She currently has a fear of falling and is unstable on her feet requiring moderate support from AD to maintain balance during functional activities. Pt is Mod I for all functional activities with balance deficits observed. Currently recommending skilled physical therapy services 3x/weekly on discharge from acute care hospital setting in order to decrease risk for falls, injury, immobility and re-hospitalization in order to improve quality of life. Pt HR/O2 sats remained WNL throughout session. Pt is cleared from a physical therapy stand point for home once medically stable with supervision for stairs and PRN assistance at home.          If plan is discharge home, recommend the following: Assistance with cooking/housework;Help with stairs or ramp for entrance     Equipment Recommendations None recommended by PT        Precautions / Restrictions Precautions Precautions: Fall;Other (comment) Precaution Comments: watch O2 sat Restrictions Weight Bearing Restrictions: No      Mobility  Bed Mobility Overal bed mobility: Modified Independent Bed Mobility: Rolling, Supine to Sit Rolling: Modified independent (Device/Increase time)   Supine to sit: Modified independent (Device/Increase time)     General bed mobility comments:  Pt sleeps in a recliner at baseline., Extra time with use of bed rail and HOB slightly elevated. Patient Response: Cooperative  Transfers Overall transfer level: Modified independent Equipment used: Rolling walker (2 wheels) Transfers: Sit to/from Stand Sit to Stand: Modified independent (Device/Increase time)           General transfer comment: Moderate support on RW with bil UE for gait. O2 sats did well on RA.    Ambulation/Gait Ambulation/Gait assistance: Modified independent (Device/Increase time) Gait Distance (Feet): 40 Feet Assistive device: Rolling walker (2 wheels) Gait Pattern/deviations: Step-through pattern, Decreased stride length, Decreased step length - left, Decreased dorsiflexion - left, Decreased step length - right, Decreased dorsiflexion - right Gait velocity: Decreased cadence. Gait velocity interpretation: <1.8 ft/sec, indicate of risk for recurrent falls   General Gait Details: Short step length with slow progress bil. Pt needs AD for balance with dynamic functional activity  Stairs Stairs:  (Discussed stairs with pt. She has one step to get into her home and her DIL will be with her. pt states she has a bar to grab. With ability to sit to stand and ambulate pt demonstrates ability to navigate stairs per home set up with supervision and rail)           Tilt Bed Tilt Bed Patient Response: Cooperative      Balance Overall balance assessment: Mild deficits observed, not formally tested Sitting-balance support: Single extremity supported, No upper extremity supported, Feet supported Sitting balance-Leahy Scale: Good     Standing balance support: Bilateral upper extremity supported, During functional activity, Reliant on assistive device for balance Standing balance-Leahy Scale: Fair  Pertinent Vitals/Pain Pain Assessment Pain Assessment: 0-10 Pain Score: 3  Pain Location: headache Pain Descriptors / Indicators: Aching Pain  Intervention(s): Monitored during session, Patient requesting pain meds-RN notified    Home Living Family/patient expects to be discharged to:: Private residence Living Arrangements: Alone Available Help at Discharge: Friend(s);Family;Available PRN/intermittently;Neighbor (pt has church members who are able to help who bring meal trains and neighbors who can help. Pt daughter or DIL can stay the night if needed.) Type of Home: House Home Access: Stairs to enter;Level entry Entrance Stairs-Rails: Left (at deck which she uses the most) Entrance Stairs-Number of Steps: 2 stairs at front door, 1 step at deck, and level entry from garage   Home Layout: One level Home Equipment: Shower seat - built in;Grab bars - tub/shower;Grab bars - toilet;Rollator (4 wheels);Rolling Walker (2 wheels);BSC/3in1;Lift chair;Hospital bed;Wheelchair - manual;Hand held shower head;Adaptive equipment Additional Comments: usually sleeps in lift chair    Prior Function Prior Level of Function : Independent/Modified Independent             Mobility Comments: Uses a Rollator ADLs Comments: Independent to Mod I with ADLs and IADLs; Daughter often assists with meal prep.     Extremity/Trunk Assessment   Upper Extremity Assessment Upper Extremity Assessment: Defer to OT evaluation    Lower Extremity Assessment Lower Extremity Assessment: RLE deficits/detail;LLE deficits/detail RLE Deficits / Details: bil LE edema LLE Deficits / Details: Edematous B LE    Cervical / Trunk Assessment Cervical / Trunk Assessment: Normal  Communication   Communication Communication: No apparent difficulties  Cognition Arousal: Alert Behavior During Therapy: WFL for tasks assessed/performed Overall Cognitive Status: Within Functional Limits for tasks assessed       General Comments General comments (skin integrity, edema, etc.): Pt O2 sats and HR remained WNL throughout session. pt has support from neighbors and church as  well as DIL's and daughter.        Assessment/Plan    PT Assessment Patient needs continued PT services  PT Problem List Decreased strength;Decreased activity tolerance;Decreased mobility;Decreased balance       PT Treatment Interventions DME instruction;Functional mobility training;Balance training;Gait training;Therapeutic activities;Stair training;Therapeutic exercise;Neuromuscular re-education;Patient/family education    PT Goals (Current goals can be found in the Care Plan section)  Acute Rehab PT Goals Patient Stated Goal: To return home and improve mobility PT Goal Formulation: With patient Time For Goal Achievement: 06/06/23 Potential to Achieve Goals: Good    Frequency Min 1X/week        AM-PAC PT "6 Clicks" Mobility  Outcome Measure Help needed turning from your back to your side while in a flat bed without using bedrails?: None Help needed moving from lying on your back to sitting on the side of a flat bed without using bedrails?: None Help needed moving to and from a bed to a chair (including a wheelchair)?: None Help needed standing up from a chair using your arms (e.g., wheelchair or bedside chair)?: None Help needed to walk in hospital room?: None Help needed climbing 3-5 steps with a railing? : A Little 6 Click Score: 23    End of Session Equipment Utilized During Treatment: Gait belt Activity Tolerance: Patient tolerated treatment well Patient left: in chair;with call bell/phone within reach Nurse Communication: Mobility status PT Visit Diagnosis: Other abnormalities of gait and mobility (R26.89)    Time: 6295-2841 PT Time Calculation (min) (ACUTE ONLY): 30 min   Charges:   PT Evaluation $PT Eval Low Complexity: 1 Low PT Treatments $Therapeutic Activity:  8-22 mins PT General Charges $$ ACUTE PT VISIT: 1 Visit         Harrel Carina, DPT, CLT  Acute Rehabilitation Services Office: [phone] (Secure chat preferred)   Claudia Desanctis 05/23/2023, 10:30 AM

## 2023-05-24 ENCOUNTER — Other Ambulatory Visit (HOSPITAL_COMMUNITY): Payer: Self-pay

## 2023-05-30 ENCOUNTER — Ambulatory Visit: Payer: Medicare Other | Admitting: Cardiology

## 2023-05-30 DIAGNOSIS — R350 Frequency of micturition: Secondary | ICD-10-CM | POA: Diagnosis not present

## 2023-06-02 ENCOUNTER — Encounter (INDEPENDENT_AMBULATORY_CARE_PROVIDER_SITE_OTHER): Payer: Medicare Other | Admitting: Ophthalmology

## 2023-06-02 DIAGNOSIS — H35372 Puckering of macula, left eye: Secondary | ICD-10-CM | POA: Diagnosis not present

## 2023-06-02 DIAGNOSIS — K5190 Ulcerative colitis, unspecified, without complications: Secondary | ICD-10-CM | POA: Diagnosis not present

## 2023-06-02 DIAGNOSIS — D6869 Other thrombophilia: Secondary | ICD-10-CM | POA: Diagnosis not present

## 2023-06-02 DIAGNOSIS — Z9989 Dependence on other enabling machines and devices: Secondary | ICD-10-CM | POA: Diagnosis not present

## 2023-06-02 DIAGNOSIS — H353221 Exudative age-related macular degeneration, left eye, with active choroidal neovascularization: Secondary | ICD-10-CM

## 2023-06-02 DIAGNOSIS — Z5181 Encounter for therapeutic drug level monitoring: Secondary | ICD-10-CM | POA: Diagnosis not present

## 2023-06-02 DIAGNOSIS — H35033 Hypertensive retinopathy, bilateral: Secondary | ICD-10-CM | POA: Diagnosis not present

## 2023-06-02 DIAGNOSIS — H43813 Vitreous degeneration, bilateral: Secondary | ICD-10-CM | POA: Diagnosis not present

## 2023-06-02 DIAGNOSIS — H353112 Nonexudative age-related macular degeneration, right eye, intermediate dry stage: Secondary | ICD-10-CM

## 2023-06-02 DIAGNOSIS — I5032 Chronic diastolic (congestive) heart failure: Secondary | ICD-10-CM | POA: Diagnosis not present

## 2023-06-02 DIAGNOSIS — I10 Essential (primary) hypertension: Secondary | ICD-10-CM | POA: Diagnosis not present

## 2023-06-02 DIAGNOSIS — I712 Thoracic aortic aneurysm, without rupture, unspecified: Secondary | ICD-10-CM | POA: Diagnosis not present

## 2023-06-02 DIAGNOSIS — I480 Paroxysmal atrial fibrillation: Secondary | ICD-10-CM | POA: Diagnosis not present

## 2023-06-03 DIAGNOSIS — I5032 Chronic diastolic (congestive) heart failure: Secondary | ICD-10-CM | POA: Diagnosis not present

## 2023-06-03 DIAGNOSIS — M109 Gout, unspecified: Secondary | ICD-10-CM | POA: Diagnosis not present

## 2023-06-03 DIAGNOSIS — I110 Hypertensive heart disease with heart failure: Secondary | ICD-10-CM | POA: Diagnosis not present

## 2023-06-03 DIAGNOSIS — I480 Paroxysmal atrial fibrillation: Secondary | ICD-10-CM | POA: Diagnosis not present

## 2023-06-03 DIAGNOSIS — J209 Acute bronchitis, unspecified: Secondary | ICD-10-CM | POA: Diagnosis not present

## 2023-06-05 DIAGNOSIS — M109 Gout, unspecified: Secondary | ICD-10-CM | POA: Diagnosis not present

## 2023-06-05 DIAGNOSIS — I480 Paroxysmal atrial fibrillation: Secondary | ICD-10-CM | POA: Diagnosis not present

## 2023-06-05 DIAGNOSIS — I110 Hypertensive heart disease with heart failure: Secondary | ICD-10-CM | POA: Diagnosis not present

## 2023-06-05 DIAGNOSIS — I5032 Chronic diastolic (congestive) heart failure: Secondary | ICD-10-CM | POA: Diagnosis not present

## 2023-06-05 DIAGNOSIS — J209 Acute bronchitis, unspecified: Secondary | ICD-10-CM | POA: Diagnosis not present

## 2023-06-09 NOTE — Progress Notes (Unsigned)
HPI: Follow-up atrial fibrillation.  Previously followed by Dr. Johney Frame but transitioning to me.  Carotid Dopplers July 2015 showed no stenosis.  Transesophageal echocardiogram October 2023 showed normal LV function, moderate left atrial enlargement, mild mitral regurgitation, mild aortic insufficiency.  Chest CT August 2024 showed coronary calcification, stable fusiform dilatation of the ascending thoracic aorta at 4.8 cm.  Since last seen  Current Outpatient Medications  Medication Sig Dispense Refill   acetaminophen (TYLENOL) 500 MG tablet Take 500-1,000 mg by mouth every 6 (six) hours as needed (pain/sleep).     albuterol (VENTOLIN HFA) 108 (90 Base) MCG/ACT inhaler Inhale 2 puffs into the lungs every 6 (six) hours as needed for wheezing or shortness of breath. 6.7 g 0   allopurinol (ZYLOPRIM) 300 MG tablet Take 0.5 tablets (150 mg total) by mouth every evening. 15 tablet 0   benzonatate (TESSALON PERLES) 100 MG capsule Take 1 capsule (100 mg total) by mouth 3 (three) times daily as needed for cough. 30 capsule 0   chlorpheniramine-HYDROcodone (TUSSIONEX) 10-8 MG/5ML Take 5 mLs by mouth every 12 (twelve) hours as needed for cough. 70 mL 0   Cholecalciferol (VITAMIN D3) 25 MCG (1000 UT) CAPS Take 2,000 Units by mouth in the morning.     Dextromethorphan-guaiFENesin 10-100 MG/5ML liquid Take 10 mLs by mouth every 4 (four) hours as needed for cough 237 mL 0   dofetilide (TIKOSYN) 250 MCG capsule Take 1 capsule (250 mcg total) by mouth 2 (two) times daily. 180 capsule 2   HYDROcodone-acetaminophen (NORCO/VICODIN) 5-325 MG tablet Take 0.5 tablets by mouth daily as needed (pain.).     LAGEVRIO 200 MG CAPS capsule Take 4 capsules by mouth 2 (two) times daily.     losartan (COZAAR) 25 MG tablet Take 1 tablet (25 mg total) by mouth daily. 30 tablet 0   magnesium oxide (MAG-OX) 400 MG tablet Take 1 tablet (400 mg total) by mouth at bedtime. (Patient taking differently: Take 250 mg by mouth at  bedtime.) 30 tablet 0   metFORMIN (GLUCOPHAGE) 500 MG tablet Take 1 tablet (500 mg total) by mouth 2 (two) times daily with a meal. 60 tablet 0   metoprolol succinate (TOPROL-XL) 25 MG 24 hr tablet Take 0.5 tablets (12.5 mg total) by mouth at bedtime. 45 tablet 1   Multiple Vitamins-Minerals (PRESERVISION AREDS 2) CAPS Take 2 capsules by mouth daily with lunch.     potassium chloride SA (KLOR-CON M) 20 MEQ tablet Take 1 tablet (20 mEq total) by mouth in the morning.     predniSONE (DELTASONE) 20 MG tablet Take 40 mg by mouth daily.     rosuvastatin (CRESTOR) 5 MG tablet Take 5 mg by mouth in the morning.     torsemide (DEMADEX) 20 MG tablet Take 1 tablet (20 mg total) by mouth 2 (two) times daily. 180 tablet 3   vitamin B-12 (CYANOCOBALAMIN) 500 MCG tablet Take 500 mcg by mouth in the morning.     warfarin (COUMADIN) 5 MG tablet Take 0.5-1 tablets (2.5-5 mg total) by mouth See admin instructions. Take 0.5 tablet (2.5 mg) by mouth at supper on Mondays, Wednesdays & Fridays. Take 1 tablet (5 mg) by mouth at supper on Tuesdays, Thursdays, Saturdays & Sundays.     No current facility-administered medications for this visit.     Past Medical History:  Diagnosis Date   Anemia    Arthritis    CHF (congestive heart failure) (HCC)    DVT (deep venous thrombosis) (HCC)  01/2009   Dysrhythmia    GERD (gastroesophageal reflux disease)    Gout    History of blood transfusion    1990's   History of colonic polyps 09/2010   History of pneumonia    Hyperlipidemia    Hypertension    Lactose intolerance    Macular degeneration, dry    bilateral   Migraine    Obesity    Osteopenia    PAF (paroxysmal atrial fibrillation) (HCC)    Peripheral neuropathy    Pulmonary embolism (HCC) 01/2009   Sleep apnea    CPAP pressure 4.0-16.0   Thoracic ascending aortic aneurysm (HCC)    Type II diabetes mellitus (HCC)    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)    Urinary incontinence     Past  Surgical History:  Procedure Laterality Date   ABDOMINAL EXPLORATION SURGERY  1960   ABDOMINAL HYSTERECTOMY  1986   CARDIAC CATHETERIZATION     CARDIOVERSION N/A 05/07/2018   Procedure: CARDIOVERSION;  Surgeon: Wendall Stade, MD;  Location: [facility] ENDOSCOPY;  Service: Cardiovascular;  Laterality: N/A;   COLONOSCOPY W/ BIOPSIES AND POLYPECTOMY  2005; 2011; 2012   COLONOSCOPY WITH PROPOFOL N/A 09/10/2016   Procedure: COLONOSCOPY WITH PROPOFOL;  Surgeon: Charolett Bumpers, MD;  Location: [facility] ENDOSCOPY;  Service: Endoscopy;  Laterality: N/A;   JOINT REPLACEMENT     KNEE ARTHROSCOPY Left ~ 1999   SHOULDER OPEN ROTATOR CUFF REPAIR Left 2011   TEE WITHOUT CARDIOVERSION N/A 08/13/2022   Procedure: TRANSESOPHAGEAL ECHOCARDIOGRAM (TEE);  Surgeon: Quintella Reichert, MD;  Location: [facility] ENDOSCOPY;  Service: Cardiovascular;  Laterality: N/A;   TOTAL KNEE ARTHROPLASTY Bilateral 2005-2012   left-right    Social History   Socioeconomic History   Marital status: Widowed    Spouse name: Not on file   Number of children: Not on file   Years of education: Not on file   Highest education level: Not on file  Occupational History   Not on file  Tobacco Use   Smoking status: Never   Smokeless tobacco: Never  Vaping Use   Vaping status: Never Used  Substance and Sexual Activity   Alcohol use: No   Drug use: No   Sexual activity: Not Currently  Other Topics Concern   Not on file  Social History Narrative   Not on file   Social Determinants of Health   Financial Resource Strain: Not on file  Food Insecurity: Not on file  Transportation Needs: Not on file  Physical Activity: Not on file  Stress: Not on file  Social Connections: Not on file  Intimate Partner Violence: Not on file    Family History  Problem Relation Age of Onset   Colon cancer Mother    Lung cancer Mother    Heart disease Father     ROS: no fevers or chills, productive cough, hemoptysis, dysphasia, odynophagia, melena,  hematochezia, dysuria, hematuria, rash, seizure activity, orthopnea, PND, pedal edema, claudication. Remaining systems are negative.  Physical Exam: Well-developed well-nourished in no acute distress.  Skin is warm and dry.  HEENT is normal.  Neck is supple.  Chest is clear to auscultation with normal expansion.  Cardiovascular exam is regular rate and rhythm.  Abdominal exam nontender or distended. No masses palpated. Extremities show no edema. neuro grossly intact  ECG- personally reviewed  A/P  1 paroxysmal atrial fibrillation-continue Tikosyn, Coumadin and low-dose Toprol.  2 thoracic aortic aneurysm-patient will need follow-up CT February 2025.  She is followed by Dr. [name].  3 hypertension-blood pressure controlled.  Continue present medications.  4 chronic diastolic congestive heart failure-  5 morbid obesity-we discussed the importance of weight loss.  6 obstructive sleep apnea-continue CPAP.  [name], MD

## 2023-06-10 ENCOUNTER — Encounter: Payer: Self-pay | Admitting: Cardiology

## 2023-06-10 ENCOUNTER — Ambulatory Visit: Payer: Medicare Other | Attending: Cardiology | Admitting: Cardiology

## 2023-06-10 VITALS — BP 124/68 | HR 90 | Ht 63.0 in | Wt 275.0 lb

## 2023-06-10 DIAGNOSIS — I7121 Aneurysm of the ascending aorta, without rupture: Secondary | ICD-10-CM

## 2023-06-10 DIAGNOSIS — I5032 Chronic diastolic (congestive) heart failure: Secondary | ICD-10-CM

## 2023-06-10 DIAGNOSIS — G4733 Obstructive sleep apnea (adult) (pediatric): Secondary | ICD-10-CM | POA: Diagnosis not present

## 2023-06-10 DIAGNOSIS — I4819 Other persistent atrial fibrillation: Secondary | ICD-10-CM

## 2023-06-10 DIAGNOSIS — I480 Paroxysmal atrial fibrillation: Secondary | ICD-10-CM | POA: Diagnosis not present

## 2023-06-10 NOTE — Patient Instructions (Signed)
Medication Instructions:  NO CHANGES *If you need a refill on your cardiac medications before your next appointment, please call your pharmacy*   Follow-Up: At [facility], you and your health needs are our priority.  As part of our continuing mission to provide you with exceptional heart care, we have created designated Provider Care Teams.  These Care Teams include your primary Cardiologist (physician) and Advanced Practice Providers (APPs -  Physician Assistants and Nurse Practitioners) who all work together to provide you with the care you need, when you need it.  We recommend signing up for the patient portal called "MyChart".  Sign up information is provided on this After Visit Summary.  MyChart is used to connect with patients for Virtual Visits (Telemedicine).  Patients are able to view lab/test results, encounter notes, upcoming appointments, etc.  Non-urgent messages can be sent to your provider as well.   To learn more about what you can do with MyChart, go to [email or web address].    Your next appointment:   6 months with Dr. Jens Som ** call in October for next visit  Other Instructions  Eliquis - anticoagulant (blood thinner)

## 2023-06-11 ENCOUNTER — Ambulatory Visit: Payer: Medicare Other | Admitting: Nurse Practitioner

## 2023-06-11 DIAGNOSIS — M109 Gout, unspecified: Secondary | ICD-10-CM | POA: Diagnosis not present

## 2023-06-11 DIAGNOSIS — I110 Hypertensive heart disease with heart failure: Secondary | ICD-10-CM | POA: Diagnosis not present

## 2023-06-11 DIAGNOSIS — J209 Acute bronchitis, unspecified: Secondary | ICD-10-CM | POA: Diagnosis not present

## 2023-06-11 DIAGNOSIS — I480 Paroxysmal atrial fibrillation: Secondary | ICD-10-CM | POA: Diagnosis not present

## 2023-06-11 DIAGNOSIS — I5032 Chronic diastolic (congestive) heart failure: Secondary | ICD-10-CM | POA: Diagnosis not present

## 2023-06-13 DIAGNOSIS — M109 Gout, unspecified: Secondary | ICD-10-CM | POA: Diagnosis not present

## 2023-06-13 DIAGNOSIS — I110 Hypertensive heart disease with heart failure: Secondary | ICD-10-CM | POA: Diagnosis not present

## 2023-06-13 DIAGNOSIS — I5032 Chronic diastolic (congestive) heart failure: Secondary | ICD-10-CM | POA: Diagnosis not present

## 2023-06-13 DIAGNOSIS — I480 Paroxysmal atrial fibrillation: Secondary | ICD-10-CM | POA: Diagnosis not present

## 2023-06-13 DIAGNOSIS — J209 Acute bronchitis, unspecified: Secondary | ICD-10-CM | POA: Diagnosis not present

## 2023-06-17 DIAGNOSIS — M109 Gout, unspecified: Secondary | ICD-10-CM | POA: Diagnosis not present

## 2023-06-17 DIAGNOSIS — J209 Acute bronchitis, unspecified: Secondary | ICD-10-CM | POA: Diagnosis not present

## 2023-06-17 DIAGNOSIS — I5032 Chronic diastolic (congestive) heart failure: Secondary | ICD-10-CM | POA: Diagnosis not present

## 2023-06-17 DIAGNOSIS — I480 Paroxysmal atrial fibrillation: Secondary | ICD-10-CM | POA: Diagnosis not present

## 2023-06-17 DIAGNOSIS — I110 Hypertensive heart disease with heart failure: Secondary | ICD-10-CM | POA: Diagnosis not present

## 2023-06-18 DIAGNOSIS — I5032 Chronic diastolic (congestive) heart failure: Secondary | ICD-10-CM | POA: Diagnosis not present

## 2023-06-18 DIAGNOSIS — I480 Paroxysmal atrial fibrillation: Secondary | ICD-10-CM | POA: Diagnosis not present

## 2023-06-18 DIAGNOSIS — J209 Acute bronchitis, unspecified: Secondary | ICD-10-CM | POA: Diagnosis not present

## 2023-06-18 DIAGNOSIS — M109 Gout, unspecified: Secondary | ICD-10-CM | POA: Diagnosis not present

## 2023-06-18 DIAGNOSIS — I110 Hypertensive heart disease with heart failure: Secondary | ICD-10-CM | POA: Diagnosis not present

## 2023-06-20 DIAGNOSIS — I110 Hypertensive heart disease with heart failure: Secondary | ICD-10-CM | POA: Diagnosis not present

## 2023-06-20 DIAGNOSIS — M109 Gout, unspecified: Secondary | ICD-10-CM | POA: Diagnosis not present

## 2023-06-20 DIAGNOSIS — I480 Paroxysmal atrial fibrillation: Secondary | ICD-10-CM | POA: Diagnosis not present

## 2023-06-20 DIAGNOSIS — I5032 Chronic diastolic (congestive) heart failure: Secondary | ICD-10-CM | POA: Diagnosis not present

## 2023-06-20 DIAGNOSIS — J209 Acute bronchitis, unspecified: Secondary | ICD-10-CM | POA: Diagnosis not present

## 2023-06-22 DIAGNOSIS — S81801A Unspecified open wound, right lower leg, initial encounter: Secondary | ICD-10-CM | POA: Diagnosis not present

## 2023-06-23 DIAGNOSIS — J209 Acute bronchitis, unspecified: Secondary | ICD-10-CM | POA: Diagnosis not present

## 2023-06-23 DIAGNOSIS — M109 Gout, unspecified: Secondary | ICD-10-CM | POA: Diagnosis not present

## 2023-06-23 DIAGNOSIS — I480 Paroxysmal atrial fibrillation: Secondary | ICD-10-CM | POA: Diagnosis not present

## 2023-06-23 DIAGNOSIS — I110 Hypertensive heart disease with heart failure: Secondary | ICD-10-CM | POA: Diagnosis not present

## 2023-06-23 DIAGNOSIS — I5032 Chronic diastolic (congestive) heart failure: Secondary | ICD-10-CM | POA: Diagnosis not present

## 2023-06-24 DIAGNOSIS — I5032 Chronic diastolic (congestive) heart failure: Secondary | ICD-10-CM | POA: Diagnosis not present

## 2023-06-24 DIAGNOSIS — I480 Paroxysmal atrial fibrillation: Secondary | ICD-10-CM | POA: Diagnosis not present

## 2023-06-24 DIAGNOSIS — I110 Hypertensive heart disease with heart failure: Secondary | ICD-10-CM | POA: Diagnosis not present

## 2023-06-24 DIAGNOSIS — J209 Acute bronchitis, unspecified: Secondary | ICD-10-CM | POA: Diagnosis not present

## 2023-06-24 DIAGNOSIS — M109 Gout, unspecified: Secondary | ICD-10-CM | POA: Diagnosis not present

## 2023-06-27 DIAGNOSIS — J209 Acute bronchitis, unspecified: Secondary | ICD-10-CM | POA: Diagnosis not present

## 2023-06-27 DIAGNOSIS — I110 Hypertensive heart disease with heart failure: Secondary | ICD-10-CM | POA: Diagnosis not present

## 2023-06-27 DIAGNOSIS — I480 Paroxysmal atrial fibrillation: Secondary | ICD-10-CM | POA: Diagnosis not present

## 2023-06-27 DIAGNOSIS — M109 Gout, unspecified: Secondary | ICD-10-CM | POA: Diagnosis not present

## 2023-06-27 DIAGNOSIS — I5032 Chronic diastolic (congestive) heart failure: Secondary | ICD-10-CM | POA: Diagnosis not present

## 2023-06-30 ENCOUNTER — Encounter (INDEPENDENT_AMBULATORY_CARE_PROVIDER_SITE_OTHER): Payer: Medicare Other | Admitting: Ophthalmology

## 2023-06-30 DIAGNOSIS — H353112 Nonexudative age-related macular degeneration, right eye, intermediate dry stage: Secondary | ICD-10-CM | POA: Diagnosis not present

## 2023-06-30 DIAGNOSIS — H353221 Exudative age-related macular degeneration, left eye, with active choroidal neovascularization: Secondary | ICD-10-CM | POA: Diagnosis not present

## 2023-06-30 DIAGNOSIS — Z5181 Encounter for therapeutic drug level monitoring: Secondary | ICD-10-CM | POA: Diagnosis not present

## 2023-06-30 DIAGNOSIS — H35033 Hypertensive retinopathy, bilateral: Secondary | ICD-10-CM | POA: Diagnosis not present

## 2023-06-30 DIAGNOSIS — I10 Essential (primary) hypertension: Secondary | ICD-10-CM | POA: Diagnosis not present

## 2023-06-30 DIAGNOSIS — H43813 Vitreous degeneration, bilateral: Secondary | ICD-10-CM

## 2023-06-30 DIAGNOSIS — H35372 Puckering of macula, left eye: Secondary | ICD-10-CM

## 2023-07-01 DIAGNOSIS — I5032 Chronic diastolic (congestive) heart failure: Secondary | ICD-10-CM | POA: Diagnosis not present

## 2023-07-01 DIAGNOSIS — M109 Gout, unspecified: Secondary | ICD-10-CM | POA: Diagnosis not present

## 2023-07-01 DIAGNOSIS — I110 Hypertensive heart disease with heart failure: Secondary | ICD-10-CM | POA: Diagnosis not present

## 2023-07-01 DIAGNOSIS — J209 Acute bronchitis, unspecified: Secondary | ICD-10-CM | POA: Diagnosis not present

## 2023-07-01 DIAGNOSIS — I480 Paroxysmal atrial fibrillation: Secondary | ICD-10-CM | POA: Diagnosis not present

## 2023-07-04 DIAGNOSIS — I480 Paroxysmal atrial fibrillation: Secondary | ICD-10-CM | POA: Diagnosis not present

## 2023-07-04 DIAGNOSIS — I5032 Chronic diastolic (congestive) heart failure: Secondary | ICD-10-CM | POA: Diagnosis not present

## 2023-07-04 DIAGNOSIS — I110 Hypertensive heart disease with heart failure: Secondary | ICD-10-CM | POA: Diagnosis not present

## 2023-07-04 DIAGNOSIS — J209 Acute bronchitis, unspecified: Secondary | ICD-10-CM | POA: Diagnosis not present

## 2023-07-04 DIAGNOSIS — M109 Gout, unspecified: Secondary | ICD-10-CM | POA: Diagnosis not present

## 2023-07-08 DIAGNOSIS — J209 Acute bronchitis, unspecified: Secondary | ICD-10-CM | POA: Diagnosis not present

## 2023-07-08 DIAGNOSIS — M109 Gout, unspecified: Secondary | ICD-10-CM | POA: Diagnosis not present

## 2023-07-08 DIAGNOSIS — I5032 Chronic diastolic (congestive) heart failure: Secondary | ICD-10-CM | POA: Diagnosis not present

## 2023-07-08 DIAGNOSIS — I480 Paroxysmal atrial fibrillation: Secondary | ICD-10-CM | POA: Diagnosis not present

## 2023-07-08 DIAGNOSIS — I110 Hypertensive heart disease with heart failure: Secondary | ICD-10-CM | POA: Diagnosis not present

## 2023-07-11 DIAGNOSIS — S81801A Unspecified open wound, right lower leg, initial encounter: Secondary | ICD-10-CM | POA: Diagnosis not present

## 2023-07-15 DIAGNOSIS — I480 Paroxysmal atrial fibrillation: Secondary | ICD-10-CM | POA: Diagnosis not present

## 2023-07-15 DIAGNOSIS — I110 Hypertensive heart disease with heart failure: Secondary | ICD-10-CM | POA: Diagnosis not present

## 2023-07-15 DIAGNOSIS — L97811 Non-pressure chronic ulcer of other part of right lower leg limited to breakdown of skin: Secondary | ICD-10-CM | POA: Diagnosis not present

## 2023-07-15 DIAGNOSIS — I5032 Chronic diastolic (congestive) heart failure: Secondary | ICD-10-CM | POA: Diagnosis not present

## 2023-07-15 DIAGNOSIS — M109 Gout, unspecified: Secondary | ICD-10-CM | POA: Diagnosis not present

## 2023-07-22 DIAGNOSIS — I480 Paroxysmal atrial fibrillation: Secondary | ICD-10-CM | POA: Diagnosis not present

## 2023-07-22 DIAGNOSIS — I110 Hypertensive heart disease with heart failure: Secondary | ICD-10-CM | POA: Diagnosis not present

## 2023-07-22 DIAGNOSIS — M109 Gout, unspecified: Secondary | ICD-10-CM | POA: Diagnosis not present

## 2023-07-22 DIAGNOSIS — L97811 Non-pressure chronic ulcer of other part of right lower leg limited to breakdown of skin: Secondary | ICD-10-CM | POA: Diagnosis not present

## 2023-07-22 DIAGNOSIS — I5032 Chronic diastolic (congestive) heart failure: Secondary | ICD-10-CM | POA: Diagnosis not present

## 2023-07-24 DIAGNOSIS — Z7901 Long term (current) use of anticoagulants: Secondary | ICD-10-CM | POA: Diagnosis not present

## 2023-07-24 DIAGNOSIS — E782 Mixed hyperlipidemia: Secondary | ICD-10-CM | POA: Diagnosis not present

## 2023-07-24 DIAGNOSIS — Z0000 Encounter for general adult medical examination without abnormal findings: Secondary | ICD-10-CM | POA: Diagnosis not present

## 2023-07-24 DIAGNOSIS — M109 Gout, unspecified: Secondary | ICD-10-CM | POA: Diagnosis not present

## 2023-07-24 DIAGNOSIS — E1165 Type 2 diabetes mellitus with hyperglycemia: Secondary | ICD-10-CM | POA: Diagnosis not present

## 2023-07-24 DIAGNOSIS — I10 Essential (primary) hypertension: Secondary | ICD-10-CM | POA: Diagnosis not present

## 2023-07-24 DIAGNOSIS — M810 Age-related osteoporosis without current pathological fracture: Secondary | ICD-10-CM | POA: Diagnosis not present

## 2023-07-24 DIAGNOSIS — G4733 Obstructive sleep apnea (adult) (pediatric): Secondary | ICD-10-CM | POA: Diagnosis not present

## 2023-07-24 DIAGNOSIS — E11319 Type 2 diabetes mellitus with unspecified diabetic retinopathy without macular edema: Secondary | ICD-10-CM | POA: Diagnosis not present

## 2023-07-24 DIAGNOSIS — I872 Venous insufficiency (chronic) (peripheral): Secondary | ICD-10-CM | POA: Diagnosis not present

## 2023-07-24 DIAGNOSIS — Z23 Encounter for immunization: Secondary | ICD-10-CM | POA: Diagnosis not present

## 2023-07-24 DIAGNOSIS — I4891 Unspecified atrial fibrillation: Secondary | ICD-10-CM | POA: Diagnosis not present

## 2023-07-26 ENCOUNTER — Other Ambulatory Visit: Payer: Self-pay | Admitting: Nurse Practitioner

## 2023-07-28 ENCOUNTER — Encounter (INDEPENDENT_AMBULATORY_CARE_PROVIDER_SITE_OTHER): Payer: Medicare Other | Admitting: Ophthalmology

## 2023-07-28 DIAGNOSIS — H353221 Exudative age-related macular degeneration, left eye, with active choroidal neovascularization: Secondary | ICD-10-CM | POA: Diagnosis not present

## 2023-07-28 DIAGNOSIS — H353112 Nonexudative age-related macular degeneration, right eye, intermediate dry stage: Secondary | ICD-10-CM | POA: Diagnosis not present

## 2023-07-28 DIAGNOSIS — I10 Essential (primary) hypertension: Secondary | ICD-10-CM

## 2023-07-28 DIAGNOSIS — H43813 Vitreous degeneration, bilateral: Secondary | ICD-10-CM

## 2023-07-28 DIAGNOSIS — H35033 Hypertensive retinopathy, bilateral: Secondary | ICD-10-CM

## 2023-07-30 ENCOUNTER — Other Ambulatory Visit: Payer: Self-pay | Admitting: Nurse Practitioner

## 2023-07-31 DIAGNOSIS — I5032 Chronic diastolic (congestive) heart failure: Secondary | ICD-10-CM | POA: Diagnosis not present

## 2023-07-31 DIAGNOSIS — I110 Hypertensive heart disease with heart failure: Secondary | ICD-10-CM | POA: Diagnosis not present

## 2023-07-31 DIAGNOSIS — I480 Paroxysmal atrial fibrillation: Secondary | ICD-10-CM | POA: Diagnosis not present

## 2023-07-31 DIAGNOSIS — L97811 Non-pressure chronic ulcer of other part of right lower leg limited to breakdown of skin: Secondary | ICD-10-CM | POA: Diagnosis not present

## 2023-07-31 DIAGNOSIS — M109 Gout, unspecified: Secondary | ICD-10-CM | POA: Diagnosis not present

## 2023-08-05 DIAGNOSIS — I110 Hypertensive heart disease with heart failure: Secondary | ICD-10-CM | POA: Diagnosis not present

## 2023-08-05 DIAGNOSIS — M109 Gout, unspecified: Secondary | ICD-10-CM | POA: Diagnosis not present

## 2023-08-05 DIAGNOSIS — L97819 Non-pressure chronic ulcer of other part of right lower leg with unspecified severity: Secondary | ICD-10-CM | POA: Diagnosis not present

## 2023-08-05 DIAGNOSIS — I5032 Chronic diastolic (congestive) heart failure: Secondary | ICD-10-CM | POA: Diagnosis not present

## 2023-08-05 DIAGNOSIS — L97811 Non-pressure chronic ulcer of other part of right lower leg limited to breakdown of skin: Secondary | ICD-10-CM | POA: Diagnosis not present

## 2023-08-05 DIAGNOSIS — I480 Paroxysmal atrial fibrillation: Secondary | ICD-10-CM | POA: Diagnosis not present

## 2023-08-06 ENCOUNTER — Ambulatory Visit: Payer: Medicare Other | Admitting: Podiatry

## 2023-08-06 ENCOUNTER — Encounter: Payer: Self-pay | Admitting: Podiatry

## 2023-08-06 VITALS — Ht 63.0 in | Wt 275.0 lb

## 2023-08-06 DIAGNOSIS — M2010 Hallux valgus (acquired), unspecified foot: Secondary | ICD-10-CM

## 2023-08-06 DIAGNOSIS — M79675 Pain in left toe(s): Secondary | ICD-10-CM

## 2023-08-06 DIAGNOSIS — B351 Tinea unguium: Secondary | ICD-10-CM

## 2023-08-06 DIAGNOSIS — M79674 Pain in right toe(s): Secondary | ICD-10-CM | POA: Diagnosis not present

## 2023-08-06 DIAGNOSIS — E1142 Type 2 diabetes mellitus with diabetic polyneuropathy: Secondary | ICD-10-CM

## 2023-08-06 NOTE — Progress Notes (Signed)
This patient returns to my office for at risk foot care.  This patient requires this care by a professional since this patient will be at risk due to having type 2 diabetes.  This patient is unable to cut nails herself since the patient cannot reach her nails.These nails are painful walking and wearing shoes.  This patient presents for at risk foot care today.  General Appearance  Alert, conversant and in no acute stress.  Vascular  Dorsalis pedis and posterior tibial  pulses are  weakly palpable  due to swelling.bilaterally.  Capillary return is within normal limits  bilaterally. Temperature is within normal limits  bilaterally.  Neurologic  Senn-Weinstein monofilament wire test diminished  bilaterally. Muscle power within normal limits bilaterally.  Nails Thick disfigured discolored nails with subungual debris  from hallux to fifth toes bilaterally. No evidence of bacterial infection or drainage bilaterally.  Orthopedic  No limitations of motion  feet .  No crepitus or effusions noted.  No bony pathology or digital deformities noted. HAV  B/L.  Skin  normotropic skin with no porokeratosis noted bilaterally.  No signs of infections or ulcers noted.     Onychomycosis  Pain in right toes  Pain in left toes  Consent was obtained for treatment procedures.   Mechanical debridement of nails 1-5  bilaterally performed with a nail nipper.  Filed with dremel without incident. Patient qualifies for diabetic shoes due to DPN and HAV  B/L.   Return office visit    3 months                  Told patient to return for periodic foot care and evaluation due to potential at risk complications.   Helane Gunther DPM

## 2023-08-12 DIAGNOSIS — I5032 Chronic diastolic (congestive) heart failure: Secondary | ICD-10-CM | POA: Diagnosis not present

## 2023-08-12 DIAGNOSIS — M109 Gout, unspecified: Secondary | ICD-10-CM | POA: Diagnosis not present

## 2023-08-12 DIAGNOSIS — L97811 Non-pressure chronic ulcer of other part of right lower leg limited to breakdown of skin: Secondary | ICD-10-CM | POA: Diagnosis not present

## 2023-08-12 DIAGNOSIS — I110 Hypertensive heart disease with heart failure: Secondary | ICD-10-CM | POA: Diagnosis not present

## 2023-08-12 DIAGNOSIS — I480 Paroxysmal atrial fibrillation: Secondary | ICD-10-CM | POA: Diagnosis not present

## 2023-08-19 DIAGNOSIS — Z7901 Long term (current) use of anticoagulants: Secondary | ICD-10-CM | POA: Diagnosis not present

## 2023-08-19 DIAGNOSIS — L03115 Cellulitis of right lower limb: Secondary | ICD-10-CM | POA: Diagnosis not present

## 2023-08-19 DIAGNOSIS — I4891 Unspecified atrial fibrillation: Secondary | ICD-10-CM | POA: Diagnosis not present

## 2023-08-21 DIAGNOSIS — I5032 Chronic diastolic (congestive) heart failure: Secondary | ICD-10-CM | POA: Diagnosis not present

## 2023-08-21 DIAGNOSIS — L97811 Non-pressure chronic ulcer of other part of right lower leg limited to breakdown of skin: Secondary | ICD-10-CM | POA: Diagnosis not present

## 2023-08-21 DIAGNOSIS — I480 Paroxysmal atrial fibrillation: Secondary | ICD-10-CM | POA: Diagnosis not present

## 2023-08-21 DIAGNOSIS — M109 Gout, unspecified: Secondary | ICD-10-CM | POA: Diagnosis not present

## 2023-08-21 DIAGNOSIS — I110 Hypertensive heart disease with heart failure: Secondary | ICD-10-CM | POA: Diagnosis not present

## 2023-08-25 ENCOUNTER — Other Ambulatory Visit: Payer: Self-pay | Admitting: Surgery

## 2023-08-25 ENCOUNTER — Other Ambulatory Visit (HOSPITAL_COMMUNITY): Payer: Self-pay

## 2023-08-25 DIAGNOSIS — I712 Thoracic aortic aneurysm, without rupture, unspecified: Secondary | ICD-10-CM

## 2023-08-25 MED ORDER — METOPROLOL SUCCINATE ER 25 MG PO TB24: 12.5000 mg | ORAL_TABLET | Freq: Every day | ORAL | 1 refills | Status: DC

## 2023-08-26 DIAGNOSIS — M109 Gout, unspecified: Secondary | ICD-10-CM | POA: Diagnosis not present

## 2023-08-26 DIAGNOSIS — I480 Paroxysmal atrial fibrillation: Secondary | ICD-10-CM | POA: Diagnosis not present

## 2023-08-26 DIAGNOSIS — L97811 Non-pressure chronic ulcer of other part of right lower leg limited to breakdown of skin: Secondary | ICD-10-CM | POA: Diagnosis not present

## 2023-08-26 DIAGNOSIS — I110 Hypertensive heart disease with heart failure: Secondary | ICD-10-CM | POA: Diagnosis not present

## 2023-08-26 DIAGNOSIS — I5032 Chronic diastolic (congestive) heart failure: Secondary | ICD-10-CM | POA: Diagnosis not present

## 2023-08-27 ENCOUNTER — Encounter (INDEPENDENT_AMBULATORY_CARE_PROVIDER_SITE_OTHER): Payer: Medicare Other | Admitting: Ophthalmology

## 2023-08-27 DIAGNOSIS — I10 Essential (primary) hypertension: Secondary | ICD-10-CM

## 2023-08-27 DIAGNOSIS — L97211 Non-pressure chronic ulcer of right calf limited to breakdown of skin: Secondary | ICD-10-CM | POA: Diagnosis not present

## 2023-08-27 DIAGNOSIS — H353112 Nonexudative age-related macular degeneration, right eye, intermediate dry stage: Secondary | ICD-10-CM | POA: Diagnosis not present

## 2023-08-27 DIAGNOSIS — I872 Venous insufficiency (chronic) (peripheral): Secondary | ICD-10-CM | POA: Diagnosis not present

## 2023-08-27 DIAGNOSIS — I4891 Unspecified atrial fibrillation: Secondary | ICD-10-CM | POA: Diagnosis not present

## 2023-08-27 DIAGNOSIS — H35372 Puckering of macula, left eye: Secondary | ICD-10-CM

## 2023-08-27 DIAGNOSIS — H43813 Vitreous degeneration, bilateral: Secondary | ICD-10-CM | POA: Diagnosis not present

## 2023-08-27 DIAGNOSIS — H353221 Exudative age-related macular degeneration, left eye, with active choroidal neovascularization: Secondary | ICD-10-CM | POA: Diagnosis not present

## 2023-08-27 DIAGNOSIS — H35033 Hypertensive retinopathy, bilateral: Secondary | ICD-10-CM

## 2023-09-01 ENCOUNTER — Other Ambulatory Visit: Payer: Medicare Other

## 2023-09-02 DIAGNOSIS — L97811 Non-pressure chronic ulcer of other part of right lower leg limited to breakdown of skin: Secondary | ICD-10-CM | POA: Diagnosis not present

## 2023-09-02 DIAGNOSIS — I110 Hypertensive heart disease with heart failure: Secondary | ICD-10-CM | POA: Diagnosis not present

## 2023-09-02 DIAGNOSIS — I480 Paroxysmal atrial fibrillation: Secondary | ICD-10-CM | POA: Diagnosis not present

## 2023-09-02 DIAGNOSIS — I5032 Chronic diastolic (congestive) heart failure: Secondary | ICD-10-CM | POA: Diagnosis not present

## 2023-09-02 DIAGNOSIS — M109 Gout, unspecified: Secondary | ICD-10-CM | POA: Diagnosis not present

## 2023-09-03 ENCOUNTER — Other Ambulatory Visit: Payer: Medicare Other

## 2023-09-03 ENCOUNTER — Ambulatory Visit (HOSPITAL_COMMUNITY)
Admission: RE | Admit: 2023-09-03 | Discharge: 2023-09-03 | Disposition: A | Discharge status: Home / Self Care | Payer: Medicare Other | Source: Ambulatory Visit | Attending: Internal Medicine | Admitting: Internal Medicine

## 2023-09-03 VITALS — BP 136/64 | HR 62 | Ht 63.0 in | Wt 266.0 lb

## 2023-09-03 DIAGNOSIS — Z5181 Encounter for therapeutic drug level monitoring: Secondary | ICD-10-CM | POA: Diagnosis not present

## 2023-09-03 DIAGNOSIS — Z79899 Other long term (current) drug therapy: Secondary | ICD-10-CM | POA: Insufficient documentation

## 2023-09-03 DIAGNOSIS — Z7901 Long term (current) use of anticoagulants: Secondary | ICD-10-CM | POA: Diagnosis not present

## 2023-09-03 DIAGNOSIS — G4733 Obstructive sleep apnea (adult) (pediatric): Secondary | ICD-10-CM | POA: Diagnosis not present

## 2023-09-03 DIAGNOSIS — I4819 Other persistent atrial fibrillation: Secondary | ICD-10-CM | POA: Diagnosis not present

## 2023-09-03 DIAGNOSIS — D6869 Other thrombophilia: Secondary | ICD-10-CM | POA: Diagnosis not present

## 2023-09-03 DIAGNOSIS — E669 Obesity, unspecified: Secondary | ICD-10-CM | POA: Diagnosis not present

## 2023-09-03 DIAGNOSIS — I5032 Chronic diastolic (congestive) heart failure: Secondary | ICD-10-CM | POA: Insufficient documentation

## 2023-09-03 DIAGNOSIS — I4891 Unspecified atrial fibrillation: Secondary | ICD-10-CM | POA: Diagnosis not present

## 2023-09-03 DIAGNOSIS — I110 Hypertensive heart disease with heart failure: Secondary | ICD-10-CM | POA: Diagnosis not present

## 2023-09-03 LAB — BASIC METABOLIC PANEL
Anion gap: 10 (ref 5–15)
BUN: 18 mg/dL (ref 8–23)
CO2: 26 mmol/L (ref 22–32)
Calcium: 9.7 mg/dL (ref 8.9–10.3)
Chloride: 104 mmol/L (ref 98–111)
Creatinine, Ser: 0.7 mg/dL (ref 0.44–1.00)
GFR, Estimated: >60 mL/min (ref >60)
Glucose, Bld: 100 mg/dL — ABNORMAL HIGH (ref 70–99)
Potassium: 4.4 mmol/L (ref 3.5–5.1)
Sodium: 140 mmol/L (ref 135–145)

## 2023-09-03 LAB — MAGNESIUM: Magnesium: 1.9 mg/dL (ref 1.7–2.4)
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
## 2023-09-03 NOTE — Patient Instructions (Addendum)
Great to see you today!!!  Continue current medications  Your physician recommends that you schedule a follow-up appointment in: 6 months

## 2023-09-08 ENCOUNTER — Ambulatory Visit: Payer: Medicare Other

## 2023-09-08 DIAGNOSIS — E1142 Type 2 diabetes mellitus with diabetic polyneuropathy: Secondary | ICD-10-CM

## 2023-09-08 DIAGNOSIS — L84 Corns and callosities: Secondary | ICD-10-CM

## 2023-09-08 DIAGNOSIS — M2010 Hallux valgus (acquired), unspecified foot: Secondary | ICD-10-CM

## 2023-09-08 DIAGNOSIS — M2041 Other hammer toe(s) (acquired), right foot: Secondary | ICD-10-CM

## 2023-09-08 DIAGNOSIS — M7741 Metatarsalgia, right foot: Secondary | ICD-10-CM

## 2023-09-08 DIAGNOSIS — M21619 Bunion of unspecified foot: Secondary | ICD-10-CM

## 2023-09-08 NOTE — Progress Notes (Signed)
Patient presents to the office today for diabetic shoe and insole measuring.  Patient was measured with brannock device to determine size and width for 1 pair of extra depth shoes and foam casted for 3 pair of insoles.   Documentation of medical necessity will be sent to patient's treating diabetic doctor to verify and sign.   Patient's diabetic provider: Eliane Decree PA -Dr Sharl Ma office   Shoes and insoles will be ordered at that time and patient will be notified for an appointment for fitting when they arrive.   Shoe size (per patient): 11 Brannock measurement: 11 Patient shoe selection- Shoe choice:   [id number] /825 Shoe size ordered: 11XWD [employer] signed  Natasha Glass Cped, CFo, CFm

## 2023-09-16 NOTE — Patient Instructions (Signed)
Patient is counseled regarding the importance of long term risk factor modification as they pertain to the presence of ischemic heart disease including avoiding the use of all tobacco products, dietary modifications and medical therapy for diabetes, cholesterol and lipid management, and regular exercise.     AVOID FLOUROQUINOLONES AS THESE CAN INCREASE RISK OF AORTIC DISSECTION (ex: Ciprofloxacin)

## 2023-09-16 NOTE — Progress Notes (Signed)
[address]       [place] [address]             [phone]   Natasha Glass [id number] 22-May-1941  History of Present Illness:  Natasha Glass is an 82 yo obese female with history of DM II, OSA with CPAP, Arthritis, DVT, Gerd, Obesity, anemia, HTN, Atrial Fibrillation, and Ascending Aortic Aneurysm.  She was last seen in our office in December of 2023.  In August of this year she was admitted with COVID 19 infection and Atrial Fibrillation with RVR.  She presents for surveillance of her Ascending Aortic Aneurysm.  CT scan obtained during hospitalization in August of this year showed her Aneurysm to be stable in appearance.  She questions why we keep monitoring this aneurysm.  She states she wouldn't agree to surgery anyway if we offered.  She has been having a lot of issues lately.  She was recently transitioned to Eliquis in November from coumadin.  She is complaining of a sore on her right ankle that is not healing and she says its been presents since she left the hospital.  She is worried that her leg will need to be ambulated if she goes to the wound care center.  She has light headedness with ambulation.  She also gets short of breath with ambulation.  She denies chest pain.  She states she just doesn't have any energy and can't even find someone to clean her house.    Current Outpatient Medications on File Prior to Visit  Medication Sig Dispense Refill   acetaminophen (TYLENOL) 500 MG tablet Take 500-1,000 mg by mouth at bedtime.     albuterol (VENTOLIN HFA) 108 (90 Base) MCG/ACT inhaler Inhale 2 puffs into the lungs every 6 (six) hours as needed for wheezing or shortness of breath. 6.7 g 0   allopurinol (ZYLOPRIM) 300 MG tablet Take 0.5 tablets (150 mg total) by mouth every evening. 15 tablet 0   Cholecalciferol (VITAMIN D3) 25 MCG (1000 UT) CAPS Take 2,000 Units by mouth in the morning.     clobetasol cream (TEMOVATE) 0.05 % as needed (rash).      Dextromethorphan-guaiFENesin 10-100 MG/5ML liquid Take 10 mLs by mouth every 4 (four) hours as needed for cough (Patient taking differently: Take 10 mLs by mouth as needed.) 237 mL 0   dofetilide (TIKOSYN) 250 MCG capsule Take 1 capsule (250 mcg total) by mouth 2 (two) times daily. 180 capsule 2   ELIQUIS 5 MG TABS tablet Take 5 mg by mouth 2 (two) times daily.     Hyprom-Naphaz-Polysorb-Zn Sulf (CLEAR EYES COMPLETE) SOLN as needed (itching).     losartan (COZAAR) 25 MG tablet Take 1 tablet (25 mg total) by mouth daily. 30 tablet 0   magnesium oxide (MAG-OX) 400 MG tablet Take 1 tablet (400 mg total) by mouth at bedtime. (Patient taking differently: Take 250 mg by mouth at bedtime.) 30 tablet 0   metFORMIN (GLUCOPHAGE) 500 MG tablet Take 1 tablet (500 mg total) by mouth 2 (two) times daily with a meal. 60 tablet 0   metoprolol succinate (TOPROL-XL) 25 MG 24 hr tablet Take 0.5 tablets (12.5 mg total) by mouth at bedtime. 45 tablet 1   Multiple Vitamins-Minerals (PRESERVISION AREDS 2) CAPS Take 2 capsules by mouth daily with lunch.     potassium chloride SA (KLOR-CON M) 20 MEQ tablet Take 1 tablet (20 mEq total) by mouth in the morning.     rosuvastatin (  CRESTOR) 5 MG tablet Take 5 mg by mouth in the morning.     torsemide (DEMADEX) 20 MG tablet Take 1 tablet by mouth twice daily 180 tablet 0   vitamin B-12 (CYANOCOBALAMIN) 500 MCG tablet Take 500 mcg by mouth in the morning.     No current facility-administered medications on file prior to visit.    Physical Exam  BP 135/71   Pulse 90   Resp 20   Ht 5\' 3"  (1.6 m)   Wt 266 lb (120.7 kg)   SpO2 93% Comment: RA  BMI 47.12 kg/m   Gen: NAD, morbid obesity Heart: IRRR Lungs: CTA bilaterally Ext: bilateral venous stasis changes, RLE with 4-5 cm ulceration that has been present for months and is not healing Neuro: grossly normal  CTA Results:  FINDINGS: Cardiovascular: Cardiomegaly. No pericardial effusion. Thoracic aorta is well  opacified. No dissection. Mid ascending thoracic aortic aneurysm measures approximately 4.5 x 4.3 cm in diameter, not appreciably changed compared to the prior study. Scattered atherosclerotic vascular calcifications of the aorta and coronary arteries. Pulmonary trunk measures 3.4 cm in diameter. No central pulmonary arterial filling defects.   Mediastinum/Nodes: No enlarged mediastinal, hilar, or axillary lymph nodes. Thyroid gland, trachea, and esophagus demonstrate no significant findings.   Lungs/Pleura: Mild scarring within the lingula. Mild bronchial wall thickening within the lower lobes. No focal airspace consolidation. No pleural effusion or pneumothorax.   Upper Abdomen: Stable right adrenal adenoma which requires no follow-up imaging. No acute abnormality.   Musculoskeletal: No chest wall abnormality. No acute or significant osseous findings.   Review of the MIP images confirms the above findings.   IMPRESSION: 1. Stable 4.5 cm ascending thoracic aortic aneurysm. Ascending thoracic aortic aneurysm. Recommend semi-annual imaging followup by CTA or MRA and referral to cardiothoracic surgery if not already obtained. This recommendation follows 2010 ACCF/AHA/AATS/ACR/ASA/SCA/SCAI/SIR/STS/SVM Guidelines for the Diagnosis and Management of Patients With Thoracic Aortic Disease. Circulation. 2010; 121: Z610-R604. Aortic aneurysm NOS (ICD10-I71.9) 2. Mild bronchial wall thickening within the lower lobes, which may represent bronchitis. 3. Aortic and coronary artery atherosclerosis (ICD10-I70.0).     Electronically Signed   By: Duanne Guess D.O.   On: 09/29/2023 13:59  A/P:  Ascending Aortic Aneurysm- CT scan shows stable appearance of aneurysm measuring 4.5 cm- patient will not be a candidate for surgical intervention even if this would approach surgical size.  She states she would also not be interested in surgery if offered Atrial Fibrillation with RVR- on Eliquis,  Tikosyn HTN- continue Cozaar HLD- on Crestor Non-healing wound RLE, this has been present for several months, has not healed with antibiotics.. patient instructed to follow up with primary with likely vascular workup or referral to wound clinic.. she is fearful of losing leg RTC prn- patient will contact our office should she want to repeat scan in the future    Risk Modification:   Patient was counseled on importance of Blood Pressure Control.  Despite Medical intervention if the patient notices persistently elevated blood pressure readings.  They are instructed to contact their Primary Care Physician  Please avoid use of Fluoroquinolones as this can potentially increase your risk of Aortic Rupture and/or Dissection  Patient educated on signs and symptoms of Aortic Dissection, handout also provided in AVS  Kimbly Eanes, PA-C 09/16/23

## 2023-09-19 DIAGNOSIS — M109 Gout, unspecified: Secondary | ICD-10-CM | POA: Diagnosis not present

## 2023-09-19 DIAGNOSIS — L97811 Non-pressure chronic ulcer of other part of right lower leg limited to breakdown of skin: Secondary | ICD-10-CM | POA: Diagnosis not present

## 2023-09-19 DIAGNOSIS — I5032 Chronic diastolic (congestive) heart failure: Secondary | ICD-10-CM | POA: Diagnosis not present

## 2023-09-19 DIAGNOSIS — I110 Hypertensive heart disease with heart failure: Secondary | ICD-10-CM | POA: Diagnosis not present

## 2023-09-19 DIAGNOSIS — I480 Paroxysmal atrial fibrillation: Secondary | ICD-10-CM | POA: Diagnosis not present

## 2023-09-24 ENCOUNTER — Encounter (INDEPENDENT_AMBULATORY_CARE_PROVIDER_SITE_OTHER): Payer: Medicare Other | Admitting: Ophthalmology

## 2023-09-24 DIAGNOSIS — H43813 Vitreous degeneration, bilateral: Secondary | ICD-10-CM | POA: Diagnosis not present

## 2023-09-24 DIAGNOSIS — H35033 Hypertensive retinopathy, bilateral: Secondary | ICD-10-CM

## 2023-09-24 DIAGNOSIS — H353112 Nonexudative age-related macular degeneration, right eye, intermediate dry stage: Secondary | ICD-10-CM

## 2023-09-24 DIAGNOSIS — H353221 Exudative age-related macular degeneration, left eye, with active choroidal neovascularization: Secondary | ICD-10-CM

## 2023-09-24 DIAGNOSIS — H35372 Puckering of macula, left eye: Secondary | ICD-10-CM

## 2023-09-24 DIAGNOSIS — I10 Essential (primary) hypertension: Secondary | ICD-10-CM | POA: Diagnosis not present

## 2023-09-28 ENCOUNTER — Other Ambulatory Visit (HOSPITAL_COMMUNITY): Payer: Self-pay | Admitting: Internal Medicine

## 2023-09-29 ENCOUNTER — Ambulatory Visit: Payer: Medicare Other | Admitting: Physician Assistant

## 2023-09-29 ENCOUNTER — Ambulatory Visit
Admission: RE | Admit: 2023-09-29 | Discharge: 2023-09-29 | Disposition: A | Discharge status: Home / Self Care | Payer: Medicare Other | Source: Ambulatory Visit | Attending: Surgery | Admitting: Surgery

## 2023-09-29 VITALS — BP 135/71 | HR 90 | Resp 20 | Ht 63.0 in | Wt 266.0 lb

## 2023-09-29 DIAGNOSIS — I712 Thoracic aortic aneurysm, without rupture, unspecified: Secondary | ICD-10-CM

## 2023-09-29 DIAGNOSIS — I7121 Aneurysm of the ascending aorta, without rupture: Secondary | ICD-10-CM | POA: Diagnosis not present

## 2023-09-29 MED ORDER — IOPAMIDOL (ISOVUE-370) INJECTION 76%
500.0000 mL | Freq: Once | INTRAVENOUS | Status: AC | PRN
  Administered 2023-09-29: 75 mL via INTRAVENOUS

## 2023-09-30 ENCOUNTER — Other Ambulatory Visit (HOSPITAL_COMMUNITY): Payer: Self-pay | Admitting: *Deleted

## 2023-09-30 MED ORDER — DOFETILIDE 250 MCG PO CAPS: 250.0000 ug | ORAL_CAPSULE | Freq: Two times a day (BID) | ORAL | 1 refills | Status: DC

## 2023-10-17 ENCOUNTER — Other Ambulatory Visit: Payer: Self-pay | Admitting: Cardiology

## 2023-10-17 ENCOUNTER — Telehealth: Payer: Self-pay | Admitting: Cardiology

## 2023-10-17 DIAGNOSIS — I480 Paroxysmal atrial fibrillation: Secondary | ICD-10-CM

## 2023-10-17 MED ORDER — ELIQUIS 5 MG PO TABS
5.0000 mg | ORAL_TABLET | Freq: Two times a day (BID) | ORAL | 1 refills | Status: DC
Start: 2023-10-17 — End: 2024-01-02

## 2023-10-17 MED ORDER — TORSEMIDE 20 MG PO TABS: 20.0000 mg | ORAL_TABLET | Freq: Two times a day (BID) | ORAL | 2 refills | Status: DC

## 2023-10-17 NOTE — Telephone Encounter (Signed)
 Pt's medication was sent to pt's pharmacy as requested. Confirmation received.

## 2023-10-17 NOTE — Telephone Encounter (Signed)
 Called and spoke to patient. Verified name and DOB. Patient stated she will be out of Eliquis  in 2 days. Informed her we did not have samples in the office at this time. She report [facility] wanted  $300 for 90 day supply. She called [facility] and the will do 90 supply for $147. Advised her to call [facility] and ask them to cancel the refill so that [facility] can rerun the prescriptions. Also told her we have a 30 day free trial card she can come by and pick up on Monday that will last her until she can get her medication from [facility]. Patient verbalized understanding and agree.

## 2023-10-17 NOTE — Telephone Encounter (Signed)
*  STAT* If patient is at the pharmacy, call can be transferred to refill team.   1. Which medications need to be refilled? (please list name of each medication and dose if known) ELIQUIS  5 MG TABS tablet   torsemide  (DEMADEX ) 20 MG tablet   2. Which pharmacy/location (including street and city if local pharmacy) is medication to be sent to? [facility] - [place], [place] - [address] [place]   3. Do they need a 30 day or 90 day supply? 90  Will be out of this medication in two days.

## 2023-10-17 NOTE — Telephone Encounter (Signed)
 Eliquis 5mg  refill request received. Patient is 83 years old, weight-120.7kg, Crea-0.70 on 09/03/23, Diagnosis-Afib, and last seen by [facility] on 09/03/23. Dose is appropriate based on dosing criteria. Will send in refill to requested pharmacy.

## 2023-10-17 NOTE — Telephone Encounter (Signed)
 Patient calling the office for samples of medication:   1.  What medication and dosage are you requesting samples for?   ELIQUIS  5 MG TABS tablet    2.  Are you currently out of this medication?   Will be out in two days. Put in request for [facility] for cheaper amount for 90. Patient will be out before she is able to get this in the mail. Requesting call back.

## 2023-10-22 ENCOUNTER — Encounter (INDEPENDENT_AMBULATORY_CARE_PROVIDER_SITE_OTHER): Payer: Medicare Other | Admitting: Ophthalmology

## 2023-10-22 DIAGNOSIS — I10 Essential (primary) hypertension: Secondary | ICD-10-CM | POA: Diagnosis not present

## 2023-10-22 DIAGNOSIS — H353221 Exudative age-related macular degeneration, left eye, with active choroidal neovascularization: Secondary | ICD-10-CM

## 2023-10-22 DIAGNOSIS — H43813 Vitreous degeneration, bilateral: Secondary | ICD-10-CM

## 2023-10-22 DIAGNOSIS — H35033 Hypertensive retinopathy, bilateral: Secondary | ICD-10-CM

## 2023-10-22 DIAGNOSIS — H35372 Puckering of macula, left eye: Secondary | ICD-10-CM | POA: Diagnosis not present

## 2023-10-22 DIAGNOSIS — H353112 Nonexudative age-related macular degeneration, right eye, intermediate dry stage: Secondary | ICD-10-CM | POA: Diagnosis not present

## 2023-10-24 ENCOUNTER — Other Ambulatory Visit: Payer: Self-pay

## 2023-10-24 MED ORDER — TORSEMIDE 20 MG PO TABS: 20.0000 mg | ORAL_TABLET | Freq: Two times a day (BID) | ORAL | 2 refills | Status: DC

## 2023-10-31 ENCOUNTER — Telehealth: Payer: Self-pay

## 2023-10-31 MED ORDER — FUROSEMIDE 40 MG PO TABS
ORAL_TABLET | ORAL | 3 refills | Status: AC
Start: 2023-10-31 — End: ?

## 2023-10-31 NOTE — Telephone Encounter (Signed)
Spoke with pt, she does report that after taking the torsemide she will have pretty bad vagina pain. She would like to change back to furosemide. Patient made aware furosemide also has sulfa. She reports she tolerated the furosemide when she was on it. She was previously taking 40 mg once daily with an extra tablet as needed. Will forward for dr Jens Som review

## 2023-10-31 NOTE — Telephone Encounter (Signed)
[facility] mail order pharmacy is needing a clarification for medication torsemide. Pt is stating that pt has indicated an allergy to sulfonamides and has been prescribed Demadex tablets 20 mg. Please verify if the prescriber is aware of the allergy/potential cross-sensitivity. Order# [id number]  Ph# [phone]. Please address

## 2023-10-31 NOTE — Telephone Encounter (Signed)
Spoke with pt, Aware of dr Ludwig Clarks recommendations. Spoke with [facility], new prescription for furosemide 40 mg 1.5 tablet once daily and may take extra 1.5 tablets as needed for swelling.

## 2023-11-07 ENCOUNTER — Ambulatory Visit: Payer: Medicare Other | Admitting: Podiatry

## 2023-11-07 ENCOUNTER — Telehealth: Payer: Self-pay

## 2023-11-07 ENCOUNTER — Encounter: Payer: Self-pay | Admitting: Podiatry

## 2023-11-07 DIAGNOSIS — B351 Tinea unguium: Secondary | ICD-10-CM | POA: Diagnosis not present

## 2023-11-07 DIAGNOSIS — E1142 Type 2 diabetes mellitus with diabetic polyneuropathy: Secondary | ICD-10-CM

## 2023-11-07 DIAGNOSIS — L03115 Cellulitis of right lower limb: Secondary | ICD-10-CM | POA: Diagnosis not present

## 2023-11-07 DIAGNOSIS — M79674 Pain in right toe(s): Secondary | ICD-10-CM | POA: Diagnosis not present

## 2023-11-07 DIAGNOSIS — M79675 Pain in left toe(s): Secondary | ICD-10-CM

## 2023-11-07 NOTE — Telephone Encounter (Signed)
Left pt VM to update on her shoes. I have her inserts just waiting on her shoes to come in.

## 2023-11-07 NOTE — Progress Notes (Signed)
This patient returns to my office for at risk foot care.  This patient requires this care by a professional since this patient will be at risk due to having type 2 diabetes.  This patient is unable to cut nails herself since the patient cannot reach her nails.These nails are painful walking and wearing shoes.  This patient presents for at risk foot care today.  General Appearance  Alert, conversant and in no acute stress.  Vascular  Dorsalis pedis and posterior tibial  pulses are  weakly palpable  due to swelling.bilaterally.  Capillary return is within normal limits  bilaterally. Temperature is within normal limits  bilaterally.  Neurologic  Senn-Weinstein monofilament wire test diminished  bilaterally. Muscle power within normal limits bilaterally.  Nails Thick disfigured discolored nails with subungual debris  from hallux to fifth toes bilaterally. No evidence of bacterial infection or drainage bilaterally.  Orthopedic  No limitations of motion  feet .  No crepitus or effusions noted.  No bony pathology or digital deformities noted. HAV  B/L.  Skin  normotropic skin with no porokeratosis noted bilaterally.  No signs of infections or ulcers noted.     Onychomycosis  Pain in right toes  Pain in left toes  Consent was obtained for treatment procedures.   Mechanical debridement of nails 1-5  bilaterally performed with a nail nipper.  Filed with dremel without incident. Patient qualifies for diabetic shoes due to DPN and HAV  B/L.   Return office visit    3 months                  Told patient to return for periodic foot care and evaluation due to potential at risk complications.   Helane Gunther DPM

## 2023-11-11 DIAGNOSIS — L97219 Non-pressure chronic ulcer of right calf with unspecified severity: Secondary | ICD-10-CM | POA: Diagnosis not present

## 2023-11-18 ENCOUNTER — Telehealth: Payer: Self-pay

## 2023-11-18 DIAGNOSIS — T148XXA Other injury of unspecified body region, initial encounter: Secondary | ICD-10-CM | POA: Diagnosis not present

## 2023-11-18 DIAGNOSIS — I872 Venous insufficiency (chronic) (peripheral): Secondary | ICD-10-CM | POA: Diagnosis not present

## 2023-11-18 NOTE — Telephone Encounter (Signed)
 Spoke with pt tried to make an appt she was very upset, and said this might be her last time purchasing shoes with us  bc she was supposed to get them back in December. I told her I would speak with tricia to see if she can fit her in next Tuesday 11th or the 18th, bc that is when she will be back in [place] bc she is 30 mins from here.

## 2023-11-19 ENCOUNTER — Encounter (INDEPENDENT_AMBULATORY_CARE_PROVIDER_SITE_OTHER): Payer: Medicare Other | Admitting: Ophthalmology

## 2023-11-19 DIAGNOSIS — I10 Essential (primary) hypertension: Secondary | ICD-10-CM

## 2023-11-19 DIAGNOSIS — H353221 Exudative age-related macular degeneration, left eye, with active choroidal neovascularization: Secondary | ICD-10-CM | POA: Diagnosis not present

## 2023-11-19 DIAGNOSIS — H43813 Vitreous degeneration, bilateral: Secondary | ICD-10-CM

## 2023-11-19 DIAGNOSIS — H353112 Nonexudative age-related macular degeneration, right eye, intermediate dry stage: Secondary | ICD-10-CM

## 2023-11-19 DIAGNOSIS — H35033 Hypertensive retinopathy, bilateral: Secondary | ICD-10-CM | POA: Diagnosis not present

## 2023-11-24 DIAGNOSIS — I878 Other specified disorders of veins: Secondary | ICD-10-CM | POA: Diagnosis not present

## 2023-11-24 DIAGNOSIS — T148XXA Other injury of unspecified body region, initial encounter: Secondary | ICD-10-CM | POA: Diagnosis not present

## 2023-12-02 DIAGNOSIS — T148XXA Other injury of unspecified body region, initial encounter: Secondary | ICD-10-CM | POA: Diagnosis not present

## 2023-12-02 DIAGNOSIS — R000 Tachycardia, unspecified: Secondary | ICD-10-CM | POA: Diagnosis not present

## 2023-12-02 DIAGNOSIS — I878 Other specified disorders of veins: Secondary | ICD-10-CM | POA: Diagnosis not present

## 2023-12-08 ENCOUNTER — Encounter: Payer: Self-pay | Admitting: Nurse Practitioner

## 2023-12-08 ENCOUNTER — Ambulatory Visit (INDEPENDENT_AMBULATORY_CARE_PROVIDER_SITE_OTHER): Payer: Medicare Other

## 2023-12-08 ENCOUNTER — Ambulatory Visit: Payer: Medicare Other | Attending: Nurse Practitioner | Admitting: Nurse Practitioner

## 2023-12-08 VITALS — BP 120/70 | HR 130 | Ht 63.0 in | Wt 265.0 lb

## 2023-12-08 DIAGNOSIS — M2142 Flat foot [pes planus] (acquired), left foot: Secondary | ICD-10-CM

## 2023-12-08 DIAGNOSIS — M2041 Other hammer toe(s) (acquired), right foot: Secondary | ICD-10-CM | POA: Diagnosis not present

## 2023-12-08 DIAGNOSIS — I7121 Aneurysm of the ascending aorta, without rupture: Secondary | ICD-10-CM | POA: Diagnosis not present

## 2023-12-08 DIAGNOSIS — M2042 Other hammer toe(s) (acquired), left foot: Secondary | ICD-10-CM

## 2023-12-08 DIAGNOSIS — I480 Paroxysmal atrial fibrillation: Secondary | ICD-10-CM | POA: Diagnosis not present

## 2023-12-08 DIAGNOSIS — M2141 Flat foot [pes planus] (acquired), right foot: Secondary | ICD-10-CM | POA: Diagnosis not present

## 2023-12-08 DIAGNOSIS — I5032 Chronic diastolic (congestive) heart failure: Secondary | ICD-10-CM

## 2023-12-08 DIAGNOSIS — M21619 Bunion of unspecified foot: Secondary | ICD-10-CM | POA: Diagnosis not present

## 2023-12-08 DIAGNOSIS — E119 Type 2 diabetes mellitus without complications: Secondary | ICD-10-CM | POA: Diagnosis not present

## 2023-12-08 DIAGNOSIS — L84 Corns and callosities: Secondary | ICD-10-CM

## 2023-12-08 DIAGNOSIS — I10 Essential (primary) hypertension: Secondary | ICD-10-CM | POA: Diagnosis not present

## 2023-12-08 DIAGNOSIS — E1142 Type 2 diabetes mellitus with diabetic polyneuropathy: Secondary | ICD-10-CM | POA: Diagnosis not present

## 2023-12-08 DIAGNOSIS — G4733 Obstructive sleep apnea (adult) (pediatric): Secondary | ICD-10-CM | POA: Diagnosis not present

## 2023-12-08 NOTE — Progress Notes (Signed)
 Office Visit    Patient Name: Natasha Glass Date of Encounter: 12/08/2023  Primary Care Provider:  [name], PA Primary Cardiologist:  [name], MD  Chief Complaint    83 year old female with a history of paroxysmal atrial fibrillation, chronic diastolic heart failure, hypertension, ascending aortic aneurysm, type 2 diabetes, OSA, DVT/PE, GERD, and gout who presents for follow-up related to heart failure and atrial fibrillation.   Past Medical History    Past Medical History:  Diagnosis Date   Anemia    Arthritis    CHF (congestive heart failure) (HCC)    DVT (deep venous thrombosis) (HCC) 01/2009   Dysrhythmia    GERD (gastroesophageal reflux disease)    Gout    History of blood transfusion    1990's   History of colonic polyps 09/2010   History of pneumonia    Hyperlipidemia    Hypertension    Lactose intolerance    Macular degeneration, dry    bilateral   Migraine    Obesity    Osteopenia    PAF (paroxysmal atrial fibrillation) (HCC)    Peripheral neuropathy    Pulmonary embolism (HCC) 01/2009   Sleep apnea    CPAP pressure 4.0-16.0   Thoracic ascending aortic aneurysm (HCC)    Type II diabetes mellitus (HCC)    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)    Urinary incontinence    Past Surgical History:  Procedure Laterality Date   ABDOMINAL EXPLORATION SURGERY  1960   ABDOMINAL HYSTERECTOMY  1986   CARDIAC CATHETERIZATION     CARDIOVERSION N/A 05/07/2018   Procedure: CARDIOVERSION;  Surgeon: Wendall Stade, MD;  Location: [facility] ENDOSCOPY;  Service: Cardiovascular;  Laterality: N/A;   COLONOSCOPY W/ BIOPSIES AND POLYPECTOMY  2005; 2011; 2012   COLONOSCOPY WITH PROPOFOL N/A 09/10/2016   Procedure: COLONOSCOPY WITH PROPOFOL;  Surgeon: Charolett Bumpers, MD;  Location: [facility] ENDOSCOPY;  Service: Endoscopy;  Laterality: N/A;   JOINT REPLACEMENT     KNEE ARTHROSCOPY Left ~ 1999   SHOULDER OPEN ROTATOR CUFF REPAIR Left 2011   TEE WITHOUT  CARDIOVERSION N/A 08/13/2022   Procedure: TRANSESOPHAGEAL ECHOCARDIOGRAM (TEE);  Surgeon: Quintella Reichert, MD;  Location: [facility] ENDOSCOPY;  Service: Cardiovascular;  Laterality: N/A;   TOTAL KNEE ARTHROPLASTY Bilateral 2005-2012   left-right    Allergies  Allergies  Allergen Reactions   Bactrim [Sulfamethoxazole-Trimethoprim]     Drop in BP   Morphine And Codeine Other (See Comments)    Unresponsive-Very bad reaction.  Can take hydrocodone.    Oysters [Shellfish Allergy] Shortness Of Breath, Itching, Swelling and Rash   Amlodipine Swelling and Other (See Comments)    Of legs   Percocet [Oxycodone-Acetaminophen] Other (See Comments)    hallucinations   Tdap [Tetanus-Diphth-Acell Pertussis] Itching, Swelling and Rash   Tetanus Toxoids Itching, Swelling and Rash   Tramadol Swelling and Other (See Comments)    Of legs   Lactose Intolerance (Gi) Other (See Comments)   Other Other (See Comments)   Tegretol [Carbamazepine] Other (See Comments)    unknown   Tetanus-Diphtheria Toxoids Td Other (See Comments)   Topiramate Other (See Comments)   Diovan [Valsartan] Other (See Comments)    Cough   Diphtheria Toxoid Itching, Swelling and Rash   Macrodantin [Nitrofurantoin Macrocrystal] Other (See Comments)    unspecified     Labs/Other Studies Reviewed    The following studies were reviewed today:  Cardiac Studies & Procedures   ______________________________________________________________________________________________   STRESS TESTS  NM MYOCAR  MULTI W/SPECT W 03/18/2008   ECHOCARDIOGRAM  ECHOCARDIOGRAM COMPLETE 05/01/2022  Narrative ECHOCARDIOGRAM REPORT    Patient Name:   Natasha Glass Date of Exam: 05/01/2022 Medical Rec #:  [id number]      Height:       63.0 in Accession #:    [id number]     Weight:       280.9 lb Date of Birth:  [birth year]     BSA:          2.234 m Patient Age:    80 years       BP:           102/68 mmHg Patient Gender: F              HR:            80 bpm. Exam Location:  Inpatient  Procedure: 2D Echo, Cardiac Doppler and Color Doppler  Indications:    I48.91* Unspeicified atrial fibrillation  History:        Patient has prior history of Echocardiogram examinations, most recent 06/02/2017. Abnormal ECG, Arrythmias:Atrial Fibrillation, Signs/Symptoms:Chest Pain; Risk Factors:Sleep Apnea, Diabetes and Hypertension.  Sonographer:    Sheralyn Boatman RDCS Referring Phys: Deno Lunger [facility]   Sonographer Comments: Patient is morbidly obese. Image acquisition challenging due to patient body habitus. IMPRESSIONS   1. Left ventricular ejection fraction, by estimation, is 70 to 75%. The left ventricle has hyperdynamic function. The left ventricle has no regional wall motion abnormalities. Left ventricular diastolic parameters are indeterminate. 2. Right ventricular systolic function is normal. The right ventricular size is normal. Tricuspid regurgitation signal is inadequate for assessing PA pressure. 3. Left atrial size was mildly dilated. 4. The mitral valve is abnormal. Trivial mitral valve regurgitation. No evidence of mitral stenosis. Severe mitral annular calcification. 5. The aortic valve is tricuspid. There is mild calcification of the aortic valve. There is mild thickening of the aortic valve. Aortic valve regurgitation is mild. Aortic valve sclerosis/calcification is present, without any evidence of aortic stenosis. 6. Aortic dilatation noted. There is moderate dilatation of the ascending aorta, measuring 46 mm. 7. The inferior vena cava is dilated in size with >50% respiratory variability, suggesting right atrial pressure of 8 mmHg.  Comparison(s): No significant change from prior study.  FINDINGS Left Ventricle: Left ventricular ejection fraction, by estimation, is 70 to 75%. The left ventricle has hyperdynamic function. The left ventricle has no regional wall motion abnormalities. The left ventricular internal cavity size was normal  in size. There is no left ventricular hypertrophy. Left ventricular diastolic parameters are indeterminate.  Right Ventricle: The right ventricular size is normal. No increase in right ventricular wall thickness. Right ventricular systolic function is normal. Tricuspid regurgitation signal is inadequate for assessing PA pressure.  Left Atrium: Left atrial size was mildly dilated.  Right Atrium: Right atrial size was normal in size.  Pericardium: There is no evidence of pericardial effusion.  Mitral Valve: The mitral valve is abnormal. There is mild thickening of the mitral valve leaflet(s). There is mild calcification of the mitral valve leaflet(s). Severe mitral annular calcification. Trivial mitral valve regurgitation. No evidence of mitral valve stenosis.  Tricuspid Valve: The tricuspid valve is normal in structure. Tricuspid valve regurgitation is trivial.  Aortic Valve: The aortic valve is tricuspid. There is mild calcification of the aortic valve. There is mild thickening of the aortic valve. Aortic valve regurgitation is mild. Aortic valve sclerosis/calcification is present, without any evidence of aortic stenosis.  Pulmonic Valve: The  pulmonic valve was not well visualized. Pulmonic valve regurgitation is trivial.  Aorta: Aortic dilatation noted. There is moderate dilatation of the ascending aorta, measuring 46 mm.  Venous: The inferior vena cava is dilated in size with greater than 50% respiratory variability, suggesting right atrial pressure of 8 mmHg.  IAS/Shunts: The atrial septum is grossly normal.   LEFT VENTRICLE PLAX 2D LVIDd:         4.20 cm LVIDs:         2.60 cm LV PW:         1.20 cm LV IVS:        0.87 cm LVOT diam:     2.20 cm LV SV:         76 LV SV Index:   34 LVOT Area:     3.80 cm  LV Volumes (MOD) LV vol d, MOD A2C: 54.2 ml LV vol d, MOD A4C: 54.4 ml LV vol s, MOD A2C: 15.8 ml LV vol s, MOD A4C: 12.3 ml LV SV MOD A2C:     38.4 ml LV SV MOD A4C:      54.4 ml LV SV MOD BP:      41.5 ml  RIGHT VENTRICLE             IVC RV S prime:     14.30 cm/s  IVC diam: 2.60 cm TAPSE (M-mode): 1.7 cm  LEFT ATRIUM             Index        RIGHT ATRIUM           Index LA diam:        3.30 cm 1.48 cm/m   RA Area:     13.00 cm LA Vol (A2C):   79.1 ml 35.41 ml/m  RA Volume:   26.20 ml  11.73 ml/m LA Vol (A4C):   65.0 ml 29.10 ml/m LA Biplane Vol: 74.3 ml 33.26 ml/m AORTIC VALVE             PULMONIC VALVE LVOT Vmax:   125.50 cm/s PR End Diast Vel: 1.30 msec LVOT Vmean:  77.350 cm/s LVOT VTI:    0.200 m  AORTA Ao Root diam: 3.20 cm Ao Asc diam:  4.55 cm  MITRAL VALVE MV Area (PHT): 2.63 cm     SHUNTS MV Decel Time: 289 msec     Systemic VTI:  0.20 m MV E velocity: 106.00 cm/s  Systemic Diam: 2.20 cm  Laurance Flatten MD Electronically signed by Laurance Flatten MD Signature Date/Time: 05/01/2022/1:55:13 PM    Final   TEE  ECHO TEE 08/13/2022  Narrative TRANSESOPHOGEAL ECHO REPORT    Patient Name:   Natasha Glass Date of Exam: 08/13/2022 Medical Rec #:  [id number]      Height:       63.0 in Accession #:    [id number]     Weight:       260.0 lb Date of Birth:  1941/04/09     BSA:          2.162 m Patient Age:    80 years       BP:           123/69 mmHg Patient Gender: F              HR:           88 bpm. Exam Location:  Inpatient  Procedure: Transesophageal Echo, Cardiac Doppler and Color Doppler  Indications:  Atrial fibrillation I48.91  History:         Patient has prior history of Echocardiogram examinations, most recent 05/01/2022. Arrythmias:Atrial Fibrillation, Signs/Symptoms:Chest Pain; Risk Factors:Hypertension, Diabetes and Dyslipidemia.  Sonographer:     Irving Burton Senior Sonographer#2:   Aron Baba Referring Phys:  [id number] TRACI R TURNER Diagnosing Phys: Armanda Magic MD  PROCEDURE: After discussion of the risks and benefits of a TEE, an informed consent was obtained from the patient. The transesophogeal  probe was passed without difficulty through the esophogus of the patient. Imaged were obtained with the patient in a left lateral decubitus position. Sedation performed by different physician. The patient's vital signs; including heart rate, blood pressure, and oxygen saturation; remained stable throughout the procedure. The patient developed no complications during the procedure.  IMPRESSIONS   1. Left ventricular ejection fraction, by estimation, is 60 to 65%. The left ventricle has normal function. The left ventricle has no regional wall motion abnormalities. 2. Right ventricular systolic function is normal. The right ventricular size is normal. 3. Left atrial size was moderately dilated. No left atrial/left atrial appendage thrombus was detected. The LAA emptying velocity was 43 cm/s. 4. The mitral valve is normal in structure. Mild mitral valve regurgitation. No evidence of mitral stenosis. 5. The aortic valve is tricuspid. Aortic valve regurgitation is mild. Aortic valve sclerosis/calcification is present, without any evidence of aortic stenosis. 6. There is mild (Grade II) layered plaque involving the ascending aorta. 7. The inferior vena cava is normal in size with greater than 50% respiratory variability, suggesting right atrial pressure of 3 mmHg.  Conclusion(s)/Recommendation(s): Normal biventricular function without evidence of hemodynamically significant valvular heart disease. That patient converted spontaneously to NSR prior to attempt at cardioversion.  FINDINGS Left Ventricle: Left ventricular ejection fraction, by estimation, is 60 to 65%. The left ventricle has normal function. The left ventricle has no regional wall motion abnormalities. The left ventricular internal cavity size was normal in size. There is no left ventricular hypertrophy.  Right Ventricle: The right ventricular size is normal. No increase in right ventricular wall thickness. Right ventricular systolic  function is normal.  Left Atrium: Left atrial size was moderately dilated. Spontaneous echo contrast was present in the left atrium. No left atrial/left atrial appendage thrombus was detected. The LAA emptying velocity was 43 cm/s.  Right Atrium: Right atrial size was normal in size.  Pericardium: There is no evidence of pericardial effusion.  Mitral Valve: The mitral valve is normal in structure. Mild mitral valve regurgitation. No evidence of mitral valve stenosis.  Tricuspid Valve: The tricuspid valve is normal in structure. Tricuspid valve regurgitation is mild . No evidence of tricuspid stenosis.  Aortic Valve: The aortic valve is tricuspid. Aortic valve regurgitation is mild. Aortic valve sclerosis/calcification is present, without any evidence of aortic stenosis.  Pulmonic Valve: The pulmonic valve was normal in structure. Pulmonic valve regurgitation is mild. No evidence of pulmonic stenosis.  Aorta: The aortic root is normal in size and structure. There is mild (Grade II) layered plaque involving the ascending aorta.  Venous: The left upper pulmonary vein is normal. The inferior vena cava is normal in size with greater than 50% respiratory variability, suggesting right atrial pressure of 3 mmHg.  IAS/Shunts: There is redundancy of the interatrial septum. No atrial level shunt detected by color flow Doppler.    AORTA Ao Root diam: 3.50 cm Ao Asc diam:  4.00 cm  Armanda Magic MD Electronically signed by Armanda Magic MD Signature Date/Time: 08/13/2022/10:53:07 AM  Final        ______________________________________________________________________________________________     Recent Labs: 05/21/2023: ALT 8; B Natriuretic Peptide 106.8 05/23/2023: Hemoglobin 11.1; Platelets 148 09/03/2023: BUN 18; Creatinine, Ser 0.70; Magnesium 1.9; Potassium 4.4; Sodium 140  Recent Lipid Panel    Component Value Date/Time   CHOL  02/07/2009 0418    136        ATP III  CLASSIFICATION:  <200     mg/dL   Desirable  161-096  mg/dL   Borderline High  >=045    mg/dL   High          TRIG 84 02/07/2009 0418   HDL 45 02/07/2009 0418   CHOLHDL 3.0 02/07/2009 0418   VLDL 17 02/07/2009 0418   LDLCALC  02/07/2009 0418    74        Total Cholesterol/HDL:CHD Risk Coronary Heart Disease Risk Table                     Men   Women  1/2 Average Risk   3.4   3.3  Average Risk       5.0   4.4  2 X Average Risk   9.6   7.1  3 X Average Risk  23.4   11.0        Use the calculated Patient Ratio above and the CHD Risk Table to determine the patient's CHD Risk.        ATP III CLASSIFICATION (LDL):  <100     mg/dL   Optimal  409-811  mg/dL   Near or Above                    Optimal  130-159  mg/dL   Borderline  914-782  mg/dL   High  >956     mg/dL   Very High    History of Present Illness    83 year old female with the above past medical history including paroxysmal atrial fibrillation, chronic diastolic heart failure, hypertension, ascending aortic aneurysm, type 2 diabetes, OSA, DVT/PE, GERD, and gout.   She has followed primarily in the A-fib clinic.  Echo in 2018 was normal.  She was initially diagnosed with atrial fibrillation in 2020 at a follow-up visit with her PCP-she reported a history of not feeling well at the time. She was started on Xarelto, however, this was later switched to Coumadin. She underwent successful DCCV in 05/2019.  She had recurrent atrial fibrillation in October 2020.  She was loaded with Tikosyn in 08/2019.  She developed recurrent atrial fibrillation with RVR during hospitalization in the fall 2022 in the setting of sepsis due to right leg cellulitis. She also has a history of ascending aortic aneurysm, monitored by Dr. [name] with CT surgery.  She was hospitalized in July 2023 in the setting of COVID-19 fall, and A-fib with RVR.  Repeat echocardiogram at the time showed EF 70 to 75%, no RWMA, indeterminate diastolic parameters.  Metoprolol  was increased.   At her follow-up visit in 07/2022 she was noted to have recurrent atrial fibrillation with RVR.  She was referred for TEE/DCCV (pt is on coumadin), however, procedure was canceled as patient spontaneously converted to NSR with PACs.  She was transitioned from warfarin to Eliquis.  She was last seen in the A-fib clinic on 09/03/2023 and was stable from a cardiac standpoint.  She was maintaining sinus rhythm.   She presents today for follow-up. Since her last visit she has been  stable overall from a cardiac standpoint.  She has stable nonpitting bilateral lower extremity edema, chronic, she notes some mild shortness of breath, she denies any chest pain,palpitations, dizziness, PND, orthopnea, weight gain.  EKG today shows atrial fibrillation with RVR.  She notes that she was told her HR was elevated at her PCPs office last week, otherwise, she has not been monitoring her heart rate at home and was unaware that she was in atrial fibrillation.   Home Medications    Current Outpatient Medications  Medication Sig Dispense Refill   acetaminophen (TYLENOL) 500 MG tablet Take 500-1,000 mg by mouth at bedtime.     albuterol (VENTOLIN HFA) 108 (90 Base) MCG/ACT inhaler Inhale 2 puffs into the lungs every 6 (six) hours as needed for wheezing or shortness of breath. 6.7 g 0   allopurinol (ZYLOPRIM) 300 MG tablet Take 0.5 tablets (150 mg total) by mouth every evening. 15 tablet 0   Cholecalciferol (VITAMIN D3) 25 MCG (1000 UT) CAPS Take 2,000 Units by mouth in the morning.     clobetasol cream (TEMOVATE) 0.05 % as needed (rash).     dofetilide (TIKOSYN) 250 MCG capsule Take 1 capsule (250 mcg total) by mouth 2 (two) times daily. 180 capsule 1   ELIQUIS 5 MG TABS tablet Take 1 tablet (5 mg total) by mouth 2 (two) times daily. 180 tablet 1   furosemide (LASIX) 40 MG tablet 1 tablet by mouth once daily, may take extra 1.5 tablets as needed for swelling 90 tablet 3   Hyprom-Naphaz-Polysorb-Zn Sulf  (CLEAR EYES COMPLETE) SOLN as needed (itching).     losartan (COZAAR) 25 MG tablet Take 1 tablet (25 mg total) by mouth daily. 30 tablet 0   magnesium oxide (MAG-OX) 400 MG tablet Take 1 tablet (400 mg total) by mouth at bedtime. (Patient taking differently: Take 250 mg by mouth at bedtime.) 30 tablet 0   metFORMIN (GLUCOPHAGE) 500 MG tablet Take 1 tablet (500 mg total) by mouth 2 (two) times daily with a meal. 60 tablet 0   metoprolol succinate (TOPROL-XL) 25 MG 24 hr tablet Take 0.5 tablets (12.5 mg total) by mouth at bedtime. 45 tablet 1   Multiple Vitamins-Minerals (PRESERVISION AREDS 2) CAPS Take 2 capsules by mouth daily with lunch.     potassium chloride SA (KLOR-CON M) 20 MEQ tablet Take 1 tablet (20 mEq total) by mouth in the morning.     rosuvastatin (CRESTOR) 5 MG tablet Take 5 mg by mouth in the morning.     vitamin B-12 (CYANOCOBALAMIN) 500 MCG tablet Take 500 mcg by mouth in the morning.     Dextromethorphan-guaiFENesin 10-100 MG/5ML liquid Take 10 mLs by mouth every 4 (four) hours as needed for cough (Patient taking differently: Take 10 mLs by mouth as needed.) 237 mL 0   No current facility-administered medications for this visit.     Review of Systems    She denies chest pain, palpitations, pnd, orthopnea, n, v, dizziness, syncope, weight gain, or early satiety. All other systems reviewed and are otherwise negative except as noted above.   Physical Exam    VS:  BP 120/70   Pulse (!) 130   Ht 5\' 3"  (1.6 m)   Wt 265 lb (120.2 kg)   SpO2 95%   BMI 46.94 kg/m   GEN: Well nourished, well developed, in no acute distress. HEENT: normal. Neck: Supple, no JVD, carotid bruits, or masses. Cardiac: IRIR, no murmurs, rubs, or gallops. No clubbing, cyanosis, 1+ bilateral  lower extremity edema.  Radials/DP/PT 2+ and equal bilaterally.  Respiratory:  Respirations regular and unlabored, clear to auscultation bilaterally. GI: Soft, nontender, nondistended, BS + x 4. MS: no deformity  or atrophy. Skin: warm and dry, no rash.  Chronic skin changes to bilateral lower extremities. Neuro:  Strength and sensation are intact. Psych: Normal affect.  Accessory Clinical Findings    ECG personally reviewed by me today - EKG Interpretation Date/Time:  Monday December 08 2023 11:26:27 EST Ventricular Rate:  130 PR Interval:    QRS Duration:  84 QT Interval:  328 QTC Calculation: 482 R Axis:   -53  Text Interpretation: Atrial fibrillation with rapid ventricular response Low voltage QRS Left anterior fascicular block Cannot rule out Anterior infarct , age undetermined When compared with ECG of 03-Sep-2023 10:38, Atrial fibrillation has replaced Sinus rhythm Vent. rate has increased BY  68 BPM Left anterior fascicular block is now Present Confirmed by Bernadene Person ([id number]) on 12/08/2023 11:29:08 AM  - no acute changes.   Lab Results  Component Value Date   WBC 5.9 05/23/2023   HGB 11.1 (L) 05/23/2023   HCT 36.8 05/23/2023   MCV 90.9 05/23/2023   PLT 148 (L) 05/23/2023   Lab Results  Component Value Date   CREATININE 0.70 09/03/2023   BUN 18 09/03/2023   NA 140 09/03/2023   K 4.4 09/03/2023   CL 104 09/03/2023   CO2 26 09/03/2023   Lab Results  Component Value Date   ALT 8 05/21/2023   AST 17 05/21/2023   ALKPHOS 49 05/21/2023   BILITOT 0.3 05/21/2023   Lab Results  Component Value Date   CHOL  02/07/2009    136        ATP III CLASSIFICATION:  <200     mg/dL   Desirable  604-540  mg/dL   Borderline High  >=981    mg/dL   High          HDL 45 02/07/2009   LDLCALC  02/07/2009    74        Total Cholesterol/HDL:CHD Risk Coronary Heart Disease Risk Table                     Men   Women  1/2 Average Risk   3.4   3.3  Average Risk       5.0   4.4  2 X Average Risk   9.6   7.1  3 X Average Risk  23.4   11.0        Use the calculated Patient Ratio above and the CHD Risk Table to determine the patient's CHD Risk.        ATP III CLASSIFICATION (LDL):  <100      mg/dL   Optimal  191-478  mg/dL   Near or Above                    Optimal  130-159  mg/dL   Borderline  295-621  mg/dL   High  >308     mg/dL   Very High   TRIG 84 65/78/4696   CHOLHDL 3.0 02/07/2009    Lab Results  Component Value Date   HGBA1C 6.5 (H) 05/22/2023    Assessment & Plan   1. Paroxysmal atrial fibrillation: EKG today shows atrial fibrillation with RVR.  She notes some mild shortness of breath, though she was overall unaware that she was in atrial fibrillation.  Will check CBC, BMET,  TSH, and magnesium today.  Will increase metoprolol to 25 mg daily. She will continue to monitor her heart rate at home.  BP stable in office today. Reviewed ED precautions.  Will plan for close follow-up with A-fib clinic.  If she remains in A-fib consider need for DCCV.  Encouraged adherence to Eliquis (she notes today that she has not missed any doses). Continue Tikosyn, metoprolol, Eliquis.   2. Chronic diastolic heart failure: Echo in 04/2022 showed EF 70 to 75%, no RWMA, indeterminate diastolic parameters.  She does have stable chronic bilateral pedal/lower extremity edema. She is following with wound care for a chronic right lower extremity wound. Otherwise, generally euvolemic, well compensated on exam. Continue metoprolol, lasix.    3. Hypertension: BP well controlled.  Continue to monitor with increase metoprolol as above, otherwise, continue current antihypertensive regimen.   4. Ascending aortic aneurysm: She has followed with Dr. [name].  Stable measuring 4.5 cm on CT in 09/2023.  She is not a surgical candidate, nor is she interested in surgical repair. Therefore, she has decided to no longer pursue serial imaging.    5. Type 2 diabetes: A1C was 6.5 in 05/2023.  Monitored and managed per PCP.   6. OSA: She has not used her CPAP consistently since her COVID infection in July 2023. Encouraged adherence to CPAP.    7. Disposition: Follow-up ASAP with A-fib clinic, follow-up in 6  months with Dr. Jens Som.       Joylene Grapes, NP 12/08/2023, 11:31 AM

## 2023-12-08 NOTE — Addendum Note (Signed)
 Addended by: Bernadene Person C on: 12/08/2023 12:20 PM   Modules accepted: Orders

## 2023-12-08 NOTE — Patient Instructions (Signed)
 Medication Instructions:  Increase Metoprolol Succinate 25 mg daily  *If you need a refill on your cardiac medications before your next appointment, please call your pharmacy*   Lab Work: CBC, BMET, TSH   Testing/Procedures: NONE ordered at this time of appointment    Follow-Up: At [facility], you and your health needs are our priority.  As part of our continuing mission to provide you with exceptional heart care, we have created designated Provider Care Teams.  These Care Teams include your primary Cardiologist (physician) and Advanced Practice Providers (APPs -  Physician Assistants and Nurse Practitioners) who all work together to provide you with the care you need, when you need it.  We recommend signing up for the patient portal called "MyChart".  Sign up information is provided on this After Visit Summary.  MyChart is used to connect with patients for Virtual Visits (Telemedicine).  Patients are able to view lab/test results, encounter notes, upcoming appointments, etc.  Non-urgent messages can be sent to your provider as well.   To learn more about what you can do with MyChart, go to [email or web address].    Your next appointment:   6 month(s) Afib APT (ASAP) Provider:   [name], MD     Other Instructions

## 2023-12-08 NOTE — Progress Notes (Signed)
 Patient presents today to pick up diabetic shoes and insoles.  Patient was dispensed 1 pair of diabetic shoes and 3 pairs of diabetic insoles. Fit was satisfactory. Instructions for break-in and wear was reviewed and a copy was given to the patient.   Re-appointment for regularly scheduled diabetic foot care visits or if they should experience any trouble with the shoes or insoles.

## 2023-12-09 LAB — BASIC METABOLIC PANEL
BUN/Creatinine Ratio: 22 (ref 12–28)
BUN: 16 mg/dL (ref 8–27)
CO2: 24 mmol/L (ref 20–29)
Calcium: 10.1 mg/dL (ref 8.7–10.3)
Chloride: 103 mmol/L (ref 96–106)
Creatinine, Ser: 0.74 mg/dL (ref 0.57–1.00)
Glucose: 121 mg/dL — ABNORMAL HIGH (ref 70–99)
Potassium: 4.8 mmol/L (ref 3.5–5.2)
Sodium: 145 mmol/L — ABNORMAL HIGH (ref 134–144)
eGFR: 81 mL/min/{1.73_m2} (ref >59)

## 2023-12-09 LAB — CBC
Hematocrit: 40.6 % (ref 34.0–46.6)
Hemoglobin: 12.5 g/dL (ref 11.1–15.9)
MCH: 28.1 pg (ref 26.6–33.0)
MCHC: 30.8 g/dL — ABNORMAL LOW (ref 31.5–35.7)
MCV: 91 fL (ref 79–97)
Platelets: 170 10*3/uL (ref 150–450)
RBC: 4.45 x10E6/uL (ref 3.77–5.28)
RDW: 15.3 % (ref 11.7–15.4)
WBC: 6.5 10*3/uL (ref 3.4–10.8)

## 2023-12-09 LAB — TSH: TSH: 1.78 u[IU]/mL (ref 0.450–4.500)

## 2023-12-10 ENCOUNTER — Telehealth: Payer: Self-pay

## 2023-12-10 ENCOUNTER — Encounter (HOSPITAL_BASED_OUTPATIENT_CLINIC_OR_DEPARTMENT_OTHER): Payer: Medicare Other | Attending: Internal Medicine | Admitting: Internal Medicine

## 2023-12-10 DIAGNOSIS — L97812 Non-pressure chronic ulcer of other part of right lower leg with fat layer exposed: Secondary | ICD-10-CM | POA: Diagnosis not present

## 2023-12-10 DIAGNOSIS — I890 Lymphedema, not elsewhere classified: Secondary | ICD-10-CM | POA: Insufficient documentation

## 2023-12-10 DIAGNOSIS — Z7901 Long term (current) use of anticoagulants: Secondary | ICD-10-CM | POA: Insufficient documentation

## 2023-12-10 DIAGNOSIS — I87311 Chronic venous hypertension (idiopathic) with ulcer of right lower extremity: Secondary | ICD-10-CM | POA: Diagnosis not present

## 2023-12-10 DIAGNOSIS — I480 Paroxysmal atrial fibrillation: Secondary | ICD-10-CM | POA: Diagnosis not present

## 2023-12-10 DIAGNOSIS — E11622 Type 2 diabetes mellitus with other skin ulcer: Secondary | ICD-10-CM | POA: Diagnosis not present

## 2023-12-10 NOTE — Telephone Encounter (Signed)
Spoke with pt. Pt was notified of lab results. Pt will continue current medication and f/u as planned.  

## 2023-12-16 ENCOUNTER — Ambulatory Visit (HOSPITAL_COMMUNITY)
Admission: RE | Admit: 2023-12-16 | Discharge: 2023-12-16 | Disposition: A | Discharge status: Home / Self Care | Payer: Medicare Other | Source: Ambulatory Visit | Attending: Internal Medicine | Admitting: Internal Medicine

## 2023-12-16 VITALS — BP 116/64 | HR 101 | Ht 63.0 in | Wt 260.2 lb

## 2023-12-16 DIAGNOSIS — Z86718 Personal history of other venous thrombosis and embolism: Secondary | ICD-10-CM | POA: Diagnosis not present

## 2023-12-16 DIAGNOSIS — Z7901 Long term (current) use of anticoagulants: Secondary | ICD-10-CM | POA: Diagnosis not present

## 2023-12-16 DIAGNOSIS — I351 Nonrheumatic aortic (valve) insufficiency: Secondary | ICD-10-CM | POA: Insufficient documentation

## 2023-12-16 DIAGNOSIS — Z79899 Other long term (current) drug therapy: Secondary | ICD-10-CM | POA: Diagnosis not present

## 2023-12-16 DIAGNOSIS — Z5181 Encounter for therapeutic drug level monitoring: Secondary | ICD-10-CM | POA: Diagnosis not present

## 2023-12-16 DIAGNOSIS — I110 Hypertensive heart disease with heart failure: Secondary | ICD-10-CM | POA: Insufficient documentation

## 2023-12-16 DIAGNOSIS — I509 Heart failure, unspecified: Secondary | ICD-10-CM | POA: Diagnosis not present

## 2023-12-16 DIAGNOSIS — G4733 Obstructive sleep apnea (adult) (pediatric): Secondary | ICD-10-CM | POA: Diagnosis not present

## 2023-12-16 DIAGNOSIS — D6869 Other thrombophilia: Secondary | ICD-10-CM | POA: Diagnosis not present

## 2023-12-16 DIAGNOSIS — I4819 Other persistent atrial fibrillation: Secondary | ICD-10-CM | POA: Diagnosis not present

## 2023-12-16 DIAGNOSIS — Z7984 Long term (current) use of oral hypoglycemic drugs: Secondary | ICD-10-CM | POA: Diagnosis not present

## 2023-12-16 DIAGNOSIS — E119 Type 2 diabetes mellitus without complications: Secondary | ICD-10-CM | POA: Diagnosis not present

## 2023-12-16 DIAGNOSIS — E669 Obesity, unspecified: Secondary | ICD-10-CM | POA: Insufficient documentation

## 2023-12-16 DIAGNOSIS — I7121 Aneurysm of the ascending aorta, without rupture: Secondary | ICD-10-CM | POA: Diagnosis not present

## 2023-12-16 DIAGNOSIS — K219 Gastro-esophageal reflux disease without esophagitis: Secondary | ICD-10-CM | POA: Diagnosis not present

## 2023-12-16 LAB — MAGNESIUM: Magnesium: 2.1 mg/dL (ref 1.7–2.4)
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
## 2023-12-16 NOTE — Patient Instructions (Signed)
 Cardioversion scheduled for: March. 7th 2025   - Arrive at the [facility] and go to admitting at 10:30 am   - Do not eat or drink anything after midnight the night prior to your procedure.   - Take all your morning medication (except diabetic medications) with a sip of water prior to arrival.  - You will not be able to drive home after your procedure.    - Do NOT miss any doses of your blood thinner - if you should miss a dose please notify our office immediately.   - If you feel as if you go back into normal rhythm prior to scheduled cardioversion, please notify our office immediately.   If your procedure is canceled in the cardioversion suite you will be charged a cancellation fee.           For those patients who have a scheduled procedure/anesthesia on the same day of the week as their dose, hold the medication on the day of surgery.  They can take their scheduled dose the week before.  **Patients on the above medications scheduled for elective procedures that have not held the medication for the appropriate amount of time are at risk of cancellation or change in the anesthetic plan.

## 2023-12-17 ENCOUNTER — Encounter (INDEPENDENT_AMBULATORY_CARE_PROVIDER_SITE_OTHER): Payer: Medicare Other | Admitting: Ophthalmology

## 2023-12-17 DIAGNOSIS — H43813 Vitreous degeneration, bilateral: Secondary | ICD-10-CM | POA: Diagnosis not present

## 2023-12-17 DIAGNOSIS — H35033 Hypertensive retinopathy, bilateral: Secondary | ICD-10-CM | POA: Diagnosis not present

## 2023-12-17 DIAGNOSIS — H353221 Exudative age-related macular degeneration, left eye, with active choroidal neovascularization: Secondary | ICD-10-CM | POA: Diagnosis not present

## 2023-12-17 DIAGNOSIS — H35372 Puckering of macula, left eye: Secondary | ICD-10-CM

## 2023-12-17 DIAGNOSIS — H353112 Nonexudative age-related macular degeneration, right eye, intermediate dry stage: Secondary | ICD-10-CM

## 2023-12-17 DIAGNOSIS — I10 Essential (primary) hypertension: Secondary | ICD-10-CM | POA: Diagnosis not present

## 2023-12-18 ENCOUNTER — Encounter (HOSPITAL_BASED_OUTPATIENT_CLINIC_OR_DEPARTMENT_OTHER): Payer: Medicare Other | Attending: Internal Medicine | Admitting: Internal Medicine

## 2023-12-18 DIAGNOSIS — L97812 Non-pressure chronic ulcer of other part of right lower leg with fat layer exposed: Secondary | ICD-10-CM | POA: Diagnosis not present

## 2023-12-18 DIAGNOSIS — I480 Paroxysmal atrial fibrillation: Secondary | ICD-10-CM | POA: Insufficient documentation

## 2023-12-18 DIAGNOSIS — I890 Lymphedema, not elsewhere classified: Secondary | ICD-10-CM | POA: Diagnosis not present

## 2023-12-18 DIAGNOSIS — I87311 Chronic venous hypertension (idiopathic) with ulcer of right lower extremity: Secondary | ICD-10-CM | POA: Diagnosis not present

## 2023-12-18 DIAGNOSIS — Z7901 Long term (current) use of anticoagulants: Secondary | ICD-10-CM | POA: Diagnosis not present

## 2023-12-18 DIAGNOSIS — E11622 Type 2 diabetes mellitus with other skin ulcer: Secondary | ICD-10-CM | POA: Diagnosis not present

## 2023-12-18 NOTE — Progress Notes (Signed)
 Spoke to pt and instructed them to come at 1030 and to be NPO after 0000.  Confirmed no missed doses of AC and instructed to take in AM with a small sip of water.  Confirmed that pt will have a ride home and someone to stay with them for 24 hours after the procedure. Instructed patient to not wear any jewelry or lotion.

## 2023-12-19 ENCOUNTER — Encounter (HOSPITAL_COMMUNITY): Payer: Self-pay | Admitting: Cardiology

## 2023-12-19 ENCOUNTER — Encounter (HOSPITAL_COMMUNITY)
Admission: RE | Disposition: A | Discharge status: Home / Self Care | Payer: Self-pay | Source: Ambulatory Visit | Attending: Cardiology

## 2023-12-19 ENCOUNTER — Other Ambulatory Visit: Payer: Self-pay

## 2023-12-19 ENCOUNTER — Ambulatory Visit (HOSPITAL_COMMUNITY): Admitting: Anesthesiology

## 2023-12-19 ENCOUNTER — Ambulatory Visit (HOSPITAL_BASED_OUTPATIENT_CLINIC_OR_DEPARTMENT_OTHER)
Admission: RE | Admit: 2023-12-19 | Discharge: 2023-12-19 | Disposition: A | Discharge status: Home / Self Care | Source: Ambulatory Visit | Attending: Cardiology | Admitting: Cardiology

## 2023-12-19 DIAGNOSIS — I7121 Aneurysm of the ascending aorta, without rupture: Secondary | ICD-10-CM | POA: Diagnosis not present

## 2023-12-19 DIAGNOSIS — E119 Type 2 diabetes mellitus without complications: Secondary | ICD-10-CM | POA: Diagnosis not present

## 2023-12-19 DIAGNOSIS — M8580 Other specified disorders of bone density and structure, unspecified site: Secondary | ICD-10-CM | POA: Diagnosis not present

## 2023-12-19 DIAGNOSIS — J9601 Acute respiratory failure with hypoxia: Secondary | ICD-10-CM | POA: Diagnosis not present

## 2023-12-19 DIAGNOSIS — Z86718 Personal history of other venous thrombosis and embolism: Secondary | ICD-10-CM | POA: Insufficient documentation

## 2023-12-19 DIAGNOSIS — I4891 Unspecified atrial fibrillation: Secondary | ICD-10-CM | POA: Diagnosis not present

## 2023-12-19 DIAGNOSIS — I878 Other specified disorders of veins: Secondary | ICD-10-CM | POA: Diagnosis not present

## 2023-12-19 DIAGNOSIS — S2242XB Multiple fractures of ribs, left side, initial encounter for open fracture: Secondary | ICD-10-CM | POA: Diagnosis not present

## 2023-12-19 DIAGNOSIS — K5900 Constipation, unspecified: Secondary | ICD-10-CM | POA: Diagnosis not present

## 2023-12-19 DIAGNOSIS — D6869 Other thrombophilia: Secondary | ICD-10-CM | POA: Insufficient documentation

## 2023-12-19 DIAGNOSIS — H353130 Nonexudative age-related macular degeneration, bilateral, stage unspecified: Secondary | ICD-10-CM | POA: Diagnosis not present

## 2023-12-19 DIAGNOSIS — I110 Hypertensive heart disease with heart failure: Secondary | ICD-10-CM

## 2023-12-19 DIAGNOSIS — I5032 Chronic diastolic (congestive) heart failure: Secondary | ICD-10-CM

## 2023-12-19 DIAGNOSIS — S2242XA Multiple fractures of ribs, left side, initial encounter for closed fracture: Secondary | ICD-10-CM | POA: Diagnosis not present

## 2023-12-19 DIAGNOSIS — Z7901 Long term (current) use of anticoagulants: Secondary | ICD-10-CM | POA: Insufficient documentation

## 2023-12-19 DIAGNOSIS — T501X6A Underdosing of loop [high-ceiling] diuretics, initial encounter: Secondary | ICD-10-CM | POA: Diagnosis not present

## 2023-12-19 DIAGNOSIS — D649 Anemia, unspecified: Secondary | ICD-10-CM | POA: Insufficient documentation

## 2023-12-19 DIAGNOSIS — I517 Cardiomegaly: Secondary | ICD-10-CM | POA: Diagnosis not present

## 2023-12-19 DIAGNOSIS — G4733 Obstructive sleep apnea (adult) (pediatric): Secondary | ICD-10-CM

## 2023-12-19 DIAGNOSIS — Z79899 Other long term (current) drug therapy: Secondary | ICD-10-CM | POA: Insufficient documentation

## 2023-12-19 DIAGNOSIS — I10 Essential (primary) hypertension: Secondary | ICD-10-CM | POA: Diagnosis not present

## 2023-12-19 DIAGNOSIS — I2510 Atherosclerotic heart disease of native coronary artery without angina pectoris: Secondary | ICD-10-CM | POA: Diagnosis not present

## 2023-12-19 DIAGNOSIS — L97219 Non-pressure chronic ulcer of right calf with unspecified severity: Secondary | ICD-10-CM | POA: Diagnosis not present

## 2023-12-19 DIAGNOSIS — Z7984 Long term (current) use of oral hypoglycemic drugs: Secondary | ICD-10-CM | POA: Diagnosis not present

## 2023-12-19 DIAGNOSIS — D3501 Benign neoplasm of right adrenal gland: Secondary | ICD-10-CM | POA: Diagnosis not present

## 2023-12-19 DIAGNOSIS — E739 Lactose intolerance, unspecified: Secondary | ICD-10-CM | POA: Diagnosis not present

## 2023-12-19 DIAGNOSIS — S2241XA Multiple fractures of ribs, right side, initial encounter for closed fracture: Secondary | ICD-10-CM | POA: Diagnosis not present

## 2023-12-19 DIAGNOSIS — I4819 Other persistent atrial fibrillation: Secondary | ICD-10-CM

## 2023-12-19 DIAGNOSIS — Z96653 Presence of artificial knee joint, bilateral: Secondary | ICD-10-CM | POA: Diagnosis not present

## 2023-12-19 DIAGNOSIS — K219 Gastro-esophageal reflux disease without esophagitis: Secondary | ICD-10-CM | POA: Insufficient documentation

## 2023-12-19 DIAGNOSIS — E1169 Type 2 diabetes mellitus with other specified complication: Secondary | ICD-10-CM | POA: Diagnosis not present

## 2023-12-19 DIAGNOSIS — S2243XA Multiple fractures of ribs, bilateral, initial encounter for closed fracture: Secondary | ICD-10-CM | POA: Diagnosis not present

## 2023-12-19 DIAGNOSIS — I509 Heart failure, unspecified: Secondary | ICD-10-CM | POA: Insufficient documentation

## 2023-12-19 DIAGNOSIS — R079 Chest pain, unspecified: Secondary | ICD-10-CM | POA: Diagnosis not present

## 2023-12-19 DIAGNOSIS — S2249XA Multiple fractures of ribs, unspecified side, initial encounter for closed fracture: Secondary | ICD-10-CM | POA: Diagnosis not present

## 2023-12-19 DIAGNOSIS — Z96652 Presence of left artificial knee joint: Secondary | ICD-10-CM | POA: Diagnosis not present

## 2023-12-19 DIAGNOSIS — Z8616 Personal history of COVID-19: Secondary | ICD-10-CM | POA: Diagnosis not present

## 2023-12-19 DIAGNOSIS — E785 Hyperlipidemia, unspecified: Secondary | ICD-10-CM | POA: Diagnosis not present

## 2023-12-19 DIAGNOSIS — M96A3 Multiple fractures of ribs associated with chest compression and cardiopulmonary resuscitation: Secondary | ICD-10-CM | POA: Diagnosis not present

## 2023-12-19 HISTORY — PX: CARDIOVERSION: EP1203

## 2023-12-19 SURGERY — CARDIOVERSION (CATH LAB)
Anesthesia: General

## 2023-12-19 MED ORDER — SODIUM CHLORIDE 0.9% FLUSH: 3.0000 mL | Freq: Two times a day (BID) | INTRAVENOUS | Status: DC

## 2023-12-19 MED ORDER — ACETAMINOPHEN 325 MG PO TABS
ORAL_TABLET | ORAL | Status: AC
  Filled 2023-12-19: qty 2

## 2023-12-19 MED ORDER — ACETAMINOPHEN 325 MG PO TABS
650.0000 mg | ORAL_TABLET | ORAL | Status: AC
  Administered 2023-12-19: 650 mg via ORAL

## 2023-12-19 MED ORDER — PROPOFOL 10 MG/ML IV BOLUS
INTRAVENOUS | Status: DC | PRN
Start: 2023-12-19 — End: 2023-12-19
  Administered 2023-12-19: 40 mg via INTRAVENOUS
  Administered 2023-12-19: 60 mg via INTRAVENOUS

## 2023-12-19 MED ORDER — LIDOCAINE 2% (20 MG/ML) 5 ML SYRINGE
INTRAMUSCULAR | Status: DC | PRN
  Administered 2023-12-19: 50 mg via INTRAVENOUS

## 2023-12-19 MED ORDER — SODIUM CHLORIDE 0.9% FLUSH: 3.0000 mL | INTRAVENOUS | Status: DC | PRN

## 2023-12-19 SURGICAL SUPPLY — 1 items: PAD DEFIB RADIO PHYSIO CONN (PAD) ×1 IMPLANT

## 2023-12-19 NOTE — Transfer of Care (Signed)
 Immediate Anesthesia Transfer of Care Note  Patient: Natasha Glass  Procedure(s) Performed: CARDIOVERSION  Patient Location: Cath Lab  Anesthesia Type:MAC  Level of Consciousness: awake, alert , oriented, and patient cooperative  Airway & Oxygen Therapy: Patient Spontanous Breathing and Patient connected to face mask oxygen  Post-op Assessment: Report given to RN, Post -op Vital signs reviewed and stable, Patient moving all extremities, Patient moving all extremities X 4, and Patient able to stick tongue midline  Post vital signs: Reviewed and stable  Last Vitals:  Vitals Value Taken Time  BP    Temp    Pulse    Resp    SpO2      Last Pain:  Vitals:   12/19/23 1045  TempSrc:   PainSc: 0-No pain         Complications: No notable events documented.

## 2023-12-19 NOTE — Anesthesia Postprocedure Evaluation (Signed)
 Anesthesia Post Note  Patient: Natasha Glass  Procedure(s) Performed: CARDIOVERSION     Patient location during evaluation: PACU Anesthesia Type: General Level of consciousness: awake and alert Pain management: pain level controlled Vital Signs Assessment: post-procedure vital signs reviewed and stable Respiratory status: spontaneous breathing, nonlabored ventilation, respiratory function stable and patient connected to nasal cannula oxygen Cardiovascular status: blood pressure returned to baseline and stable Postop Assessment: no apparent nausea or vomiting Anesthetic complications: no   No notable events documented.  Last Vitals:  Vitals:   12/19/23 1126 12/19/23 1140  BP: (!) 134/93 (!) 132/91  Pulse: 83 84  Resp: (!) 24 20  Temp:    SpO2: 98% 100%    Last Pain:  Vitals:   12/19/23 1140  TempSrc:   PainSc: 4                  Simona Rocque S
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
## 2023-12-19 NOTE — Progress Notes (Signed)
 Per report, pt went asystole during cardioversion and cpr was initiated for approximately 4 compressions. Dr. Jens Som came to bedside once pt was lucid and spoke with her and her son explaining what occurred. Pt and son verbalized understanding and pt stated that she felt well enough to leave. She stated she had chest pain from the compressions and 650mg  of tylenol was given. Explained post procedure instructions and pt denied and questions.

## 2023-12-19 NOTE — CV Procedure (Signed)
 Electrical Cardioversion Procedure Note Natasha Glass [id number] Jun 16, 1941  Procedure: Electrical Cardioversion Indications:  Atrial Fibrillation  Procedure Details Consent: Risks of procedure as well as the alternatives and risks of each were explained to the (patient/caregiver).  Consent for procedure obtained. Time Out: Verified patient identification, verified procedure, site/side was marked, verified correct patient position, special equipment/implants available, medications/allergies/relevent history reviewed, required imaging and test results available.  Performed  Patient placed on cardiac monitor, pulse oximetry, supplemental oxygen as necessary.  Sedation given:  Pt sedated by anesthesia with lidocaine 50 mg and diprovan 100 mg IV.  Pacer pads placed anterior and posterior chest.  Cardioverted 3 time(s).  Cardioverted at 300J with transient sinus rhythm after a long pause.  Patient then noted to be in atrial tachycardia with 2:1 conduction.  Second attempt at 300 J with continued atrial tachycardia with 2:1 conduction.  3rd attempt with 360 J resulted in transient sinus with long pause/transient asystole.  Patient received approximately 3 to 5 seconds of CPR and then was noted to be in atrial tachycardia with palpable pulse and normal blood pressure.  No further attempts.  Following procedure patient was noted to be in atrial tachycardia with 2-1 AV conduction and heart rate in the 70s.  Normal blood pressure.  Evaluation Findings: Post procedure EKG shows: Atrial tachycardia with 2-1 conduction Complications: Transient bradycardia as outlined above.  I discussed the patient with Dr. Graciela Husbands.  He felt comfortable that patient could be discharged and I agree with this as she was stable following procedure.  She is scheduled to follow-up in atrial fibrillation clinic in 2 weeks.  Will continue with present medications.   Natasha Glass 12/19/2023, 10:30 AM

## 2023-12-19 NOTE — Anesthesia Preprocedure Evaluation (Signed)
 Anesthesia Evaluation  Patient identified by MRN, date of birth, ID band Patient awake    Reviewed: Allergy & Precautions, H&P , NPO status , Patient's Chart, lab work & pertinent test results  Airway Mallampati: II   Neck ROM: full    Dental   Pulmonary sleep apnea    breath sounds clear to auscultation       Cardiovascular hypertension, +CHF  + dysrhythmias Atrial Fibrillation  Rhythm:irregular Rate:Normal     Neuro/Psych  Headaches  Neuromuscular disease    GI/Hepatic PUD,GERD  ,,  Endo/Other  diabetes, Type 2  Class 3 obesity  Renal/GU      Musculoskeletal  (+) Arthritis ,    Abdominal   Peds  Hematology   Anesthesia Other Findings   Reproductive/Obstetrics                             Anesthesia Physical Anesthesia Plan  ASA: 3  Anesthesia Plan: General   Post-op Pain Management:    Induction: Intravenous  PONV Risk Score and Plan: 3 and Propofol infusion and Treatment may vary due to age or medical condition  Airway Management Planned: Nasal Cannula  Additional Equipment:   Intra-op Plan:   Post-operative Plan:   Informed Consent: I have reviewed the patients History and Physical, chart, labs and discussed the procedure including the risks, benefits and alternatives for the proposed anesthesia with the patient or authorized representative who has indicated his/her understanding and acceptance.     Dental advisory given  Plan Discussed with: CRNA, Anesthesiologist and Surgeon  Anesthesia Plan Comments:        Anesthesia Quick Evaluation

## 2023-12-19 NOTE — Interval H&P Note (Signed)
 History and Physical Interval Note:  12/19/2023 10:32 AM  Natasha Glass  has presented today for surgery, with the diagnosis of afib.  The various methods of treatment have been discussed with the patient and family. After consideration of risks, benefits and other options for treatment, the patient has consented to  Procedure(s): CARDIOVERSION (N/A) as a surgical intervention.  The patient's history has been reviewed, patient examined, no change in status, stable for surgery.  I have reviewed the patient's chart and labs.  Questions were answered to the patient's satisfaction.     [name]

## 2023-12-21 ENCOUNTER — Inpatient Hospital Stay (HOSPITAL_COMMUNITY)
Admission: EM | Admit: 2023-12-21 | Discharge: 2023-12-29 | DRG: 564 | Disposition: A | Discharge status: Skilled Nursing Facility | Attending: Internal Medicine | Admitting: Internal Medicine

## 2023-12-21 ENCOUNTER — Encounter (HOSPITAL_COMMUNITY): Payer: Self-pay | Admitting: Cardiology

## 2023-12-21 ENCOUNTER — Emergency Department (HOSPITAL_COMMUNITY)

## 2023-12-21 ENCOUNTER — Other Ambulatory Visit: Payer: Self-pay

## 2023-12-21 DIAGNOSIS — G4733 Obstructive sleep apnea (adult) (pediatric): Secondary | ICD-10-CM

## 2023-12-21 DIAGNOSIS — S2241XA Multiple fractures of ribs, right side, initial encounter for closed fracture: Secondary | ICD-10-CM | POA: Diagnosis not present

## 2023-12-21 DIAGNOSIS — I4819 Other persistent atrial fibrillation: Secondary | ICD-10-CM | POA: Diagnosis present

## 2023-12-21 DIAGNOSIS — S2249XA Multiple fractures of ribs, unspecified side, initial encounter for closed fracture: Secondary | ICD-10-CM | POA: Diagnosis present

## 2023-12-21 DIAGNOSIS — Z8616 Personal history of COVID-19: Secondary | ICD-10-CM

## 2023-12-21 DIAGNOSIS — I878 Other specified disorders of veins: Secondary | ICD-10-CM | POA: Diagnosis present

## 2023-12-21 DIAGNOSIS — K219 Gastro-esophageal reflux disease without esophagitis: Secondary | ICD-10-CM | POA: Diagnosis present

## 2023-12-21 DIAGNOSIS — E119 Type 2 diabetes mellitus without complications: Secondary | ICD-10-CM | POA: Diagnosis present

## 2023-12-21 DIAGNOSIS — Z882 Allergy status to sulfonamides status: Secondary | ICD-10-CM

## 2023-12-21 DIAGNOSIS — R079 Chest pain, unspecified: Secondary | ICD-10-CM | POA: Diagnosis not present

## 2023-12-21 DIAGNOSIS — J9601 Acute respiratory failure with hypoxia: Secondary | ICD-10-CM | POA: Diagnosis not present

## 2023-12-21 DIAGNOSIS — I4891 Unspecified atrial fibrillation: Secondary | ICD-10-CM | POA: Diagnosis not present

## 2023-12-21 DIAGNOSIS — Z96653 Presence of artificial knee joint, bilateral: Secondary | ICD-10-CM | POA: Diagnosis present

## 2023-12-21 DIAGNOSIS — Z8249 Family history of ischemic heart disease and other diseases of the circulatory system: Secondary | ICD-10-CM

## 2023-12-21 DIAGNOSIS — Z885 Allergy status to narcotic agent status: Secondary | ICD-10-CM

## 2023-12-21 DIAGNOSIS — Z96652 Presence of left artificial knee joint: Secondary | ICD-10-CM | POA: Diagnosis present

## 2023-12-21 DIAGNOSIS — I5032 Chronic diastolic (congestive) heart failure: Secondary | ICD-10-CM | POA: Diagnosis present

## 2023-12-21 DIAGNOSIS — Z6841 Body Mass Index (BMI) 40.0 and over, adult: Secondary | ICD-10-CM

## 2023-12-21 DIAGNOSIS — Z7901 Long term (current) use of anticoagulants: Secondary | ICD-10-CM

## 2023-12-21 DIAGNOSIS — Z7984 Long term (current) use of oral hypoglycemic drugs: Secondary | ICD-10-CM

## 2023-12-21 DIAGNOSIS — Z79899 Other long term (current) drug therapy: Secondary | ICD-10-CM

## 2023-12-21 DIAGNOSIS — T501X6A Underdosing of loop [high-ceiling] diuretics, initial encounter: Secondary | ICD-10-CM | POA: Diagnosis present

## 2023-12-21 DIAGNOSIS — E785 Hyperlipidemia, unspecified: Secondary | ICD-10-CM | POA: Diagnosis present

## 2023-12-21 DIAGNOSIS — Z90710 Acquired absence of both cervix and uterus: Secondary | ICD-10-CM

## 2023-12-21 DIAGNOSIS — L97219 Non-pressure chronic ulcer of right calf with unspecified severity: Secondary | ICD-10-CM | POA: Diagnosis present

## 2023-12-21 DIAGNOSIS — S2243XA Multiple fractures of ribs, bilateral, initial encounter for closed fracture: Principal | ICD-10-CM

## 2023-12-21 DIAGNOSIS — I110 Hypertensive heart disease with heart failure: Secondary | ICD-10-CM | POA: Diagnosis present

## 2023-12-21 DIAGNOSIS — M8580 Other specified disorders of bone density and structure, unspecified site: Secondary | ICD-10-CM | POA: Diagnosis present

## 2023-12-21 DIAGNOSIS — Z91048 Other nonmedicinal substance allergy status: Secondary | ICD-10-CM

## 2023-12-21 DIAGNOSIS — Z86718 Personal history of other venous thrombosis and embolism: Secondary | ICD-10-CM

## 2023-12-21 DIAGNOSIS — Z888 Allergy status to other drugs, medicaments and biological substances status: Secondary | ICD-10-CM

## 2023-12-21 DIAGNOSIS — K5900 Constipation, unspecified: Secondary | ICD-10-CM | POA: Diagnosis not present

## 2023-12-21 DIAGNOSIS — Z86711 Personal history of pulmonary embolism: Secondary | ICD-10-CM

## 2023-12-21 DIAGNOSIS — Z91013 Allergy to seafood: Secondary | ICD-10-CM

## 2023-12-21 DIAGNOSIS — I10 Essential (primary) hypertension: Secondary | ICD-10-CM | POA: Diagnosis present

## 2023-12-21 DIAGNOSIS — H353130 Nonexudative age-related macular degeneration, bilateral, stage unspecified: Secondary | ICD-10-CM | POA: Diagnosis present

## 2023-12-21 DIAGNOSIS — Z860100 Personal history of colon polyps, unspecified: Secondary | ICD-10-CM

## 2023-12-21 DIAGNOSIS — I7121 Aneurysm of the ascending aorta, without rupture: Secondary | ICD-10-CM | POA: Diagnosis present

## 2023-12-21 DIAGNOSIS — E1169 Type 2 diabetes mellitus with other specified complication: Secondary | ICD-10-CM | POA: Diagnosis present

## 2023-12-21 DIAGNOSIS — E739 Lactose intolerance, unspecified: Secondary | ICD-10-CM | POA: Diagnosis present

## 2023-12-21 DIAGNOSIS — I517 Cardiomegaly: Secondary | ICD-10-CM | POA: Diagnosis not present

## 2023-12-21 DIAGNOSIS — Z887 Allergy status to serum and vaccine status: Secondary | ICD-10-CM

## 2023-12-21 DIAGNOSIS — M96A3 Multiple fractures of ribs associated with chest compression and cardiopulmonary resuscitation: Principal | ICD-10-CM | POA: Diagnosis present

## 2023-12-21 LAB — BASIC METABOLIC PANEL
Anion gap: 13 (ref 5–15)
BUN: 26 mg/dL — ABNORMAL HIGH (ref 8–23)
CO2: 22 mmol/L (ref 22–32)
Calcium: 9.9 mg/dL (ref 8.9–10.3)
Chloride: 105 mmol/L (ref 98–111)
Creatinine, Ser: 0.65 mg/dL (ref 0.44–1.00)
GFR, Estimated: >60 mL/min (ref >60)
Glucose, Bld: 125 mg/dL — ABNORMAL HIGH (ref 70–99)
Potassium: 4.2 mmol/L (ref 3.5–5.1)
Sodium: 140 mmol/L (ref 135–145)

## 2023-12-21 LAB — CBC
HCT: 41.7 % (ref 36.0–46.0)
Hemoglobin: 12.5 g/dL (ref 12.0–15.0)
MCH: 27.7 pg (ref 26.0–34.0)
MCHC: 30 g/dL (ref 30.0–36.0)
MCV: 92.5 fL (ref 80.0–100.0)
Platelets: 133 10*3/uL — ABNORMAL LOW (ref 150–400)
RBC: 4.51 MIL/uL (ref 3.87–5.11)
RDW: 17.2 % — ABNORMAL HIGH (ref 11.5–15.5)
WBC: 6.9 10*3/uL (ref 4.0–10.5)
nRBC: 0 % (ref 0.0–0.2)

## 2023-12-21 LAB — TROPONIN I (HIGH SENSITIVITY): Troponin I (High Sensitivity): 7 ng/L (ref <18)

## 2023-12-21 LAB — BRAIN NATRIURETIC PEPTIDE: B Natriuretic Peptide: 45.7 pg/mL (ref 0.0–100.0)

## 2023-12-21 MED ORDER — HYDROMORPHONE HCL 1 MG/ML IJ SOLN
0.5000 mg | INTRAMUSCULAR | Status: DC | PRN
  Administered 2023-12-22: 0.5 mg via INTRAVENOUS
  Filled 2023-12-21: qty 1
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
## 2023-12-21 NOTE — ED Provider Notes (Signed)
 [place] [facility]ER[facility] DEPARTMENT AT [facility] Provider Note  CSN: [id number] Arrival date & time: 12/21/23 2218  Chief Complaint(s) Chest Pain  HPI Natasha Glass is a 83 y.o. female {Add pertinent medical, surgical, social history, OB history to HPI:1}    Chest Pain   Past Medical History Past Medical History:  Diagnosis Date   Anemia    Arthritis    CHF (congestive heart failure) (HCC)    DVT (deep venous thrombosis) (HCC) 01/2009   Dysrhythmia    GERD (gastroesophageal reflux disease)    Gout    History of blood transfusion    1990's   History of colonic polyps 09/2010   History of pneumonia    Hyperlipidemia    Hypertension    Lactose intolerance    Macular degeneration, dry    bilateral   Migraine    Obesity    Osteopenia    PAF (paroxysmal atrial fibrillation) (HCC)    Peripheral neuropathy    Pulmonary embolism (HCC) 01/2009   Sleep apnea    CPAP pressure 4.0-16.0   Thoracic ascending aortic aneurysm (HCC)    Type II diabetes mellitus (HCC)    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)    Urinary incontinence    Patient Active Problem List   Diagnosis Date Noted   COVID-19 05/22/2023   Paroxysmal A-fib (HCC) 05/22/2023   Encounter for monitoring dofetilide therapy 03/04/2023   Atrial fibrillation (HCC) 05/01/2022   Atrial fibrillation with rapid ventricular response (HCC) 04/30/2022   Fall at home, initial encounter 04/30/2022   Chronic diastolic (congestive) heart failure (HCC) 04/30/2022   OSA on CPAP 04/30/2022   GERD without esophagitis 04/30/2022   Chest pain 04/30/2022   COVID-19 virus infection 04/30/2022   Chronic anticoagulation 09/10/2021   Essential hypertension 08/30/2021   Aortic atherosclerosis (HCC) 08/30/2021   Type 2 diabetes mellitus with morbid obesity (HCC) 08/30/2021   Bilateral lower extremity edema 08/30/2021   Gout 08/30/2021   Thrombocytopenia (HCC) 08/30/2021   Mixed diabetic hyperlipidemia associated  with type 2 diabetes mellitus (HCC) 08/30/2021   Cellulitis 08/27/2021   Sepsis (HCC) 08/26/2021   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/2019   Hypercoagulable state due to persistent atrial fibrillation (HCC) 09/17/2019   Pain in joint of right shoulder 09/22/2018   Persistent atrial fibrillation (HCC) 08/18/2018   PAF (paroxysmal atrial fibrillation) (HCC)    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 Authorizing Provider  acetaminophen (TYLENOL) 500 MG tablet Take 500 mg by mouth at bedtime.    [provider]  albuterol (VENTOLIN HFA) 108 (90 Base) MCG/ACT inhaler Inhale 2 puffs into the lungs every 6 (six) hours as needed for wheezing or shortness of breath. 05/23/23   Ghimire, Werner Lean, MD  allopurinol (ZYLOPRIM) 300 MG tablet Take 0.5 tablets (150 mg total) by mouth every evening. 09/14/21   Sharee Holster, NP  Cholecalciferol (VITAMIN D3) 25 MCG (1000 UT) CAPS Take 2,000 Units  by mouth in the morning.    [provider]  ciprofloxacin (CILOXAN) 0.3 % ophthalmic solution Place 1 drop into both eyes See admin instructions. First day- 1 drop 4 times daily x 2 days for eye injection 11/07/23   [provider]  dofetilide (TIKOSYN) 250 MCG capsule Take 1 capsule (250 mcg total) by mouth 2 (two) times daily. 09/30/23   [name], PA-C  ELIQUIS 5 MG TABS  tablet Take 1 tablet (5 mg total) by mouth 2 (two) times daily. 10/17/23   Lewayne Bunting, MD  furosemide (LASIX) 40 MG tablet 1 tablet by mouth once daily, may take extra 1.5 tablets as needed for swelling 10/31/23   Lewayne Bunting, MD  Hyprom-Naphaz-Polysorb-Zn Sulf (CLEAR EYES COMPLETE) SOLN Place 1 drop into both eyes as needed (itching).    [provider]  losartan (COZAAR) 25 MG tablet Take 1 tablet (25 mg total) by mouth daily. 09/14/21   Sharee Holster, NP  Magnesium 250 MG TABS Take 250 mg by mouth at bedtime.    [provider]  metFORMIN (GLUCOPHAGE) 500 MG tablet Take 1 tablet (500 mg total) by mouth 2 (two) times daily with a meal. 09/14/21   Chilton Si, Chong Sicilian, NP  metoprolol succinate (TOPROL-XL) 25 MG 24 hr tablet Take 0.5 tablets (12.5 mg total) by mouth at bedtime. Patient taking differently: Take 25 mg by mouth at bedtime. 08/25/23 02/15/25  [name], PA-C  Multiple Vitamins-Minerals (PRESERVISION AREDS 2) CAPS Take 2 capsules by mouth daily with lunch.    [provider]  potassium chloride SA (KLOR-CON M) 20 MEQ tablet Take 1 tablet (20 mEq total) by mouth in the morning. 08/13/22   Quintella Reichert, MD  rosuvastatin (CRESTOR) 5 MG tablet Take 5 mg by mouth in the morning. 03/26/22   [provider]  vitamin B-12 (CYANOCOBALAMIN) 500 MCG tablet Take 500 mcg by mouth in the morning.    [provider]                                                                                                                                    Allergies Bactrim [sulfamethoxazole-trimethoprim], Morphine and codeine, Oysters [shellfish allergy], Amlodipine, Percocet [oxycodone-acetaminophen], Tdap [tetanus-diphth-acell pertussis], Tetanus toxoids, Tramadol, Lactose intolerance (gi), Other, Tegretol [carbamazepine], Tetanus-diphtheria toxoids td, Topiramate, Diovan [valsartan], Diphtheria toxoid, and Macrodantin [nitrofurantoin  macrocrystal]  Review of Systems Review of Systems  Cardiovascular:  Positive for chest pain.   As noted in HPI  Physical Exam Vital Signs  I have reviewed the triage vital signs BP (!) 150/99 (BP Location: Right Arm)   Pulse (!) 139   Temp 98.6 F (37 C) (Oral)   Resp 18   Wt 118 kg   SpO2 94%   BMI 46.09 kg/m  *** Physical Exam  ED Results and Treatments Labs (all labs ordered are listed, but only abnormal results are displayed)  Labs Reviewed  CBC - Abnormal; Notable for the following components:      Result Value   RDW 17.2 (*)    Platelets 133 (*)    All other components within normal limits  BASIC METABOLIC PANEL  BRAIN NATRIURETIC PEPTIDE  TROPONIN I (HIGH SENSITIVITY)                                                                                                                         EKG  EKG Interpretation Date/Time:  Sunday December 21 2023 22:37:22 EDT Ventricular Rate:  114 PR Interval:    QRS Duration:  66 QT Interval:  330 QTC Calculation: 454 R Axis:   87  Text Interpretation: Atrial fibrillation with rapid ventricular response with premature ventricular or aberrantly conducted complexes Low voltage QRS Septal infarct , age undetermined Abnormal ECG When compared with ECG of 19-Dec-2023 11:04, PREVIOUS ECG IS PRESENT Poor data quality, interpretation may be adversely affected Confirmed by Drema Pry [phone]) on 12/21/2023 11:31:57 PM       Radiology DG Chest Portable 1 View Result Date: 12/21/2023 CLINICAL DATA:  Chest pain EXAM: PORTABLE CHEST 1 VIEW COMPARISON:  Chest x-ray 05/21/2023 FINDINGS: Heart is enlarged, unchanged. There is no focal lung infiltrate, pleural effusion or pneumothorax. No acute fractures are seen. IMPRESSION: No active disease. Electronically Signed   By: Darliss Cheney M.D.   On: 12/21/2023 23:23    Medications Ordered in ED Medications  HYDROmorphone (DILAUDID) injection 0.5 mg (has no administration in time range)    Procedures Procedures  (including critical care time) Medical Decision Making / ED Course   Medical Decision Making Amount and/or Complexity of Data Reviewed Labs: ordered. Radiology: ordered.  Risk Prescription drug management.    ***    Final Clinical Impression(s) / ED Diagnoses Final diagnoses:  None    This chart was dictated using voice recognition software.  Despite best efforts to proofread,  errors can occur which can change the documentation meaning.

## 2023-12-21 NOTE — ED Notes (Signed)
 Pt unable to sit still enough for EKG to be taken.

## 2023-12-21 NOTE — ED Notes (Signed)
2LNC applied 

## 2023-12-22 ENCOUNTER — Emergency Department (HOSPITAL_COMMUNITY)

## 2023-12-22 ENCOUNTER — Encounter (HOSPITAL_COMMUNITY): Payer: Self-pay | Admitting: Family Medicine

## 2023-12-22 DIAGNOSIS — S2249XA Multiple fractures of ribs, unspecified side, initial encounter for closed fracture: Secondary | ICD-10-CM | POA: Diagnosis present

## 2023-12-22 DIAGNOSIS — I2510 Atherosclerotic heart disease of native coronary artery without angina pectoris: Secondary | ICD-10-CM | POA: Diagnosis not present

## 2023-12-22 DIAGNOSIS — I4891 Unspecified atrial fibrillation: Secondary | ICD-10-CM

## 2023-12-22 DIAGNOSIS — I480 Paroxysmal atrial fibrillation: Secondary | ICD-10-CM | POA: Diagnosis not present

## 2023-12-22 DIAGNOSIS — K5900 Constipation, unspecified: Secondary | ICD-10-CM | POA: Diagnosis not present

## 2023-12-22 DIAGNOSIS — M6281 Muscle weakness (generalized): Secondary | ICD-10-CM | POA: Diagnosis not present

## 2023-12-22 DIAGNOSIS — E1169 Type 2 diabetes mellitus with other specified complication: Secondary | ICD-10-CM

## 2023-12-22 DIAGNOSIS — M96A3 Multiple fractures of ribs associated with chest compression and cardiopulmonary resuscitation: Secondary | ICD-10-CM | POA: Diagnosis present

## 2023-12-22 DIAGNOSIS — S2241XA Multiple fractures of ribs, right side, initial encounter for closed fracture: Secondary | ICD-10-CM | POA: Diagnosis not present

## 2023-12-22 DIAGNOSIS — K51919 Ulcerative colitis, unspecified with unspecified complications: Secondary | ICD-10-CM | POA: Diagnosis not present

## 2023-12-22 DIAGNOSIS — S2243XA Multiple fractures of ribs, bilateral, initial encounter for closed fracture: Secondary | ICD-10-CM | POA: Diagnosis not present

## 2023-12-22 DIAGNOSIS — I4819 Other persistent atrial fibrillation: Secondary | ICD-10-CM

## 2023-12-22 DIAGNOSIS — Z8616 Personal history of COVID-19: Secondary | ICD-10-CM | POA: Diagnosis not present

## 2023-12-22 DIAGNOSIS — J9690 Respiratory failure, unspecified, unspecified whether with hypoxia or hypercapnia: Secondary | ICD-10-CM | POA: Diagnosis not present

## 2023-12-22 DIAGNOSIS — G4733 Obstructive sleep apnea (adult) (pediatric): Secondary | ICD-10-CM

## 2023-12-22 DIAGNOSIS — E785 Hyperlipidemia, unspecified: Secondary | ICD-10-CM | POA: Diagnosis present

## 2023-12-22 DIAGNOSIS — M51369 Other intervertebral disc degeneration, lumbar region without mention of lumbar back pain or lower extremity pain: Secondary | ICD-10-CM | POA: Diagnosis not present

## 2023-12-22 DIAGNOSIS — I5032 Chronic diastolic (congestive) heart failure: Secondary | ICD-10-CM

## 2023-12-22 DIAGNOSIS — H353130 Nonexudative age-related macular degeneration, bilateral, stage unspecified: Secondary | ICD-10-CM | POA: Diagnosis present

## 2023-12-22 DIAGNOSIS — S2242XB Multiple fractures of ribs, left side, initial encounter for open fracture: Secondary | ICD-10-CM | POA: Diagnosis not present

## 2023-12-22 DIAGNOSIS — M8580 Other specified disorders of bone density and structure, unspecified site: Secondary | ICD-10-CM | POA: Diagnosis present

## 2023-12-22 DIAGNOSIS — J9601 Acute respiratory failure with hypoxia: Secondary | ICD-10-CM

## 2023-12-22 DIAGNOSIS — S2242XA Multiple fractures of ribs, left side, initial encounter for closed fracture: Secondary | ICD-10-CM | POA: Diagnosis not present

## 2023-12-22 DIAGNOSIS — R262 Difficulty in walking, not elsewhere classified: Secondary | ICD-10-CM | POA: Diagnosis not present

## 2023-12-22 DIAGNOSIS — Z96653 Presence of artificial knee joint, bilateral: Secondary | ICD-10-CM | POA: Diagnosis present

## 2023-12-22 DIAGNOSIS — I878 Other specified disorders of veins: Secondary | ICD-10-CM | POA: Diagnosis present

## 2023-12-22 DIAGNOSIS — R0902 Hypoxemia: Secondary | ICD-10-CM | POA: Diagnosis not present

## 2023-12-22 DIAGNOSIS — K219 Gastro-esophageal reflux disease without esophagitis: Secondary | ICD-10-CM | POA: Diagnosis not present

## 2023-12-22 DIAGNOSIS — E119 Type 2 diabetes mellitus without complications: Secondary | ICD-10-CM | POA: Diagnosis present

## 2023-12-22 DIAGNOSIS — S2249XD Multiple fractures of ribs, unspecified side, subsequent encounter for fracture with routine healing: Secondary | ICD-10-CM | POA: Diagnosis not present

## 2023-12-22 DIAGNOSIS — Z7984 Long term (current) use of oral hypoglycemic drugs: Secondary | ICD-10-CM | POA: Diagnosis not present

## 2023-12-22 DIAGNOSIS — R278 Other lack of coordination: Secondary | ICD-10-CM | POA: Diagnosis not present

## 2023-12-22 DIAGNOSIS — L97219 Non-pressure chronic ulcer of right calf with unspecified severity: Secondary | ICD-10-CM | POA: Diagnosis present

## 2023-12-22 DIAGNOSIS — I7121 Aneurysm of the ascending aorta, without rupture: Secondary | ICD-10-CM | POA: Diagnosis not present

## 2023-12-22 DIAGNOSIS — T501X6A Underdosing of loop [high-ceiling] diuretics, initial encounter: Secondary | ICD-10-CM | POA: Diagnosis present

## 2023-12-22 DIAGNOSIS — Z9981 Dependence on supplemental oxygen: Secondary | ICD-10-CM | POA: Diagnosis not present

## 2023-12-22 DIAGNOSIS — M109 Gout, unspecified: Secondary | ICD-10-CM | POA: Diagnosis not present

## 2023-12-22 DIAGNOSIS — Z743 Need for continuous supervision: Secondary | ICD-10-CM | POA: Diagnosis not present

## 2023-12-22 DIAGNOSIS — D3501 Benign neoplasm of right adrenal gland: Secondary | ICD-10-CM | POA: Diagnosis not present

## 2023-12-22 DIAGNOSIS — Z96652 Presence of left artificial knee joint: Secondary | ICD-10-CM | POA: Diagnosis present

## 2023-12-22 DIAGNOSIS — Z9989 Dependence on other enabling machines and devices: Secondary | ICD-10-CM | POA: Diagnosis not present

## 2023-12-22 DIAGNOSIS — L97918 Non-pressure chronic ulcer of unspecified part of right lower leg with other specified severity: Secondary | ICD-10-CM | POA: Diagnosis not present

## 2023-12-22 DIAGNOSIS — Z741 Need for assistance with personal care: Secondary | ICD-10-CM | POA: Diagnosis not present

## 2023-12-22 DIAGNOSIS — I700 Atherosclerosis of aorta: Secondary | ICD-10-CM | POA: Diagnosis not present

## 2023-12-22 DIAGNOSIS — Z6841 Body Mass Index (BMI) 40.0 and over, adult: Secondary | ICD-10-CM | POA: Diagnosis not present

## 2023-12-22 DIAGNOSIS — Z86711 Personal history of pulmonary embolism: Secondary | ICD-10-CM | POA: Diagnosis not present

## 2023-12-22 DIAGNOSIS — Z9181 History of falling: Secondary | ICD-10-CM | POA: Diagnosis not present

## 2023-12-22 DIAGNOSIS — E739 Lactose intolerance, unspecified: Secondary | ICD-10-CM | POA: Diagnosis present

## 2023-12-22 DIAGNOSIS — Z7901 Long term (current) use of anticoagulants: Secondary | ICD-10-CM | POA: Diagnosis not present

## 2023-12-22 DIAGNOSIS — I110 Hypertensive heart disease with heart failure: Secondary | ICD-10-CM | POA: Diagnosis not present

## 2023-12-22 DIAGNOSIS — E782 Mixed hyperlipidemia: Secondary | ICD-10-CM | POA: Diagnosis not present

## 2023-12-22 LAB — BASIC METABOLIC PANEL
Anion gap: 8 (ref 5–15)
BUN: 23 mg/dL (ref 8–23)
CO2: 25 mmol/L (ref 22–32)
Calcium: 9.2 mg/dL (ref 8.9–10.3)
Chloride: 108 mmol/L (ref 98–111)
Creatinine, Ser: 0.74 mg/dL (ref 0.44–1.00)
GFR, Estimated: >60 mL/min (ref >60)
Glucose, Bld: 120 mg/dL — ABNORMAL HIGH (ref 70–99)
Potassium: 4.4 mmol/L (ref 3.5–5.1)
Sodium: 141 mmol/L (ref 135–145)

## 2023-12-22 LAB — GLUCOSE, CAPILLARY
Glucose-Capillary: 101 mg/dL — ABNORMAL HIGH (ref 70–99)
Glucose-Capillary: 135 mg/dL — ABNORMAL HIGH (ref 70–99)

## 2023-12-22 LAB — CBC
HCT: 38.7 % (ref 36.0–46.0)
Hemoglobin: 11.7 g/dL — ABNORMAL LOW (ref 12.0–15.0)
MCH: 27.9 pg (ref 26.0–34.0)
MCHC: 30.2 g/dL (ref 30.0–36.0)
MCV: 92.4 fL (ref 80.0–100.0)
Platelets: 106 10*3/uL — ABNORMAL LOW (ref 150–400)
RBC: 4.19 MIL/uL (ref 3.87–5.11)
RDW: 16.9 % — ABNORMAL HIGH (ref 11.5–15.5)
WBC: 5.7 10*3/uL (ref 4.0–10.5)
nRBC: 0 % (ref 0.0–0.2)

## 2023-12-22 LAB — TROPONIN I (HIGH SENSITIVITY): Troponin I (High Sensitivity): 5 ng/L (ref <18)

## 2023-12-22 LAB — CBG MONITORING, ED: Glucose-Capillary: 84 mg/dL (ref 70–99)

## 2023-12-22 LAB — MAGNESIUM: Magnesium: 1.9 mg/dL (ref 1.7–2.4)

## 2023-12-22 LAB — HEMOGLOBIN A1C
Hgb A1c MFr Bld: 6.5 % — ABNORMAL HIGH (ref 4.8–5.6)
Mean Plasma Glucose: 139.85 mg/dL

## 2023-12-22 MED ORDER — LOSARTAN POTASSIUM 25 MG PO TABS
25.0000 mg | ORAL_TABLET | Freq: Every day | ORAL | Status: DC
  Administered 2023-12-22 – 2023-12-29 (×8): 25 mg via ORAL
  Filled 2023-12-22 (×8): qty 1

## 2023-12-22 MED ORDER — BISACODYL 5 MG PO TBEC: 5.0000 mg | DELAYED_RELEASE_TABLET | Freq: Every day | ORAL | Status: DC | PRN

## 2023-12-22 MED ORDER — POLYETHYLENE GLYCOL 3350 17 G PO PACK: 17.0000 g | PACK | Freq: Every day | ORAL | Status: DC | PRN

## 2023-12-22 MED ORDER — INSULIN ASPART 100 UNIT/ML IJ SOLN: 0.0000 [IU] | Freq: Every day | INTRAMUSCULAR | Status: DC

## 2023-12-22 MED ORDER — FUROSEMIDE 40 MG PO TABS
40.0000 mg | ORAL_TABLET | Freq: Every day | ORAL | Status: DC
  Administered 2023-12-22 – 2023-12-29 (×8): 40 mg via ORAL
  Filled 2023-12-22: qty 2
  Filled 2023-12-22 (×7): qty 1

## 2023-12-22 MED ORDER — METOPROLOL SUCCINATE ER 25 MG PO TB24
25.0000 mg | ORAL_TABLET | Freq: Every day | ORAL | Status: DC
  Administered 2023-12-22 – 2023-12-28 (×6): 25 mg via ORAL
  Filled 2023-12-22 (×6): qty 1

## 2023-12-22 MED ORDER — SODIUM CHLORIDE 0.9% FLUSH
3.0000 mL | Freq: Two times a day (BID) | INTRAVENOUS | Status: DC
  Administered 2023-12-22 – 2023-12-29 (×15): 3 mL via INTRAVENOUS

## 2023-12-22 MED ORDER — HYDROMORPHONE HCL 1 MG/ML IJ SOLN
0.5000 mg | INTRAMUSCULAR | Status: AC | PRN
  Administered 2023-12-22 (×2): 0.5 mg via INTRAVENOUS
  Filled 2023-12-22 (×2): qty 1

## 2023-12-22 MED ORDER — IOHEXOL 350 MG/ML SOLN
75.0000 mL | Freq: Once | INTRAVENOUS | Status: AC | PRN
  Administered 2023-12-22: 75 mL via INTRAVENOUS

## 2023-12-22 MED ORDER — ROSUVASTATIN CALCIUM 5 MG PO TABS
5.0000 mg | ORAL_TABLET | Freq: Every morning | ORAL | Status: DC
  Administered 2023-12-22 – 2023-12-29 (×8): 5 mg via ORAL
  Filled 2023-12-22 (×8): qty 1

## 2023-12-22 MED ORDER — HYDROCODONE-ACETAMINOPHEN 5-325 MG PO TABS
1.0000 | ORAL_TABLET | Freq: Once | ORAL | Status: AC
  Administered 2023-12-22: 1 via ORAL
  Filled 2023-12-22: qty 1

## 2023-12-22 MED ORDER — ACETAMINOPHEN 325 MG PO TABS: 650.0000 mg | ORAL_TABLET | Freq: Four times a day (QID) | ORAL | Status: DC | PRN

## 2023-12-22 MED ORDER — APIXABAN 5 MG PO TABS
5.0000 mg | ORAL_TABLET | Freq: Two times a day (BID) | ORAL | Status: DC
  Administered 2023-12-22 – 2023-12-29 (×15): 5 mg via ORAL
  Filled 2023-12-22 (×15): qty 1

## 2023-12-22 MED ORDER — HYDROMORPHONE HCL 1 MG/ML IJ SOLN
0.5000 mg | INTRAMUSCULAR | Status: DC | PRN
  Administered 2023-12-23 – 2023-12-28 (×7): 0.5 mg via INTRAVENOUS
  Filled 2023-12-22 (×7): qty 0.5

## 2023-12-22 MED ORDER — HYDROCODONE-ACETAMINOPHEN 5-325 MG PO TABS
1.0000 | ORAL_TABLET | ORAL | Status: DC | PRN
  Administered 2023-12-22 – 2023-12-28 (×20): 1 via ORAL
  Filled 2023-12-22 (×20): qty 1

## 2023-12-22 MED ORDER — INSULIN ASPART 100 UNIT/ML IJ SOLN
0.0000 [IU] | Freq: Three times a day (TID) | INTRAMUSCULAR | Status: DC
  Administered 2023-12-23 – 2023-12-25 (×5): 1 [IU] via SUBCUTANEOUS
  Administered 2023-12-26: 2 [IU] via SUBCUTANEOUS

## 2023-12-22 MED ORDER — ACETAMINOPHEN 650 MG RE SUPP: 650.0000 mg | Freq: Four times a day (QID) | RECTAL | Status: DC | PRN

## 2023-12-22 MED ORDER — DOFETILIDE 250 MCG PO CAPS
250.0000 ug | ORAL_CAPSULE | Freq: Two times a day (BID) | ORAL | Status: DC
  Administered 2023-12-22 – 2023-12-29 (×15): 250 ug via ORAL
  Filled 2023-12-22 (×16): qty 1
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
## 2023-12-22 NOTE — ED Notes (Signed)
 Pt stating she is very uncomfortable.  Repositioned patient to elevate knees and adjusted bed.  Pt states she feels a little better.  Pt cell phone and glass case in a belongings bag at bedside.   Pt has grip socks and yellow falls band in place.   Pt on monitor and shows no signs of distress.

## 2023-12-22 NOTE — ED Notes (Signed)
 Provided pt with heating pads to help with pain management

## 2023-12-22 NOTE — Hospital Course (Signed)
 83 y.o. female with medical history significant for hypertension, type 2 diabetes mellitus, BMI 46, OSA on CPAP, atrial fibrillation on Eliquis, chronic HFpEF, and electrical cardioversion on 12/19/2023 complicated by long pauses/transient asystole treated with several seconds of CPR, now presenting with chest pain and shortness of breath.   Patient had some chest discomfort initially after the procedure and brief CPR on 12/19/2023, but felt well enough to return home at the time.  Unfortunately, her chest discomfort has progressively worsened and is now severe when she takes a deep breath, coughs, or moves.  She has not been taking her Lasix due to severe chest pain when she gets up to urinate.   ED Course: Upon arrival to the ED, patient is found to be afebrile and saturating mid 80s on room air with tachypnea, tachycardia, and elevated blood pressure.  EKG demonstrates atrial fibrillation with rate 114.  CTA chest is most notable for left anterolateral rib fractures involving ribs 2 through 7, as well as ribs 3 through 5 on the right.

## 2023-12-22 NOTE — ED Notes (Signed)
 Pt asking about lunch tray.  Informed pt waiting for them to be delivered
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
## 2023-12-22 NOTE — Progress Notes (Signed)
   Patient Name: Natasha Glass Date of Encounter: 12/22/2023 [facility] Cardiologist: [name], MD   Interval Summary  .    Patient's status post DCCV on the seventh unfortunately after third shock had transient long pauses/transient asystole and had 3 to 5 seconds of CPR.  Now here for chest pain and rib fractures.  Vital Signs .    Vitals:   12/22/23 0700 12/22/23 0730 12/22/23 0800 12/22/23 0840  BP: (!) 129/92 119/83 (!) 132/94   Pulse: (!) 111     Resp: 13 14 (!) 21   Temp:    97.7 F (36.5 C)  TempSrc:    Oral  SpO2: 100%     Weight:       No intake or output data in the 24 hours ending 12/22/23 0925    12/21/2023   10:37 PM 12/16/2023   10:08 AM 12/08/2023   11:28 AM  Last 3 Weights  Weight (lbs) 260 lb 3.2 oz 260 lb 3.2 oz 265 lb  Weight (kg) 118.026 kg 118.026 kg 120.203 kg      Telemetry/ECG    Atrial fibrillation heart rate 105- Personally Reviewed  CV Studies      Physical Exam .   GEN: No acute distress.   Neck: No JVD Cardiac: Irregularly irregular Respiratory: Clear to auscultation bilaterally. GI: Soft, nontender, non-distended  MS: No edema.  Venous stasis  Patient Profile    Natasha Glass is a 83 y.o. female has hx of h/o stable ascending aortic aneurysm at 4.5 cm, followed by Dr. [name], DM II, OSA with CPAP, Arthritis, DVT, GERD, obesity, anemia, HTN,chronic LLE.  Currently admitted for chest pain secondary to rib fractures after getting CPR  Assessment & Plan .     Persistent atrial fibrillation Status post DCCV 3/7 During cardioversion patient noted to have gone into transient sinus with long pause versus transient asystole.  Per reports received 3 to 5 seconds of CPR or a total of 4 chest compressions.  Here now due to rib fractures and pain management.  She is in A-fib today heart rate around 100.  Previous note reported she was paroxysmal but current telemetry shows A-fib, may be she switched rooms Continue with Eliquis  5 mg twice daily.  On Tikosyn 250 twice daily, Toprol-XL 25 mg. Has not missed any of her Eliquis and has been counseled on ongoing use.  OSA on CPAP  Already has follow-up with A-fib clinic on the 21st.  For questions or updates, please contact [facility] Please consult [email or web address] for contact info under        Signed, Abagail Kitchens, PA-C

## 2023-12-22 NOTE — Progress Notes (Signed)
  Progress Note   Patient: Natasha Glass NWG:[id number] DOB: 1941/06/18 DOA: 12/21/2023     0 DOS: the patient was seen and examined on 12/22/2023   Brief hospital course: 83 y.o. female with medical history significant for hypertension, type 2 diabetes mellitus, BMI 46, OSA on CPAP, atrial fibrillation on Eliquis, chronic HFpEF, and electrical cardioversion on 12/19/2023 complicated by long pauses/transient asystole treated with several seconds of CPR, now presenting with chest pain and shortness of breath.   Patient had some chest discomfort initially after the procedure and brief CPR on 12/19/2023, but felt well enough to return home at the time.  Unfortunately, her chest discomfort has progressively worsened and is now severe when she takes a deep breath, coughs, or moves.  She has not been taking her Lasix due to severe chest pain when she gets up to urinate.   ED Course: Upon arrival to the ED, patient is found to be afebrile and saturating mid 80s on room air with tachypnea, tachycardia, and elevated blood pressure.  EKG demonstrates atrial fibrillation with rate 114.  CTA chest is most notable for left anterolateral rib fractures involving ribs 2 through 7, as well as ribs 3 through 5 on the right.  Assessment and Plan: 1. Rib fractures; acute hypoxic respiratory failure   - Continue pain-control and incentive spirometry, continue supplemental O2 as-needed   - Has required IV dilaudid today, continues with 10/10 pain still requiring IV narcotic. Will resume IV dilaudid PRN for now -consult PT/OT   2. PAF with RVR  - Rate improved with pain-control in ED  - Continue Eliquis, Tikosyn, metoprolol  - Seen by Cardiology. Recs to continue Tikosyn, BB, eliquis and can d/c from Cardiology perspective once pain is better controlled   3. Chronic HFpEF  - Resume Lasix    4. Type II DM  - A1c was 6.5% in August 2024   - Check CBGs and use low-intensity SSI for now    5. OSA  - CPAP while  sleeping    6. Chronic right leg wound - Wound care        Subjective: Still complaining of marked chest paisn  Physical Exam: Vitals:   12/22/23 1330 12/22/23 1345 12/22/23 1400 12/22/23 1433  BP: (!) 131/94 (!) 128/95 119/89 (!) 137/96  Pulse:    (!) 115  Resp: 14 17 16 19   Temp:   98 F (36.7 C) 97.9 F (36.6 C)  TempSrc:   Oral Oral  SpO2:   98% 98%  Weight:       General exam: Awake, laying in bed, in nad Respiratory system: Normal respiratory effort, no wheezing Cardiovascular system: regular rate, s1, s2 Gastrointestinal system: Soft, nondistended, positive BS Central nervous system: CN2-12 grossly intact, strength intact Extremities: Perfused, no clubbing Skin: Normal skin turgor, no notable skin lesions seen Psychiatry: Mood normal // no visual hallucinations   Data Reviewed:  Labs reviewed: Na 141, K 4.4, Cr 0.74, WBC 5.7, Hgb 11.7, Plts 106  Family Communication: Pt in room, family not at bedside  Disposition: Status is: Inpatient Remains inpatient appropriate because: severity of illness, poor pain control  Planned Discharge Destination: Home    Author: Rickey Barbara, MD 12/22/2023 4:56 PM  For on call review [email or web address].

## 2023-12-22 NOTE — ED Notes (Signed)
 Patient transported to CT

## 2023-12-23 DIAGNOSIS — J9601 Acute respiratory failure with hypoxia: Secondary | ICD-10-CM | POA: Diagnosis not present

## 2023-12-23 DIAGNOSIS — S2243XA Multiple fractures of ribs, bilateral, initial encounter for closed fracture: Secondary | ICD-10-CM | POA: Diagnosis not present

## 2023-12-23 LAB — COMPREHENSIVE METABOLIC PANEL
ALT: 9 U/L (ref 0–44)
AST: 15 U/L (ref 15–41)
Albumin: 3.1 g/dL — ABNORMAL LOW (ref 3.5–5.0)
Alkaline Phosphatase: 45 U/L (ref 38–126)
Anion gap: 9 (ref 5–15)
BUN: 21 mg/dL (ref 8–23)
CO2: 24 mmol/L (ref 22–32)
Calcium: 9 mg/dL (ref 8.9–10.3)
Chloride: 106 mmol/L (ref 98–111)
Creatinine, Ser: 0.72 mg/dL (ref 0.44–1.00)
GFR, Estimated: >60 mL/min (ref >60)
Glucose, Bld: 122 mg/dL — ABNORMAL HIGH (ref 70–99)
Potassium: 3.9 mmol/L (ref 3.5–5.1)
Sodium: 139 mmol/L (ref 135–145)
Total Bilirubin: 0.8 mg/dL (ref 0.0–1.2)
Total Protein: 6.5 g/dL (ref 6.5–8.1)

## 2023-12-23 LAB — GLUCOSE, CAPILLARY
Glucose-Capillary: 126 mg/dL — ABNORMAL HIGH (ref 70–99)
Glucose-Capillary: 140 mg/dL — ABNORMAL HIGH (ref 70–99)
Glucose-Capillary: 119 mg/dL — ABNORMAL HIGH (ref 70–99)
Glucose-Capillary: 118 mg/dL — ABNORMAL HIGH (ref 70–99)

## 2023-12-23 MED ORDER — MUPIROCIN 2 % EX OINT
1.0000 | TOPICAL_OINTMENT | Freq: Every day | CUTANEOUS | Status: DC
  Administered 2023-12-23 – 2023-12-29 (×7): 1 via TOPICAL
  Filled 2023-12-23 (×5): qty 22

## 2023-12-23 MED ORDER — MAGNESIUM SULFATE 2 GM/50ML IV SOLN
2.0000 g | Freq: Once | INTRAVENOUS | Status: AC
  Administered 2023-12-23: 2 g via INTRAVENOUS
  Filled 2023-12-23: qty 50

## 2023-12-23 MED ORDER — POTASSIUM CHLORIDE CRYS ER 20 MEQ PO TBCR
40.0000 meq | EXTENDED_RELEASE_TABLET | Freq: Once | ORAL | Status: AC
  Administered 2023-12-23: 40 meq via ORAL
  Filled 2023-12-23: qty 2

## 2023-12-23 NOTE — Progress Notes (Signed)
   12/23/23 2039  BiPAP/CPAP/SIPAP  BiPAP/CPAP/SIPAP Pt Type Adult  BiPAP/CPAP/SIPAP Resmed  Mask Type Full face mask  Mask Size Medium  Respiratory Rate 16 breaths/min  EPAP 5 cmH2O  FiO2 (%) 28 %  Flow Rate 2 lpm  Auto Titrate No  BiPAP/CPAP /SiPAP Vitals  Resp 16  Bilateral Breath Sounds Clear;Diminished  MEWS Score/Color  MEWS Score 1  MEWS Score Color Green

## 2023-12-23 NOTE — Evaluation (Signed)
 Occupational Therapy Evaluation Patient Details Name: Natasha Glass MRN: [id number] DOB: 04-27-1941 Today's Date: 12/23/2023   History of Present Illness   83 y.o. female presents to [facility] hospital on 12/21/2023 with chest pain and SOB after recent cardioversion with subsequent transient asystole and several seconds of CPR on 3/7. Chest CT notable for L 2-7 rib fxs and R 3-5 rib fxs. PMH includes HTN, DMII, OSA on CPAP, afib, HFpEF.     Clinical Impressions Pt c/o mild-moderate pain at rest at ribs/abdomen, increases to 8/10 with activity. Pt lives alone, 1-2 STE on deck, level entry through garage, PLOF mod I, uses rollator, shower chair, sleeps in lift chair, has hospital bed. Pt currently limited by pain, had difficulty getting in/out of bed as she does not use hospital bed too often. Pt able to stand at EOB min A and take side steps, no LOB, likely will do well with transfer to recliner/wheelchair as needed. Recommending HHOT follow up at this time, Pt open to but not eager to go to postacute, hopes to spend 1-2 more days in hospital and quickly improve to go home. Pt has support from daughters who can help with meals throughout the day, has help to get into home with wheelchair if needed. Pt has all DME needed to maximize safety/independence at home, will continue to follow acutely to progress as able.     If plan is discharge home, recommend the following:   A lot of help with walking and/or transfers;A lot of help with bathing/dressing/bathroom;Assistance with cooking/housework;Assist for transportation;Help with stairs or ramp for entrance     Functional Status Assessment   Patient has had a recent decline in their functional status and demonstrates the ability to make significant improvements in function in a reasonable and predictable amount of time.     Equipment Recommendations   None recommended by OT     Recommendations for Other Services          Precautions/Restrictions   Precautions Precautions: Fall Recall of Precautions/Restrictions: Intact Restrictions Weight Bearing Restrictions Per Provider Order: No     Mobility Bed Mobility Overal bed mobility: Needs Assistance Bed Mobility: Supine to Sit, Sit to Supine     Supine to sit: Max assist, HOB elevated, Used rails Sit to supine: Max assist, HOB elevated, Used rails   General bed mobility comments: max A in/out of bed, sleeps in lift chair    Transfers Overall transfer level: Needs assistance Equipment used: Rolling walker (2 wheels) Transfers: Sit to/from Stand, Bed to chair/wheelchair/BSC Sit to Stand: Min assist, +2 safety/equipment     Step pivot transfers: Min assist, +2 safety/equipment     General transfer comment: min A x2 for safety, no LOB      Balance Overall balance assessment: Mild deficits observed, not formally tested                                         ADL either performed or assessed with clinical judgement   ADL Overall ADL's : Needs assistance/impaired Eating/Feeding: Independent   Grooming: Minimal assistance;Sitting   Upper Body Bathing: Minimal assistance;With adaptive equipment;Sitting   Lower Body Bathing: Maximal assistance;Sitting/lateral leans   Upper Body Dressing : Minimal assistance;Sitting   Lower Body Dressing: Maximal assistance;Sit to/from stand   Toilet Transfer: Minimal assistance;+2 for safety/equipment;BSC/3in1;Rolling walker (2 wheels)   Toileting- Clothing Manipulation and Hygiene: Moderate assistance;Sitting/lateral lean  General ADL Comments: Pt likely close to baseline, sleeps in lift chair, min A for UB ADLs, mod-max A for LB ADLs. Pt states she is typically able to get socks on lying in bed but also states she sleeps in lift chair and does not get into the hospital bed, wears slip ons.     Vision Baseline Vision/History: 6 Macular Degeneration Ability to See in  Adequate Light: 1 Impaired Patient Visual Report: No change from baseline       Perception         Praxis         Pertinent Vitals/Pain Pain Assessment Pain Assessment: 0-10 Pain Score: 8  Pain Location: ribs, abdomen Pain Descriptors / Indicators: Aching, Shooting Pain Intervention(s): Monitored during session     Extremity/Trunk Assessment Upper Extremity Assessment Upper Extremity Assessment: RUE deficits/detail;LUE deficits/detail RUE Deficits / Details: Pt states RTC tears, not planning surgery. Limited shoudler ROM. RUE: Shoulder pain with ROM RUE Sensation: WNL RUE Coordination: decreased gross motor LUE Deficits / Details: hx of RTC surgery, B shoudler stiffness LUE: Shoulder pain with ROM LUE Sensation: WNL LUE Coordination: decreased gross motor   Lower Extremity Assessment Lower Extremity Assessment: Defer to PT evaluation       Communication Communication Communication: No apparent difficulties   Cognition Arousal: Alert Behavior During Therapy: WFL for tasks assessed/performed Cognition: No apparent impairments                               Following commands: Intact       Cueing  General Comments      Pt O2 levels drop to 86-89% during activity on room air, recovers to 93% after a few minutes on RA, 2L O2 via  improves to 95% or more   Exercises     Shoulder Instructions      Home Living Family/patient expects to be discharged to:: Private residence Living Arrangements: Alone Available Help at Discharge: Family;Friend(s) Type of Home: House Home Access: Stairs to enter;Level entry Entrance Stairs-Number of Steps: 2 stairs at front door, 1 step at deck, and level entry from garage Entrance Stairs-Rails: Left (at deck) Home Layout: One level     Bathroom Shower/Tub: Producer, television/film/video: Handicapped height     Home Equipment: Shower seat - built in;Grab bars - tub/shower;Grab bars - toilet;Rollator (4  wheels);Rolling Walker (2 wheels);BSC/3in1;Lift chair;Hospital bed;Wheelchair - manual;Hand held shower head;Adaptive equipment Adaptive Equipment: Sock aid Additional Comments: Pt has 3 children and friends, can assist throughout the day.      Prior Functioning/Environment Prior Level of Function : Independent/Modified Independent             Mobility Comments: Uses a Rollator ADLs Comments: ind    OT Problem List: Decreased strength;Decreased range of motion;Decreased activity tolerance;Pain;Obesity   OT Treatment/Interventions: [place]-care/ADL training;Therapeutic exercise;Energy conservation;DME and/or AE instruction;Therapeutic activities;Balance training;Patient/family education      OT Goals(Current goals can be found in the care plan section)   Acute Rehab OT Goals Patient Stated Goal: to return home OT Goal Formulation: With patient Time For Goal Achievement: 01/06/24 Potential to Achieve Goals: Good   OT Frequency:  Min 2X/week    Co-evaluation              AM-PAC OT "6 Clicks" Daily Activity     Outcome Measure Help from another person eating meals?: None Help from another person taking care of personal grooming?:  A Little Help from another person toileting, which includes using toliet, bedpan, or urinal?: A Lot Help from another person bathing (including washing, rinsing, drying)?: A Lot Help from another person to put on and taking off regular upper body clothing?: A Little Help from another person to put on and taking off regular lower body clothing?: A Lot 6 Click Score: 16   End of Session Equipment Utilized During Treatment: Gait belt;Rolling walker (2 wheels) Nurse Communication: Mobility status  Activity Tolerance: Patient tolerated treatment well Patient left: in bed;with call bell/phone within reach  OT Visit Diagnosis: Unsteadiness on feet (R26.81);Other abnormalities of gait and mobility (R26.89);Muscle weakness (generalized)  (M62.81);Pain Pain - part of body:  (chest, ribs, abdomen)                Time: 4098-1191 OT Time Calculation (min): 39 min Charges:  OT General Charges $OT Visit: 1 Visit OT Evaluation $OT Eval Moderate Complexity: 1 Mod OT Treatments $[place] [facility]/Home Management : 23-37 mins  Seanna Sisler, OTR/L   Alexis Goodell 12/23/2023, 11:27 AM

## 2023-12-23 NOTE — Evaluation (Signed)
 Physical Therapy Evaluation Patient Details Name: Natasha Glass MRN: [id number] DOB: July 09, 1941 Today's Date: 12/23/2023  History of Present Illness  83 y.o. female presents to [facility] hospital on 12/21/2023 with chest pain and SOB after recent cardioversion with subsequent transient asystole and several seconds of CPR on 3/7. Chest CT notable for L 2-7 rib fxs and R 3-5 rib fxs. PMH includes HTN, DMII, OSA on CPAP, afib, HFpEF.  Clinical Impression  Pt presents to PT with deficits in strength, power, endurance, gait, balance, functional mobility, activity tolerance. Pt is dyspneic with bed mobility and transfers at this time, fatiguing quickly during session. Pt requires significant physical assistance to mobilize in bed, although she typically sleeps in her lift chair and will not need to perform bed mobility at home. Due to continued reports of significant pain and dyspnea with transfers, along with infrequent caregiver support, PT recommends short term inpatient PT services at the time of discharge. PT will follow up tomorrow to see if the pt is able to progress quickly.        If plan is discharge home, recommend the following: A lot of help with walking and/or transfers;A lot of help with bathing/dressing/bathroom;Assistance with cooking/housework;Assist for transportation;Help with stairs or ramp for entrance   Can travel by private vehicle   Yes    Equipment Recommendations None recommended by PT  Recommendations for Other Services       Functional Status Assessment Patient has had a recent decline in their functional status and demonstrates the ability to make significant improvements in function in a reasonable and predictable amount of time.     Precautions / Restrictions Precautions Precautions: Fall Recall of Precautions/Restrictions: Intact Restrictions Weight Bearing Restrictions Per Provider Order: No      Mobility  Bed Mobility Overal bed mobility: Needs Assistance Bed  Mobility: Supine to Sit     Supine to sit: Max assist, HOB elevated, Used rails          Transfers Overall transfer level: Needs assistance Equipment used: Rolling walker (2 wheels) Transfers: Sit to/from Stand Sit to Stand: Min assist   Step pivot transfers: Contact guard assist       General transfer comment: cues for increased trunk flexion. dyspnea noted with transfer    Ambulation/Gait                  Stairs            Wheelchair Mobility     Tilt Bed    Modified Rankin (Stroke Patients Only)       Balance Overall balance assessment: Needs assistance Sitting-balance support: Feet supported, No upper extremity supported Sitting balance-Leahy Scale: Fair     Standing balance support: Bilateral upper extremity supported, Reliant on assistive device for balance Standing balance-Leahy Scale: Poor                               Pertinent Vitals/Pain Pain Assessment Pain Assessment: 0-10 Pain Score: 7  Pain Location: ribs Pain Descriptors / Indicators: Sore Pain Intervention(s): Limited activity within patient's tolerance    Home Living Family/patient expects to be discharged to:: Private residence Living Arrangements: Alone Available Help at Discharge: Family;Friend(s) Type of Home: House Home Access: Stairs to enter;Level entry Entrance Stairs-Rails: Left Entrance Stairs-Number of Steps: 2 stairs at front door, 1 step at deck, and level entry from garage   Home Layout: One level Home Equipment: Shower seat -  built in;Grab bars - tub/shower;Grab bars - toilet;Rollator (4 wheels);Rolling Walker (2 wheels);BSC/3in1;Lift chair;Hospital bed;Wheelchair - manual;Hand held shower head;Adaptive equipment Additional Comments: Pt has 3 children and friends    Prior Function Prior Level of Function : Independent/Modified Independent             Mobility Comments: Uses a Rollator ADLs Comments: ind     Extremity/Trunk  Assessment   Upper Extremity Assessment Upper Extremity Assessment: Generalized weakness (pain limiting UE use at times) RUE Deficits / Details: Pt states RTC tears, not planning surgery. Limited shoudler ROM. RUE: Shoulder pain with ROM RUE Sensation: WNL RUE Coordination: decreased gross motor LUE Deficits / Details: hx of RTC surgery, B shoudler stiffness LUE: Shoulder pain with ROM LUE Sensation: WNL LUE Coordination: decreased gross motor    Lower Extremity Assessment Lower Extremity Assessment: Generalized weakness    Cervical / Trunk Assessment Cervical / Trunk Assessment: Other exceptions Cervical / Trunk Exceptions: body habitus  Communication   Communication Communication: No apparent difficulties    Cognition Arousal: Alert Behavior During Therapy: WFL for tasks assessed/performed   PT - Cognitive impairments: No apparent impairments                         Following commands: Intact       Cueing Cueing Techniques: Verbal cues     General Comments General comments (skin integrity, edema, etc.): pt on 2L [place] throughout session, tachycardic into 130s. SpO2 in low 90s when monitored s/p transfer    Exercises     Assessment/Plan    PT Assessment Patient needs continued PT services  PT Problem List Decreased strength;Decreased activity tolerance;Decreased balance;Decreased mobility;Decreased knowledge of use of DME;Cardiopulmonary status limiting activity;Pain       PT Treatment Interventions DME instruction;Gait training;Stair training;Functional mobility training;Therapeutic activities;Therapeutic exercise;Balance training;Neuromuscular re-education;Patient/family education    PT Goals (Current goals can be found in the Care Plan section)  Acute Rehab PT Goals Patient Stated Goal: to reduce pain and restore independence when mobilizing PT Goal Formulation: With patient Time For Goal Achievement: 01/06/24 Potential to Achieve Goals: Fair     Frequency Min 2X/week     Co-evaluation               AM-PAC PT "6 Clicks" Mobility  Outcome Measure Help needed turning from your back to your side while in a flat bed without using bedrails?: A Lot Help needed moving from lying on your back to sitting on the side of a flat bed without using bedrails?: A Lot Help needed moving to and from a bed to a chair (including a wheelchair)?: A Little Help needed standing up from a chair using your arms (e.g., wheelchair or bedside chair)?: A Little Help needed to walk in hospital room?: Total Help needed climbing 3-5 steps with a railing? : Total 6 Click Score: 12    End of Session Equipment Utilized During Treatment: Oxygen Activity Tolerance: Patient limited by fatigue;Patient limited by pain Patient left: in chair;with call bell/phone within reach;with chair alarm set Nurse Communication: Mobility status PT Visit Diagnosis: Other abnormalities of gait and mobility (R26.89);Muscle weakness (generalized) (M62.81);Pain    Time: 1350-1420 PT Time Calculation (min) (ACUTE ONLY): 30 min   Charges:   PT Evaluation $PT Eval Low Complexity: 1 Low   PT General Charges $$ ACUTE PT VISIT: 1 Visit         Arlyss Gandy, PT, DPT Acute Rehabilitation Office [phone]  Arlyss Gandy 12/23/2023, 2:40 PM

## 2023-12-23 NOTE — Progress Notes (Signed)
 Patient admitted to the unit from the Emergency Department with a Yellow MEWS score of 2.  Patient monitored and Yellow MEWS vital sign protocol started.

## 2023-12-23 NOTE — Consult Note (Signed)
 WOC Nurse Consult Note: patient is followed at [facility] for R lower leg wound (traumatic), last visit 12/18/2023 with use of Mupirocin, silver and Kerlix and Coban  Reason for Consult: wound R lower leg  Wound type: full thickness R lower leg  Pressure Injury POA: NA  Measurement: per nursing flowsheet  Wound bed: 100% red moist  Drainage (amount, consistency, odor)  per nursing flowsheet  Periwound:  intact  Dressing procedure/placement/frequency: Cleanse R lower leg wound with Vashe wound cleanser Hart [place] [phone]), apply Mupirocin ointment to wound bed daily, cut a small piece of silver hydrofiber Hart [place] [phone]) and place in wound bed, cover with ABD pad and wrap with Kerlix roll gauze beginning just above toes and ending above wound.  May cover with Ace bandage wrapped in same fashion as Kerlix roll gauze to secure dressing and provide light compression.   Patient should follow with wound care center at discharge and continue their plan of care.   POC discussed with bedside nurse. WOC team will not follow. Re-consult if further needs arise   Thank you,    Priscella Mann MSN, RN-BC, [employer] [phone]

## 2023-12-23 NOTE — Progress Notes (Signed)
 Pharmacy: Dofetilide (Tikosyn) - Follow Up Assessment and Electrolyte Replacement  Pharmacy consulted to assist in monitoring and replacing electrolytes in this 83 y.o. female admitted on 12/21/2023 undergoing dofetilide resumption.   Labs:    Component Value Date/Time   K 3.9 12/23/2023 0641   MG 1.9 12/22/2023 0530     Plan: Potassium: K 3.8-3.9:  Give KCl 40 mEq po x1   Magnesium: Mg 1.8-2: Give Mg 2 gm IV x1      Thank you for allowing pharmacy to participate in this patient's care   Leander Rams 12/23/2023  3:42 PM

## 2023-12-23 NOTE — Progress Notes (Signed)
  Progress Note   Patient: Natasha Glass VQQ:[id number] DOB: [birth year] DOA: 12/21/2023     1 DOS: the patient was seen and examined on 12/23/2023   Brief hospital course: 83 y.o. female with medical history significant for hypertension, type 2 diabetes mellitus, BMI 46, OSA on CPAP, atrial fibrillation on Eliquis, chronic HFpEF, and electrical cardioversion on 12/19/2023 complicated by long pauses/transient asystole treated with several seconds of CPR, now presenting with chest pain and shortness of breath.   Patient had some chest discomfort initially after the procedure and brief CPR on 12/19/2023, but felt well enough to return home at the time.  Unfortunately, her chest discomfort has progressively worsened and is now severe when she takes a deep breath, coughs, or moves.  She has not been taking her Lasix due to severe chest pain when she gets up to urinate.   ED Course: Upon arrival to the ED, patient is found to be afebrile and saturating mid 80s on room air with tachypnea, tachycardia, and elevated blood pressure.  EKG demonstrates atrial fibrillation with rate 114.  CTA chest is most notable for left anterolateral rib fractures involving ribs 2 through 7, as well as ribs 3 through 5 on the right.  Assessment and Plan: 1. Rib fractures; acute hypoxic respiratory failure   - Continue pain-control and incentive spirometry, continue supplemental O2 as-needed   - This morning, still complained of 10/10 pain worse with movement. Still required IV dilaudid this AM, however believes pain may be subsiding somewhat -consult PT/OT   2. PAF with RVR  - Rate improved with pain-control in ED  - Continue Eliquis, Tikosyn, metoprolol  - Seen by Cardiology. Recs to continue Tikosyn, BB, eliquis and can d/c from Cardiology perspective once pain is better controlled -consulted pharmacy to f/u on electrolytes given Tikosyn use   3. Chronic HFpEF  - Resume Lasix    4. Type II DM  - A1c was 6.5% in August  2024   - Check CBGs and use low-intensity SSI for now    5. OSA  - CPAP while sleeping    6. Chronic right leg wound - Wound care        Subjective: Continues with marked chest pains this AM  Physical Exam: Vitals:   12/23/23 0131 12/23/23 0525 12/23/23 0930 12/23/23 1250  BP: 129/73 108/72 102/69 120/75  Pulse: 92 (!) 106 (!) 104 (!) 103  Resp: 18 18 18 18   Temp: 99 F (37.2 C) 98.9 F (37.2 C) 98.5 F (36.9 C) 98.6 F (37 C)  TempSrc: Oral Oral Oral Oral  SpO2: 95% 97% 98% 98%  Weight:       General exam: Conversant, in no acute distress Respiratory system: normal chest rise, clear, no audible wheezing Cardiovascular system: regular rhythm, s1-s2 Gastrointestinal system: Nondistended, nontender, pos BS Central nervous system: No seizures, no tremors Extremities: No cyanosis, no joint deformities Skin: No rashes, no pallor Psychiatry: Affect normal // no auditory hallucinations   Data Reviewed:  Labs reviewed: Na 139, K 3.9, Cr 0.72  Family Communication: Pt in room, family not at bedside  Disposition: Status is: Inpatient Remains inpatient appropriate because: severity of illness, poor pain control  Planned Discharge Destination: Home    Author: Rickey Barbara, MD 12/23/2023 4:25 PM  For on call review [email or web address].

## 2023-12-24 ENCOUNTER — Ambulatory Visit (HOSPITAL_BASED_OUTPATIENT_CLINIC_OR_DEPARTMENT_OTHER): Payer: Medicare Other | Admitting: Internal Medicine

## 2023-12-24 DIAGNOSIS — E1169 Type 2 diabetes mellitus with other specified complication: Secondary | ICD-10-CM | POA: Diagnosis not present

## 2023-12-24 DIAGNOSIS — S2242XA Multiple fractures of ribs, left side, initial encounter for closed fracture: Secondary | ICD-10-CM | POA: Diagnosis not present

## 2023-12-24 DIAGNOSIS — J9601 Acute respiratory failure with hypoxia: Secondary | ICD-10-CM | POA: Diagnosis not present

## 2023-12-24 DIAGNOSIS — I4819 Other persistent atrial fibrillation: Secondary | ICD-10-CM | POA: Diagnosis not present

## 2023-12-24 LAB — CBC
HCT: 35.8 % — ABNORMAL LOW (ref 36.0–46.0)
Hemoglobin: 11.2 g/dL — ABNORMAL LOW (ref 12.0–15.0)
MCH: 28.4 pg (ref 26.0–34.0)
MCHC: 31.3 g/dL (ref 30.0–36.0)
MCV: 90.6 fL (ref 80.0–100.0)
Platelets: 108 10*3/uL — ABNORMAL LOW (ref 150–400)
RBC: 3.95 MIL/uL (ref 3.87–5.11)
RDW: 16.8 % — ABNORMAL HIGH (ref 11.5–15.5)
WBC: 7 10*3/uL (ref 4.0–10.5)
nRBC: 0 % (ref 0.0–0.2)

## 2023-12-24 LAB — COMPREHENSIVE METABOLIC PANEL
ALT: 9 U/L (ref 0–44)
AST: 14 U/L — ABNORMAL LOW (ref 15–41)
Albumin: 2.8 g/dL — ABNORMAL LOW (ref 3.5–5.0)
Alkaline Phosphatase: 44 U/L (ref 38–126)
Anion gap: 7 (ref 5–15)
BUN: 30 mg/dL — ABNORMAL HIGH (ref 8–23)
CO2: 25 mmol/L (ref 22–32)
Calcium: 8.8 mg/dL — ABNORMAL LOW (ref 8.9–10.3)
Chloride: 103 mmol/L (ref 98–111)
Creatinine, Ser: 0.94 mg/dL (ref 0.44–1.00)
GFR, Estimated: >60 mL/min (ref >60)
Glucose, Bld: 117 mg/dL — ABNORMAL HIGH (ref 70–99)
Potassium: 4 mmol/L (ref 3.5–5.1)
Sodium: 135 mmol/L (ref 135–145)
Total Bilirubin: 0.7 mg/dL (ref 0.0–1.2)
Total Protein: 6.3 g/dL — ABNORMAL LOW (ref 6.5–8.1)

## 2023-12-24 LAB — GLUCOSE, CAPILLARY
Glucose-Capillary: 108 mg/dL — ABNORMAL HIGH (ref 70–99)
Glucose-Capillary: 141 mg/dL — ABNORMAL HIGH (ref 70–99)
Glucose-Capillary: 138 mg/dL — ABNORMAL HIGH (ref 70–99)
Glucose-Capillary: 147 mg/dL — ABNORMAL HIGH (ref 70–99)

## 2023-12-24 LAB — MAGNESIUM: Magnesium: 2.1 mg/dL (ref 1.7–2.4)

## 2023-12-24 MED ORDER — ACETAMINOPHEN 500 MG PO TABS
1000.0000 mg | ORAL_TABLET | Freq: Four times a day (QID) | ORAL | Status: DC
  Administered 2023-12-24 – 2023-12-29 (×13): 1000 mg via ORAL
  Filled 2023-12-24 (×20): qty 2

## 2023-12-24 MED ORDER — LIDOCAINE 5 % EX PTCH
1.0000 | MEDICATED_PATCH | Freq: Every day | CUTANEOUS | Status: DC
  Administered 2023-12-24 – 2023-12-29 (×6): 1 via TRANSDERMAL
  Filled 2023-12-24 (×6): qty 1

## 2023-12-24 MED ORDER — METHOCARBAMOL 500 MG PO TABS
500.0000 mg | ORAL_TABLET | Freq: Four times a day (QID) | ORAL | Status: DC
  Administered 2023-12-24 – 2023-12-29 (×21): 500 mg via ORAL
  Filled 2023-12-24 (×21): qty 1

## 2023-12-24 NOTE — Progress Notes (Signed)
 Physical Therapy Treatment Patient Details Name: Natasha Glass MRN: [id number] DOB: 09-08-1941 Today's Date: 12/24/2023   History of Present Illness 83 y.o. female presents to [facility] hospital on 12/21/2023 with chest pain and SOB after recent cardioversion with subsequent transient asystole and several seconds of CPR on 3/7. Chest CT notable for L 2-7 rib fxs and R 3-5 rib fxs. PMH includes HTN, DMII, OSA on CPAP, afib, HFpEF.    PT Comments  Pt with improved activity tolerance when mobilizing during this session, however does continue to appear dyspneic with limited activity. Pt requires significant physical assistance for bed mobility and benefits from verbal cues for hand placement and anterior weight shift to improve transfer quality. Pt is unable to perform ADLs without physical assistance at this time. Patient will benefit from continued inpatient follow up therapy, <3 hours/day.   If plan is discharge home, recommend the following: A lot of help with walking and/or transfers;A lot of help with bathing/dressing/bathroom;Assistance with cooking/housework;Assist for transportation;Help with stairs or ramp for entrance   Can travel by private vehicle     Yes  Equipment Recommendations  None recommended by PT    Recommendations for Other Services       Precautions / Restrictions Precautions Precautions: Fall Recall of Precautions/Restrictions: Intact Restrictions Weight Bearing Restrictions Per Provider Order: No     Mobility  Bed Mobility Overal bed mobility: Needs Assistance Bed Mobility: Supine to Sit     Supine to sit: HOB elevated, Used rails, Max assist          Transfers Overall transfer level: Needs assistance Equipment used: Rolling walker (2 wheels) Transfers: Sit to/from Stand Sit to Stand: Min assist, Contact guard assist           General transfer comment: minA from bed, CGA from commode with grab bar    Ambulation/Gait Ambulation/Gait assistance:  Contact guard assist Gait Distance (Feet): 15 Feet (15' x 2 trials) Assistive device: Rolling walker (2 wheels) Gait Pattern/deviations: Step-to pattern, Wide base of support Gait velocity: reduced Gait velocity interpretation: <1.31 ft/sec, indicative of household ambulator   General Gait Details: pt with slowed step-to gait, reduced foot clearance bilaterally   Stairs             Wheelchair Mobility     Tilt Bed    Modified Rankin (Stroke Patients Only)       Balance Overall balance assessment: Needs assistance Sitting-balance support: No upper extremity supported, Feet supported Sitting balance-Leahy Scale: Fair     Standing balance support: Bilateral upper extremity supported, Reliant on assistive device for balance Standing balance-Leahy Scale: Poor                              Communication Communication Communication: No apparent difficulties  Cognition Arousal: Alert Behavior During Therapy: WFL for tasks assessed/performed   PT - Cognitive impairments: No apparent impairments                         Following commands: Intact      Cueing Cueing Techniques: Verbal cues  Exercises      General Comments General comments (skin integrity, edema, etc.): pt on 2L [place] initially during session, PT is unable to gain a reliable SpO2 reading while mobilizing. SpO2 in low 90s on 2L [place] at end of session      Pertinent Vitals/Pain Pain Assessment Pain Assessment: Faces Faces Pain Scale:  Hurts whole lot Pain Location: ribs Pain Descriptors / Indicators: Grimacing Pain Intervention(s): Monitored during session    Home Living                          Prior Function            PT Goals (current goals can now be found in the care plan section) Acute Rehab PT Goals Patient Stated Goal: to reduce pain and restore independence when mobilizing Progress towards PT goals: Progressing toward goals    Frequency    Min  2X/week      PT Plan      Co-evaluation              AM-PAC PT "6 Clicks" Mobility   Outcome Measure  Help needed turning from your back to your side while in a flat bed without using bedrails?: A Lot Help needed moving from lying on your back to sitting on the side of a flat bed without using bedrails?: A Lot Help needed moving to and from a bed to a chair (including a wheelchair)?: A Little Help needed standing up from a chair using your arms (e.g., wheelchair or bedside chair)?: A Little Help needed to walk in hospital room?: A Lot Help needed climbing 3-5 steps with a railing? : Total 6 Click Score: 13    End of Session Equipment Utilized During Treatment: Oxygen Activity Tolerance: Patient limited by fatigue Patient left: in chair;with call bell/phone within reach;with chair alarm set Nurse Communication: Mobility status PT Visit Diagnosis: Other abnormalities of gait and mobility (R26.89);Muscle weakness (generalized) (M62.81);Pain     Time: 1010-1035 PT Time Calculation (min) (ACUTE ONLY): 25 min  Charges:    $Gait Training: 8-22 mins $Therapeutic Activity: 8-22 mins PT General Charges $$ ACUTE PT VISIT: 1 Visit                     Arlyss Gandy, PT, DPT Acute Rehabilitation Office [phone]    Arlyss Gandy 12/24/2023, 12:49 PM

## 2023-12-24 NOTE — Progress Notes (Signed)
 PROGRESS NOTE    Natasha Glass  EAV:[id number] DOB: Jun 25, 1941 DOA: 12/21/2023 PCP: [name], PA    Brief Narrative:  83 y.o. female with medical history significant for hypertension, type 2 diabetes mellitus, BMI 46, OSA on CPAP, atrial fibrillation on Eliquis, chronic HFpEF, and electrical cardioversion on 12/19/2023 complicated by long pauses/transient asystole treated with several seconds of CPR and admitted due to chest pain secondary to rib fractures from CPR.  Patient had some chest discomfort initially after the procedure and brief CPR on 12/19/2023, but felt well enough to return home at the time.  Unfortunately, her chest discomfort has progressively worsened and is now severe when she takes a deep breath, coughs, or moves.     ED Course: Upon arrival to the ED, patient is found to be afebrile and saturating mid 80s on room air with tachypnea, tachycardia, and elevated blood pressure.  EKG demonstrates atrial fibrillation with rate 114.  CTA chest is most notable for left anterolateral rib fractures involving ribs 2 through 7, as well as ribs 3 through 5 on the right. Remains in the hospital.  Poor pain control.  Subjective: Patient seen in the morning rounds.  Still has moderate to severe pain especially with mobility on the left anterior chest.  Medication adjustments as below. Assessment & Plan:   Multiple rib fractures secondary to CPR, ongoing pain and disability.  Hypoxemia: Patient currently on as needed Vicodin and Dilaudid. Add lidocaine patch Robaxin 500 mg 4 times daily scheduled Tylenol 1 g every 6 hours as scheduled As needed IV Dilaudid and oral Vicodin for moderate to severe pain. Continue to do incentive spirometry.  Continue attempt to mobility.  Paroxysmal A-fib with RVR: Rate improved.  Currently rate controlled.  Patient therapeutic on Eliquis.  On Tikosyn and metoprolol.  Electrolytes are adequate.  Chronic heart failure with preserved ejection  fraction: Type 2 diabetes: Sleep apnea on CPAP Chronic right leg wounds  Stable.   DVT prophylaxis:  apixaban (ELIQUIS) tablet 5 mg   Code Status: Full code Family Communication: None at the bedside Disposition Plan: Status is: Inpatient Remains inpatient appropriate because: Significant chest pain.     Consultants:  Cardiology  Procedures:  None  Antimicrobials:  None     Objective: Vitals:   12/23/23 2039 12/24/23 0400 12/24/23 0718 12/24/23 0727  BP:  121/71  104/73  Pulse:  (!) 102  98  Resp: 16 16  16   Temp:  97.9 F (36.6 C)  98 F (36.7 C)  TempSrc:  Oral  Oral  SpO2:  92%  99%  Weight:      Height:   5\' 3"  (1.6 m)     Intake/Output Summary (Last 24 hours) at 12/24/2023 1334 Last data filed at 12/24/2023 0917 Gross per 24 hour  Intake 443 ml  Output 250 ml  Net 193 ml   Filed Weights   12/21/23 2237  Weight: 118 kg    Examination:  General exam: Appears calm and comfortable at rest.  Pleasant interactive.  In moderate distress on deep breathing and mobility. Respiratory system: Clear to auscultation. Respiratory effort reduced.  Painful on deep breathing. Cardiovascular system: S1 & S2 heard, RRR. No pedal edema. Gastrointestinal system: Soft and nontender.  Bowel sound present. Central nervous system: Alert and oriented. No focal neurological deficits.  Anxious on exam due to pain.     Data Reviewed: I have personally reviewed following labs and imaging studies  CBC: Recent Labs  Lab 12/21/23  2248 12/22/23 0530 12/24/23 0559  WBC 6.9 5.7 7.0  HGB 12.5 11.7* 11.2*  HCT 41.7 38.7 35.8*  MCV 92.5 92.4 90.6  PLT 133* 106* 108*   Basic Metabolic Panel: Recent Labs  Lab 12/21/23 2248 12/22/23 0530 12/23/23 0641 12/24/23 0559 12/24/23 0900  NA 140 141 139 135  --   K 4.2 4.4 3.9 4.0  --   CL 105 108 106 103  --   CO2 22 25 24 25   --   GLUCOSE 125* 120* 122* 117*  --   BUN 26* 23 21 30*  --   CREATININE 0.65 0.74 0.72 0.94   --   CALCIUM 9.9 9.2 9.0 8.8*  --   MG  --  1.9  --   --  2.1   GFR: Estimated Creatinine Clearance: 57.3 mL/min (by C-G formula based on SCr of 0.94 mg/dL). Liver Function Tests: Recent Labs  Lab 12/23/23 0641 12/24/23 0559  AST 15 14*  ALT 9 9  ALKPHOS 45 44  BILITOT 0.8 0.7  PROT 6.5 6.3*  ALBUMIN 3.1* 2.8*   No results for input(s): "LIPASE", "AMYLASE" in the last 168 hours. No results for input(s): "AMMONIA" in the last 168 hours. Coagulation Profile: No results for input(s): "INR", "PROTIME" in the last 168 hours. Cardiac Enzymes: No results for input(s): "CKTOTAL", "CKMB", "CKMBINDEX", "TROPONINI" in the last 168 hours. BNP (last 3 results) No results for input(s): "PROBNP" in the last 8760 hours. HbA1C: Recent Labs    12/22/23 0944  HGBA1C 6.5*   CBG: Recent Labs  Lab 12/23/23 1252 12/23/23 1747 12/23/23 2032 12/24/23 0741 12/24/23 1151  GLUCAP 140* 119* 118* 108* 141*   Lipid Profile: No results for input(s): "CHOL", "HDL", "LDLCALC", "TRIG", "CHOLHDL", "LDLDIRECT" in the last 72 hours. Thyroid Function Tests: No results for input(s): "TSH", "T4TOTAL", "FREET4", "T3FREE", "THYROIDAB" in the last 72 hours. Anemia Panel: No results for input(s): "VITAMINB12", "FOLATE", "FERRITIN", "TIBC", "IRON", "RETICCTPCT" in the last 72 hours. Sepsis Labs: No results for input(s): "PROCALCITON", "LATICACIDVEN" in the last 168 hours.  No results found for this or any previous visit (from the past 240 hours).       Radiology Studies: No results found.      Scheduled Meds:  acetaminophen  1,000 mg Oral Q6H   apixaban  5 mg Oral BID   dofetilide  250 mcg Oral BID   furosemide  40 mg Oral Daily   insulin aspart  0-5 Units Subcutaneous QHS   insulin aspart  0-9 Units Subcutaneous TID WC   lidocaine  1 patch Transdermal Daily   losartan  25 mg Oral Daily   methocarbamol  500 mg Oral QID   metoprolol succinate  25 mg Oral QHS   mupirocin ointment  1  Application Topical Daily   rosuvastatin  5 mg Oral q AM   sodium chloride flush  3 mL Intravenous Q12H   Continuous Infusions:   LOS: 2 days    Time spent: 40 minutes    Dorcas Carrow, MD [facility]

## 2023-12-24 NOTE — Progress Notes (Signed)
 Pharmacy: Dofetilide (Tikosyn) - Follow Up Assessment and Electrolyte Replacement  Pharmacy consulted to assist in monitoring and replacing electrolytes in this 83 y.o. female admitted on 12/21/2023 undergoing dofetilide re-initiation.    Labs:    Component Value Date/Time   K 4.0 12/24/2023 0559   MG 1.9 12/22/2023 0530     Plan: Potassium: K >/= 4: No additional supplementation needed  Magnesium: Mg > 2: No additional supplementation needed    Thank you for allowing pharmacy to participate in this patient's care   Leander Rams 12/24/2023  8:43 AM

## 2023-12-24 NOTE — Progress Notes (Signed)
 Mobility Specialist Progress Note:   12/24/23 1230  Mobility  Activity  (bed exercises)  Mobility Referral Yes  Mobility visit 1 Mobility  Mobility Specialist Start Time (ACUTE ONLY) 1204  Mobility Specialist Stop Time (ACUTE ONLY) 1214  Mobility Specialist Time Calculation (min) (ACUTE ONLY) 10 min   Pt received in bed, agreeable to mobility. Stated she just got back in bed but agreeable to bed level exercises. Pt grimacing d/t rib pain during exercises. VSS on 2 L. C/o fatigue, otherwise asx throughout. Pt left in bed with call bell in reach and all needs met.   Leory Plowman  Mobility Specialist Please contact via [employer] office at [phone]

## 2023-12-24 NOTE — NC FL2 (Signed)
 [place] MEDICAID FL2 LEVEL OF CARE FORM     IDENTIFICATION  Patient Name: Natasha Glass Birthdate: 12/04/1940 Sex: female Admission Date (Current Location): 12/21/2023  [facility] and [place] Number:  Producer, television/film/video and Address:  [facility], [address], [place], [place] [address]      Provider Number: [id number]  Attending Physician Name and Address:  Dorcas Carrow, MD  Relative Name and Phone Number:       Current Level of Care: Hospital Recommended Level of Care: Skilled Nursing Facility Prior Approval Number:    Date Approved/Denied:   PASRR Number: [id number]  Discharge Plan: SNF    Current Diagnoses: Patient Active Problem List   Diagnosis Date Noted   Multiple rib fractures 12/22/2023   COVID-19 05/22/2023   Paroxysmal A-fib (HCC) 05/22/2023   Encounter for monitoring dofetilide therapy 03/04/2023   Atrial fibrillation (HCC) 05/01/2022   Atrial fibrillation with rapid ventricular response (HCC) 04/30/2022   Fall at home, initial encounter 04/30/2022   Chronic diastolic (congestive) heart failure (HCC) 04/30/2022   OSA on CPAP 04/30/2022   GERD without esophagitis 04/30/2022   Chest pain 04/30/2022   COVID-19 virus infection 04/30/2022   Chronic anticoagulation 09/10/2021   Essential hypertension 08/30/2021   Aortic atherosclerosis (HCC) 08/30/2021   Type 2 diabetes mellitus with morbid obesity (HCC) 08/30/2021   Bilateral lower extremity edema 08/30/2021   Gout 08/30/2021   Thrombocytopenia (HCC) 08/30/2021   Mixed diabetic hyperlipidemia associated with type 2 diabetes mellitus (HCC) 08/30/2021   Cellulitis 08/27/2021   Sepsis (HCC) 08/26/2021   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/2019   Hypercoagulable state due to persistent atrial fibrillation (HCC) 09/17/2019   Pain in joint of right shoulder 09/22/2018   Persistent atrial fibrillation (HCC) 08/18/2018   PAF (paroxysmal atrial fibrillation) (HCC)     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) 01/19/2018   Acute respiratory failure with hypoxia (HCC) 04/11/2016   Acute lower UTI 04/10/2016   Generalized weakness 04/10/2016   Nausea 04/10/2016   Type 2 diabetes mellitus without complication, without long-term current use of insulin (HCC) 04/10/2016   Ascending aortic aneurysm (HCC) 05/18/2014   Ulcerative colitis (HCC) 05/07/2012   Morbid obesity (HCC) 05/07/2012   Hypertension 05/07/2012   History of pulmonary embolism 05/07/2012    Orientation RESPIRATION BLADDER Height & Weight     [place], Time, Situation, Place  O2 ([place] 2L) Incontinent Weight: 260 lb 3.2 oz (118 kg) Height:  5\' 3"  (160 cm)  BEHAVIORAL SYMPTOMS/MOOD NEUROLOGICAL BOWEL NUTRITION STATUS      Incontinent Diet (See DC summary)  AMBULATORY STATUS COMMUNICATION OF NEEDS Skin   Limited Assist Verbally Skin abrasions (R pretibial wound)                       Personal Care Assistance Level of Assistance  Bathing, Feeding, Dressing Bathing Assistance: Maximum assistance Feeding assistance: Limited assistance Dressing Assistance: Maximum assistance     Functional Limitations Info  Sight, Speech, Hearing Sight Info: Adequate Hearing Info: Adequate Speech Info: Adequate    SPECIAL CARE FACTORS FREQUENCY  PT (By licensed PT), OT (By licensed OT)     PT Frequency: 5x week OT Frequency: 5x week            Contractures Contractures Info: Not present    Additional Factors Info  Code Status, Allergies, Insulin Sliding Scale Code Status Info: Full Allergies Info: Bactrim (Sulfamethoxazole-trimethoprim)  Morphine And Codeine  Oysters (Shellfish Allergy)  Amlodipine  Percocet (Oxycodone-acetaminophen)  Tdap (Tetanus-diphth-acell Pertussis)  Tetanus Toxoids  Tramadol  Lactose Intolerance (Gi)  Other  Tegretol (Carbamazepine)  Tetanus-diphtheria Toxoids Td  Topiramate  Diovan (Valsartan)  Diphtheria Toxoid   Macrodantin (Nitrofurantoin Macrocrystal)   Insulin Sliding Scale Info: See DC summary       Current Medications (12/24/2023):  This is the current hospital active medication list Current Facility-Administered Medications  Medication Dose Route Frequency Provider Last Rate Last Admin   acetaminophen (TYLENOL) tablet 1,000 mg  1,000 mg Oral Q6H Ghimire, Lyndel Safe, MD   1,000 mg at 12/24/23 1239   apixaban (ELIQUIS) tablet 5 mg  5 mg Oral BID Opyd, Lavone Neri, MD   5 mg at 12/24/23 1610   bisacodyl (DULCOLAX) EC tablet 5 mg  5 mg Oral Daily PRN Opyd, Lavone Neri, MD       dofetilide (TIKOSYN) capsule 250 mcg  250 mcg Oral BID Briscoe Deutscher, MD   250 mcg at 12/24/23 0844   furosemide (LASIX) tablet 40 mg  40 mg Oral Daily Opyd, Lavone Neri, MD   40 mg at 12/24/23 0836   HYDROcodone-acetaminophen (NORCO/VICODIN) 5-325 MG per tablet 1 tablet  1 tablet Oral Q4H PRN Briscoe Deutscher, MD   1 tablet at 12/24/23 0926   HYDROmorphone (DILAUDID) injection 0.5 mg  0.5 mg Intravenous Q3H PRN Jerald Kief, MD   0.5 mg at 12/24/23 0449   insulin aspart (novoLOG) injection 0-5 Units  0-5 Units Subcutaneous QHS Jerald Kief, MD       insulin aspart (novoLOG) injection 0-9 Units  0-9 Units Subcutaneous TID WC Jerald Kief, MD   1 Units at 12/24/23 1240   lidocaine (LIDODERM) 5 % 1 patch  1 patch Transdermal Daily Dorcas Carrow, MD   1 patch at 12/24/23 1051   losartan (COZAAR) tablet 25 mg  25 mg Oral Daily Opyd, Lavone Neri, MD   25 mg at 12/24/23 9604   methocarbamol (ROBAXIN) tablet 500 mg  500 mg Oral QID Dorcas Carrow, MD   500 mg at 12/24/23 1050   metoprolol succinate (TOPROL-XL) 24 hr tablet 25 mg  25 mg Oral QHS Opyd, Lavone Neri, MD   25 mg at 12/23/23 2023   mupirocin ointment (BACTROBAN) 2 % 1 Application  1 Application Topical Daily Jerald Kief, MD   1 Application at 12/24/23 0845   polyethylene glycol (MIRALAX / GLYCOLAX) packet 17 g  17 g Oral Daily PRN Opyd, Lavone Neri, MD       rosuvastatin  (CRESTOR) tablet 5 mg  5 mg Oral q AM Opyd, Lavone Neri, MD   5 mg at 12/24/23 0837   sodium chloride flush (NS) 0.9 % injection 3 mL  3 mL Intravenous Q12H Opyd, Lavone Neri, MD   3 mL at 12/24/23 0849     Discharge Medications: Please see discharge summary for a list of discharge medications.  Relevant Imaging Results:  Relevant Lab Results:   Additional Information SS# [id number]  Carley Hammed, LCSW

## 2023-12-24 NOTE — TOC Initial Note (Signed)
 Transition of Care [facility]) - Initial/Assessment Note    Patient Details  Name: Natasha Glass MRN: [id number] Date of Birth: [birth year]  Transition of Care [facility]) CM/SW Contact:    Carley Hammed, LCSW Phone Number: 12/24/2023, 1:57 PM  Clinical Narrative:                 CSW met with pt at bedside to discuss SNF rec. Pt agreeable and states she was at [facility] in 2022 and would be agreeable to returning. Pt said no [facility] or [facility], agreeable to a fax out. Pt notes her Son Merlyn Albert is going to be her HCPOA, but she will update him as needed. Oncoming TOC to provide bed offers when available. TOC will continue to follow.   Expected Discharge Plan: Skilled Nursing Facility Barriers to Discharge: Continued Medical Work up, English as a second language teacher, SNF Pending bed offer   Patient Goals and CMS Choice Patient states their goals for this hospitalization and ongoing recovery are:: Pt notes she would like for her pain to be controlled. CMS Medicare.gov Compare Post Acute Care list provided to:: Patient Choice offered to / list presented to : Patient      Expected Discharge Plan and Services In-house Referral: Clinical Social Work   Post Acute Care Choice: Skilled Nursing Facility Living arrangements for the past 2 months: Single Family Home                                      Prior Living Arrangements/Services Living arrangements for the past 2 months: Single Family Home Lives with:: S[place] Patient language and need for interpreter reviewed:: Yes Do you feel safe going back to the place where you live?: Yes      Need for Family Participation in Patient Care: Yes (Comment) Care giver support system in place?: Yes (comment)   Criminal Activity/Legal Involvement Pertinent to Current Situation/Hospitalization: No - Comment as needed  Activities of Daily Living   ADL Screening (condition at time of admission) Independently performs ADLs?: Yes (appropriate for developmental  age) Is the patient deaf or have difficulty hearing?: No Does the patient have difficulty seeing, even when wearing glasses/contacts?: No Does the patient have difficulty concentrating, remembering, or making decisions?: No  Permission Sought/Granted Permission sought to share information with : Family Supports Permission granted to share information with : Yes, Verbal Permission Granted  Share Information with NAME: Merlyn Albert     Permission granted to share info w Relationship: Son     Emotional Assessment Appearance:: Appears stated age Attitude/Demeanor/Rapport: Engaged Affect (typically observed): Appropriate Orientation: : Oriented to [place], Oriented to Place, Oriented to  Time, Oriented to Situation Alcohol / Substance Use: Not Applicable Psych Involvement: No (comment)  Admission diagnosis:  Multiple rib fractures [S22.49XA] Closed fracture of multiple ribs of both sides, initial encounter [S22.43XA] Acute hypoxic respiratory failure (HCC) [J96.01] Patient Active Problem List   Diagnosis Date Noted   Multiple rib fractures 12/22/2023   COVID-19 05/22/2023   Paroxysmal A-fib (HCC) 05/22/2023   Encounter for monitoring dofetilide therapy 03/04/2023   Atrial fibrillation (HCC) 05/01/2022   Atrial fibrillation with rapid ventricular response (HCC) 04/30/2022   Fall at home, initial encounter 04/30/2022   Chronic diastolic (congestive) heart failure (HCC) 04/30/2022   OSA on CPAP 04/30/2022   GERD without esophagitis 04/30/2022   Chest pain 04/30/2022   COVID-19 virus infection 04/30/2022   Chronic anticoagulation 09/10/2021  Essential hypertension 08/30/2021   Aortic atherosclerosis (HCC) 08/30/2021   Type 2 diabetes mellitus with morbid obesity (HCC) 08/30/2021   Bilateral lower extremity edema 08/30/2021   Gout 08/30/2021   Thrombocytopenia (HCC) 08/30/2021   Mixed diabetic hyperlipidemia associated with type 2 diabetes mellitus (HCC) 08/30/2021   Cellulitis 08/27/2021    Sepsis (HCC) 08/26/2021   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/2019   Hypercoagulable state due to persistent atrial fibrillation (HCC) 09/17/2019   Pain in joint of right shoulder 09/22/2018   Persistent atrial fibrillation (HCC) 08/18/2018   PAF (paroxysmal atrial fibrillation) (HCC)    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 [place] [address] Phone: [phone] Fax: [phone]  [facility]) - [place], [place] - [address] [facility] [address] [place] [address] [place] [address] Phone: [phone] Fax: [phone]     Social Drivers of Health (SDOH) Social History: SDOH Screenings   Food Insecurity: No Food Insecurity (12/22/2023)  Housing: High Risk (12/22/2023)  Transportation Needs: No Transportation Needs (12/22/2023)  Utilities: Not At Risk (12/22/2023)  Social Connections: Moderately Integrated (12/22/2023)  Tobacco Use: Low Risk  (12/22/2023)   SDOH Interventions:     Readmission Risk Interventions    08/30/2021   12:37 PM  Readmission Risk Prevention Plan  Medication Screening Complete  Transportation Screening Complete

## 2023-12-25 DIAGNOSIS — S2242XA Multiple fractures of ribs, left side, initial encounter for closed fracture: Secondary | ICD-10-CM | POA: Diagnosis not present

## 2023-12-25 DIAGNOSIS — J9601 Acute respiratory failure with hypoxia: Secondary | ICD-10-CM | POA: Diagnosis not present

## 2023-12-25 DIAGNOSIS — I4819 Other persistent atrial fibrillation: Secondary | ICD-10-CM | POA: Diagnosis not present

## 2023-12-25 DIAGNOSIS — E1169 Type 2 diabetes mellitus with other specified complication: Secondary | ICD-10-CM | POA: Diagnosis not present

## 2023-12-25 LAB — GLUCOSE, CAPILLARY
Glucose-Capillary: 107 mg/dL — ABNORMAL HIGH (ref 70–99)
Glucose-Capillary: 113 mg/dL — ABNORMAL HIGH (ref 70–99)
Glucose-Capillary: 143 mg/dL — ABNORMAL HIGH (ref 70–99)
Glucose-Capillary: 194 mg/dL — ABNORMAL HIGH (ref 70–99)

## 2023-12-25 NOTE — Plan of Care (Signed)
  Problem: Coping: Goal: Ability to adjust to condition or change in health will improve Outcome: Progressing   Problem: Skin Integrity: Goal: Risk for impaired skin integrity will decrease Outcome: Progressing   Problem: Clinical Measurements: Goal: Will remain free from infection Outcome: Progressing Goal: Respiratory complications will improve Outcome: Progressing   Problem: Pain Managment: Goal: General experience of comfort will improve and/or be controlled Outcome: Progressing

## 2023-12-25 NOTE — Progress Notes (Addendum)
 Mobility Specialist Progress Note:   12/25/23 1238  Mobility  Activity Transferred from chair to bed  Level of Assistance Minimal assist, patient does 75% or more (+2)  Assistive Device Front wheel walker  Distance Ambulated (ft) 5 ft  Activity Response Tolerated well  Mobility Referral Yes  Mobility visit 1 Mobility  Mobility Specialist Start Time (ACUTE ONLY) 1105  Mobility Specialist Stop Time (ACUTE ONLY) 1125  Mobility Specialist Time Calculation (min) (ACUTE ONLY) 20 min   Pt received in chair, requesting assistance back to bed. NT present to give pt bath. MinA +2 to stand. Pt c/o rib pain during session, otherwise asx throughout. VSS on 2 L. Pt was able to ambulate with only MinG assist. Pt left in bed with call bell in reach and all needs met. Bed alarm on.   Leory Plowman  Mobility Specialist Please contact via SecureChat Rehab office at [phone]

## 2023-12-25 NOTE — Care Management Important Message (Signed)
 Important Message  Patient Details  Name: Natasha Glass MRN: [id number] Date of Birth: 10/22/1940   Important Message Given:  Yes - Medicare IM     Sherilyn Banker 12/25/2023, 11:49 AM

## 2023-12-25 NOTE — Progress Notes (Signed)
 Gave pt NORCO at [phone]. Let pt know that it is due again at 12:17p if needed. Pt verbalized understanding.

## 2023-12-25 NOTE — TOC Progression Note (Addendum)
 Transition of Care [facility]) - Progression Note    Patient Details  Name: Natasha Glass MRN: [id number] Date of Birth: April 23, 1941  [facility] CM/SW Contact  Delilah Shan, LCSWA Phone Number: 12/25/2023, 5:00 PM  Clinical Narrative:     CSW provided patient with SNF bed offers. Patient requested if CSW could see if [facility] able to offer SNF bed . CSW informed patient that CSW will reach out to see if they can offer SNF bed for patient. Patient is going to review additional SNF bed offers offers and CSW will follow up once able to see if [facility] can offer SNF bed for patient. CSW spoke with Belenda Cruise at [facility] who informed CSW that she will look at referral to see if able to offer SNF bed for patient. CSW awaiting call back from [place] with [facility]. CSW started insurance authorization for patient Berkley Harvey ID# [id number]. Patient informed CSW that she can have family bring her cpap from home to facility. CSW will continue to follow.  Expected Discharge Plan: Skilled Nursing Facility Barriers to Discharge: Continued Medical Work up, English as a second language teacher, SNF Pending bed offer  Expected Discharge Plan and Services In-house Referral: Clinical Social Work   Post Acute Care Choice: Skilled Nursing Facility Living arrangements for the past 2 months: Single Family Home                                       Social Determinants of Health (SDOH) Interventions SDOH Screenings   Food Insecurity: No Food Insecurity (12/22/2023)  Housing: High Risk (12/22/2023)  Transportation Needs: No Transportation Needs (12/22/2023)  Utilities: Not At Risk (12/22/2023)  Social Connections: Moderately Integrated (12/22/2023)  Tobacco Use: Low Risk  (12/22/2023)    Readmission Risk Interventions    08/30/2021   12:37 PM  Readmission Risk Prevention Plan  Medication Screening Complete  Transportation Screening Complete

## 2023-12-25 NOTE — Progress Notes (Signed)
 Gave pt NORCO at 1233. Let pt know that it is due again at 4:33p if needed. Pt verbalized understanding.

## 2023-12-25 NOTE — Progress Notes (Signed)
 PROGRESS NOTE    Natasha Glass  ZOX:[id number] DOB: September 10, 1941 DOA: 12/21/2023 PCP: [name], PA    Brief Narrative:  84 y.o. female with medical history significant for hypertension, type 2 diabetes mellitus, BMI 46, OSA on CPAP, atrial fibrillation on Eliquis, chronic HFpEF, and electrical cardioversion on 12/19/2023 complicated by long pauses/transient asystole treated with several seconds of CPR and admitted due to chest pain secondary to rib fractures from CPR.  Patient had some chest discomfort initially after the procedure and brief CPR on 12/19/2023, but felt well enough to return home at the time.  Unfortunately, her chest discomfort has progressively worsened and is now severe when she takes a deep breath, coughs, or moves.     ED Course: Upon arrival to the ED, patient is found to be afebrile and saturating mid 80s on room air with tachypnea, tachycardia, and elevated blood pressure.  EKG demonstrates atrial fibrillation with rate 114.  CTA chest is most notable for left anterolateral rib fractures involving ribs 2 through 7, as well as ribs 3 through 5 on the right. Remains in the hospital.  Poor pain control.  Subjective: Patient seen and examined.  Still painful on the left anterior chest wall but slightly better than yesterday.  Agreeable to skilled nursing facility placement.   Assessment & Plan:   Multiple rib fractures secondary to CPR, ongoing pain and disability.  Hypoxemia: Patient currently on as needed Vicodin and Dilaudid. Lidocaine patch Robaxin 500 mg 4 times daily scheduled Tylenol 1 g every 6 hours as scheduled As needed IV Dilaudid and oral Vicodin for moderate to severe pain. Continue to do incentive spirometry.  Continue attempt to mobility.  Paroxysmal A-fib with RVR: Rate improved.  Currently rate controlled.  Patient therapeutic on Eliquis.  On Tikosyn and metoprolol.  Electrolytes are adequate.  Chronic heart failure with preserved ejection  fraction: Type 2 diabetes: Sleep apnea on CPAP Chronic right leg wounds  Stable.   DVT prophylaxis:  apixaban (ELIQUIS) tablet 5 mg   Code Status: Full code Family Communication: None at the bedside Disposition Plan: Status is: Inpatient Remains inpatient appropriate because: Stabilizing.  Transfer to SNF when bed available.     Consultants:  Cardiology  Procedures:  None  Antimicrobials:  None     Objective: Vitals:   12/24/23 1556 12/24/23 1900 12/25/23 0620 12/25/23 0755  BP: 103/60 108/69 (!) 130/90 111/66  Pulse: (!) 106 (!) 107 (!) 103 (!) 109  Resp: 16 18 18 18   Temp: 97.7 F (36.5 C) 97.9 F (36.6 C) 97.8 F (36.6 C)   TempSrc: Oral Oral Oral   SpO2: 97% 98% 96% 97%  Weight:      Height:        Intake/Output Summary (Last 24 hours) at 12/25/2023 1109 Last data filed at 12/25/2023 0756 Gross per 24 hour  Intake 240 ml  Output 500 ml  Net -260 ml   Filed Weights   12/21/23 2237  Weight: 118 kg    Examination:  General exam: Calm and comfortable at rest.  Slightly anxious and appropriately uncomfortable on mobility. Respiratory system: Clear to auscultation. Respiratory effort reduced.  Painful on deep breathing. Cardiovascular system: S1 & S2 heard, RRR. No pedal edema. Gastrointestinal system: Soft and nontender.  Bowel sound present. Central nervous system: Alert and oriented. No focal neurological deficits.  Right lower extremity stasis ulcer on Unna boot.     Data Reviewed: I have personally reviewed following labs and imaging studies  CBC: Recent Labs  Lab 12/21/23 2248 12/22/23 0530 12/24/23 0559  WBC 6.9 5.7 7.0  HGB 12.5 11.7* 11.2*  HCT 41.7 38.7 35.8*  MCV 92.5 92.4 90.6  PLT 133* 106* 108*   Basic Metabolic Panel: Recent Labs  Lab 12/21/23 2248 12/22/23 0530 12/23/23 0641 12/24/23 0559 12/24/23 0900  NA 140 141 139 135  --   K 4.2 4.4 3.9 4.0  --   CL 105 108 106 103  --   CO2 22 25 24 25   --   GLUCOSE 125*  120* 122* 117*  --   BUN 26* 23 21 30*  --   CREATININE 0.65 0.74 0.72 0.94  --   CALCIUM 9.9 9.2 9.0 8.8*  --   MG  --  1.9  --   --  2.1   GFR: Estimated Creatinine Clearance: 57.3 mL/min (by C-G formula based on SCr of 0.94 mg/dL). Liver Function Tests: Recent Labs  Lab 12/23/23 0641 12/24/23 0559  AST 15 14*  ALT 9 9  ALKPHOS 45 44  BILITOT 0.8 0.7  PROT 6.5 6.3*  ALBUMIN 3.1* 2.8*   No results for input(s): "LIPASE", "AMYLASE" in the last 168 hours. No results for input(s): "AMMONIA" in the last 168 hours. Coagulation Profile: No results for input(s): "INR", "PROTIME" in the last 168 hours. Cardiac Enzymes: No results for input(s): "CKTOTAL", "CKMB", "CKMBINDEX", "TROPONINI" in the last 168 hours. BNP (last 3 results) No results for input(s): "PROBNP" in the last 8760 hours. HbA1C: No results for input(s): "HGBA1C" in the last 72 hours.  CBG: Recent Labs  Lab 12/24/23 0741 12/24/23 1151 12/24/23 1641 12/24/23 2050 12/25/23 0749  GLUCAP 108* 141* 138* 147* 107*   Lipid Profile: No results for input(s): "CHOL", "HDL", "LDLCALC", "TRIG", "CHOLHDL", "LDLDIRECT" in the last 72 hours. Thyroid Function Tests: No results for input(s): "TSH", "T4TOTAL", "FREET4", "T3FREE", "THYROIDAB" in the last 72 hours. Anemia Panel: No results for input(s): "VITAMINB12", "FOLATE", "FERRITIN", "TIBC", "IRON", "RETICCTPCT" in the last 72 hours. Sepsis Labs: No results for input(s): "PROCALCITON", "LATICACIDVEN" in the last 168 hours.  No results found for this or any previous visit (from the past 240 hours).       Radiology Studies: No results found.      Scheduled Meds:  acetaminophen  1,000 mg Oral Q6H   apixaban  5 mg Oral BID   dofetilide  250 mcg Oral BID   furosemide  40 mg Oral Daily   insulin aspart  0-5 Units Subcutaneous QHS   insulin aspart  0-9 Units Subcutaneous TID WC   lidocaine  1 patch Transdermal Daily   losartan  25 mg Oral Daily   methocarbamol   500 mg Oral QID   metoprolol succinate  25 mg Oral QHS   mupirocin ointment  1 Application Topical Daily   rosuvastatin  5 mg Oral q AM   sodium chloride flush  3 mL Intravenous Q12H   Continuous Infusions:   LOS: 3 days    Time spent: 40 minutes    Dorcas Carrow, MD [facility]

## 2023-12-25 NOTE — Progress Notes (Signed)
 Occupational Therapy Treatment Patient Details Name: Natasha Glass MRN: [id number] DOB: 1941-03-05 Today's Date: 12/25/2023   History of present illness 83 y.o. female presents to [facility] hospital on 12/21/2023 with chest pain and SOB after recent cardioversion with subsequent transient asystole and several seconds of CPR on 3/7. Chest CT notable for L 2-7 rib fxs and R 3-5 rib fxs. PMH includes HTN, DMII, OSA on CPAP, afib, HFpEF.   OT comments  Pt progressing well towards goals. Required increased time and rest breaks d/t pain and O2 levels. Heavy cues for breathing techniques to maintain O2 levels. Pt continues to be limited by pain and impaired balance. Recommendation of <3 hours of skilled therapy to optimize functional independence levels. OT will continue to follow acutely to maximize independence.      If plan is discharge home, recommend the following:  A little help with walking and/or transfers;A lot of help with bathing/dressing/bathroom;Assistance with cooking/housework;Assist for transportation   Equipment Recommendations  None recommended by OT    Recommendations for Other Services      Precautions / Restrictions Precautions Precautions: Fall Recall of Precautions/Restrictions: Intact Restrictions Weight Bearing Restrictions Per Provider Order: No       Mobility Bed Mobility Overal bed mobility: Needs Assistance Bed Mobility: Supine to Sit     Supine to sit: HOB elevated, Used rails, Mod assist          Transfers Overall transfer level: Needs assistance Equipment used: Rolling walker (2 wheels) Transfers: Sit to/from Stand Sit to Stand: Min assist     Step pivot transfers: Contact guard assist     General transfer comment: minA from bed, CGA from [facility]     Balance Overall balance assessment: Needs assistance Sitting-balance support: Bilateral upper extremity supported, Feet supported Sitting balance-Leahy Scale: Fair     Standing balance support:  Bilateral upper extremity supported, Reliant on assistive device for balance Standing balance-Leahy Scale: Poor                             ADL either performed or assessed with clinical judgement   ADL Overall ADL's : Needs assistance/impaired     Grooming: Wash/dry face;Set up;Sitting           Upper Body Dressing : Minimal assistance;Sitting Upper Body Dressing Details (indicate cue type and reason): Assist to reach gown behind back d/t limited shoulder ROM Lower Body Dressing: Maximal assistance;Sit to/from stand   Toilet Transfer: Minimal assistance;Stand-pivot;BSC/3in1   Toileting- Clothing Manipulation and Hygiene: Maximal assistance;Sit to/from stand Toileting - Clothing Manipulation Details (indicate cue type and reason): Pt verbalized fear of letting go of RW to wipe     Functional mobility during ADLs: Contact guard assist;Rolling walker (2 wheels) (Short distances)      Extremity/Trunk Assessment Upper Extremity Assessment Upper Extremity Assessment: Generalized weakness RUE Deficits / Details: Pt states RTC tears, not planning surgery. Limited shoudler ROM. RUE: Shoulder pain with ROM RUE Sensation: WNL RUE Coordination: decreased gross motor LUE Deficits / Details: hx of RTC surgery, B shoudler stiffness LUE: Shoulder pain with ROM LUE Sensation: WNL LUE Coordination: decreased gross motor   Lower Extremity Assessment Lower Extremity Assessment: Defer to PT evaluation        Vision   Vision Assessment?: No apparent visual deficits   Perception     Praxis     Communication Communication Communication: No apparent difficulties   Cognition Arousal: Alert Behavior During Therapy: [place] for  tasks assessed/performed Cognition: No apparent impairments                               Following commands: Intact        Cueing   Cueing Techniques: Verbal cues  Exercises      Shoulder Instructions       General Comments  Pt on 2L [place].  With mobility O2 dropped to 89, HR dropped to 56, with rest break O2 returned to 94 HR to 110    Pertinent Vitals/ Pain       Pain Assessment Pain Assessment: Faces Faces Pain Scale: Hurts whole lot Pain Location: ribs Pain Descriptors / Indicators: Grimacing Pain Intervention(s): Monitored during session, Premedicated before session, Repositioned  Home Living                                          Prior Functioning/Environment              Frequency  Min 2X/week        Progress Toward Goals  OT Goals(current goals can now be found in the care plan section)     Acute Rehab OT Goals Patient Stated Goal: To be in less pain OT Goal Formulation: With patient Time For Goal Achievement: 01/06/24 Potential to Achieve Goals: Good ADL Goals Pt Will Perform Upper Body Dressing: with supervision;with set-up Pt Will Perform Lower Body Dressing: with min assist Pt Will Transfer to Toilet: with supervision;ambulating Pt Will Perform Toileting - Clothing Manipulation and hygiene: with set-up;with supervision  Plan      Co-evaluation                 AM-PAC OT "6 Clicks" Daily Activity     Outcome Measure   Help from another person eating meals?: None Help from another person taking care of personal grooming?: A Little Help from another person toileting, which includes using toliet, bedpan, or urinal?: A Lot Help from another person bathing (including washing, rinsing, drying)?: A Lot Help from another person to put on and taking off regular upper body clothing?: A Little Help from another person to put on and taking off regular lower body clothing?: A Lot 6 Click Score: 16    End of Session Equipment Utilized During Treatment: Gait belt;Rolling walker (2 wheels)  OT Visit Diagnosis: Unsteadiness on feet (R26.81);Other abnormalities of gait and mobility (R26.89);Muscle weakness (generalized) (M62.81);Pain Pain - part of body:   (Ribs)   Activity Tolerance Patient tolerated treatment well   Patient Left in chair;with call bell/phone within reach;with chair alarm set   Nurse Communication Mobility status        Time: 1610-9604 OT Time Calculation (min): 32 min  Charges: OT General Charges $OT Visit: 1 Visit OT Treatments $[place] Care/Home Management : 23-37 mins  Ivor Messier, OT  Acute Rehabilitation Services Office [phone] Secure chat preferred   Marilynne Drivers 12/25/2023, 10:30 AM

## 2023-12-26 DIAGNOSIS — E1169 Type 2 diabetes mellitus with other specified complication: Secondary | ICD-10-CM | POA: Diagnosis not present

## 2023-12-26 DIAGNOSIS — S2242XA Multiple fractures of ribs, left side, initial encounter for closed fracture: Secondary | ICD-10-CM | POA: Diagnosis not present

## 2023-12-26 DIAGNOSIS — J9601 Acute respiratory failure with hypoxia: Secondary | ICD-10-CM | POA: Diagnosis not present

## 2023-12-26 DIAGNOSIS — I4819 Other persistent atrial fibrillation: Secondary | ICD-10-CM | POA: Diagnosis not present

## 2023-12-26 LAB — GLUCOSE, CAPILLARY
Glucose-Capillary: 159 mg/dL — ABNORMAL HIGH (ref 70–99)
Glucose-Capillary: 93 mg/dL (ref 70–99)
Glucose-Capillary: 134 mg/dL — ABNORMAL HIGH (ref 70–99)
Glucose-Capillary: 143 mg/dL — ABNORMAL HIGH (ref 70–99)

## 2023-12-26 MED ORDER — METFORMIN HCL 500 MG PO TABS
500.0000 mg | ORAL_TABLET | Freq: Two times a day (BID) | ORAL | Status: DC
Start: 2023-12-26 — End: 2023-12-29
  Administered 2023-12-26 – 2023-12-29 (×6): 500 mg via ORAL
  Filled 2023-12-26 (×6): qty 1

## 2023-12-26 MED ORDER — HYDROCODONE-ACETAMINOPHEN 5-325 MG PO TABS: 1.0000 | ORAL_TABLET | ORAL | 0 refills | Status: AC | PRN

## 2023-12-26 NOTE — Progress Notes (Signed)
 PROGRESS NOTE    Natasha Glass  ZOX:[id number] DOB: 1940/11/06 DOA: 12/21/2023 PCP: [name], PA    Brief Narrative:  83 y.o. female with medical history significant for hypertension, type 2 diabetes mellitus, BMI 46, OSA on CPAP, atrial fibrillation on Eliquis, chronic HFpEF, and electrical cardioversion on 12/19/2023 complicated by long pauses/transient asystole treated with several seconds of CPR and admitted due to chest pain secondary to rib fractures from CPR.  Patient had some chest discomfort initially after the procedure and brief CPR on 12/19/2023, but felt well enough to return home at the time.  Unfortunately, her chest discomfort has progressively worsened and is now severe when she takes a deep breath, coughs, or moves.     ED Course: Upon arrival to the ED, patient is found to be afebrile and saturating mid 80s on room air with tachypnea, tachycardia, and elevated blood pressure.  EKG demonstrates atrial fibrillation with rate 114.  CTA chest is most notable for left anterolateral rib fractures involving ribs 2 through 7, as well as ribs 3 through 5 on the right. Remains in the hospital.  Poor pain control.  Subjective:  Patient seen and examined.  Still painful, however manageable with oral pain medication and using IV pain medication only once a day.   Assessment & Plan:   Multiple rib fractures secondary to CPR, ongoing pain and disability.  Hypoxemia: Patient currently on as needed Vicodin and Dilaudid. Lidocaine patch Robaxin 500 mg 4 times daily scheduled Tylenol 1 g every 6 hours as scheduled As needed IV Dilaudid and oral Vicodin for moderate to severe pain. Continue to do incentive spirometry.  Continue attempt to mobility.  Referred to SNF for rehab.  Paroxysmal A-fib with RVR: Rate improved.  Currently rate controlled.  Patient therapeutic on Eliquis.  On Tikosyn and metoprolol.  Electrolytes are adequate.  Chronic heart failure with preserved ejection  fraction: Type 2 diabetes: Can go back on metformin. Sleep apnea on CPAP Chronic right leg wounds  Stable.  Transfer to SNF when bed available.   DVT prophylaxis:  apixaban (ELIQUIS) tablet 5 mg   Code Status: Full code Family Communication: None at the bedside Disposition Plan: Status is: Inpatient Remains inpatient appropriate because: Stabilizing.  Transfer to SNF when bed available.     Consultants:  Cardiology  Procedures:  None  Antimicrobials:  None     Objective: Vitals:   12/25/23 2005 12/25/23 2232 12/26/23 0353 12/26/23 0803  BP: 126/85  114/66 109/68  Pulse:   93 93  Resp: 19  18 18   Temp: 97.8 F (36.6 C)  97.8 F (36.6 C) 97.6 F (36.4 C)  TempSrc:    Oral  SpO2: 97% 98% 95% 98%  Weight:      Height:        Intake/Output Summary (Last 24 hours) at 12/26/2023 1129 Last data filed at 12/26/2023 1058 Gross per 24 hour  Intake 240 ml  Output 350 ml  Net -110 ml   Filed Weights   12/21/23 2237  Weight: 118 kg    Examination:  General exam: Calm and comfortable at rest.   Respiratory system: Clear to auscultation. Respiratory effort reduced.  No palpable tenderness. Cardiovascular system: S1 & S2 heard, RRR. No pedal edema. Gastrointestinal system: Soft and nontender.  Bowel sound present. Central nervous system: Alert and oriented. No focal neurological deficits.  Right lower extremity stasis ulcer on Unna boot.     Data Reviewed: I have personally reviewed following labs  and imaging studies  CBC: Recent Labs  Lab 12/21/23 2248 12/22/23 0530 12/24/23 0559  WBC 6.9 5.7 7.0  HGB 12.5 11.7* 11.2*  HCT 41.7 38.7 35.8*  MCV 92.5 92.4 90.6  PLT 133* 106* 108*   Basic Metabolic Panel: Recent Labs  Lab 12/21/23 2248 12/22/23 0530 12/23/23 0641 12/24/23 0559 12/24/23 0900  NA 140 141 139 135  --   K 4.2 4.4 3.9 4.0  --   CL 105 108 106 103  --   CO2 22 25 24 25   --   GLUCOSE 125* 120* 122* 117*  --   BUN 26* 23 21 30*  --    CREATININE 0.65 0.74 0.72 0.94  --   CALCIUM 9.9 9.2 9.0 8.8*  --   MG  --  1.9  --   --  2.1   GFR: Estimated Creatinine Clearance: 57.3 mL/min (by C-G formula based on SCr of 0.94 mg/dL). Liver Function Tests: Recent Labs  Lab 12/23/23 0641 12/24/23 0559  AST 15 14*  ALT 9 9  ALKPHOS 45 44  BILITOT 0.8 0.7  PROT 6.5 6.3*  ALBUMIN 3.1* 2.8*   No results for input(s): "LIPASE", "AMYLASE" in the last 168 hours. No results for input(s): "AMMONIA" in the last 168 hours. Coagulation Profile: No results for input(s): "INR", "PROTIME" in the last 168 hours. Cardiac Enzymes: No results for input(s): "CKTOTAL", "CKMB", "CKMBINDEX", "TROPONINI" in the last 168 hours. BNP (last 3 results) No results for input(s): "PROBNP" in the last 8760 hours. HbA1C: No results for input(s): "HGBA1C" in the last 72 hours.  CBG: Recent Labs  Lab 12/25/23 0749 12/25/23 1216 12/25/23 1745 12/25/23 2008 12/26/23 0800  GLUCAP 107* 113* 143* 194* 159*   Lipid Profile: No results for input(s): "CHOL", "HDL", "LDLCALC", "TRIG", "CHOLHDL", "LDLDIRECT" in the last 72 hours. Thyroid Function Tests: No results for input(s): "TSH", "T4TOTAL", "FREET4", "T3FREE", "THYROIDAB" in the last 72 hours. Anemia Panel: No results for input(s): "VITAMINB12", "FOLATE", "FERRITIN", "TIBC", "IRON", "RETICCTPCT" in the last 72 hours. Sepsis Labs: No results for input(s): "PROCALCITON", "LATICACIDVEN" in the last 168 hours.  No results found for this or any previous visit (from the past 240 hours).       Radiology Studies: No results found.      Scheduled Meds:  acetaminophen  1,000 mg Oral Q6H   apixaban  5 mg Oral BID   dofetilide  250 mcg Oral BID   furosemide  40 mg Oral Daily   lidocaine  1 patch Transdermal Daily   losartan  25 mg Oral Daily   metFORMIN  500 mg Oral BID WC   methocarbamol  500 mg Oral QID   metoprolol succinate  25 mg Oral QHS   mupirocin ointment  1 Application Topical  Daily   rosuvastatin  5 mg Oral q AM   sodium chloride flush  3 mL Intravenous Q12H   Continuous Infusions:   LOS: 4 days    Time spent: 40 minutes    Dorcas Carrow, MD [facility]

## 2023-12-26 NOTE — Progress Notes (Signed)
 Mobility Specialist Progress Note:    12/26/23 1551  Mobility  Activity Ambulated with assistance in hallway  Level of Assistance Minimal assist, patient does 75% or more  Assistive Device Front wheel walker  Distance Ambulated (ft) 10 ft  Activity Response Tolerated well  Mobility Referral Yes  Mobility visit 1 Mobility  Mobility Specialist Start Time (ACUTE ONLY) 1540  Mobility Specialist Stop Time (ACUTE ONLY) 1551  Mobility Specialist Time Calculation (min) (ACUTE ONLY) 11 min   Pt received in BR requesting assistance. Pt needed MinA for STS and contact guard during ambulation. Pt agreeable to sit up in chair. Ambulated on 2L/min. Upon sitting in recliner pt was very SOB. Spo2 dropped to 85% during ambulation. After some PLB pt SPO2 returned to 89%. Call bell and personal belongings in reach. All needs met family in room.  Thompson Grayer Mobility Specialist  Please contact vis Secure Chat or  Rehab Office [phone]

## 2023-12-26 NOTE — TOC Progression Note (Addendum)
 Transition of Care [facility]) - Progression Note    Patient Details  Name: [name] MRN: [id number] Date of Birth: 12/10/1940  Transition [facility]) CM/SW Contact  Delilah Shan, LCSWA Phone Number: 12/26/2023, 10:19 AM  Clinical Narrative:     CSW received call from [place] with [facility] who informed CSW they can offer a SNF bed for patient and will have a SNF bed available for patient on Monday if medically ready.Patient accepted SNF bed offer. CSW added patients facility choice to patients insurance authorization. Patients insurance authorization currently pending. CSW informed MD. CSW will continue to follow.  [employer] authorization has been approved for [employer] Auth ID# [id number] Auth ID# [id number]. Insurance authorization approved from 3/14-3/18.  Expected Discharge Plan: Skilled Nursing Facility Barriers to Discharge: Continued Medical Work up, English as a second language teacher, SNF Pending bed offer  Expected Discharge Plan and Services In-house Referral: Clinical Social Work   Post Acute Care Choice: Skilled Nursing Facility Living arrangements for the past 2 months: Single Family Home                                       Social Determinants of Health (SDOH) Interventions SDOH Screenings   Food Insecurity: No Food Insecurity (12/22/2023)  Housing: High Risk (12/22/2023)  Transportation Needs: No Transportation Needs (12/22/2023)  Utilities: Not At Risk (12/22/2023)  Social Connections: Moderately Integrated (12/22/2023)  Tobacco Use: Low Risk  (12/22/2023)    Readmission Risk Interventions    08/30/2021   12:37 PM  Readmission Risk Prevention Plan  Medication Screening Complete  Transportation Screening Complete

## 2023-12-27 DIAGNOSIS — J9601 Acute respiratory failure with hypoxia: Secondary | ICD-10-CM | POA: Diagnosis not present

## 2023-12-27 DIAGNOSIS — S2242XA Multiple fractures of ribs, left side, initial encounter for closed fracture: Secondary | ICD-10-CM | POA: Diagnosis not present

## 2023-12-27 DIAGNOSIS — E1169 Type 2 diabetes mellitus with other specified complication: Secondary | ICD-10-CM | POA: Diagnosis not present

## 2023-12-27 DIAGNOSIS — I4819 Other persistent atrial fibrillation: Secondary | ICD-10-CM | POA: Diagnosis not present

## 2023-12-27 LAB — GLUCOSE, CAPILLARY
Glucose-Capillary: 101 mg/dL — ABNORMAL HIGH (ref 70–99)
Glucose-Capillary: 117 mg/dL — ABNORMAL HIGH (ref 70–99)

## 2023-12-27 MED ORDER — SENNOSIDES-DOCUSATE SODIUM 8.6-50 MG PO TABS
1.0000 | ORAL_TABLET | Freq: Two times a day (BID) | ORAL | Status: DC
  Administered 2023-12-27 – 2023-12-29 (×5): 1 via ORAL
  Filled 2023-12-27 (×6): qty 1

## 2023-12-27 MED ORDER — POLYETHYLENE GLYCOL 3350 17 G PO PACK
17.0000 g | PACK | Freq: Every day | ORAL | Status: DC
  Administered 2023-12-27 – 2023-12-29 (×3): 17 g via ORAL
  Filled 2023-12-27 (×3): qty 1

## 2023-12-27 NOTE — Progress Notes (Signed)
 Mobility Specialist Progress Note:   12/27/23 1103  Mobility  Activity Ambulated with assistance to bathroom;Transferred from bed to chair  Level of Assistance Minimal assist, patient does 75% or more  Assistive Device Front wheel walker  Distance Ambulated (ft) 15 ft  Activity Response Tolerated well  Mobility Referral Yes  Mobility visit 1 Mobility  Mobility Specialist Start Time (ACUTE ONLY) 0950  Mobility Specialist Stop Time (ACUTE ONLY) 1015  Mobility Specialist Time Calculation (min) (ACUTE ONLY) 25 min   Pt received in bed, agreeable to mobility. ModA bed mobility. MinA to stand. Pt requesting to use BR. BM unsuccessful. Pt ambulate to chair with only minG assist. C/o rib pain and slight SOB during session, otherwise asx throughout. VSS on 2 L. Pt left in chair with call bell in reach and all needs met.   Leory Plowman  Mobility Specialist Please contact via [employer] office at [phone]

## 2023-12-27 NOTE — Progress Notes (Signed)
 PROGRESS NOTE    Natasha Glass  ZOX:[id number] DOB: 17-Jul-1941 DOA: 12/21/2023 PCP: [name], PA    Brief Narrative:  83 y.o. female with medical history significant for hypertension, type 2 diabetes mellitus, BMI 46, OSA on CPAP, atrial fibrillation on Eliquis, chronic HFpEF, and electrical cardioversion on 12/19/2023 complicated by long pauses/transient asystole treated with several seconds of CPR and admitted due to chest pain secondary to rib fractures from CPR.  Patient had some chest discomfort initially after the procedure and brief CPR on 12/19/2023, but felt well enough to return home at the time.  Unfortunately, her chest discomfort has progressively worsened and is now severe when she takes a deep breath, coughs, or moves.     ED Course: Upon arrival to the ED, patient is found to be afebrile and saturating mid 80s on room air with tachypnea, tachycardia, and elevated blood pressure.  EKG demonstrates atrial fibrillation with rate 114.  CTA chest is most notable for left anterolateral rib fractures involving ribs 2 through 7, as well as ribs 3 through 5 on the right. Remains in the hospital.  Poor pain control.  Subjective:  Patient seen and examined.  No other overnight events.  Pain remains on deep breathing and coughing.  She was able to get to bathroom with minimal help.  Complains of being constipated.   Assessment & Plan:   Multiple rib fractures secondary to CPR, ongoing pain and disability.  Hypoxemia: Patient currently on as needed Vicodin and Dilaudid. Lidocaine patch Robaxin 500 mg 4 times daily scheduled Tylenol 1 g every 6 hours as scheduled As needed IV Dilaudid and oral Vicodin for moderate to severe pain. Continue to do incentive spirometry.  Continue attempt to mobility.  Referred to SNF for rehab. Bowel regimen with Colace twice daily, MiraLAX daily today.  Paroxysmal A-fib with RVR: Rate improved.  Currently rate controlled.  Patient therapeutic on  Eliquis.  On Tikosyn and metoprolol.  Electrolytes are adequate.  Chronic heart failure with preserved ejection fraction: Type 2 diabetes: Can go back on metformin. Sleep apnea on CPAP Chronic right leg wounds  Stable.  Transfer to SNF when bed available.   DVT prophylaxis:  apixaban (ELIQUIS) tablet 5 mg   Code Status: Full code Family Communication: None at the bedside Disposition Plan: Status is: Inpatient Remains inpatient appropriate because: Stabilizing.  Transfer to SNF when bed available.     Consultants:  Cardiology  Procedures:  None  Antimicrobials:  None     Objective: Vitals:   12/26/23 1624 12/26/23 2002 12/27/23 0520 12/27/23 0757  BP: 108/71 (!) 129/103 115/75   Pulse: 82  97 87  Resp: 17 19 19    Temp: 97.7 F (36.5 C) 97.9 F (36.6 C) 98 F (36.7 C)   TempSrc: Oral     SpO2: 99% 97% 96% 97%  Weight:      Height:        Intake/Output Summary (Last 24 hours) at 12/27/2023 1157 Last data filed at 12/27/2023 1147 Gross per 24 hour  Intake 603 ml  Output 300 ml  Net 303 ml   Filed Weights   12/21/23 2237  Weight: 118 kg    Examination:  General exam: Calm and comfortable at rest.  Sitting in couch.  On 2 L oxygen. Respiratory system: Clear to auscultation. Respiratory effort reduced.  No palpable tenderness. Cardiovascular system: S1 & S2 heard, RRR. No pedal edema. Gastrointestinal system: Soft and nontender.  Bowel sound present. Central nervous system:  Alert and oriented. No focal neurological deficits.  Right lower extremity stasis ulcer on Unna boot.     Data Reviewed: I have personally reviewed following labs and imaging studies  CBC: Recent Labs  Lab 12/21/23 2248 12/22/23 0530 12/24/23 0559  WBC 6.9 5.7 7.0  HGB 12.5 11.7* 11.2*  HCT 41.7 38.7 35.8*  MCV 92.5 92.4 90.6  PLT 133* 106* 108*   Basic Metabolic Panel: Recent Labs  Lab 12/21/23 2248 12/22/23 0530 12/23/23 0641 12/24/23 0559 12/24/23 0900  NA 140  141 139 135  --   K 4.2 4.4 3.9 4.0  --   CL 105 108 106 103  --   CO2 22 25 24 25   --   GLUCOSE 125* 120* 122* 117*  --   BUN 26* 23 21 30*  --   CREATININE 0.65 0.74 0.72 0.94  --   CALCIUM 9.9 9.2 9.0 8.8*  --   MG  --  1.9  --   --  2.1   GFR: Estimated Creatinine Clearance: 57.3 mL/min (by C-G formula based on SCr of 0.94 mg/dL). Liver Function Tests: Recent Labs  Lab 12/23/23 0641 12/24/23 0559  AST 15 14*  ALT 9 9  ALKPHOS 45 44  BILITOT 0.8 0.7  PROT 6.5 6.3*  ALBUMIN 3.1* 2.8*   No results for input(s): "LIPASE", "AMYLASE" in the last 168 hours. No results for input(s): "AMMONIA" in the last 168 hours. Coagulation Profile: No results for input(s): "INR", "PROTIME" in the last 168 hours. Cardiac Enzymes: No results for input(s): "CKTOTAL", "CKMB", "CKMBINDEX", "TROPONINI" in the last 168 hours. BNP (last 3 results) No results for input(s): "PROBNP" in the last 8760 hours. HbA1C: No results for input(s): "HGBA1C" in the last 72 hours.  CBG: Recent Labs  Lab 12/26/23 0800 12/26/23 1208 12/26/23 1626 12/26/23 2005 12/27/23 0815  GLUCAP 159* 93 134* 143* 101*   Lipid Profile: No results for input(s): "CHOL", "HDL", "LDLCALC", "TRIG", "CHOLHDL", "LDLDIRECT" in the last 72 hours. Thyroid Function Tests: No results for input(s): "TSH", "T4TOTAL", "FREET4", "T3FREE", "THYROIDAB" in the last 72 hours. Anemia Panel: No results for input(s): "VITAMINB12", "FOLATE", "FERRITIN", "TIBC", "IRON", "RETICCTPCT" in the last 72 hours. Sepsis Labs: No results for input(s): "PROCALCITON", "LATICACIDVEN" in the last 168 hours.  No results found for this or any previous visit (from the past 240 hours).       Radiology Studies: No results found.      Scheduled Meds:  acetaminophen  1,000 mg Oral Q6H   apixaban  5 mg Oral BID   dofetilide  250 mcg Oral BID   furosemide  40 mg Oral Daily   lidocaine  1 patch Transdermal Daily   losartan  25 mg Oral Daily    metFORMIN  500 mg Oral BID WC   methocarbamol  500 mg Oral QID   metoprolol succinate  25 mg Oral QHS   mupirocin ointment  1 Application Topical Daily   polyethylene glycol  17 g Oral Daily   rosuvastatin  5 mg Oral q AM   senna-docusate  1 tablet Oral BID   sodium chloride flush  3 mL Intravenous Q12H   Continuous Infusions:   LOS: 5 days    Time spent: 40 minutes    Dorcas Carrow, MD [facility]

## 2023-12-27 NOTE — Plan of Care (Signed)
  Problem: Coping: Goal: Ability to adjust to condition or change in health will improve Outcome: Progressing   Problem: Nutritional: Goal: Maintenance of adequate nutrition will improve Outcome: Progressing   Problem: Health Behavior/Discharge Planning: Goal: Ability to manage health-related needs will improve Outcome: Progressing   Problem: Clinical Measurements: Goal: Diagnostic test results will improve Outcome: Progressing   Problem: Activity: Goal: Risk for activity intolerance will decrease Outcome: Progressing   Problem: Nutrition: Goal: Adequate nutrition will be maintained Outcome: Progressing   Problem: Coping: Goal: Level of anxiety will decrease Outcome: Completed/Met

## 2023-12-27 NOTE — Plan of Care (Signed)

## 2023-12-27 NOTE — Progress Notes (Signed)
   12/27/23 2235  BiPAP/CPAP/SIPAP  BiPAP/CPAP/SIPAP Pt Type Adult  BiPAP/CPAP/SIPAP Resmed  Mask Type Full face mask  Dentures removed? Not applicable  Mask Size Medium  Respiratory Rate 16 breaths/min  Flow Rate 2 lpm  CPAP 5 cmH2O  Patient Home Equipment No  Auto Titrate No  Nasal massage performed No (comment)  CPAP/SIPAP surface wiped down Yes

## 2023-12-28 DIAGNOSIS — S2242XA Multiple fractures of ribs, left side, initial encounter for closed fracture: Secondary | ICD-10-CM | POA: Diagnosis not present

## 2023-12-28 DIAGNOSIS — J9601 Acute respiratory failure with hypoxia: Secondary | ICD-10-CM | POA: Diagnosis not present

## 2023-12-28 DIAGNOSIS — E1169 Type 2 diabetes mellitus with other specified complication: Secondary | ICD-10-CM | POA: Diagnosis not present

## 2023-12-28 DIAGNOSIS — I4819 Other persistent atrial fibrillation: Secondary | ICD-10-CM | POA: Diagnosis not present

## 2023-12-28 NOTE — Plan of Care (Signed)
  Problem: Education: Goal: Ability to describe self-care measures that may prevent or decrease complications (Diabetes Survival Skills Education) will improve Outcome: Progressing Goal: Individualized Educational Video(s) Outcome: Progressing   Problem: Coping: Goal: Ability to adjust to condition or change in health will improve Outcome: Progressing   Problem: Fluid Volume: Goal: Ability to maintain a balanced intake and output will improve Outcome: Progressing   Problem: Health Behavior/Discharge Planning: Goal: Ability to identify and utilize available resources and services will improve Outcome: Progressing Goal: Ability to manage health-related needs will improve Outcome: Progressing   Problem: Metabolic: Goal: Ability to maintain appropriate glucose levels will improve Outcome: Progressing   Problem: Nutritional: Goal: Maintenance of adequate nutrition will improve Outcome: Progressing Goal: Progress toward achieving an optimal weight will improve Outcome: Progressing   Problem: Skin Integrity: Goal: Risk for impaired skin integrity will decrease Outcome: Progressing   Problem: Tissue Perfusion: Goal: Adequacy of tissue perfusion will improve Outcome: Progressing   Problem: Education: Goal: Knowledge of General Education information will improve Description: Including pain rating scale, medication(s)/side effects and non-pharmacologic comfort measures Outcome: Progressing   Problem: Health Behavior/Discharge Planning: Goal: Ability to manage health-related needs will improve Outcome: Progressing   Problem: Clinical Measurements: Goal: Ability to maintain clinical measurements within normal limits will improve Outcome: Progressing Goal: Will remain free from infection Outcome: Progressing Goal: Diagnostic test results will improve Outcome: Progressing Goal: Respiratory complications will improve Outcome: Progressing Goal: Cardiovascular complication will  be avoided Outcome: Progressing   Problem: Activity: Goal: Risk for activity intolerance will decrease Outcome: Progressing   Problem: Nutrition: Goal: Adequate nutrition will be maintained Outcome: Progressing   Problem: Elimination: Goal: Will not experience complications related to bowel motility Outcome: Progressing Goal: Will not experience complications related to urinary retention Outcome: Progressing   Problem: Pain Managment: Goal: General experience of comfort will improve and/or be controlled Outcome: Progressing   Problem: Safety: Goal: Ability to remain free from injury will improve Outcome: Progressing   Problem: Skin Integrity: Goal: Risk for impaired skin integrity will decrease Outcome: Progressing

## 2023-12-28 NOTE — Progress Notes (Signed)
 Mobility Specialist Progress Note:   12/28/23 1256  Mobility  Activity Transferred from chair to bed  Level of Assistance Minimal assist, patient does 75% or more  Assistive Device Front wheel walker  Distance Ambulated (ft) 5 ft  Activity Response Tolerated well  Mobility Referral Yes  Mobility visit 1 Mobility  Mobility Specialist Start Time (ACUTE ONLY) 1240  Mobility Specialist Stop Time (ACUTE ONLY) 1255  Mobility Specialist Time Calculation (min) (ACUTE ONLY) 15 min   Pt received in chair, requesting to return back to bed. MinA to stand. MinG during ambulation. Pt c/o rib pain but stated this has improved compared to previous days. ModA to return to supine. Pt left in bed with call bell in reach and all needs met.   Leory Plowman  Mobility Specialist Please contact via [employer] office at [phone]

## 2023-12-28 NOTE — Progress Notes (Signed)
 PROGRESS NOTE    Natasha Glass  JWJ:[id number] DOB: Sep 26, 1941 DOA: 12/21/2023 PCP: [name], PA    Brief Narrative:  83 y.o. female with medical history significant for hypertension, type 2 diabetes mellitus, BMI 46, OSA on CPAP, atrial fibrillation on Eliquis, chronic HFpEF, and electrical cardioversion on 12/19/2023 complicated by long pauses/transient asystole treated with several seconds of CPR and admitted due to chest pain secondary to rib fractures from CPR.  Patient had some chest discomfort initially after the procedure and brief CPR on 12/19/2023, but felt well enough to return home at the time.  Unfortunately, her chest discomfort has progressively worsened and is now severe when she takes a deep breath, coughs, or moves.     ED Course: Upon arrival to the ED, patient is found to be afebrile and saturating mid 80s on room air with tachypnea, tachycardia, and elevated blood pressure.  EKG demonstrates atrial fibrillation with rate 114.  CTA chest is most notable for left anterolateral rib fractures involving ribs 2 through 7, as well as ribs 3 through 5 on the right. Remains in the hospital.  Poor pain control.  Subjective:  Patient seen and examined.  No overnight events.  Still painful but better controlled than before.   Assessment & Plan:   Multiple rib fractures secondary to CPR, ongoing pain and disability.  Hypoxemia: Patient currently on as needed Vicodin and Dilaudid. Lidocaine patch Robaxin 500 mg 4 times daily scheduled Tylenol 1 g every 6 hours as scheduled As needed IV Dilaudid and oral Vicodin for moderate to severe pain. Continue to do incentive spirometry.  Continue attempt to mobility.  Referred to SNF for rehab. Bowel regimen with Colace twice daily, MiraLAX daily.  Paroxysmal A-fib with RVR: Rate improved.  Currently rate controlled.  Patient therapeutic on Eliquis.  On Tikosyn and metoprolol.  Electrolytes are adequate.  Chronic heart failure with  preserved ejection fraction: Type 2 diabetes: Can go back on metformin. Sleep apnea on CPAP Chronic right leg wounds  Stable.  Transfer to SNF when bed available.   DVT prophylaxis:  apixaban (ELIQUIS) tablet 5 mg   Code Status: Full code Family Communication: None at the bedside Disposition Plan: Status is: Inpatient Remains inpatient appropriate because: Stabilizing.  Transfer to SNF when bed available.     Consultants:  Cardiology  Procedures:  None  Antimicrobials:  None     Objective: Vitals:   12/27/23 1605 12/27/23 2021 12/28/23 0542 12/28/23 0815  BP: 118/65 130/74 129/87 (!) 119/91  Pulse: 79 71 81 77  Resp: 18 19  16   Temp: 97.8 F (36.6 C) 97.9 F (36.6 C) 97.7 F (36.5 C) 97.9 F (36.6 C)  TempSrc: Oral Oral Oral Oral  SpO2: 98% 98% 99% 100%  Weight:      Height:        Intake/Output Summary (Last 24 hours) at 12/28/2023 1055 Last data filed at 12/28/2023 0809 Gross per 24 hour  Intake 583 ml  Output 900 ml  Net -317 ml   Filed Weights   12/21/23 2237  Weight: 118 kg    Examination:  General exam: Comfortable today.  Interactive. Respiratory system: Clear to auscultation. Respiratory effort reduced.  No palpable tenderness. Cardiovascular system: S1 & S2 heard, RRR. No pedal edema. Gastrointestinal system: Soft and nontender.  Bowel sound present. Central nervous system: Alert and oriented. No focal neurological deficits.  Right lower extremity stasis ulcer on Unna boot.     Data Reviewed: I have personally  reviewed following labs and imaging studies  CBC: Recent Labs  Lab 12/21/23 2248 12/22/23 0530 12/24/23 0559  WBC 6.9 5.7 7.0  HGB 12.5 11.7* 11.2*  HCT 41.7 38.7 35.8*  MCV 92.5 92.4 90.6  PLT 133* 106* 108*   Basic Metabolic Panel: Recent Labs  Lab 12/21/23 2248 12/22/23 0530 12/23/23 0641 12/24/23 0559 12/24/23 0900  NA 140 141 139 135  --   K 4.2 4.4 3.9 4.0  --   CL 105 108 106 103  --   CO2 22 25 24 25    --   GLUCOSE 125* 120* 122* 117*  --   BUN 26* 23 21 30*  --   CREATININE 0.65 0.74 0.72 0.94  --   CALCIUM 9.9 9.2 9.0 8.8*  --   MG  --  1.9  --   --  2.1   GFR: Estimated Creatinine Clearance: 57.3 mL/min (by C-G formula based on SCr of 0.94 mg/dL). Liver Function Tests: Recent Labs  Lab 12/23/23 0641 12/24/23 0559  AST 15 14*  ALT 9 9  ALKPHOS 45 44  BILITOT 0.8 0.7  PROT 6.5 6.3*  ALBUMIN 3.1* 2.8*   No results for input(s): "LIPASE", "AMYLASE" in the last 168 hours. No results for input(s): "AMMONIA" in the last 168 hours. Coagulation Profile: No results for input(s): "INR", "PROTIME" in the last 168 hours. Cardiac Enzymes: No results for input(s): "CKTOTAL", "CKMB", "CKMBINDEX", "TROPONINI" in the last 168 hours. BNP (last 3 results) No results for input(s): "PROBNP" in the last 8760 hours. HbA1C: No results for input(s): "HGBA1C" in the last 72 hours.  CBG: Recent Labs  Lab 12/26/23 1208 12/26/23 1626 12/26/23 2005 12/27/23 0815 12/27/23 1227  GLUCAP 93 134* 143* 101* 117*   Lipid Profile: No results for input(s): "CHOL", "HDL", "LDLCALC", "TRIG", "CHOLHDL", "LDLDIRECT" in the last 72 hours. Thyroid Function Tests: No results for input(s): "TSH", "T4TOTAL", "FREET4", "T3FREE", "THYROIDAB" in the last 72 hours. Anemia Panel: No results for input(s): "VITAMINB12", "FOLATE", "FERRITIN", "TIBC", "IRON", "RETICCTPCT" in the last 72 hours. Sepsis Labs: No results for input(s): "PROCALCITON", "LATICACIDVEN" in the last 168 hours.  No results found for this or any previous visit (from the past 240 hours).       Radiology Studies: No results found.      Scheduled Meds:  acetaminophen  1,000 mg Oral Q6H   apixaban  5 mg Oral BID   dofetilide  250 mcg Oral BID   furosemide  40 mg Oral Daily   lidocaine  1 patch Transdermal Daily   losartan  25 mg Oral Daily   metFORMIN  500 mg Oral BID WC   methocarbamol  500 mg Oral QID   metoprolol succinate  25  mg Oral QHS   mupirocin ointment  1 Application Topical Daily   polyethylene glycol  17 g Oral Daily   rosuvastatin  5 mg Oral q AM   senna-docusate  1 tablet Oral BID   sodium chloride flush  3 mL Intravenous Q12H   Continuous Infusions:   LOS: 6 days    Time spent: 40 minutes    Dorcas Carrow, MD [facility]

## 2023-12-28 NOTE — Plan of Care (Signed)
  Problem: Health Behavior/Discharge Planning: Goal: Ability to identify and utilize available resources and services will improve Outcome: Progressing   Problem: Nutritional: Goal: Maintenance of adequate nutrition will improve Outcome: Progressing   Problem: Health Behavior/Discharge Planning: Goal: Ability to manage health-related needs will improve Outcome: Progressing   Problem: Activity: Goal: Risk for activity intolerance will decrease Outcome: Progressing

## 2023-12-29 DIAGNOSIS — S2242XB Multiple fractures of ribs, left side, initial encounter for open fracture: Secondary | ICD-10-CM

## 2023-12-29 DIAGNOSIS — Z9989 Dependence on other enabling machines and devices: Secondary | ICD-10-CM | POA: Diagnosis not present

## 2023-12-29 DIAGNOSIS — R0989 Other specified symptoms and signs involving the circulatory and respiratory systems: Secondary | ICD-10-CM | POA: Diagnosis not present

## 2023-12-29 DIAGNOSIS — R262 Difficulty in walking, not elsewhere classified: Secondary | ICD-10-CM | POA: Diagnosis not present

## 2023-12-29 DIAGNOSIS — E876 Hypokalemia: Secondary | ICD-10-CM | POA: Diagnosis not present

## 2023-12-29 DIAGNOSIS — R278 Other lack of coordination: Secondary | ICD-10-CM | POA: Diagnosis not present

## 2023-12-29 DIAGNOSIS — D649 Anemia, unspecified: Secondary | ICD-10-CM | POA: Diagnosis not present

## 2023-12-29 DIAGNOSIS — M6281 Muscle weakness (generalized): Secondary | ICD-10-CM | POA: Diagnosis not present

## 2023-12-29 DIAGNOSIS — M51369 Other intervertebral disc degeneration, lumbar region without mention of lumbar back pain or lower extremity pain: Secondary | ICD-10-CM | POA: Diagnosis not present

## 2023-12-29 DIAGNOSIS — I4819 Other persistent atrial fibrillation: Secondary | ICD-10-CM | POA: Diagnosis not present

## 2023-12-29 DIAGNOSIS — L24A9 Irritant contact dermatitis due friction or contact with other specified body fluids: Secondary | ICD-10-CM | POA: Diagnosis not present

## 2023-12-29 DIAGNOSIS — Z9981 Dependence on supplemental oxygen: Secondary | ICD-10-CM | POA: Diagnosis not present

## 2023-12-29 DIAGNOSIS — I700 Atherosclerosis of aorta: Secondary | ICD-10-CM | POA: Diagnosis not present

## 2023-12-29 DIAGNOSIS — E1169 Type 2 diabetes mellitus with other specified complication: Secondary | ICD-10-CM | POA: Diagnosis not present

## 2023-12-29 DIAGNOSIS — K219 Gastro-esophageal reflux disease without esophagitis: Secondary | ICD-10-CM | POA: Diagnosis not present

## 2023-12-29 DIAGNOSIS — E119 Type 2 diabetes mellitus without complications: Secondary | ICD-10-CM | POA: Diagnosis not present

## 2023-12-29 DIAGNOSIS — M109 Gout, unspecified: Secondary | ICD-10-CM | POA: Diagnosis not present

## 2023-12-29 DIAGNOSIS — I83018 Varicose veins of right lower extremity with ulcer other part of lower leg: Secondary | ICD-10-CM | POA: Diagnosis not present

## 2023-12-29 DIAGNOSIS — M1990 Unspecified osteoarthritis, unspecified site: Secondary | ICD-10-CM | POA: Diagnosis not present

## 2023-12-29 DIAGNOSIS — J9811 Atelectasis: Secondary | ICD-10-CM | POA: Diagnosis not present

## 2023-12-29 DIAGNOSIS — S2249XA Multiple fractures of ribs, unspecified side, initial encounter for closed fracture: Secondary | ICD-10-CM | POA: Diagnosis not present

## 2023-12-29 DIAGNOSIS — I10 Essential (primary) hypertension: Secondary | ICD-10-CM | POA: Diagnosis not present

## 2023-12-29 DIAGNOSIS — Z9181 History of falling: Secondary | ICD-10-CM | POA: Diagnosis not present

## 2023-12-29 DIAGNOSIS — Z86711 Personal history of pulmonary embolism: Secondary | ICD-10-CM | POA: Diagnosis not present

## 2023-12-29 DIAGNOSIS — E782 Mixed hyperlipidemia: Secondary | ICD-10-CM | POA: Diagnosis not present

## 2023-12-29 DIAGNOSIS — R0602 Shortness of breath: Secondary | ICD-10-CM | POA: Diagnosis not present

## 2023-12-29 DIAGNOSIS — E785 Hyperlipidemia, unspecified: Secondary | ICD-10-CM | POA: Diagnosis not present

## 2023-12-29 DIAGNOSIS — L97918 Non-pressure chronic ulcer of unspecified part of right lower leg with other specified severity: Secondary | ICD-10-CM | POA: Diagnosis not present

## 2023-12-29 DIAGNOSIS — K51919 Ulcerative colitis, unspecified with unspecified complications: Secondary | ICD-10-CM | POA: Diagnosis not present

## 2023-12-29 DIAGNOSIS — Z7901 Long term (current) use of anticoagulants: Secondary | ICD-10-CM | POA: Diagnosis not present

## 2023-12-29 DIAGNOSIS — S2249XD Multiple fractures of ribs, unspecified side, subsequent encounter for fracture with routine healing: Secondary | ICD-10-CM | POA: Diagnosis not present

## 2023-12-29 DIAGNOSIS — J9690 Respiratory failure, unspecified, unspecified whether with hypoxia or hypercapnia: Secondary | ICD-10-CM | POA: Diagnosis not present

## 2023-12-29 DIAGNOSIS — Z741 Need for assistance with personal care: Secondary | ICD-10-CM | POA: Diagnosis not present

## 2023-12-29 DIAGNOSIS — I5032 Chronic diastolic (congestive) heart failure: Secondary | ICD-10-CM | POA: Diagnosis not present

## 2023-12-29 DIAGNOSIS — I509 Heart failure, unspecified: Secondary | ICD-10-CM | POA: Diagnosis not present

## 2023-12-29 DIAGNOSIS — Z743 Need for continuous supervision: Secondary | ICD-10-CM | POA: Diagnosis not present

## 2023-12-29 DIAGNOSIS — I110 Hypertensive heart disease with heart failure: Secondary | ICD-10-CM | POA: Diagnosis not present

## 2023-12-29 DIAGNOSIS — J9601 Acute respiratory failure with hypoxia: Secondary | ICD-10-CM | POA: Diagnosis not present

## 2023-12-29 DIAGNOSIS — G4733 Obstructive sleep apnea (adult) (pediatric): Secondary | ICD-10-CM | POA: Diagnosis not present

## 2023-12-29 DIAGNOSIS — H3530 Unspecified macular degeneration: Secondary | ICD-10-CM | POA: Diagnosis not present

## 2023-12-29 DIAGNOSIS — I480 Paroxysmal atrial fibrillation: Secondary | ICD-10-CM | POA: Diagnosis not present

## 2023-12-29 DIAGNOSIS — R5381 Other malaise: Secondary | ICD-10-CM | POA: Diagnosis not present

## 2023-12-29 DIAGNOSIS — R0902 Hypoxemia: Secondary | ICD-10-CM | POA: Diagnosis not present

## 2023-12-29 DIAGNOSIS — K5900 Constipation, unspecified: Secondary | ICD-10-CM | POA: Diagnosis not present

## 2023-12-29 MED ORDER — LIDOCAINE 5 % EX PTCH
1.0000 | MEDICATED_PATCH | Freq: Every day | CUTANEOUS | Status: AC
Start: 2023-12-29 — End: ?

## 2023-12-29 MED ORDER — SENNOSIDES-DOCUSATE SODIUM 8.6-50 MG PO TABS: 1.0000 | ORAL_TABLET | Freq: Two times a day (BID) | ORAL | Status: AC

## 2023-12-29 MED ORDER — ACETAMINOPHEN 500 MG PO TABS: 1000.0000 mg | ORAL_TABLET | Freq: Four times a day (QID) | ORAL | 0 refills | Status: AC | PRN

## 2023-12-29 MED ORDER — METHOCARBAMOL 500 MG PO TABS: 500.0000 mg | ORAL_TABLET | Freq: Four times a day (QID) | ORAL | Status: AC

## 2023-12-29 MED ORDER — POLYETHYLENE GLYCOL 3350 17 G PO PACK: 17.0000 g | PACK | Freq: Every day | ORAL | Status: DC

## 2023-12-29 NOTE — Plan of Care (Signed)
  Problem: Education: Goal: Ability to describe self-care measures that may prevent or decrease complications (Diabetes Survival Skills Education) will improve Outcome: Adequate for Discharge Goal: Individualized Educational Video(s) Outcome: Adequate for Discharge   Problem: Coping: Goal: Ability to adjust to condition or change in health will improve Outcome: Adequate for Discharge   Problem: Fluid Volume: Goal: Ability to maintain a balanced intake and output will improve Outcome: Adequate for Discharge   Problem: Health Behavior/Discharge Planning: Goal: Ability to identify and utilize available resources and services will improve Outcome: Adequate for Discharge Goal: Ability to manage health-related needs will improve Outcome: Adequate for Discharge   Problem: Metabolic: Goal: Ability to maintain appropriate glucose levels will improve Outcome: Adequate for Discharge   Problem: Nutritional: Goal: Maintenance of adequate nutrition will improve Outcome: Adequate for Discharge Goal: Progress toward achieving an optimal weight will improve Outcome: Adequate for Discharge   Problem: Skin Integrity: Goal: Risk for impaired skin integrity will decrease Outcome: Adequate for Discharge   Problem: Tissue Perfusion: Goal: Adequacy of tissue perfusion will improve Outcome: Adequate for Discharge   Problem: Education: Goal: Knowledge of General Education information will improve Description: Including pain rating scale, medication(s)/side effects and non-pharmacologic comfort measures Outcome: Adequate for Discharge   Problem: Health Behavior/Discharge Planning: Goal: Ability to manage health-related needs will improve Outcome: Adequate for Discharge   Problem: Clinical Measurements: Goal: Ability to maintain clinical measurements within normal limits will improve Outcome: Adequate for Discharge Goal: Will remain free from infection Outcome: Adequate for Discharge Goal:  Diagnostic test results will improve Outcome: Adequate for Discharge Goal: Respiratory complications will improve Outcome: Adequate for Discharge Goal: Cardiovascular complication will be avoided Outcome: Adequate for Discharge   Problem: Activity: Goal: Risk for activity intolerance will decrease Outcome: Adequate for Discharge   Problem: Nutrition: Goal: Adequate nutrition will be maintained Outcome: Adequate for Discharge   Problem: Elimination: Goal: Will not experience complications related to bowel motility Outcome: Adequate for Discharge Goal: Will not experience complications related to urinary retention Outcome: Adequate for Discharge   Problem: Pain Managment: Goal: General experience of comfort will improve and/or be controlled Outcome: Adequate for Discharge   Problem: Safety: Goal: Ability to remain free from injury will improve Outcome: Adequate for Discharge   Problem: Skin Integrity: Goal: Risk for impaired skin integrity will decrease Outcome: Adequate for Discharge

## 2023-12-29 NOTE — TOC Transition Note (Addendum)
 Transition of Care [facility]) - Discharge Note   Patient Details  Name: Natasha Glass MRN: [id number] Date of Birth: 01-Jun-1941  [facility]) CM/SW Contact:  Lorri Frederick, LCSW Phone Number: 12/29/2023, 10:35 AM   Clinical Narrative:   Pt discharging to [facility].  RN call report to [phone].  Son Merlyn Albert will transport pt.  Pt will need to be brought to main north tower entrance with assistance getting into the vehicle.   1220: CSW informed that pt has new O2 requirement, no community provider, will require PTAR transport for O2.  Son informed.  PTAR called.   Final next level of care: Skilled Nursing Facility Barriers to Discharge: Barriers Resolved   Patient Goals and CMS Choice Patient states their goals for this hospitalization and ongoing recovery are:: Pt notes she would like for her pain to be controlled. CMS Medicare.gov Compare Post Acute Care list provided to:: Patient Choice offered to / list presented to : Patient      Discharge Placement              Patient chooses bed at:  [facility]) Patient to be transferred to facility by: son Merlyn Albert Name of family member notified: son Merlyn Albert Patient and family notified of of transfer: 12/29/23  Discharge Plan and Services Additional resources added to the After Visit Summary for   In-house Referral: Clinical Social Work   Post Acute Care Choice: Skilled Nursing Facility                               Social Drivers of Health (SDOH) Interventions SDOH Screenings   Food Insecurity: No Food Insecurity (12/22/2023)  Housing: High Risk (12/22/2023)  Transportation Needs: No Transportation Needs (12/22/2023)  Utilities: Not At Risk (12/22/2023)  Social Connections: Moderately Integrated (12/22/2023)  Tobacco Use: Low Risk  (12/22/2023)     Readmission Risk Interventions    08/30/2021   12:37 PM  Readmission Risk Prevention Plan  Medication Screening Complete  Transportation Screening  Complete

## 2023-12-29 NOTE — TOC Progression Note (Addendum)
 Transition of Care [facility]) - Progression Note    Patient Details  Name: Natasha Glass MRN: [id number] Date of Birth: 05/06/1941  Transition of Care [facility]) CM/SW Contact  Lorri Frederick, LCSW Phone Number: 12/29/2023, 10:20 AM  Clinical Narrative:   CSW confirmed with Kristin/[facility] that they can receive pt today.  CSW spoke with son Merlyn Albert regarding his ability to provide transport to SNF.  He is at work but will call back shortly with plan.    1030: TC Fred.  He will need to get pt [name] to transport, which is in [place].  Will be 09-1229 before he can get here.      Expected Discharge Plan: Skilled Nursing Facility Barriers to Discharge: Continued Medical Work up, English as a second language teacher, SNF Pending bed offer  Expected Discharge Plan and Services In-house Referral: Clinical Social Work   Post Acute Care Choice: Skilled Nursing Facility Living arrangements for the past 2 months: Single Family Home Expected Discharge Date: 12/29/23                                     Social Determinants of Health (SDOH) Interventions SDOH Screenings   Food Insecurity: No Food Insecurity (12/22/2023)  Housing: High Risk (12/22/2023)  Transportation Needs: No Transportation Needs (12/22/2023)  Utilities: Not At Risk (12/22/2023)  Social Connections: Moderately Integrated (12/22/2023)  Tobacco Use: Low Risk  (12/22/2023)    Readmission Risk Interventions    08/30/2021   12:37 PM  Readmission Risk Prevention Plan  Medication Screening Complete  Transportation Screening Complete

## 2023-12-29 NOTE — Progress Notes (Signed)
 Physical Therapy Treatment Patient Details Name: Natasha Glass MRN: [id number] DOB: [birth year] Today's Date: 12/29/2023   History of Present Illness 83 y.o. female presents to [facility] hospital on 12/21/2023 with chest pain and SOB after recent cardioversion with subsequent transient asystole and several seconds of CPR on 3/7. Chest CT notable for L 2-7 rib fxs and R 3-5 rib fxs. PMH includes HTN, DMII, OSA on CPAP, afib, HFpEF.    PT Comments  Pt received up in bathroom on toilet in care of nurse tech, pt pleasantly agreeable to therapy session and with good participation in standing activity, transfer training and short distance gait trial with RW support. Pt needing up to minA for transfers/gait with RW and very dyspneic with minimal exertion. When PTA arrived, pt was on RA in bathroom but RR elevated >20 rpm, pt HR/SpO2 checked with portable pulse ox and pt hypoxic to 83% on RA and HR 155 bpm. Pt assisted to sit down on BSC and 2L O2 [place] brought to room, pt SpO2 quickly improves to 92% and greater on 2L O2 [place] while sitting/standing, HR 120-130 bpm standing on 2L [place] while standing to finish dressing and HR decreased to ~80 bpm in recliner post-exertion, SpO2 96% and above on 2L O2 [place] in recliner. Patient will benefit from continued inpatient follow up therapy, <3 hours/day, typically pt lives alone and is typically modI with AD without needing supplemental O2 at home. Currently, she is not safe to mobilize without external lift/steadying assist even for short distances when mobilizing in bathroom and still hypoxic unless on supplemental O2.      If plan is discharge home, recommend the following: A lot of help with walking and/or transfers;A lot of help with bathing/dressing/bathroom;Assistance with cooking/housework;Assist for transportation;Help with stairs or ramp for entrance   Can travel by private vehicle     Yes (WC [name])  Equipment Recommendations  None recommended by PT    Recommendations for  Other Services       Precautions / Restrictions Precautions Precautions: Fall Recall of Precautions/Restrictions: Intact Precaution/Restrictions Comments: monitor O2/HR, hypoxia with exertion, tachy hx afib Restrictions Weight Bearing Restrictions Per Provider Order: No     Mobility  Bed Mobility Overal bed mobility: Needs Assistance             General bed mobility comments: pt received up in bathroom on toilet in care of NT    Transfers Overall transfer level: Needs assistance Equipment used: Rolling walker (2 wheels) Transfers: Sit to/from Stand, Bed to chair/wheelchair/BSC Sit to Stand: Min assist   Step pivot transfers: Min assist       General transfer comment: minA from lower height toilet seat using wall rail, then pivoted to [facility], then CGA to stand from BSC>RW    Ambulation/Gait Ambulation/Gait assistance: Min assist Gait Distance (Feet): 6 Feet Assistive device: Rolling walker (2 wheels) Gait Pattern/deviations: Step-to pattern, Wide base of support Gait velocity: reduced Gait velocity interpretation: <1.31 ft/sec, indicative of household ambulator   General Gait Details: pt with slowed step-to gait, reduced foot clearance bilaterally, wide BOS 2/2 body habitus; significant DOE 3/4 and increased RR.   Stairs             Wheelchair Mobility     Tilt Bed    Modified Rankin (Stroke Patients Only)       Balance Overall balance assessment: Needs assistance Sitting-balance support: Bilateral upper extremity supported, Feet supported Sitting balance-Leahy Scale: Fair     Standing balance support:  Bilateral upper extremity supported, Reliant on assistive device for balance Standing balance-Leahy Scale: Poor Standing balance comment: very reliant on RW                            Communication Communication Communication: No apparent difficulties  Cognition Arousal: Alert Behavior During Therapy: WFL for tasks  assessed/performed   PT - Cognitive impairments: No apparent impairments                       PT - Cognition Comments: Pt needs reminders for pursed-lip breathing and to notify staff when she needs rest break. Very fast RR when fatigued. Following commands: Intact      Cueing Cueing Techniques: Verbal cues  Exercises Other Exercises Other Exercises: IS x 5 reps pt achieves ~777mL; reinforced frequency Other Exercises: pursed-lip breathing x10 reps sitting on BSC after hygiene assist performed. Other Exercises: STS x 3 for BLE strengthening    General Comments General comments (skin integrity, edema, etc.): Notable bruising over areas where rib fxs are present, pt with small amount of blood on washcloth when PTA assisting her for peri-care, per NT she is "raw" in perineal area but difficult to see during session due to pt skin folds and standing posture, no open areas observed on her bottom or inner thighs, pt has sacral foam in place partially covering tailbone so not sure if blood is coming from under dressing or more from anterior/labial folds, NT assisting her with anterior hygiene/cleaning under front groin area and pannus and towel drying area while PTA present for balance safety as pt standing at RW. Pt performs some anterior hygiene during standing sink bath but due to fatigue/DOE staff assisting for completion of hygiene. When PTA arrived pt on toilet with nurse tech present and pt was on room air but dyspneic so  PTA placed pulse oximeter on her finger and it read 83%, so PTA brought portable O2 tank and kept her on 2L O2 [place] for remainder of session. SpO2 92% standing with 2L O2 [place] and improved to 96% and greater on 2L [place] in recliner. x2 poise pads in place in pt briefs before she transfers to chair per pt request as she c/o history of urinary urgency when taking water pills.      Pertinent Vitals/Pain Pain Assessment Pain Assessment: 0-10 Pain Score: 7  Pain Location: ribs  (L and R sides) Pain Descriptors / Indicators: Grimacing, Guarding, Aching, Sore, Tender Pain Intervention(s): Limited activity within patient's tolerance, Monitored during session, Repositioned, Patient requesting pain meds-RN notified, Other (comment) (pt has lidocaine patch in place but also requests PO meds, she last had Tylenol around 5:45AM per chart review.)    Home Living                          Prior Function            PT Goals (current goals can now be found in the care plan section) Acute Rehab PT Goals Patient Stated Goal: to reduce pain and restore independence when mobilizing PT Goal Formulation: With patient Time For Goal Achievement: 01/06/24 Progress towards PT goals: Progressing toward goals    Frequency    Min 2X/week      PT Plan      Co-evaluation              AM-PAC PT "6 Clicks" Mobility  Outcome Measure  Help needed turning from your back to your side while in a flat bed without using bedrails?: A Lot Help needed moving from lying on your back to sitting on the side of a flat bed without using bedrails?: A Lot Help needed moving to and from a bed to a chair (including a wheelchair)?: A Little Help needed standing up from a chair using your arms (e.g., wheelchair or bedside chair)?: A Little Help needed to walk in hospital room?: Total (<48ft) Help needed climbing 3-5 steps with a railing? : Total 6 Click Score: 12    End of Session Equipment Utilized During Treatment: Oxygen (defer gait belt 2/2 rib fxs and pt getting dressed, +2 present for safety while pt standing.) Activity Tolerance: Patient limited by fatigue;Patient tolerated treatment well Patient left: in chair;with call bell/phone within reach;with chair alarm set Nurse Communication: Mobility status;Other (comment) (pt) PT Visit Diagnosis: Other abnormalities of gait and mobility (R26.89);Muscle weakness (generalized) (M62.81);Pain Pain - part of body:  (ribs)      Time: 1113-1140 PT Time Calculation (min) (ACUTE ONLY): 27 min  Charges:    $Therapeutic Exercise: 8-22 mins $Therapeutic Activity: 8-22 mins PT General Charges $$ ACUTE PT VISIT: 1 Visit                     Chadley Dziedzic P., PTA Acute Rehabilitation Services Secure Chat Preferred 9a-5:30pm Office: [phone]    Dorathy Kinsman [facility] 12/29/2023, 12:09 PM

## 2023-12-29 NOTE — Plan of Care (Signed)
  Problem: Education: Goal: Ability to describe [place]-care measures that may prevent or decrease complications (Diabetes Survival Skills Education) will improve Outcome: Progressing   Problem: Coping: Goal: Ability to adjust to condition or change in health will improve Outcome: Progressing   Problem: Fluid Volume: Goal: Ability to maintain a balanced intake and output will improve Outcome: Progressing   Problem: Skin Integrity: Goal: Risk for impaired skin integrity will decrease Outcome: Progressing   Problem: Pain Managment: Goal: General experience of comfort will improve and/or be controlled Outcome: Progressing
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
## 2023-12-30 DIAGNOSIS — I83018 Varicose veins of right lower extremity with ulcer other part of lower leg: Secondary | ICD-10-CM | POA: Diagnosis not present

## 2023-12-30 DIAGNOSIS — E119 Type 2 diabetes mellitus without complications: Secondary | ICD-10-CM | POA: Diagnosis not present

## 2023-12-30 DIAGNOSIS — E785 Hyperlipidemia, unspecified: Secondary | ICD-10-CM | POA: Diagnosis not present

## 2023-12-30 DIAGNOSIS — R5381 Other malaise: Secondary | ICD-10-CM | POA: Diagnosis not present

## 2023-12-30 DIAGNOSIS — I10 Essential (primary) hypertension: Secondary | ICD-10-CM | POA: Diagnosis not present

## 2023-12-30 DIAGNOSIS — L24A9 Irritant contact dermatitis due friction or contact with other specified body fluids: Secondary | ICD-10-CM | POA: Diagnosis not present

## 2023-12-30 DIAGNOSIS — J9811 Atelectasis: Secondary | ICD-10-CM | POA: Diagnosis not present

## 2023-12-30 DIAGNOSIS — I509 Heart failure, unspecified: Secondary | ICD-10-CM | POA: Diagnosis not present

## 2023-12-31 ENCOUNTER — Ambulatory Visit (HOSPITAL_BASED_OUTPATIENT_CLINIC_OR_DEPARTMENT_OTHER): Payer: Medicare Other | Admitting: Internal Medicine

## 2023-12-31 DIAGNOSIS — M109 Gout, unspecified: Secondary | ICD-10-CM | POA: Diagnosis not present

## 2023-12-31 DIAGNOSIS — I480 Paroxysmal atrial fibrillation: Secondary | ICD-10-CM | POA: Diagnosis not present

## 2023-12-31 DIAGNOSIS — M1990 Unspecified osteoarthritis, unspecified site: Secondary | ICD-10-CM | POA: Diagnosis not present

## 2023-12-31 DIAGNOSIS — K5900 Constipation, unspecified: Secondary | ICD-10-CM | POA: Diagnosis not present

## 2023-12-31 DIAGNOSIS — R0602 Shortness of breath: Secondary | ICD-10-CM | POA: Diagnosis not present

## 2023-12-31 DIAGNOSIS — E876 Hypokalemia: Secondary | ICD-10-CM | POA: Diagnosis not present

## 2023-12-31 DIAGNOSIS — E785 Hyperlipidemia, unspecified: Secondary | ICD-10-CM | POA: Diagnosis not present

## 2023-12-31 DIAGNOSIS — I10 Essential (primary) hypertension: Secondary | ICD-10-CM | POA: Diagnosis not present

## 2023-12-31 DIAGNOSIS — I509 Heart failure, unspecified: Secondary | ICD-10-CM | POA: Diagnosis not present

## 2023-12-31 DIAGNOSIS — E119 Type 2 diabetes mellitus without complications: Secondary | ICD-10-CM | POA: Diagnosis not present

## 2023-12-31 DIAGNOSIS — D649 Anemia, unspecified: Secondary | ICD-10-CM | POA: Diagnosis not present

## 2023-12-31 DIAGNOSIS — S2249XA Multiple fractures of ribs, unspecified side, initial encounter for closed fracture: Secondary | ICD-10-CM | POA: Diagnosis not present

## 2024-01-01 ENCOUNTER — Other Ambulatory Visit: Payer: Self-pay | Admitting: Cardiology

## 2024-01-01 DIAGNOSIS — I480 Paroxysmal atrial fibrillation: Secondary | ICD-10-CM

## 2024-01-02 ENCOUNTER — Ambulatory Visit (HOSPITAL_COMMUNITY): Admitting: Internal Medicine

## 2024-01-02 NOTE — Telephone Encounter (Signed)
 Prescription refill request for Eliquis received. Indication:afib Last office visit:3/25 Scr:0.94  3/25 Age: 83 Weight:118  kg  Prescription refilled

## 2024-01-06 DIAGNOSIS — L24A9 Irritant contact dermatitis due friction or contact with other specified body fluids: Secondary | ICD-10-CM | POA: Diagnosis not present

## 2024-01-06 DIAGNOSIS — I83018 Varicose veins of right lower extremity with ulcer other part of lower leg: Secondary | ICD-10-CM | POA: Diagnosis not present

## 2024-01-07 ENCOUNTER — Ambulatory Visit (HOSPITAL_BASED_OUTPATIENT_CLINIC_OR_DEPARTMENT_OTHER): Payer: Medicare Other | Admitting: Internal Medicine

## 2024-01-07 DIAGNOSIS — R5381 Other malaise: Secondary | ICD-10-CM | POA: Diagnosis not present

## 2024-01-07 DIAGNOSIS — S2249XA Multiple fractures of ribs, unspecified side, initial encounter for closed fracture: Secondary | ICD-10-CM | POA: Diagnosis not present

## 2024-01-07 DIAGNOSIS — E119 Type 2 diabetes mellitus without complications: Secondary | ICD-10-CM | POA: Diagnosis not present

## 2024-01-07 DIAGNOSIS — E876 Hypokalemia: Secondary | ICD-10-CM | POA: Diagnosis not present

## 2024-01-07 DIAGNOSIS — I10 Essential (primary) hypertension: Secondary | ICD-10-CM | POA: Diagnosis not present

## 2024-01-07 DIAGNOSIS — I509 Heart failure, unspecified: Secondary | ICD-10-CM | POA: Diagnosis not present

## 2024-01-07 DIAGNOSIS — I480 Paroxysmal atrial fibrillation: Secondary | ICD-10-CM | POA: Diagnosis not present

## 2024-01-07 DIAGNOSIS — H3530 Unspecified macular degeneration: Secondary | ICD-10-CM | POA: Diagnosis not present

## 2024-01-07 DIAGNOSIS — R0989 Other specified symptoms and signs involving the circulatory and respiratory systems: Secondary | ICD-10-CM | POA: Diagnosis not present

## 2024-01-07 DIAGNOSIS — M1990 Unspecified osteoarthritis, unspecified site: Secondary | ICD-10-CM | POA: Diagnosis not present

## 2024-01-07 DIAGNOSIS — D649 Anemia, unspecified: Secondary | ICD-10-CM | POA: Diagnosis not present

## 2024-01-07 DIAGNOSIS — K5900 Constipation, unspecified: Secondary | ICD-10-CM | POA: Diagnosis not present

## 2024-01-13 DIAGNOSIS — I480 Paroxysmal atrial fibrillation: Secondary | ICD-10-CM | POA: Diagnosis not present

## 2024-01-13 DIAGNOSIS — L97811 Non-pressure chronic ulcer of other part of right lower leg limited to breakdown of skin: Secondary | ICD-10-CM | POA: Diagnosis not present

## 2024-01-13 DIAGNOSIS — I110 Hypertensive heart disease with heart failure: Secondary | ICD-10-CM | POA: Diagnosis not present

## 2024-01-13 DIAGNOSIS — I872 Venous insufficiency (chronic) (peripheral): Secondary | ICD-10-CM | POA: Diagnosis not present

## 2024-01-13 DIAGNOSIS — E119 Type 2 diabetes mellitus without complications: Secondary | ICD-10-CM | POA: Diagnosis not present

## 2024-01-14 ENCOUNTER — Encounter (INDEPENDENT_AMBULATORY_CARE_PROVIDER_SITE_OTHER): Admitting: Ophthalmology

## 2024-01-15 DIAGNOSIS — I872 Venous insufficiency (chronic) (peripheral): Secondary | ICD-10-CM | POA: Diagnosis not present

## 2024-01-15 DIAGNOSIS — I480 Paroxysmal atrial fibrillation: Secondary | ICD-10-CM | POA: Diagnosis not present

## 2024-01-15 DIAGNOSIS — E119 Type 2 diabetes mellitus without complications: Secondary | ICD-10-CM | POA: Diagnosis not present

## 2024-01-15 DIAGNOSIS — I110 Hypertensive heart disease with heart failure: Secondary | ICD-10-CM | POA: Diagnosis not present

## 2024-01-15 DIAGNOSIS — L97811 Non-pressure chronic ulcer of other part of right lower leg limited to breakdown of skin: Secondary | ICD-10-CM | POA: Diagnosis not present

## 2024-01-20 DIAGNOSIS — E1165 Type 2 diabetes mellitus with hyperglycemia: Secondary | ICD-10-CM | POA: Diagnosis not present

## 2024-01-20 DIAGNOSIS — I872 Venous insufficiency (chronic) (peripheral): Secondary | ICD-10-CM | POA: Diagnosis not present

## 2024-01-20 DIAGNOSIS — I482 Chronic atrial fibrillation, unspecified: Secondary | ICD-10-CM | POA: Diagnosis not present

## 2024-01-20 DIAGNOSIS — E782 Mixed hyperlipidemia: Secondary | ICD-10-CM | POA: Diagnosis not present

## 2024-01-20 DIAGNOSIS — I110 Hypertensive heart disease with heart failure: Secondary | ICD-10-CM | POA: Diagnosis not present

## 2024-01-20 DIAGNOSIS — L97811 Non-pressure chronic ulcer of other part of right lower leg limited to breakdown of skin: Secondary | ICD-10-CM | POA: Diagnosis not present

## 2024-01-20 DIAGNOSIS — E119 Type 2 diabetes mellitus without complications: Secondary | ICD-10-CM | POA: Diagnosis not present

## 2024-01-20 DIAGNOSIS — I10 Essential (primary) hypertension: Secondary | ICD-10-CM | POA: Diagnosis not present

## 2024-01-20 DIAGNOSIS — I480 Paroxysmal atrial fibrillation: Secondary | ICD-10-CM | POA: Diagnosis not present

## 2024-01-20 DIAGNOSIS — S81801D Unspecified open wound, right lower leg, subsequent encounter: Secondary | ICD-10-CM | POA: Diagnosis not present

## 2024-01-20 DIAGNOSIS — S2243XD Multiple fractures of ribs, bilateral, subsequent encounter for fracture with routine healing: Secondary | ICD-10-CM | POA: Diagnosis not present

## 2024-01-21 DIAGNOSIS — I110 Hypertensive heart disease with heart failure: Secondary | ICD-10-CM | POA: Diagnosis not present

## 2024-01-21 DIAGNOSIS — E119 Type 2 diabetes mellitus without complications: Secondary | ICD-10-CM | POA: Diagnosis not present

## 2024-01-21 DIAGNOSIS — I480 Paroxysmal atrial fibrillation: Secondary | ICD-10-CM | POA: Diagnosis not present

## 2024-01-21 DIAGNOSIS — I872 Venous insufficiency (chronic) (peripheral): Secondary | ICD-10-CM | POA: Diagnosis not present

## 2024-01-21 DIAGNOSIS — L97811 Non-pressure chronic ulcer of other part of right lower leg limited to breakdown of skin: Secondary | ICD-10-CM | POA: Diagnosis not present

## 2024-01-22 DIAGNOSIS — I110 Hypertensive heart disease with heart failure: Secondary | ICD-10-CM | POA: Diagnosis not present

## 2024-01-22 DIAGNOSIS — I872 Venous insufficiency (chronic) (peripheral): Secondary | ICD-10-CM | POA: Diagnosis not present

## 2024-01-22 DIAGNOSIS — E119 Type 2 diabetes mellitus without complications: Secondary | ICD-10-CM | POA: Diagnosis not present

## 2024-01-22 DIAGNOSIS — I480 Paroxysmal atrial fibrillation: Secondary | ICD-10-CM | POA: Diagnosis not present

## 2024-01-22 DIAGNOSIS — L97811 Non-pressure chronic ulcer of other part of right lower leg limited to breakdown of skin: Secondary | ICD-10-CM | POA: Diagnosis not present

## 2024-01-23 ENCOUNTER — Ambulatory Visit (HOSPITAL_COMMUNITY)
Admission: RE | Admit: 2024-01-23 | Discharge: 2024-01-23 | Disposition: A | Discharge status: Home / Self Care | Source: Ambulatory Visit | Attending: Internal Medicine | Admitting: Internal Medicine

## 2024-01-23 ENCOUNTER — Encounter (HOSPITAL_COMMUNITY): Payer: Self-pay | Admitting: Internal Medicine

## 2024-01-23 VITALS — BP 116/80 | HR 128 | Ht 63.0 in | Wt 245.6 lb

## 2024-01-23 DIAGNOSIS — I4891 Unspecified atrial fibrillation: Secondary | ICD-10-CM | POA: Diagnosis present

## 2024-01-23 DIAGNOSIS — K219 Gastro-esophageal reflux disease without esophagitis: Secondary | ICD-10-CM | POA: Diagnosis not present

## 2024-01-23 DIAGNOSIS — I10 Essential (primary) hypertension: Secondary | ICD-10-CM | POA: Insufficient documentation

## 2024-01-23 DIAGNOSIS — Z86718 Personal history of other venous thrombosis and embolism: Secondary | ICD-10-CM | POA: Insufficient documentation

## 2024-01-23 DIAGNOSIS — I480 Paroxysmal atrial fibrillation: Secondary | ICD-10-CM | POA: Diagnosis not present

## 2024-01-23 DIAGNOSIS — I4819 Other persistent atrial fibrillation: Secondary | ICD-10-CM | POA: Diagnosis not present

## 2024-01-23 DIAGNOSIS — Z79899 Other long term (current) drug therapy: Secondary | ICD-10-CM | POA: Diagnosis not present

## 2024-01-23 DIAGNOSIS — E669 Obesity, unspecified: Secondary | ICD-10-CM | POA: Insufficient documentation

## 2024-01-23 DIAGNOSIS — G4733 Obstructive sleep apnea (adult) (pediatric): Secondary | ICD-10-CM | POA: Diagnosis not present

## 2024-01-23 DIAGNOSIS — D6869 Other thrombophilia: Secondary | ICD-10-CM | POA: Insufficient documentation

## 2024-01-23 DIAGNOSIS — Z5181 Encounter for therapeutic drug level monitoring: Secondary | ICD-10-CM

## 2024-01-23 DIAGNOSIS — Z7901 Long term (current) use of anticoagulants: Secondary | ICD-10-CM | POA: Insufficient documentation

## 2024-01-23 DIAGNOSIS — E119 Type 2 diabetes mellitus without complications: Secondary | ICD-10-CM | POA: Diagnosis not present

## 2024-01-23 DIAGNOSIS — D649 Anemia, unspecified: Secondary | ICD-10-CM | POA: Insufficient documentation

## 2024-01-23 DIAGNOSIS — I7121 Aneurysm of the ascending aorta, without rupture: Secondary | ICD-10-CM | POA: Insufficient documentation

## 2024-01-23 LAB — BASIC METABOLIC PANEL WITH GFR
Anion gap: 12 (ref 5–15)
BUN: 27 mg/dL — ABNORMAL HIGH (ref 8–23)
CO2: 26 mmol/L (ref 22–32)
Calcium: 9.6 mg/dL (ref 8.9–10.3)
Chloride: 101 mmol/L (ref 98–111)
Creatinine, Ser: 0.69 mg/dL (ref 0.44–1.00)
GFR, Estimated: >60 mL/min (ref >60)
Glucose, Bld: 127 mg/dL — ABNORMAL HIGH (ref 70–99)
Potassium: 4.1 mmol/L (ref 3.5–5.1)
Sodium: 139 mmol/L (ref 135–145)

## 2024-01-23 MED ORDER — METOPROLOL SUCCINATE ER 25 MG PO TB24
25.0000 mg | ORAL_TABLET | Freq: Every day | ORAL | 3 refills | Status: DC
Start: 2024-01-23 — End: 2024-02-13

## 2024-01-23 NOTE — Progress Notes (Addendum)
 Primary Care Physician: Karalee Oscar, PA Referring Physician: Same Primary cardiologist: Dr. Audery Blazing Primary EP: formerly Dr Belita Bowling Natasha Glass is a 83 y.o. 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 Sherene Dilling,  DM II, OSA with CPAP, arthritis, DVT, gerd, obesity, anemia, HTN, chronic LLE, that presented to PCP recently  with not feeling well  for a couple of weeks. She was found to be in afib with RVR. Her BB dose was increased and ARB stopped, she was started on xarelto  with a chadsvasc score of at least 5.  On initial visit, 6/25,  Ekg showed HR reasonably controlled at 103 bpm. BP soft at 100/58. She  has complaints of fatigue/lightheadedness. She has been able to lose 36 lbs by following a low carb diet over the last several months.Last echo in 2018 with normal EF.  F/u in Afib clinic, 12/16/23. She is currently in Afib. Seen by Cardiology on 12/08/23 and noted to be in Afib with RVR. Patient noted she was told by PCP the week prior she had elevated HR. No missed doses of Tikosyn  250 mcg BID or Eliquis  5 mg BID. She is seeing wound care for a sore on her right leg.   F/u in Afib clinic, 01/23/24. She is currently in Afib. She underwent DCCV on 12/19/23 and had 3 attempts at cardioversion. After last attempt at 360J she had a transient sinus rhythm with long pause/transient asystole. She received ~3-5 seconds of CPR and then was noted to be in atrial tachycardia. She went to the ED on 3/9 for worsening chest pain and SOB with multiple rib fractures likely secondary to CPR and was discharged on 3/17. She remains on Tikosyn  250 mcg BID and Toprol  12.5 mg at bedtime.   Today, she denies symptoms of palpitations, chest pain, shortness of breath, orthopnea, PND, lower extremity edema, dizziness, presyncope, syncope, or neurologic sequela. The patient is tolerating medications without difficulties and is otherwise without complaint today.   Past Medical History:   Diagnosis Date   Anemia    Arthritis    CHF (congestive heart failure) (HCC)    DVT (deep venous thrombosis) (HCC) 01/2009   Dysrhythmia    GERD (gastroesophageal reflux disease)    Gout    History of blood transfusion    1990's   History of colonic polyps 09/2010   History of pneumonia    Hyperlipidemia    Hypertension    Lactose intolerance    Macular degeneration, dry    bilateral   Migraine    Obesity    Osteopenia    PAF (paroxysmal atrial fibrillation) (HCC)    Peripheral neuropathy    Pulmonary embolism (HCC) 01/2009   Sleep apnea    CPAP pressure 4.0-16.0   Thoracic ascending aortic aneurysm (HCC)    Type II diabetes mellitus (HCC)    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)    Urinary incontinence    Past Surgical History:  Procedure Laterality Date   ABDOMINAL EXPLORATION SURGERY  1960   ABDOMINAL HYSTERECTOMY  1986   CARDIAC CATHETERIZATION     CARDIOVERSION N/A 05/07/2018   Procedure: CARDIOVERSION;  Surgeon: Loyde Rule, MD;  Location[facility] ENDOSCOPY;  Service: Cardiovascular;  Laterality: N/A;   CARDIOVERSION N/A 12/19/2023   Procedure: CARDIOVERSION;  Surgeon: Lenise Quince, MD;  Location: [facility] INVASIVE CV LAB;  Service: Cardiovascular;  Laterality: N/A;   COLONOSCOPY W/ BIOPSIES AND POLYPECTOMY  2005; 2011; 2012   COLONOSCOPY  WITH PROPOFOL  N/A 09/10/2016   Procedure: COLONOSCOPY WITH PROPOFOL ;  Surgeon: Garrett Kallman, MD;  Location: [facility] ENDOSCOPY;  Service: Endoscopy;  Laterality: N/A;   JOINT REPLACEMENT     KNEE ARTHROSCOPY Left ~ 1999   SHOULDER OPEN ROTATOR CUFF REPAIR Left 2011   TEE WITHOUT CARDIOVERSION N/A 08/13/2022   Procedure: TRANSESOPHAGEAL ECHOCARDIOGRAM (TEE);  Surgeon: Jacqueline Matsu, MD;  Location: [facility] ENDOSCOPY;  Service: Cardiovascular;  Laterality: N/A;   TOTAL KNEE ARTHROPLASTY Bilateral 2005-2012   left-right    Current Outpatient Medications  Medication Sig Dispense Refill   acetaminophen  (TYLENOL ) 500 MG tablet  Take 2 tablets (1,000 mg total) by mouth every 6 (six) hours as needed for mild pain (pain score 1-3), headache or fever. 30 tablet 0   albuterol  (VENTOLIN  HFA) 108 (90 Base) MCG/ACT inhaler Inhale 2 puffs into the lungs every 6 (six) hours as needed for wheezing or shortness of breath. 6.7 g 0   allopurinol  (ZYLOPRIM ) 300 MG tablet Take 0.5 tablets (150 mg total) by mouth every evening. 15 tablet 0   Cholecalciferol (VITAMIN D3) 25 MCG (1000 UT) CAPS Take 2,000 Units by mouth in the morning.     ciprofloxacin (CILOXAN) 0.3 % ophthalmic solution Place 1 drop into both eyes See admin instructions. First day- 1 drop 4 times daily x 2 days for eye injection     dofetilide  (TIKOSYN ) 250 MCG capsule Take 1 capsule (250 mcg total) by mouth 2 (two) times daily. 180 capsule 1   ELIQUIS  5 MG TABS tablet TAKE 1 TABLET BY MOUTH TWICE  DAILY 200 tablet 2   furosemide  (LASIX ) 40 MG tablet 1 tablet by mouth once daily, may take extra 1.5 tablets as needed for swelling (Patient taking differently: Take 60 mg by mouth daily. 1 tablet by mouth once daily, may take extra 1.5 tablets as needed for swelling) 90 tablet 3   HYDROcodone -acetaminophen  (NORCO/VICODIN) 5-325 MG tablet Take 1 tablet by mouth every 4 (four) hours as needed for moderate pain (pain score 4-6). 30 tablet 0   Hyprom-Naphaz-Polysorb-Zn Sulf (CLEAR EYES COMPLETE) SOLN Place 1 drop into both eyes as needed (itching).     lidocaine  (LIDODERM ) 5 % Place 1 patch onto the skin daily. Remove & Discard patch within 12 hours or as directed by MD     losartan  (COZAAR ) 25 MG tablet Take 1 tablet (25 mg total) by mouth daily. 30 tablet 0   Magnesium  250 MG TABS Take 250 mg by mouth at bedtime.     metFORMIN  (GLUCOPHAGE ) 500 MG tablet Take 1 tablet (500 mg total) by mouth 2 (two) times daily with a meal. 60 tablet 0   Multiple Vitamins-Minerals (PRESERVISION AREDS 2) CAPS Take 2 capsules by mouth daily with lunch.     polyethylene glycol (MIRALAX  / GLYCOLAX ) 17 g  packet Take 17 g by mouth daily.     potassium chloride  SA (KLOR-CON  M) 20 MEQ tablet Take 1 tablet (20 mEq total) by mouth in the morning.     rosuvastatin  (CRESTOR ) 5 MG tablet Take 5 mg by mouth in the morning.     senna-docusate (SENOKOT-S) 8.6-50 MG tablet Take 1 tablet by mouth 2 (two) times daily.     vitamin B-12 (CYANOCOBALAMIN ) 500 MCG tablet Take 500 mcg by mouth in the morning.     metoprolol  succinate (TOPROL -XL) 25 MG 24 hr tablet Take 1 tablet (25 mg total) by mouth at bedtime. 30 tablet 3   No current facility-administered medications for this  encounter.    Allergies  Allergen Reactions   Bactrim [Sulfamethoxazole-Trimethoprim]     Drop in BP   Morphine And Codeine Other (See Comments)    Unresponsive-Very bad reaction.  Can take hydrocodone .    Oysters [Shellfish Allergy] Shortness Of Breath, Itching, Swelling and Rash   Amlodipine Swelling and Other (See Comments)    Of legs   Percocet [Oxycodone-Acetaminophen ] Other (See Comments)    hallucinations   Tdap [Tetanus-Diphth-Acell Pertussis] Itching, Swelling and Rash   Tetanus Toxoids Itching, Swelling and Rash   Tramadol Swelling and Other (See Comments)    Of legs   Lactose Intolerance (Gi) Other (See Comments)   Other Other (See Comments)   Tegretol [Carbamazepine] Other (See Comments)    unknown   Tetanus-Diphtheria Toxoids Td Other (See Comments)   Topiramate Other (See Comments)   Diovan [Valsartan] Other (See Comments)    Cough   Diphtheria Toxoid Itching, Swelling and Rash   Macrodantin [Nitrofurantoin Macrocrystal] Other (See Comments)    unspecified   ROS- All systems are reviewed and negative except as per the HPI above  Physical Exam: Vitals:   01/23/24 1012  BP: 116/80  Pulse: (!) 128  Weight: 111.4 kg  Height: 5\' 3"  (1.6 m)    Wt Readings from Last 3 Encounters:  01/23/24 111.4 kg  12/21/23 118 kg  12/16/23 118 kg    Labs: Lab Results  Component Value Date   NA 135 12/24/2023   K  4.0 12/24/2023   CL 103 12/24/2023   CO2 25 12/24/2023   GLUCOSE 117 (H) 12/24/2023   BUN 30 (H) 12/24/2023   CREATININE 0.94 12/24/2023   CALCIUM  8.8 (L) 12/24/2023   MG 2.1 12/24/2023   Lab Results  Component Value Date   INR 3.6 (H) 05/23/2023   Lab Results  Component Value Date   CHOL  02/07/2009    136        ATP III CLASSIFICATION:  <200     mg/dL   Desirable  161-096  mg/dL   Borderline High  >=045    mg/dL   High          HDL 45 02/07/2009   LDLCALC  02/07/2009    74        Total Cholesterol/HDL:CHD Risk Coronary Heart Disease Risk Table                     Men   Women  1/2 Average Risk   3.4   3.3  Average Risk       5.0   4.4  2 X Average Risk   9.6   7.1  3 X Average Risk  23.4   11.0        Use the calculated Patient Ratio above and the CHD Risk Table to determine the patient's CHD Risk.        ATP III CLASSIFICATION (LDL):  <100     mg/dL   Optimal  409-811  mg/dL   Near or Above                    Optimal  130-159  mg/dL   Borderline  914-782  mg/dL   High  >956     mg/dL   Very High   TRIG 84 21/30/8657    GEN- The patient is well appearing, alert and oriented x 3 today.   Neck - no JVD or carotid bruit noted Lungs- Clear to ausculation bilaterally,  normal work of breathing Heart- Irregular tachycardic rate and rhythm, no murmurs, rubs or gallops, PMI not laterally displaced Extremities- no clubbing, cyanosis, or edema Skin - no rash or ecchymosis noted   EKG-   Vent. rate 128 BPM PR interval * ms QRS duration 80 ms QT/QTcB 338/493 ms P-R-Natasha axes * -17 12 Atrial fibrillation with rapid ventricular response Low voltage QRS Cannot rule out Anterior infarct , age undetermined Abnormal ECG When compared with ECG of 21-Dec-2023 23:51, PREVIOUS ECG IS PRESENT  Echo TEE 08/13/22  1. Left ventricular ejection fraction, by estimation, is 60 to 65%. The  left ventricle has normal function. The left ventricle has no regional  wall motion  abnormalities.   2. Right ventricular systolic function is normal. The right ventricular  size is normal.   3. Left atrial size was moderately dilated. No left atrial/left atrial  appendage thrombus was detected. The LAA emptying velocity was 43 cm/s.   4. The mitral valve is normal in structure. Mild mitral valve  regurgitation. No evidence of mitral stenosis.   5. The aortic valve is tricuspid. Aortic valve regurgitation is mild.  Aortic valve sclerosis/calcification is present, without any evidence of  aortic stenosis.   6. There is mild (Grade II) layered plaque involving the ascending aorta.   7. The inferior vena cava is normal in size with greater than 50%  respiratory variability, suggesting right atrial pressure of 3 mmHg.   Conclusion(s)/Recommendation(s): Normal biventricular function without  evidence of hemodynamically significant valvular heart disease. That  patient converted spontaneously to NSR prior to attempt at cardioversion.    Assessment and Plan:  1. Persistent atrial fibrillation S/p unsuccessful DCCV on 12/19/23 complicated by asystole and several seconds of CPR.  She is currently in Afib. After patient's most recent experience with DCCV, she states she does not want to do a cardioversion again. We discussed option for treatment including amiodarone, but patient declines repeat DCCV at this time so unsure if would consider amiodarone for rate control. For now, will continue Tikosyn  until I have spoken with EP about stopping it as a medication failure. Increase Toprol  to 25 mg at bedtime. Bmet drawn today to recheck creatinine and potassium after lasix  increased to 40 mg daily (60 mg prn). Will help patient establish with EP as she is a former patient of Dr. Nunzio Belch. If patient is not a candidate for AAD therapy, then may be consideration for AV nodal ablation.   Addendum: After discussion with Dr. Daneil Dunker, advised patient to discontinue Tikosyn  due to it no longer  helping to maintain normal rhythm. Patient was advised to stop medication.   High risk medication monitoring (ICD10: [id number]) Patient requires ongoing monitoring for anti-arrhythmic medication which has the potential to cause life threatening arrhythmias or AV block. Qtc stable. Continue Tikosyn  250 mcg BID.   2. Secondary hypercoagulable state due to atrial fibrillation This patients CHA2DS2-VASc Score is 7.2 % stroke rate/year from a score of 5 Continue Eliquis  5 mg BID without interruption.     Establish with EP.    Minnie Amber, PA-C Afib Clinic [facility] [address] [place], [place] [address] [phone]

## 2024-01-23 NOTE — Addendum Note (Signed)
 Encounter addended by: Shona Simpson, RN on: 01/23/2024 3:19 PM  Actions taken: Medication long-term status modified, Order list changed

## 2024-01-23 NOTE — Patient Instructions (Signed)
Increase metoprolol to 25mg once a day °

## 2024-01-26 DIAGNOSIS — I480 Paroxysmal atrial fibrillation: Secondary | ICD-10-CM | POA: Diagnosis not present

## 2024-01-26 DIAGNOSIS — L97811 Non-pressure chronic ulcer of other part of right lower leg limited to breakdown of skin: Secondary | ICD-10-CM | POA: Diagnosis not present

## 2024-01-26 DIAGNOSIS — E119 Type 2 diabetes mellitus without complications: Secondary | ICD-10-CM | POA: Diagnosis not present

## 2024-01-26 DIAGNOSIS — I872 Venous insufficiency (chronic) (peripheral): Secondary | ICD-10-CM | POA: Diagnosis not present

## 2024-01-26 DIAGNOSIS — I110 Hypertensive heart disease with heart failure: Secondary | ICD-10-CM | POA: Diagnosis not present

## 2024-01-29 DIAGNOSIS — L97811 Non-pressure chronic ulcer of other part of right lower leg limited to breakdown of skin: Secondary | ICD-10-CM | POA: Diagnosis not present

## 2024-01-29 DIAGNOSIS — I872 Venous insufficiency (chronic) (peripheral): Secondary | ICD-10-CM | POA: Diagnosis not present

## 2024-01-29 DIAGNOSIS — E119 Type 2 diabetes mellitus without complications: Secondary | ICD-10-CM | POA: Diagnosis not present

## 2024-01-29 DIAGNOSIS — I480 Paroxysmal atrial fibrillation: Secondary | ICD-10-CM | POA: Diagnosis not present

## 2024-01-29 DIAGNOSIS — I110 Hypertensive heart disease with heart failure: Secondary | ICD-10-CM | POA: Diagnosis not present

## 2024-02-02 DIAGNOSIS — I110 Hypertensive heart disease with heart failure: Secondary | ICD-10-CM | POA: Diagnosis not present

## 2024-02-02 DIAGNOSIS — E119 Type 2 diabetes mellitus without complications: Secondary | ICD-10-CM | POA: Diagnosis not present

## 2024-02-02 DIAGNOSIS — I872 Venous insufficiency (chronic) (peripheral): Secondary | ICD-10-CM | POA: Diagnosis not present

## 2024-02-02 DIAGNOSIS — L97811 Non-pressure chronic ulcer of other part of right lower leg limited to breakdown of skin: Secondary | ICD-10-CM | POA: Diagnosis not present

## 2024-02-02 DIAGNOSIS — I480 Paroxysmal atrial fibrillation: Secondary | ICD-10-CM | POA: Diagnosis not present

## 2024-02-02 NOTE — Addendum Note (Signed)
 Encounter addended by: Nathanel Bal, PA-C on: 02/02/2024 9:30 AM  Actions taken: Clinical Note Signed

## 2024-02-03 DIAGNOSIS — E119 Type 2 diabetes mellitus without complications: Secondary | ICD-10-CM | POA: Diagnosis not present

## 2024-02-03 DIAGNOSIS — I480 Paroxysmal atrial fibrillation: Secondary | ICD-10-CM | POA: Diagnosis not present

## 2024-02-03 DIAGNOSIS — L97811 Non-pressure chronic ulcer of other part of right lower leg limited to breakdown of skin: Secondary | ICD-10-CM | POA: Diagnosis not present

## 2024-02-03 DIAGNOSIS — I110 Hypertensive heart disease with heart failure: Secondary | ICD-10-CM | POA: Diagnosis not present

## 2024-02-03 DIAGNOSIS — I872 Venous insufficiency (chronic) (peripheral): Secondary | ICD-10-CM | POA: Diagnosis not present

## 2024-02-05 ENCOUNTER — Ambulatory Visit: Payer: Medicare Other | Admitting: Podiatry

## 2024-02-05 DIAGNOSIS — L97811 Non-pressure chronic ulcer of other part of right lower leg limited to breakdown of skin: Secondary | ICD-10-CM | POA: Diagnosis not present

## 2024-02-05 DIAGNOSIS — I110 Hypertensive heart disease with heart failure: Secondary | ICD-10-CM | POA: Diagnosis not present

## 2024-02-05 DIAGNOSIS — E119 Type 2 diabetes mellitus without complications: Secondary | ICD-10-CM | POA: Diagnosis not present

## 2024-02-05 DIAGNOSIS — I480 Paroxysmal atrial fibrillation: Secondary | ICD-10-CM | POA: Diagnosis not present

## 2024-02-05 DIAGNOSIS — I872 Venous insufficiency (chronic) (peripheral): Secondary | ICD-10-CM | POA: Diagnosis not present

## 2024-02-06 ENCOUNTER — Encounter (INDEPENDENT_AMBULATORY_CARE_PROVIDER_SITE_OTHER): Admitting: Ophthalmology

## 2024-02-06 DIAGNOSIS — H353112 Nonexudative age-related macular degeneration, right eye, intermediate dry stage: Secondary | ICD-10-CM

## 2024-02-06 DIAGNOSIS — I10 Essential (primary) hypertension: Secondary | ICD-10-CM

## 2024-02-06 DIAGNOSIS — H43813 Vitreous degeneration, bilateral: Secondary | ICD-10-CM | POA: Diagnosis not present

## 2024-02-06 DIAGNOSIS — H35033 Hypertensive retinopathy, bilateral: Secondary | ICD-10-CM

## 2024-02-06 DIAGNOSIS — H35372 Puckering of macula, left eye: Secondary | ICD-10-CM | POA: Diagnosis not present

## 2024-02-06 DIAGNOSIS — H353221 Exudative age-related macular degeneration, left eye, with active choroidal neovascularization: Secondary | ICD-10-CM | POA: Diagnosis not present

## 2024-02-09 DIAGNOSIS — I110 Hypertensive heart disease with heart failure: Secondary | ICD-10-CM | POA: Diagnosis not present

## 2024-02-09 DIAGNOSIS — E119 Type 2 diabetes mellitus without complications: Secondary | ICD-10-CM | POA: Diagnosis not present

## 2024-02-09 DIAGNOSIS — I872 Venous insufficiency (chronic) (peripheral): Secondary | ICD-10-CM | POA: Diagnosis not present

## 2024-02-09 DIAGNOSIS — L97811 Non-pressure chronic ulcer of other part of right lower leg limited to breakdown of skin: Secondary | ICD-10-CM | POA: Diagnosis not present

## 2024-02-09 DIAGNOSIS — I480 Paroxysmal atrial fibrillation: Secondary | ICD-10-CM | POA: Diagnosis not present

## 2024-02-12 DIAGNOSIS — I872 Venous insufficiency (chronic) (peripheral): Secondary | ICD-10-CM | POA: Diagnosis not present

## 2024-02-12 DIAGNOSIS — I480 Paroxysmal atrial fibrillation: Secondary | ICD-10-CM | POA: Diagnosis not present

## 2024-02-12 DIAGNOSIS — E119 Type 2 diabetes mellitus without complications: Secondary | ICD-10-CM | POA: Diagnosis not present

## 2024-02-12 DIAGNOSIS — L97811 Non-pressure chronic ulcer of other part of right lower leg limited to breakdown of skin: Secondary | ICD-10-CM | POA: Diagnosis not present

## 2024-02-12 DIAGNOSIS — I110 Hypertensive heart disease with heart failure: Secondary | ICD-10-CM | POA: Diagnosis not present

## 2024-02-13 ENCOUNTER — Encounter: Payer: Self-pay | Admitting: Cardiovascular Disease

## 2024-02-13 ENCOUNTER — Ambulatory Visit: Attending: Cardiovascular Disease | Admitting: Cardiovascular Disease

## 2024-02-13 VITALS — BP 128/70 | HR 134 | Ht 63.0 in | Wt 264.0 lb

## 2024-02-13 DIAGNOSIS — I480 Paroxysmal atrial fibrillation: Secondary | ICD-10-CM | POA: Diagnosis not present

## 2024-02-13 DIAGNOSIS — I10 Essential (primary) hypertension: Secondary | ICD-10-CM | POA: Diagnosis not present

## 2024-02-13 DIAGNOSIS — I110 Hypertensive heart disease with heart failure: Secondary | ICD-10-CM | POA: Diagnosis not present

## 2024-02-13 DIAGNOSIS — I872 Venous insufficiency (chronic) (peripheral): Secondary | ICD-10-CM | POA: Diagnosis not present

## 2024-02-13 DIAGNOSIS — E119 Type 2 diabetes mellitus without complications: Secondary | ICD-10-CM | POA: Diagnosis not present

## 2024-02-13 DIAGNOSIS — L97811 Non-pressure chronic ulcer of other part of right lower leg limited to breakdown of skin: Secondary | ICD-10-CM | POA: Diagnosis not present

## 2024-02-13 MED ORDER — METOPROLOL SUCCINATE ER 50 MG PO TB24
50.0000 mg | ORAL_TABLET | Freq: Every day | ORAL | 6 refills | Status: DC
Start: 2024-02-13 — End: 2024-02-27

## 2024-02-13 NOTE — Patient Instructions (Addendum)
 Medication Instructions:   Increase Metoprolol  to 50mg  daily  Continue all current medications.   Labwork:  none  Testing/Procedures:  none  Follow-Up:  Afib clinic 2 weeks   Any Other Special Instructions Will Be Listed Below (If Applicable).   If you need a refill on your cardiac medications before your next appointment, please call your pharmacy.

## 2024-02-13 NOTE — Progress Notes (Signed)
 Electrophysiology Office Note:    Date:  02/13/2024   ID:  Natasha Glass, DOB 1940-10-16, MRN [id number]  PCP:  Karalee Oscar, PA   [facility] Providers Cardiologist:  Alexandria Angel, MD Electrophysiologist:  Jolly Needle, MD (Inactive)     Referring MD: Nathanel Bal, PA-C   History of Present Illness:    Natasha Glass is a 83 y.o. female with a medical history significant for persistent atrial fibrillation, ascending aortic aneurysm at 4.5 cm, type 2 diabetes, obstructive sleep apnea on CPAP, obesity, anemia, chronic lower extremity edema, referred for atrial fibrillation management.      Discussed the use of AI scribe software for clinical note transcription with the patient, who gave verbal consent to proceed.  History of Present Illness Natasha Glass is an 83 year old female with atrial fibrillation who presents with recent episodes of atrial fibrillation with rapid ventricular response and associated symptoms.  She has had atrial fibrillation since 2019, she recalls. She was referred to the AFib clinic at [facility] where she was started on Tikosyn , which initially managed her condition.  In February 2025, she was noted to be in atrial fibrillation with rapid ventricular response, experiencing symptoms of fatigue. Despite adherence to Tikosyn , she underwent attempted cardioversion in March 2025, which required three attempts. During the final attempt, she experienced a long pause and transient asystole, necessitating brief CPR, after which she converted to atrial tachycardia.  On December 21, 2023, she presented to the ER with chest pain and was found to have multiple rib fractures secondary to CPR.  She was also hypoxic, likely due to difficulty breathing from rib fractures, and was back in atrial fibrillation with RVR despite Tikosyn . She was discharged to skilled nursing care.  In follow-up at the AFib clinic, Tikosyn  was discontinued due to its failure to maintain  normal rhythm. She experiences ongoing symptoms of fatigue but is uncertain if they have worsened since being in atrial fibrillation.  Socially, she lives alone but is regularly checked on by her three children, who call her multiple times a day. She also has a supportive church community.         Today, she reports that she is at baseline.  She likely has some fatigue and shortness of breath, but this is normal for her, and she is able to complete her ADLs and live independently at present.  EKGs/Labs/Other Studies Reviewed Today:     Echocardiogram:  TTE May 01, 2022 EF 70 to 75%.  Mildly dilated left atrium.    EKG:   EKG Interpretation Date/Time:  Friday Feb 13 2024 09:55:10 EDT Ventricular Rate:  135 PR Interval:    QRS Duration:  82 QT Interval:  308 QTC Calculation: 462 R Axis:   -35  Text Interpretation: Atrial fibrillation Low voltage QRS Cannot rule out Anterior infarct (cited on or before 23-Jan-2024) When compared with ECG of 23-Jan-2024 10:14, No significant change was found Confirmed by Marlane Silver [phone] on 02/13/2024 10:12:35 AM     Physical Exam:    VS:  BP 128/70   Pulse (!) 134   Ht 5\' 3"  (1.6 m)   Wt 264 lb (119.7 kg)   SpO2 99%   BMI 46.77 kg/m     Wt Readings from Last 3 Encounters:  02/13/24 264 lb (119.7 kg)  01/23/24 245 lb 9.6 oz (111.4 kg)  12/21/23 260 lb 3.2 oz (118 kg)     GEN: Well nourished, well developed in  no acute distress CARDIAC: Tachycardic, irregular rhythm, no murmurs, rubs, gallops RESPIRATORY:  Normal work of breathing MUSCULOSKELETAL: no edema    ASSESSMENT & PLAN:     Persistent atrial fibrillation --now permanent Failed Tikosyn  She does not want to undergo another cardioversion; I think attempts at rhythm control would be futile She is functional in atrial fibrillation though rates are currently poorly controlled There is room to increase metoprolol  I think there is a low chance for spontaneous  conversion to sinus rhythm --particularly, given multiple failed cardioversions Will increase metoprolol  XL to 50 mg daily Follow-up in A-fib clinic in 2 weeks for further titration of rate control; brief monitor may be placed at that time to assess rates at home Failing medical rate control, she may be a candidate for pacemaker and AVJ ablation though I would like to avoid this given her age and frailty  Tachybradycardia syndrome Experienced asystole after cardioversion requiring CPR I think any attempt at rhythm control would require placement of a pacemaker  Secondary hypercoagulable state CHA2DS2-VASc score is 5 Continue Eliquis  5 mg twice daily     Signed, Efraim Grange, MD  02/13/2024 10:26 AM    [facility]

## 2024-02-17 DIAGNOSIS — L97811 Non-pressure chronic ulcer of other part of right lower leg limited to breakdown of skin: Secondary | ICD-10-CM | POA: Diagnosis not present

## 2024-02-17 DIAGNOSIS — I872 Venous insufficiency (chronic) (peripheral): Secondary | ICD-10-CM | POA: Diagnosis not present

## 2024-02-17 DIAGNOSIS — E119 Type 2 diabetes mellitus without complications: Secondary | ICD-10-CM | POA: Diagnosis not present

## 2024-02-17 DIAGNOSIS — I480 Paroxysmal atrial fibrillation: Secondary | ICD-10-CM | POA: Diagnosis not present

## 2024-02-17 DIAGNOSIS — I110 Hypertensive heart disease with heart failure: Secondary | ICD-10-CM | POA: Diagnosis not present

## 2024-02-20 DIAGNOSIS — I110 Hypertensive heart disease with heart failure: Secondary | ICD-10-CM | POA: Diagnosis not present

## 2024-02-20 DIAGNOSIS — E119 Type 2 diabetes mellitus without complications: Secondary | ICD-10-CM | POA: Diagnosis not present

## 2024-02-20 DIAGNOSIS — L97811 Non-pressure chronic ulcer of other part of right lower leg limited to breakdown of skin: Secondary | ICD-10-CM | POA: Diagnosis not present

## 2024-02-20 DIAGNOSIS — I480 Paroxysmal atrial fibrillation: Secondary | ICD-10-CM | POA: Diagnosis not present

## 2024-02-20 DIAGNOSIS — I872 Venous insufficiency (chronic) (peripheral): Secondary | ICD-10-CM | POA: Diagnosis not present

## 2024-02-23 DIAGNOSIS — I872 Venous insufficiency (chronic) (peripheral): Secondary | ICD-10-CM | POA: Diagnosis not present

## 2024-02-23 DIAGNOSIS — I480 Paroxysmal atrial fibrillation: Secondary | ICD-10-CM | POA: Diagnosis not present

## 2024-02-23 DIAGNOSIS — E119 Type 2 diabetes mellitus without complications: Secondary | ICD-10-CM | POA: Diagnosis not present

## 2024-02-23 DIAGNOSIS — I110 Hypertensive heart disease with heart failure: Secondary | ICD-10-CM | POA: Diagnosis not present

## 2024-02-23 DIAGNOSIS — L97811 Non-pressure chronic ulcer of other part of right lower leg limited to breakdown of skin: Secondary | ICD-10-CM | POA: Diagnosis not present

## 2024-02-26 DIAGNOSIS — I480 Paroxysmal atrial fibrillation: Secondary | ICD-10-CM | POA: Diagnosis not present

## 2024-02-26 DIAGNOSIS — I110 Hypertensive heart disease with heart failure: Secondary | ICD-10-CM | POA: Diagnosis not present

## 2024-02-26 DIAGNOSIS — L97811 Non-pressure chronic ulcer of other part of right lower leg limited to breakdown of skin: Secondary | ICD-10-CM | POA: Diagnosis not present

## 2024-02-26 DIAGNOSIS — I872 Venous insufficiency (chronic) (peripheral): Secondary | ICD-10-CM | POA: Diagnosis not present

## 2024-02-26 DIAGNOSIS — E119 Type 2 diabetes mellitus without complications: Secondary | ICD-10-CM | POA: Diagnosis not present

## 2024-02-27 ENCOUNTER — Ambulatory Visit (HOSPITAL_COMMUNITY)
Admission: RE | Admit: 2024-02-27 | Discharge: 2024-02-27 | Disposition: A | Discharge status: Home / Self Care | Source: Ambulatory Visit | Attending: Internal Medicine | Admitting: Internal Medicine

## 2024-02-27 ENCOUNTER — Inpatient Hospital Stay (HOSPITAL_COMMUNITY)
Admission: RE | Admit: 2024-02-27 | Discharge: 2024-02-27 | Disposition: A | Discharge status: Home / Self Care | Source: Ambulatory Visit | Attending: Internal Medicine | Admitting: Internal Medicine

## 2024-02-27 VITALS — BP 104/80 | HR 109 | Ht 63.0 in | Wt 261.4 lb

## 2024-02-27 DIAGNOSIS — I4891 Unspecified atrial fibrillation: Secondary | ICD-10-CM | POA: Diagnosis not present

## 2024-02-27 DIAGNOSIS — I4819 Other persistent atrial fibrillation: Secondary | ICD-10-CM

## 2024-02-27 DIAGNOSIS — D6869 Other thrombophilia: Secondary | ICD-10-CM | POA: Diagnosis not present

## 2024-02-27 MED ORDER — METOPROLOL SUCCINATE ER 50 MG PO TB24: 75.0000 mg | ORAL_TABLET | Freq: Every day | ORAL | Status: DC

## 2024-02-27 NOTE — Patient Instructions (Signed)
 Stop losartan   Increase metoprolol  to 75mg  once a day (1 and 1/2 tablets of your 50mg )

## 2024-02-27 NOTE — Progress Notes (Signed)
 Primary Care Physician: Karalee Oscar, PA Referring Physician: Same Primary cardiologist: Dr. Audery Blazing Primary EP: formerly Dr Belita Bowling Natasha Glass is a 83 y.o. 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 Sherene Dilling,  DM II, OSA with CPAP, arthritis, DVT, gerd, obesity, anemia, HTN, chronic LLE, that presented to PCP recently  with not feeling well  for a couple of weeks. She was found to be in afib with RVR. Her BB dose was increased and ARB stopped, she was started on xarelto  with a chadsvasc score of at least 5.  On initial visit, 6/25,  Ekg showed HR reasonably controlled at 103 bpm. BP soft at 100/58. She  has complaints of fatigue/lightheadedness. She has been able to lose 36 lbs by following a low carb diet over the last several months.Last echo in 2018 with normal EF.  F/u in Afib clinic, 12/16/23. She is currently in Afib. Seen by Cardiology on 12/08/23 and noted to be in Afib with RVR. Patient noted she was told by PCP the week prior she had elevated HR. No missed doses of Tikosyn  250 mcg BID or Eliquis  5 mg BID. She is seeing wound care for a sore on her right leg.   F/u in Afib clinic, 01/23/24. She is currently in Afib. She underwent DCCV on 12/19/23 and had 3 attempts at cardioversion. After last attempt at 360J she had a transient sinus rhythm with long pause/transient asystole. She received ~3-5 seconds of CPR and then was noted to be in atrial tachycardia. She went to the ED on 3/9 for worsening chest pain and SOB with multiple rib fractures likely secondary to CPR and was discharged on 3/17. She remains on Tikosyn  250 mcg BID and Toprol  12.5 mg at bedtime.   On follow up 02/27/24, she is currently in Afib. Seen by Dr. Arlester Ladd on 5/2 and Toprol  increased to 50 mg daily. Plan to attempt to continue rate control; AVJ ablation if fails medical therapy although this is less desirable. No missed doses of Eliquis  5 mg BID.   Today, she denies symptoms of  palpitations, chest pain, shortness of breath, orthopnea, PND, lower extremity edema, dizziness, presyncope, syncope, or neurologic sequela. The patient is tolerating medications without difficulties and is otherwise without complaint today.   Past Medical History:  Diagnosis Date   Anemia    Arthritis    CHF (congestive heart failure) (HCC)    DVT (deep venous thrombosis) (HCC) 01/2009   Dysrhythmia    GERD (gastroesophageal reflux disease)    Gout    History of blood transfusion    1990's   History of colonic polyps 09/2010   History of pneumonia    Hyperlipidemia    Hypertension    Lactose intolerance    Macular degeneration, dry    bilateral   Migraine    Obesity    Osteopenia    PAF (paroxysmal atrial fibrillation) (HCC)    Peripheral neuropathy    Pulmonary embolism (HCC) 01/2009   Sleep apnea    CPAP pressure 4.0-16.0   Thoracic ascending aortic aneurysm (HCC)    Type II diabetes mellitus (HCC)    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)    Urinary incontinence    Past Surgical History:  Procedure Laterality Date   ABDOMINAL EXPLORATION SURGERY  1960   ABDOMINAL HYSTERECTOMY  1986   CARDIAC CATHETERIZATION     CARDIOVERSION N/A 05/07/2018   Procedure: CARDIOVERSION;  Surgeon: Loyde Rule, MD;  Location: [facility];  Service: Cardiovascular;  Laterality: N/A;   CARDIOVERSION N/A 12/19/2023   Procedure: CARDIOVERSION;  Surgeon: Lenise Quince, MD;  Location: [facility];  Service: Cardiovascular;  Laterality: N/A;   COLONOSCOPY W/ BIOPSIES AND POLYPECTOMY  2005; 2011; 2012   COLONOSCOPY WITH PROPOFOL  N/A 09/10/2016   Procedure: COLONOSCOPY WITH PROPOFOL ;  Surgeon: Garrett Kallman, MD;  Location: [facility];  Service: Endoscopy;  Laterality: N/A;   JOINT REPLACEMENT     KNEE ARTHROSCOPY Left ~ 1999   SHOULDER OPEN ROTATOR CUFF REPAIR Left 2011   TEE WITHOUT CARDIOVERSION N/A 08/13/2022   Procedure: TRANSESOPHAGEAL ECHOCARDIOGRAM (TEE);   Surgeon: Jacqueline Matsu, MD;  Location: [facility] ENDOSCOPY;  Service: Cardiovascular;  Laterality: N/A;   TOTAL KNEE ARTHROPLASTY Bilateral 2005-2012   left-right    Current Outpatient Medications  Medication Sig Dispense Refill   acetaminophen  (TYLENOL ) 500 MG tablet Take 2 tablets (1,000 mg total) by mouth every 6 (six) hours as needed for mild pain (pain score 1-3), headache or fever. (Patient taking differently: Take 1,000 mg by mouth as needed for mild pain (pain score 1-3), headache or fever.) 30 tablet 0   albuterol  (VENTOLIN  HFA) 108 (90 Base) MCG/ACT inhaler Inhale 2 puffs into the lungs every 6 (six) hours as needed for wheezing or shortness of breath. 6.7 g 0   allopurinol  (ZYLOPRIM ) 300 MG tablet Take 0.5 tablets (150 mg total) by mouth every evening. 15 tablet 0   Cholecalciferol (VITAMIN D3) 25 MCG (1000 UT) CAPS Take 2,000 Units by mouth in the morning.     ciprofloxacin (CILOXAN) 0.3 % ophthalmic solution Place 1 drop into both eyes See admin instructions. First day- 1 drop 4 times daily x 2 days for eye injection     ELIQUIS  5 MG TABS tablet TAKE 1 TABLET BY MOUTH TWICE  DAILY 200 tablet 2   furosemide  (LASIX ) 40 MG tablet 1 tablet by mouth once daily, may take extra 1.5 tablets as needed for swelling (Patient taking differently: Take 60 mg by mouth daily. 1 tablet by mouth once daily, may take extra 1.5 tablets as needed for swelling) 90 tablet 3   HYDROcodone -acetaminophen  (NORCO/VICODIN) 5-325 MG tablet Take 1 tablet by mouth every 4 (four) hours as needed for moderate pain (pain score 4-6). (Patient taking differently: Take 1 tablet by mouth as needed for moderate pain (pain score 4-6).) 30 tablet 0   Hyprom-Naphaz-Polysorb-Zn Sulf (CLEAR EYES COMPLETE) SOLN Place 1 drop into both eyes as needed (itching).     lidocaine  (LIDODERM ) 5 % Place 1 patch onto the skin daily. Remove & Discard patch within 12 hours or as directed by MD (Patient taking differently: Place 1 patch onto the skin  as needed. Remove & Discard patch within 12 hours or as directed by MD)     losartan  (COZAAR ) 25 MG tablet Take 1 tablet (25 mg total) by mouth daily. 30 tablet 0   Magnesium  250 MG TABS Take 250 mg by mouth at bedtime.     metFORMIN  (GLUCOPHAGE ) 500 MG tablet Take 1 tablet (500 mg total) by mouth 2 (two) times daily with a meal. 60 tablet 0   metoprolol  succinate (TOPROL -XL) 50 MG 24 hr tablet Take 1 tablet (50 mg total) by mouth daily. 30 tablet 6   Multiple Vitamins-Minerals (PRESERVISION AREDS 2) CAPS Take 2 capsules by mouth daily with lunch.     potassium chloride  SA (KLOR-CON  M) 20 MEQ tablet Take 1 tablet (20 mEq total) by  mouth in the morning.     psyllium (METAMUCIL) 58.6 % packet Take 1 packet by mouth once a week. SHE ADDS TO HER OATMEAL WEEKLY     rosuvastatin  (CRESTOR ) 5 MG tablet Take 5 mg by mouth in the morning.     senna-docusate (SENOKOT-S) 8.6-50 MG tablet Take 1 tablet by mouth 2 (two) times daily. (Patient taking differently: Take 1 tablet by mouth as needed.)     vitamin B-12 (CYANOCOBALAMIN ) 500 MCG tablet Take 500 mcg by mouth in the morning.     No current facility-administered medications for this encounter.    Allergies  Allergen Reactions   Bactrim [Sulfamethoxazole-Trimethoprim]     Drop in BP   Morphine And Codeine Other (See Comments)    Unresponsive-Very bad reaction.  Can take hydrocodone .    Oysters [Shellfish Allergy] Shortness Of Breath, Itching, Swelling and Rash   Amlodipine Swelling and Other (See Comments)    Of legs   Percocet [Oxycodone-Acetaminophen ] Other (See Comments)    hallucinations   Tdap [Tetanus-Diphth-Acell Pertussis] Itching, Swelling and Rash   Tetanus Toxoids Itching, Swelling and Rash   Tramadol Swelling and Other (See Comments)    Of legs   Lactose Intolerance (Gi) Other (See Comments)   Other Other (See Comments)   Tegretol [Carbamazepine] Other (See Comments)    unknown   Tetanus-Diphtheria Toxoids Td Other (See Comments)    Topiramate Other (See Comments)   Diovan [Valsartan] Other (See Comments)    Cough   Diphtheria Toxoid Itching, Swelling and Rash   Macrodantin [Nitrofurantoin Macrocrystal] Other (See Comments)    unspecified   ROS- All systems are reviewed and negative except as per the HPI above  Physical Exam: Vitals:   02/27/24 1033  BP: 104/80  Pulse: (!) 109  Weight: 118.6 kg  Height: 5\' 3"  (1.6 m)     Wt Readings from Last 3 Encounters:  02/27/24 118.6 kg  02/13/24 119.7 kg  01/23/24 111.4 kg    Labs: Lab Results  Component Value Date   NA 139 01/23/2024   K 4.1 01/23/2024   CL 101 01/23/2024   CO2 26 01/23/2024   GLUCOSE 127 (H) 01/23/2024   BUN 27 (H) 01/23/2024   CREATININE 0.69 01/23/2024   CALCIUM  9.6 01/23/2024   MG 2.1 12/24/2023   Lab Results  Component Value Date   INR 3.6 (H) 05/23/2023   Lab Results  Component Value Date   CHOL  02/07/2009    136        ATP III CLASSIFICATION:  <200     mg/dL   Desirable  098-119  mg/dL   Borderline High  >=147    mg/dL   High          HDL 45 02/07/2009   LDLCALC  02/07/2009    74        Total Cholesterol/HDL:CHD Risk Coronary Heart Disease Risk Table                     Men   Women  1/2 Average Risk   3.4   3.3  Average Risk       5.0   4.4  2 X Average Risk   9.6   7.1  3 X Average Risk  23.4   11.0        Use the calculated Patient Ratio above and the CHD Risk Table to determine the patient's CHD Risk.        ATP III CLASSIFICATION (  LDL):  <100     mg/dL   Optimal  540-981  mg/dL   Near or Above                    Optimal  130-159  mg/dL   Borderline  191-478  mg/dL   High  >295     mg/dL   Very High   TRIG 84 62/13/0865   GEN- The patient is well appearing, alert and oriented x 3 today.   Neck - no JVD or carotid bruit noted Lungs- Crackles lower lobes bilaterally Heart- Irregular rate and rhythm, no murmurs, rubs or gallops, PMI not laterally displaced Extremities- no clubbing, cyanosis, or  edema Skin - no rash or ecchymosis noted   EKG-   Vent. rate 109 BPM PR interval * ms QRS duration 80 ms QT/QTcB 300/404 ms P-R-Natasha axes * 121 20 Atrial fibrillation with rapid ventricular response with premature ventricular or aberrantly conducted complexes Left posterior fascicular block Anteroseptal infarct (cited on or before 23-Jan-2024) Abnormal ECG When compared with ECG of 13-Feb-2024 09:55, Left posterior fascicular block is now Present ST no longer depressed in Inferior leads  Echo TEE 08/13/22  1. Left ventricular ejection fraction, by estimation, is 60 to 65%. The  left ventricle has normal function. The left ventricle has no regional  wall motion abnormalities.   2. Right ventricular systolic function is normal. The right ventricular  size is normal.   3. Left atrial size was moderately dilated. No left atrial/left atrial  appendage thrombus was detected. The LAA emptying velocity was 43 cm/s.   4. The mitral valve is normal in structure. Mild mitral valve  regurgitation. No evidence of mitral stenosis.   5. The aortic valve is tricuspid. Aortic valve regurgitation is mild.  Aortic valve sclerosis/calcification is present, without any evidence of  aortic stenosis.   6. There is mild (Grade II) layered plaque involving the ascending aorta.   7. The inferior vena cava is normal in size with greater than 50%  respiratory variability, suggesting right atrial pressure of 3 mmHg.   Conclusion(s)/Recommendation(s): Normal biventricular function without  evidence of hemodynamically significant valvular heart disease. That  patient converted spontaneously to NSR prior to attempt at cardioversion.    Assessment and Plan:  1. Persistent atrial fibrillation S/p unsuccessful DCCV on 12/19/23 complicated by asystole and several seconds of CPR. Discontinuation of Tikosyn  due to failure to maintain normal rhythm.  She is currently in Afib. Plan to continue rate control therapy  as best as we can to keep HR < 100. Increase Toprol  to 75 mg daily. Stop losartan  to help maintain BP. Will place cardiac monitor for 1 week to assess HR on increased dose of Toprol .   2. Secondary hypercoagulable state due to atrial fibrillation This patients CHA2DS2-VASc Score is 7.2 % stroke rate/year from a score of 5 Continue Eliquis  5 mg BID without interruption.    Follow up will be determined based on monitor results.    Minnie Amber, PA-C Afib Clinic [facility] [address] [place], [place] [address] [phone]

## 2024-03-01 DIAGNOSIS — I872 Venous insufficiency (chronic) (peripheral): Secondary | ICD-10-CM | POA: Diagnosis not present

## 2024-03-01 DIAGNOSIS — L97811 Non-pressure chronic ulcer of other part of right lower leg limited to breakdown of skin: Secondary | ICD-10-CM | POA: Diagnosis not present

## 2024-03-01 DIAGNOSIS — I480 Paroxysmal atrial fibrillation: Secondary | ICD-10-CM | POA: Diagnosis not present

## 2024-03-01 DIAGNOSIS — I110 Hypertensive heart disease with heart failure: Secondary | ICD-10-CM | POA: Diagnosis not present

## 2024-03-01 DIAGNOSIS — E119 Type 2 diabetes mellitus without complications: Secondary | ICD-10-CM | POA: Diagnosis not present

## 2024-03-04 ENCOUNTER — Ambulatory Visit (HOSPITAL_COMMUNITY): Payer: Medicare Other | Admitting: Internal Medicine

## 2024-03-04 DIAGNOSIS — I872 Venous insufficiency (chronic) (peripheral): Secondary | ICD-10-CM | POA: Diagnosis not present

## 2024-03-04 DIAGNOSIS — I480 Paroxysmal atrial fibrillation: Secondary | ICD-10-CM | POA: Diagnosis not present

## 2024-03-04 DIAGNOSIS — I110 Hypertensive heart disease with heart failure: Secondary | ICD-10-CM | POA: Diagnosis not present

## 2024-03-04 DIAGNOSIS — E119 Type 2 diabetes mellitus without complications: Secondary | ICD-10-CM | POA: Diagnosis not present

## 2024-03-04 DIAGNOSIS — L97811 Non-pressure chronic ulcer of other part of right lower leg limited to breakdown of skin: Secondary | ICD-10-CM | POA: Diagnosis not present

## 2024-03-10 DIAGNOSIS — I872 Venous insufficiency (chronic) (peripheral): Secondary | ICD-10-CM | POA: Diagnosis not present

## 2024-03-10 DIAGNOSIS — E119 Type 2 diabetes mellitus without complications: Secondary | ICD-10-CM | POA: Diagnosis not present

## 2024-03-10 DIAGNOSIS — I110 Hypertensive heart disease with heart failure: Secondary | ICD-10-CM | POA: Diagnosis not present

## 2024-03-10 DIAGNOSIS — G4733 Obstructive sleep apnea (adult) (pediatric): Secondary | ICD-10-CM | POA: Diagnosis not present

## 2024-03-10 DIAGNOSIS — R269 Unspecified abnormalities of gait and mobility: Secondary | ICD-10-CM | POA: Diagnosis not present

## 2024-03-10 DIAGNOSIS — I480 Paroxysmal atrial fibrillation: Secondary | ICD-10-CM | POA: Diagnosis not present

## 2024-03-10 DIAGNOSIS — I4819 Other persistent atrial fibrillation: Secondary | ICD-10-CM | POA: Diagnosis not present

## 2024-03-10 DIAGNOSIS — L97811 Non-pressure chronic ulcer of other part of right lower leg limited to breakdown of skin: Secondary | ICD-10-CM | POA: Diagnosis not present

## 2024-03-12 ENCOUNTER — Encounter (INDEPENDENT_AMBULATORY_CARE_PROVIDER_SITE_OTHER): Admitting: Ophthalmology

## 2024-03-12 DIAGNOSIS — H35033 Hypertensive retinopathy, bilateral: Secondary | ICD-10-CM | POA: Diagnosis not present

## 2024-03-12 DIAGNOSIS — H353112 Nonexudative age-related macular degeneration, right eye, intermediate dry stage: Secondary | ICD-10-CM | POA: Diagnosis not present

## 2024-03-12 DIAGNOSIS — H35372 Puckering of macula, left eye: Secondary | ICD-10-CM | POA: Diagnosis not present

## 2024-03-12 DIAGNOSIS — H43813 Vitreous degeneration, bilateral: Secondary | ICD-10-CM | POA: Diagnosis not present

## 2024-03-12 DIAGNOSIS — I10 Essential (primary) hypertension: Secondary | ICD-10-CM

## 2024-03-12 DIAGNOSIS — H353221 Exudative age-related macular degeneration, left eye, with active choroidal neovascularization: Secondary | ICD-10-CM | POA: Diagnosis not present

## 2024-03-17 ENCOUNTER — Ambulatory Visit: Admitting: Podiatry

## 2024-03-17 ENCOUNTER — Encounter: Payer: Self-pay | Admitting: Podiatry

## 2024-03-17 DIAGNOSIS — M79675 Pain in left toe(s): Secondary | ICD-10-CM | POA: Diagnosis not present

## 2024-03-17 DIAGNOSIS — M79674 Pain in right toe(s): Secondary | ICD-10-CM | POA: Diagnosis not present

## 2024-03-17 DIAGNOSIS — E1142 Type 2 diabetes mellitus with diabetic polyneuropathy: Secondary | ICD-10-CM

## 2024-03-17 DIAGNOSIS — B351 Tinea unguium: Secondary | ICD-10-CM | POA: Diagnosis not present

## 2024-03-17 NOTE — Progress Notes (Signed)
 This patient returns to my office for at risk foot care.  This patient requires this care by a professional since this patient will be at risk due to having type 2 diabetes.  This patient is unable to cut nails herself since the patient cannot reach her nails.These nails are painful walking and wearing shoes.  This patient presents for at risk foot care today.  General Appearance  Alert, conversant and in no acute stress.  Vascular  Dorsalis pedis and posterior tibial  pulses are  weakly palpable  due to swelling.bilaterally.  Capillary return is within normal limits  bilaterally. Temperature is within normal limits  bilaterally.  Neurologic  Senn-Weinstein monofilament wire test diminished  bilaterally. Muscle power within normal limits bilaterally.  Nails Thick disfigured discolored nails with subungual debris  from hallux to fifth toes bilaterally. No evidence of bacterial infection or drainage bilaterally.  Orthopedic  No limitations of motion  feet .  No crepitus or effusions noted.  No bony pathology or digital deformities noted. HAV  B/L.  Skin  normotropic skin with no porokeratosis noted bilaterally.  No signs of infections or ulcers noted.     Onychomycosis  Pain in right toes  Pain in left toes  Consent was obtained for treatment procedures.   Mechanical debridement of nails 1-5  bilaterally performed with a nail nipper.  Filed with dremel without incident. Patient qualifies for diabetic shoes due to DPN and HAV  B/L.   Return office visit    4  months                  Told patient to return for periodic foot care and evaluation due to potential at risk complications.   Ruffin Cotton DPM

## 2024-03-24 ENCOUNTER — Ambulatory Visit (HOSPITAL_COMMUNITY): Payer: Self-pay | Admitting: Internal Medicine

## 2024-03-25 DIAGNOSIS — I10 Essential (primary) hypertension: Secondary | ICD-10-CM | POA: Diagnosis not present

## 2024-03-25 DIAGNOSIS — I4891 Unspecified atrial fibrillation: Secondary | ICD-10-CM | POA: Diagnosis not present

## 2024-03-25 DIAGNOSIS — G4733 Obstructive sleep apnea (adult) (pediatric): Secondary | ICD-10-CM | POA: Diagnosis not present

## 2024-03-25 DIAGNOSIS — Z9989 Dependence on other enabling machines and devices: Secondary | ICD-10-CM | POA: Diagnosis not present

## 2024-03-25 DIAGNOSIS — I5032 Chronic diastolic (congestive) heart failure: Secondary | ICD-10-CM | POA: Diagnosis not present

## 2024-03-26 DIAGNOSIS — G4733 Obstructive sleep apnea (adult) (pediatric): Secondary | ICD-10-CM | POA: Diagnosis not present

## 2024-03-29 ENCOUNTER — Telehealth (HOSPITAL_COMMUNITY): Payer: Self-pay | Admitting: *Deleted

## 2024-03-29 MED ORDER — METOPROLOL SUCCINATE ER 100 MG PO TB24: 100.0000 mg | ORAL_TABLET | Freq: Every day | ORAL | 3 refills | Status: DC

## 2024-03-29 NOTE — Telephone Encounter (Signed)
 Pt called with updated B/P 140-146/75-84 HR in 70's on 75 mg metoprolol  discussed with Caesar Caster P.A. and plan is to increase metoprolol  to 100 mg daily. Pt will call with another update in week.

## 2024-03-30 DIAGNOSIS — N390 Urinary tract infection, site not specified: Secondary | ICD-10-CM | POA: Diagnosis not present

## 2024-03-30 DIAGNOSIS — M109 Gout, unspecified: Secondary | ICD-10-CM | POA: Diagnosis not present

## 2024-04-01 ENCOUNTER — Other Ambulatory Visit (HOSPITAL_COMMUNITY): Payer: Self-pay | Admitting: Internal Medicine

## 2024-04-08 ENCOUNTER — Telehealth: Payer: Self-pay | Admitting: Cardiology

## 2024-04-08 NOTE — Telephone Encounter (Signed)
 Patient would like to switch from Dr. Pietro to Dr. Lavona in the [place].  That office is closer for her.  Are you both in agreement?

## 2024-04-12 DIAGNOSIS — I10 Essential (primary) hypertension: Secondary | ICD-10-CM | POA: Diagnosis not present

## 2024-04-12 DIAGNOSIS — I4891 Unspecified atrial fibrillation: Secondary | ICD-10-CM | POA: Diagnosis not present

## 2024-04-12 DIAGNOSIS — E1165 Type 2 diabetes mellitus with hyperglycemia: Secondary | ICD-10-CM | POA: Diagnosis not present

## 2024-04-12 DIAGNOSIS — I5032 Chronic diastolic (congestive) heart failure: Secondary | ICD-10-CM | POA: Diagnosis not present

## 2024-04-14 ENCOUNTER — Encounter (INDEPENDENT_AMBULATORY_CARE_PROVIDER_SITE_OTHER): Admitting: Ophthalmology

## 2024-04-22 ENCOUNTER — Encounter (INDEPENDENT_AMBULATORY_CARE_PROVIDER_SITE_OTHER): Admitting: Ophthalmology

## 2024-04-22 DIAGNOSIS — H353231 Exudative age-related macular degeneration, bilateral, with active choroidal neovascularization: Secondary | ICD-10-CM

## 2024-04-22 DIAGNOSIS — H43813 Vitreous degeneration, bilateral: Secondary | ICD-10-CM

## 2024-04-22 DIAGNOSIS — I10 Essential (primary) hypertension: Secondary | ICD-10-CM | POA: Diagnosis not present

## 2024-04-22 DIAGNOSIS — H35033 Hypertensive retinopathy, bilateral: Secondary | ICD-10-CM

## 2024-04-26 DIAGNOSIS — I10 Essential (primary) hypertension: Secondary | ICD-10-CM | POA: Diagnosis not present

## 2024-04-26 DIAGNOSIS — I5032 Chronic diastolic (congestive) heart failure: Secondary | ICD-10-CM | POA: Diagnosis not present

## 2024-04-26 DIAGNOSIS — G4733 Obstructive sleep apnea (adult) (pediatric): Secondary | ICD-10-CM | POA: Diagnosis not present

## 2024-04-26 DIAGNOSIS — G4734 Idiopathic sleep related nonobstructive alveolar hypoventilation: Secondary | ICD-10-CM | POA: Diagnosis not present

## 2024-04-26 DIAGNOSIS — I4891 Unspecified atrial fibrillation: Secondary | ICD-10-CM | POA: Diagnosis not present

## 2024-05-13 DIAGNOSIS — I5032 Chronic diastolic (congestive) heart failure: Secondary | ICD-10-CM | POA: Diagnosis not present

## 2024-05-13 DIAGNOSIS — E1165 Type 2 diabetes mellitus with hyperglycemia: Secondary | ICD-10-CM | POA: Diagnosis not present

## 2024-05-13 DIAGNOSIS — I10 Essential (primary) hypertension: Secondary | ICD-10-CM | POA: Diagnosis not present

## 2024-05-13 DIAGNOSIS — I4891 Unspecified atrial fibrillation: Secondary | ICD-10-CM | POA: Diagnosis not present

## 2024-05-27 ENCOUNTER — Encounter (INDEPENDENT_AMBULATORY_CARE_PROVIDER_SITE_OTHER): Admitting: Ophthalmology

## 2024-05-27 DIAGNOSIS — H43813 Vitreous degeneration, bilateral: Secondary | ICD-10-CM | POA: Diagnosis not present

## 2024-05-27 DIAGNOSIS — H353112 Nonexudative age-related macular degeneration, right eye, intermediate dry stage: Secondary | ICD-10-CM | POA: Diagnosis not present

## 2024-05-27 DIAGNOSIS — H353221 Exudative age-related macular degeneration, left eye, with active choroidal neovascularization: Secondary | ICD-10-CM | POA: Diagnosis not present

## 2024-05-27 DIAGNOSIS — H35033 Hypertensive retinopathy, bilateral: Secondary | ICD-10-CM

## 2024-05-27 DIAGNOSIS — I10 Essential (primary) hypertension: Secondary | ICD-10-CM | POA: Diagnosis not present

## 2024-06-13 DIAGNOSIS — I5032 Chronic diastolic (congestive) heart failure: Secondary | ICD-10-CM | POA: Diagnosis not present

## 2024-06-13 DIAGNOSIS — E1165 Type 2 diabetes mellitus with hyperglycemia: Secondary | ICD-10-CM | POA: Diagnosis not present

## 2024-06-13 DIAGNOSIS — I10 Essential (primary) hypertension: Secondary | ICD-10-CM | POA: Diagnosis not present

## 2024-06-13 DIAGNOSIS — I4891 Unspecified atrial fibrillation: Secondary | ICD-10-CM | POA: Diagnosis not present

## 2024-06-20 NOTE — Progress Notes (Unsigned)
  Cardiology Office Note:   Date:  06/20/2024  ID:  Natasha Glass, DOB [birth year], MRN [id number] PCP: Natasha Schuyler HERO, PA  [facility] Providers Cardiologist:  Redell Shallow, MD Electrophysiologist:  Lynwood Rakers, MD (Inactive) {  History of Present Illness:   Natasha Glass is a 83 y.o. female ***  She did not have good rate control on a monitor in June.  ***   Failed Tikosyn  She does not want to undergo another cardioversion; I think attempts at rhythm control would be futile She is functional in atrial fibrillation though rates are currently poorly controlled There is room to increase metoprolol  I think there is a low chance for spontaneous conversion to sinus rhythm --particularly, given multiple failed cardioversions Will increase metoprolol  XL to 50 mg daily Follow-up in A-fib clinic in 2 weeks for further titration of rate control; brief monitor may be placed at that time to assess rates at home Failing medical rate control, she may be a candidate for pacemaker and AVJ ablation though I would like to avoid this given her age and frailty   ROS: ***  Studies Reviewed:    EKG:       ***  Risk Assessment/Calculations:   {Does this patient have ATRIAL FIBRILLATION?:[phone]} No BP recorded.  {Refresh Note OR Click here to enter BP  :1}***        Physical Exam:   VS:  There were no vitals taken for this visit.   Wt Readings from Last 3 Encounters:  02/27/24 261 lb 6.4 oz (118.6 kg)  02/13/24 264 lb (119.7 kg)  01/23/24 245 lb 9.6 oz (111.4 kg)     GEN: Well nourished, well developed in no acute distress NECK: No JVD; No carotid bruits CARDIAC: ***RR, *** murmurs, rubs, gallops RESPIRATORY:  Clear to auscultation without rales, wheezing or rhonchi  ABDOMEN: Soft, non-tender, non-distended EXTREMITIES:  No edema; No deformity   ASSESSMENT AND PLAN:   ***    Atrial fib:  ***  Tachybrady syndrome:  ***     Follow up ***  Signed, Lynwood Schilling, MD

## 2024-06-22 DIAGNOSIS — N390 Urinary tract infection, site not specified: Secondary | ICD-10-CM | POA: Diagnosis not present

## 2024-06-23 ENCOUNTER — Ambulatory Visit: Admitting: Cardiology

## 2024-06-23 ENCOUNTER — Encounter: Payer: Self-pay | Admitting: Cardiology

## 2024-06-23 VITALS — BP 120/80 | HR 116 | Ht 63.0 in | Wt 259.0 lb

## 2024-06-23 DIAGNOSIS — D6869 Other thrombophilia: Secondary | ICD-10-CM | POA: Diagnosis not present

## 2024-06-23 DIAGNOSIS — I4819 Other persistent atrial fibrillation: Secondary | ICD-10-CM

## 2024-06-23 MED ORDER — METOPROLOL TARTRATE 25 MG PO TABS: 12.5000 mg | ORAL_TABLET | Freq: Three times a day (TID) | ORAL | 3 refills | Status: DC | PRN

## 2024-06-23 NOTE — Patient Instructions (Addendum)
 Medication Instructions:  Your physician has recommended you make the following change in your medication:  Start metoprolol  tartrate taking 12.5 mg - 25 mg every 8 hours as needed for heart rate 120 or greater. Continue all other medications as prescribed.  Labwork: none  Testing/Procedures: none  Follow-Up: Your physician recommends that you schedule a follow-up appointment in: 3 months  Any Other Special Instructions Will Be Listed Below (If Applicable).  If you need a refill on your cardiac medications before your next appointment, please call your pharmacy.

## 2024-06-25 ENCOUNTER — Other Ambulatory Visit (HOSPITAL_COMMUNITY): Payer: Self-pay | Admitting: Internal Medicine

## 2024-07-01 ENCOUNTER — Encounter (INDEPENDENT_AMBULATORY_CARE_PROVIDER_SITE_OTHER): Admitting: Ophthalmology

## 2024-07-01 DIAGNOSIS — H35033 Hypertensive retinopathy, bilateral: Secondary | ICD-10-CM

## 2024-07-01 DIAGNOSIS — I10 Essential (primary) hypertension: Secondary | ICD-10-CM

## 2024-07-01 DIAGNOSIS — H43813 Vitreous degeneration, bilateral: Secondary | ICD-10-CM

## 2024-07-01 DIAGNOSIS — H353112 Nonexudative age-related macular degeneration, right eye, intermediate dry stage: Secondary | ICD-10-CM | POA: Diagnosis not present

## 2024-07-01 DIAGNOSIS — H35372 Puckering of macula, left eye: Secondary | ICD-10-CM | POA: Diagnosis not present

## 2024-07-01 DIAGNOSIS — H353221 Exudative age-related macular degeneration, left eye, with active choroidal neovascularization: Secondary | ICD-10-CM

## 2024-07-12 ENCOUNTER — Encounter: Payer: Self-pay | Admitting: Podiatry

## 2024-07-12 ENCOUNTER — Ambulatory Visit: Admitting: Podiatry

## 2024-07-12 DIAGNOSIS — M2041 Other hammer toe(s) (acquired), right foot: Secondary | ICD-10-CM

## 2024-07-12 DIAGNOSIS — B351 Tinea unguium: Secondary | ICD-10-CM

## 2024-07-12 DIAGNOSIS — E1142 Type 2 diabetes mellitus with diabetic polyneuropathy: Secondary | ICD-10-CM

## 2024-07-12 DIAGNOSIS — M79674 Pain in right toe(s): Secondary | ICD-10-CM

## 2024-07-12 DIAGNOSIS — M2042 Other hammer toe(s) (acquired), left foot: Secondary | ICD-10-CM

## 2024-07-12 DIAGNOSIS — M79675 Pain in left toe(s): Secondary | ICD-10-CM | POA: Diagnosis not present

## 2024-07-12 NOTE — Progress Notes (Signed)
 This patient returns to my office for at risk foot care.  This patient requires this care by a professional since this patient will be at risk due to having type 2 diabetes.  This patient is unable to cut nails herself since the patient cannot reach her nails.These nails are painful walking and wearing shoes.  This patient presents for at risk foot care today.  General Appearance  Alert, conversant and in no acute stress.  Vascular  Dorsalis pedis and posterior tibial  pulses are  weakly palpable  due to swelling.bilaterally.  Capillary return is within normal limits  bilaterally. Temperature is within normal limits  bilaterally.  Neurologic  Senn-Weinstein monofilament wire test diminished  bilaterally. Muscle power within normal limits bilaterally.  Nails Thick disfigured discolored nails with subungual debris  from hallux to fifth toes bilaterally. No evidence of bacterial infection or drainage bilaterally.  Orthopedic  No limitations of motion  feet .  No crepitus or effusions noted.  No bony pathology or digital deformities noted. HAV  B/L.  Skin  normotropic skin with no porokeratosis noted bilaterally.  No signs of infections or ulcers noted.     Onychomycosis  Pain in right toes  Pain in left toes  Consent was obtained for treatment procedures.   Mechanical debridement of nails 1-5  bilaterally performed with a nail nipper.  Filed with dremel without incident.    Return office visit    3  months                  Told patient to return for periodic foot care and evaluation due to potential at risk complications.   Cordella Bold DPM

## 2024-07-13 DIAGNOSIS — E1165 Type 2 diabetes mellitus with hyperglycemia: Secondary | ICD-10-CM | POA: Diagnosis not present

## 2024-07-13 DIAGNOSIS — I5032 Chronic diastolic (congestive) heart failure: Secondary | ICD-10-CM | POA: Diagnosis not present

## 2024-07-13 DIAGNOSIS — I10 Essential (primary) hypertension: Secondary | ICD-10-CM | POA: Diagnosis not present

## 2024-07-13 DIAGNOSIS — I4891 Unspecified atrial fibrillation: Secondary | ICD-10-CM | POA: Diagnosis not present

## 2024-07-14 ENCOUNTER — Ambulatory Visit: Admitting: Podiatry

## 2024-07-21 ENCOUNTER — Other Ambulatory Visit: Payer: Self-pay | Admitting: Cardiology

## 2024-07-27 DIAGNOSIS — E1165 Type 2 diabetes mellitus with hyperglycemia: Secondary | ICD-10-CM | POA: Diagnosis not present

## 2024-07-27 DIAGNOSIS — I10 Essential (primary) hypertension: Secondary | ICD-10-CM | POA: Diagnosis not present

## 2024-07-27 DIAGNOSIS — E11319 Type 2 diabetes mellitus with unspecified diabetic retinopathy without macular edema: Secondary | ICD-10-CM | POA: Diagnosis not present

## 2024-07-27 DIAGNOSIS — G4733 Obstructive sleep apnea (adult) (pediatric): Secondary | ICD-10-CM | POA: Diagnosis not present

## 2024-07-27 DIAGNOSIS — E559 Vitamin D deficiency, unspecified: Secondary | ICD-10-CM | POA: Diagnosis not present

## 2024-07-27 DIAGNOSIS — M810 Age-related osteoporosis without current pathological fracture: Secondary | ICD-10-CM | POA: Diagnosis not present

## 2024-07-27 DIAGNOSIS — E782 Mixed hyperlipidemia: Secondary | ICD-10-CM | POA: Diagnosis not present

## 2024-07-27 DIAGNOSIS — Z23 Encounter for immunization: Secondary | ICD-10-CM | POA: Diagnosis not present

## 2024-07-27 DIAGNOSIS — Z0000 Encounter for general adult medical examination without abnormal findings: Secondary | ICD-10-CM | POA: Diagnosis not present

## 2024-07-27 DIAGNOSIS — I4891 Unspecified atrial fibrillation: Secondary | ICD-10-CM | POA: Diagnosis not present

## 2024-08-05 ENCOUNTER — Encounter (INDEPENDENT_AMBULATORY_CARE_PROVIDER_SITE_OTHER): Admitting: Ophthalmology

## 2024-08-05 DIAGNOSIS — H43813 Vitreous degeneration, bilateral: Secondary | ICD-10-CM

## 2024-08-05 DIAGNOSIS — H353112 Nonexudative age-related macular degeneration, right eye, intermediate dry stage: Secondary | ICD-10-CM | POA: Diagnosis not present

## 2024-08-05 DIAGNOSIS — H353221 Exudative age-related macular degeneration, left eye, with active choroidal neovascularization: Secondary | ICD-10-CM

## 2024-08-05 DIAGNOSIS — H35033 Hypertensive retinopathy, bilateral: Secondary | ICD-10-CM | POA: Diagnosis not present

## 2024-08-05 DIAGNOSIS — I10 Essential (primary) hypertension: Secondary | ICD-10-CM | POA: Diagnosis not present

## 2024-08-05 DIAGNOSIS — H35372 Puckering of macula, left eye: Secondary | ICD-10-CM | POA: Diagnosis not present

## 2024-09-12 DIAGNOSIS — I495 Sick sinus syndrome: Secondary | ICD-10-CM | POA: Insufficient documentation

## 2024-09-12 NOTE — Progress Notes (Unsigned)
 Cardiology Office Note:   Date:  09/15/2024  ID:  Natasha Glass, DOB 1941-01-11, MRN [id number] PCP: Lennice Mliss NOVAK, PA  [facility] Providers Cardiologist:  Lynwood Schilling, MD Electrophysiologist:  Eulas FORBES Furbish, MD {  History of Present Illness:   Natasha Glass is a 83 y.o. female she is being managed for fibrillation/flutter.  She previously did not have good rate control on her monitor in May of this year.  She has failed Tikosyn .  She has had her beta-blocker dose increased.  She presents for follow-up.  She has had her beta-blocker dose titrated up and says that at rest her heart rate is 70-80 frequently.  Once in a while it is in the 50s.     Of note I did look at her past history and she was in the hospital with some fractured ribs.  This happened when she received brief CPR during the December 19, 2023 cardioversion.  She has had a lot of chest pain related to that though she has slowly recovered.  She gets around with a walker.  Home health is coming to wrap her legs because she has some chronic lower extremity edema and venous stasis ulcers.  She keeps her legs elevated.  She thinks her swelling is baseline.  She denies any chest pressure, neck or arm discomfort.  She does not really feel palpitations although not long ago she woke with some pain behind her legs and was startled and had a heart rate in the 160s.  She took a as needed 25 mg metoprolol  tartrate and things settle down.  She has rarely had to do that.    ROS: As stated in the HPI and negative for all other systems.  Studies Reviewed:    EKG:     NA  Risk Assessment/Calculations:    CHA2DS2-VASc Score = 3   This indicates a 3.2% annual risk of stroke. The patient's score is based upon: CHF History: 0 HTN History: 0 Diabetes History: 0 Stroke History: 0 Vascular Disease History: 0 Age Score: 2 Gender Score: 1   Physical Exam:   VS:  BP 120/76   Pulse 86   Ht 5' 3 (1.6 m)   Wt 263 lb (119.3 kg)    BMI 46.59 kg/m    Wt Readings from Last 3 Encounters:  09/15/24 263 lb (119.3 kg)  06/23/24 259 lb (117.5 kg)  02/27/24 261 lb 6.4 oz (118.6 kg)     GEN: Well nourished, well developed in no acute distress NECK: No JVD; No carotid bruits CARDIAC: Irregular RR, no murmurs, rubs, gallops RESPIRATORY:  Clear to auscultation without rales, wheezing or rhonchi  ABDOMEN: Soft, non-tender, non-distended EXTREMITIES: Moderate bilateral lower extremity edema; No deformity   ASSESSMENT AND PLAN:   Atrial fib: Overall her rate seems to be well-controlled.  We are pursuing more conservative management at her request which is medications.  She tolerates anticoagulation.  No change in therapy.  Tachybrady syndrome:   As above I do not think she be a good candidate for ablation.  She really would not want to consider AV node ablation and pacemaker.  Therefore, we are pursuing rate control which I think is reasonable and anticoagulation.  She has had some low heart rates but no symptoms related to this.  CPAP: She has follow-up with her sleep apnea doctors in the next month.      Follow up with me in 1 year  Signed, Lynwood Schilling, MD

## 2024-09-14 ENCOUNTER — Encounter (INDEPENDENT_AMBULATORY_CARE_PROVIDER_SITE_OTHER): Admitting: Ophthalmology

## 2024-09-14 DIAGNOSIS — H353112 Nonexudative age-related macular degeneration, right eye, intermediate dry stage: Secondary | ICD-10-CM

## 2024-09-14 DIAGNOSIS — H353221 Exudative age-related macular degeneration, left eye, with active choroidal neovascularization: Secondary | ICD-10-CM | POA: Diagnosis not present

## 2024-09-14 DIAGNOSIS — H35033 Hypertensive retinopathy, bilateral: Secondary | ICD-10-CM

## 2024-09-14 DIAGNOSIS — I10 Essential (primary) hypertension: Secondary | ICD-10-CM | POA: Diagnosis not present

## 2024-09-14 DIAGNOSIS — H43813 Vitreous degeneration, bilateral: Secondary | ICD-10-CM

## 2024-09-15 ENCOUNTER — Ambulatory Visit: Admitting: Cardiology

## 2024-09-15 ENCOUNTER — Other Ambulatory Visit (HOSPITAL_COMMUNITY): Payer: Self-pay

## 2024-09-15 ENCOUNTER — Telehealth: Payer: Self-pay | Admitting: Pharmacy Technician

## 2024-09-15 ENCOUNTER — Encounter: Payer: Self-pay | Admitting: Cardiology

## 2024-09-15 VITALS — BP 120/76 | HR 86 | Ht 63.0 in | Wt 263.0 lb

## 2024-09-15 DIAGNOSIS — G4733 Obstructive sleep apnea (adult) (pediatric): Secondary | ICD-10-CM | POA: Diagnosis not present

## 2024-09-15 DIAGNOSIS — I495 Sick sinus syndrome: Secondary | ICD-10-CM | POA: Diagnosis not present

## 2024-09-15 DIAGNOSIS — I4819 Other persistent atrial fibrillation: Secondary | ICD-10-CM | POA: Diagnosis not present

## 2024-09-15 DIAGNOSIS — I480 Paroxysmal atrial fibrillation: Secondary | ICD-10-CM

## 2024-09-15 NOTE — Patient Instructions (Addendum)

## 2024-09-15 NOTE — Telephone Encounter (Signed)
   Submitted [employer] grant waiting on approval.

## 2024-09-16 MED ORDER — ELIQUIS 5 MG PO TABS: 5.0000 mg | ORAL_TABLET | Freq: Two times a day (BID) | ORAL | 5 refills | Status: DC

## 2024-09-16 NOTE — Addendum Note (Signed)
 Addended by: JOHNNYE LITTIE HERO on: 09/16/2024 02:28 PM   Modules accepted: Orders

## 2024-09-16 NOTE — Telephone Encounter (Signed)
 Advised patient of approval and sent in to [facility] as requested.

## 2024-09-16 NOTE — Telephone Encounter (Signed)
 Hi, can the eliquis  pres[facility]? She was approved for eliquis      Patient Advocate Encounter   The patient was approved for a [employer] grant that will help cover the cost of eliquis  Total amount awarded, [id number].  Effective: 08/16/24 - 08/15/25   APW:[id number] ERW:[id number] Group:[id number] PI:[id number] [employer] ID: [id number]   Pharmacy provided with approval and processing information. Patient informed via mychart/telephone

## 2024-09-24 ENCOUNTER — Other Ambulatory Visit (HOSPITAL_COMMUNITY): Payer: Self-pay

## 2024-10-03 ENCOUNTER — Other Ambulatory Visit: Payer: Self-pay | Admitting: Cardiology

## 2024-10-03 DIAGNOSIS — I480 Paroxysmal atrial fibrillation: Secondary | ICD-10-CM

## 2024-10-04 NOTE — Telephone Encounter (Signed)
 Eliquis  5mg  refill request received. Patient is 83 years old, weight-119.3kg, Crea-0.62 on 07/29/24 via [facility] from [facility], and last seen by Dr. Lavona on 09/15/24 . Dose is appropriate based on dosing criteria. Will send in refill to requested pharmacy.

## 2024-10-12 ENCOUNTER — Encounter: Payer: Self-pay | Admitting: Podiatry

## 2024-10-12 ENCOUNTER — Ambulatory Visit (INDEPENDENT_AMBULATORY_CARE_PROVIDER_SITE_OTHER): Admitting: Podiatry

## 2024-10-12 DIAGNOSIS — M79675 Pain in left toe(s): Secondary | ICD-10-CM

## 2024-10-12 DIAGNOSIS — E1142 Type 2 diabetes mellitus with diabetic polyneuropathy: Secondary | ICD-10-CM

## 2024-10-12 DIAGNOSIS — B351 Tinea unguium: Secondary | ICD-10-CM | POA: Diagnosis not present

## 2024-10-12 DIAGNOSIS — M79674 Pain in right toe(s): Secondary | ICD-10-CM | POA: Diagnosis not present

## 2024-10-12 NOTE — Progress Notes (Signed)
 This patient returns to my office for at risk foot care.  This patient requires this care by a professional since this patient will be at risk due to having type 2 diabetes.  This patient is unable to cut nails herself since the patient cannot reach her nails.These nails are painful walking and wearing shoes.  She is wearing a bandage on her right lower leg. This patient presents for at risk foot care today.  General Appearance  Alert, conversant and in no acute stress.  Vascular  Dorsalis pedis and posterior tibial  pulses are  weakly palpable  due to swelling.bilaterally.  Capillary return is within normal limits  bilaterally. Temperature is within normal limits  bilaterally.  Neurologic  Senn-Weinstein monofilament wire test diminished  bilaterally. Muscle power within normal limits bilaterally.  Nails Thick disfigured discolored nails with subungual debris  from hallux to fifth toes bilaterally. No evidence of bacterial infection or drainage bilaterally.  Orthopedic  No limitations of motion  feet .  No crepitus or effusions noted.  No bony pathology or digital deformities noted. HAV  B/L.  Skin  normotropic skin with no porokeratosis noted bilaterally.  No signs of infections or ulcers noted.   Ecchymosis medial border right hallux at toenail level.  No fluctuance noted.  Onychomycosis  Pain in right toes  Pain in left toes  Consent was obtained for treatment procedures.   Mechanical debridement of nails 1-5  bilaterally performed with a nail nipper.  Filed with dremel without incident.    Return office visit    3  months                  Told patient to return for periodic foot care and evaluation due to potential at risk complications.   Cordella Bold DPM

## 2024-10-22 ENCOUNTER — Telehealth: Payer: Self-pay | Admitting: Pharmacy Technician

## 2024-10-22 NOTE — Telephone Encounter (Signed)
 Patient called stating eliquis  is 400. I called [facility] and reminded them to run the eliquis  on [address]  Now it is free  Patient is aware

## 2024-10-27 ENCOUNTER — Encounter (INDEPENDENT_AMBULATORY_CARE_PROVIDER_SITE_OTHER): Admitting: Ophthalmology

## 2024-10-29 ENCOUNTER — Encounter (INDEPENDENT_AMBULATORY_CARE_PROVIDER_SITE_OTHER): Admitting: Ophthalmology

## 2024-10-29 DIAGNOSIS — H43813 Vitreous degeneration, bilateral: Secondary | ICD-10-CM | POA: Diagnosis not present

## 2024-10-29 DIAGNOSIS — H353112 Nonexudative age-related macular degeneration, right eye, intermediate dry stage: Secondary | ICD-10-CM

## 2024-10-29 DIAGNOSIS — H35033 Hypertensive retinopathy, bilateral: Secondary | ICD-10-CM

## 2024-10-29 DIAGNOSIS — I10 Essential (primary) hypertension: Secondary | ICD-10-CM

## 2024-10-29 DIAGNOSIS — H353221 Exudative age-related macular degeneration, left eye, with active choroidal neovascularization: Secondary | ICD-10-CM

## 2024-11-01 ENCOUNTER — Ambulatory Visit (HOSPITAL_COMMUNITY)
Admission: RE | Admit: 2024-11-01 | Discharge: 2024-11-01 | Disposition: A | Source: Ambulatory Visit | Attending: Surgery | Admitting: Surgery

## 2024-11-01 ENCOUNTER — Other Ambulatory Visit (HOSPITAL_COMMUNITY): Payer: Self-pay | Admitting: Physician Assistant

## 2024-11-01 DIAGNOSIS — T148XXA Other injury of unspecified body region, initial encounter: Secondary | ICD-10-CM | POA: Insufficient documentation

## 2024-11-01 LAB — VAS US ABI WITH/WO TBI
Left ABI: 1.42
Right ABI: 1.17

## 2024-12-09 ENCOUNTER — Encounter (INDEPENDENT_AMBULATORY_CARE_PROVIDER_SITE_OTHER): Admitting: Ophthalmology

## 2025-01-10 ENCOUNTER — Ambulatory Visit: Admitting: Podiatry
